# Patient Record
Sex: Female | Born: 1975 | Race: White | Hispanic: No | Smoking: Current every day smoker
Health system: Southern US, Community
[De-identification: ages and names within clinical notes are randomized; demographics above are authoritative.]

## PROBLEM LIST (undated history)

## (undated) DIAGNOSIS — E7800 Pure hypercholesterolemia, unspecified: Secondary | ICD-10-CM

## (undated) DIAGNOSIS — F419 Anxiety disorder, unspecified: Secondary | ICD-10-CM

## (undated) DIAGNOSIS — F32A Depression, unspecified: Secondary | ICD-10-CM

## (undated) DIAGNOSIS — C539 Malignant neoplasm of cervix uteri, unspecified: Secondary | ICD-10-CM

## (undated) DIAGNOSIS — Z87442 Personal history of urinary calculi: Secondary | ICD-10-CM

## (undated) DIAGNOSIS — T7840XA Allergy, unspecified, initial encounter: Secondary | ICD-10-CM

## (undated) HISTORY — PX: BREAST BIOPSY: SHX20

## (undated) HISTORY — PX: ABDOMINAL HYSTERECTOMY: SHX81

## (undated) HISTORY — PX: ECTOPIC PREGNANCY SURGERY: SHX613

## (undated) HISTORY — PX: SPINE SURGERY: SHX786

## (undated) HISTORY — PX: TUBAL LIGATION: SHX77

## (undated) HISTORY — PX: LEEP: SHX91

---
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
## 1997-08-07 ENCOUNTER — Inpatient Hospital Stay (HOSPITAL_COMMUNITY): Admission: AD | Admit: 1997-08-07 | Discharge: 1997-08-07 | Payer: Self-pay | Admitting: *Deleted

## 1997-09-30 ENCOUNTER — Inpatient Hospital Stay (HOSPITAL_COMMUNITY): Admission: AD | Admit: 1997-09-30 | Discharge: 1997-10-02 | Payer: Self-pay | Admitting: *Deleted

## 1997-11-12 ENCOUNTER — Encounter: Admission: RE | Admit: 1997-11-12 | Discharge: 1997-11-12 | Payer: Self-pay | Admitting: Family Medicine

## 1997-11-29 ENCOUNTER — Encounter: Admission: RE | Admit: 1997-11-29 | Discharge: 1997-11-29 | Payer: Self-pay | Admitting: Family Medicine

## 1998-01-30 ENCOUNTER — Encounter: Admission: RE | Admit: 1998-01-30 | Discharge: 1998-01-30 | Payer: Self-pay | Admitting: Family Medicine

## 1998-04-02 ENCOUNTER — Other Ambulatory Visit: Admission: RE | Admit: 1998-04-02 | Discharge: 1998-04-02 | Payer: Self-pay | Admitting: *Deleted

## 1998-04-02 ENCOUNTER — Encounter: Admission: RE | Admit: 1998-04-02 | Discharge: 1998-04-02 | Payer: Self-pay | Admitting: Sports Medicine

## 2005-07-06 HISTORY — PX: DIAGNOSTIC LAPAROSCOPY: SUR761

## 2006-02-19 ENCOUNTER — Emergency Department (HOSPITAL_COMMUNITY): Admission: EM | Admit: 2006-02-19 | Discharge: 2006-02-19 | Payer: Self-pay

## 2006-03-12 ENCOUNTER — Ambulatory Visit (HOSPITAL_COMMUNITY): Admission: AD | Admit: 2006-03-12 | Discharge: 2006-03-12 | Payer: Self-pay | Admitting: Obstetrics and Gynecology

## 2006-03-12 ENCOUNTER — Encounter: Payer: Self-pay | Admitting: Emergency Medicine

## 2006-03-12 ENCOUNTER — Encounter (INDEPENDENT_AMBULATORY_CARE_PROVIDER_SITE_OTHER): Payer: Self-pay | Admitting: Specialist

## 2007-11-26 ENCOUNTER — Inpatient Hospital Stay (HOSPITAL_COMMUNITY): Admission: AD | Admit: 2007-11-26 | Discharge: 2007-11-26 | Payer: Self-pay | Admitting: Obstetrics & Gynecology

## 2008-06-07 ENCOUNTER — Ambulatory Visit (HOSPITAL_COMMUNITY): Admission: RE | Admit: 2008-06-07 | Discharge: 2008-06-07 | Payer: Self-pay | Admitting: Obstetrics and Gynecology

## 2008-07-03 ENCOUNTER — Encounter (INDEPENDENT_AMBULATORY_CARE_PROVIDER_SITE_OTHER): Payer: Self-pay | Admitting: Obstetrics and Gynecology

## 2008-07-03 ENCOUNTER — Inpatient Hospital Stay (HOSPITAL_COMMUNITY): Admission: AD | Admit: 2008-07-03 | Discharge: 2008-07-04 | Payer: Self-pay | Admitting: Obstetrics and Gynecology

## 2008-07-06 HISTORY — PX: ESSURE TUBAL LIGATION: SUR464

## 2008-09-29 ENCOUNTER — Emergency Department (HOSPITAL_COMMUNITY): Admission: EM | Admit: 2008-09-29 | Discharge: 2008-09-29 | Payer: Self-pay | Admitting: Family Medicine

## 2009-01-10 ENCOUNTER — Inpatient Hospital Stay (HOSPITAL_COMMUNITY): Admission: AD | Admit: 2009-01-10 | Discharge: 2009-01-11 | Payer: Self-pay | Admitting: Obstetrics and Gynecology

## 2010-02-26 ENCOUNTER — Other Ambulatory Visit: Admission: RE | Admit: 2010-02-26 | Discharge: 2010-02-26 | Payer: Self-pay | Admitting: Obstetrics and Gynecology

## 2010-02-26 ENCOUNTER — Ambulatory Visit: Payer: Self-pay | Admitting: Obstetrics & Gynecology

## 2010-03-19 ENCOUNTER — Ambulatory Visit: Payer: Self-pay | Admitting: Obstetrics & Gynecology

## 2010-04-09 ENCOUNTER — Other Ambulatory Visit: Admission: RE | Admit: 2010-04-09 | Discharge: 2010-04-09 | Payer: Self-pay | Admitting: Obstetrics and Gynecology

## 2010-04-09 ENCOUNTER — Ambulatory Visit: Payer: Self-pay | Admitting: Obstetrics & Gynecology

## 2010-04-23 ENCOUNTER — Emergency Department (HOSPITAL_COMMUNITY): Admission: EM | Admit: 2010-04-23 | Discharge: 2010-04-23 | Payer: Self-pay | Admitting: Family Medicine

## 2010-04-30 ENCOUNTER — Ambulatory Visit: Payer: Self-pay | Admitting: Obstetrics and Gynecology

## 2010-05-11 ENCOUNTER — Emergency Department (HOSPITAL_COMMUNITY): Admission: EM | Admit: 2010-05-11 | Discharge: 2010-05-11 | Payer: Self-pay | Admitting: Emergency Medicine

## 2010-06-11 ENCOUNTER — Other Ambulatory Visit
Admission: RE | Admit: 2010-06-11 | Discharge: 2010-06-11 | Payer: Self-pay | Source: Home / Self Care | Admitting: Obstetrics and Gynecology

## 2010-06-11 ENCOUNTER — Ambulatory Visit: Payer: Self-pay | Admitting: Obstetrics and Gynecology

## 2010-07-09 ENCOUNTER — Ambulatory Visit: Admit: 2010-07-09 | Payer: Self-pay | Admitting: Obstetrics and Gynecology

## 2010-09-15 LAB — POCT PREGNANCY, URINE: Preg Test, Ur: NEGATIVE

## 2010-09-16 LAB — DIFFERENTIAL
Basophils Absolute: 0.1 10*3/uL (ref 0.0–0.1)
Basophils Relative: 1 % (ref 0–1)
Eosinophils Relative: 1 % (ref 0–5)
Lymphocytes Relative: 36 % (ref 12–46)
Neutro Abs: 4.9 10*3/uL (ref 1.7–7.7)

## 2010-09-16 LAB — COMPREHENSIVE METABOLIC PANEL
AST: 35 U/L (ref 0–37)
Alkaline Phosphatase: 86 U/L (ref 39–117)
BUN: 8 mg/dL (ref 6–23)
CO2: 25 mEq/L (ref 19–32)
Chloride: 107 mEq/L (ref 96–112)
Creatinine, Ser: 0.96 mg/dL (ref 0.4–1.2)
GFR calc non Af Amer: >60 mL/min (ref >60)
Potassium: 3.9 mEq/L (ref 3.5–5.1)
Total Bilirubin: 0.4 mg/dL (ref 0.3–1.2)

## 2010-09-16 LAB — CBC
HCT: 40.4 % (ref 36.0–46.0)
Hemoglobin: 13.7 g/dL (ref 12.0–15.0)
MCH: 29.5 pg (ref 26.0–34.0)
MCV: 87.3 fL (ref 78.0–100.0)
RBC: 4.63 MIL/uL (ref 3.87–5.11)
WBC: 9.2 10*3/uL (ref 4.0–10.5)

## 2010-09-16 LAB — URINALYSIS, ROUTINE W REFLEX MICROSCOPIC
Bilirubin Urine: NEGATIVE
Ketones, ur: NEGATIVE mg/dL
Nitrite: NEGATIVE
Protein, ur: NEGATIVE mg/dL
Specific Gravity, Urine: >1.03 — ABNORMAL HIGH (ref 1.005–1.030)
Urobilinogen, UA: 0.2 mg/dL (ref 0.0–1.0)

## 2010-09-16 LAB — URINE CULTURE: Colony Count: 25000

## 2010-09-16 LAB — PREGNANCY, URINE: Preg Test, Ur: NEGATIVE

## 2010-09-17 LAB — POCT RAPID STREP A (OFFICE): Streptococcus, Group A Screen (Direct): POSITIVE — AB

## 2010-10-12 LAB — CBC
Hemoglobin: 15.6 g/dL — ABNORMAL HIGH (ref 12.0–15.0)
MCHC: 34.5 g/dL (ref 30.0–36.0)
RBC: 5.09 MIL/uL (ref 3.87–5.11)
WBC: 11.7 10*3/uL — ABNORMAL HIGH (ref 4.0–10.5)

## 2010-10-12 LAB — WET PREP, GENITAL
Clue Cells Wet Prep HPF POC: NONE SEEN
Trich, Wet Prep: NONE SEEN
Yeast Wet Prep HPF POC: NONE SEEN

## 2010-10-12 LAB — POCT PREGNANCY, URINE: Preg Test, Ur: NEGATIVE

## 2010-10-12 LAB — GC/CHLAMYDIA PROBE AMP, GENITAL
Chlamydia, DNA Probe: NEGATIVE
GC Probe Amp, Genital: NEGATIVE

## 2010-11-21 NOTE — Op Note (Signed)
April Ayala, MARENGO             ACCOUNT NO.:  [id number]   MEDICAL RECORD NO.:  [id number]          PATIENT TYPE:  OIB   LOCATION:  9399                          FACILITY:  [facility]   PHYSICIAN:  Osborn Coho, M.D.   DATE OF BIRTH:  October 25, 1975   DATE OF PROCEDURE:  03/12/2006  DATE OF DISCHARGE:                                 OPERATIVE REPORT   PREOPERATIVE DIAGNOSIS:  Ruptured ectopic.   POSTOPERATIVE DIAGNOSIS:  Ruptured ectopic.   PROCEDURE:  Laparoscopic left salpingectomy.   ANESTHESIA:  General.   ATTENDING:  Dr. Osborn Coho.   FLUID:  3000 mL.   ESTIMATED BLOOD LOSS:  1000 mL of hemoperitoneum with clots and blood.   URINE OUTPUT:  450 mL.   COMPLICATIONS:  None.   SPECIMENS TO PATHOLOGY:  Left fallopian tube and ectopic pregnancy.   FINDINGS:  1000 mL hemoperitoneum.   PROCEDURE:  The patient is taken to the operating room after risks,  benefits, alternatives reviewed with the patient.  The patient verbalized  understanding consent signed and witnessed.  The patient was placed under  general anesthesia and prepped and draped in the normal sterile fashion.  A  bivalve speculum placed in the patient's vagina and Hulka tenaculum placed  for intrauterine manipulation.  Attention was then turned to the abdomen  where a 10 mm incision was made at the umbilicus.  The Veress needle was  passed into the intra-abdominal cavity and pneumoperitoneum achieved.  The  Veress needle was removed and 10 mm trocar advanced to the intra-abdominal  cavity without difficulty and laparoscope introduced.  A large  hemoperitoneum noted.  A left ectopic with ruptured left tube noted.  The  patient was placed in Trendelenburg and a left lower quadrant 10 mm incision  was made and 10 mm trocar advanced into the intra-abdominal cavity under  direct visualization.  The same was done on the right lower quadrant,  however a 5 mm incision and trocar was used.  The ectopic was elevated and  ligated with two Endoloops of 0 Vicryl.  The ectopic was then excised and  removed via the 10 mm umbilical port.  Copious irrigation was performed and  clot removed.  The patient was placed in reverse Trendelenburg as well in  order to remove the blood via suction irrigation.  There was good hemostasis  of the fallopian tube, however the fimbria was separated from the stump  where the ectopic pregnancy was removed secondary to rupture of the tube at  this area.  The fimbriated end was then ligated using a 0 Vicryl Endoloop  incorporating some of the mesosalpinx in order to prevent bleeding.  The  pneumoperitoneum was relieved some in order to assess the pedicles which  were noted to be hemostatic.  Copious irrigation was performed once again  and hemostasis of the pedicles was noted.  The left lower quadrant port and  right lower quadrant ports were removed under direct visualization while  pneumoperitoneum was being relieved.  The 10 mm umbilical port was removed  under direct visualization as well.  The fascia at the 10 mm  port sites was  repaired with 0 Vicryl via a figure-of-eight stitch.  The skin was repaired  at the 10 mm skin incisions with 3-0 Monocryl via a subcuticular stitch and  the 5 mm incision was repaired with Dermabond.  The Hulka tenaculum was  removed.  Sponge, lap and needle count was correct.  The patient tolerated  procedure well and is currently awaiting transfer to the recovery room in  good condition.      Osborn Coho, M.D.  Electronically Signed     AR/MEDQ  D:  03/12/2006  T:  03/12/2006  Job:  [id number]

## 2010-11-21 NOTE — Discharge Summary (Signed)
[name], April Ayala           ACCOUNT NO.:  [id number]   MEDICAL RECORD NO.:  [id number]          PATIENT TYPE:  INP   LOCATION:  9141                          FACILITY:  [facility]   PHYSICIAN:  Huel Cote, M.D. DATE OF BIRTH:  1976-05-21   DATE OF ADMISSION:  07/03/2008  DATE OF DISCHARGE:  07/04/2008                               DISCHARGE SUMMARY   DISCHARGE DIAGNOSES:  1. Term pregnancy at 38 weeks, delivered.  2. Status post precipitous vaginal delivery.   DISCHARGE MEDICATIONS:  1. Motrin 600 mg p.o. every 6 hours.  2. Percocet 150 tablets p.o. every 4 hours p.r.n.   HOSPITAL COURSE:  The patient is to follow up in 6 weeks for her routine  postpartum exam.   HOSPITAL COURSE:  The patient is a 35 year old G6, P3-0-2-3 who was  admitted at 44 weeks' gestation with spontaneous rupture of membranes  and active labor.  When she arrived on admission, she was 4-5 cm  dilated, received her epidural, and progressed quickly to complete.  Prenatal care was complicated by polyhydramnios, has no clear etiology.  She also had recurrent UTIs with Klebsiella.   PRENATAL LABS:  An O negative, antibody negative, RPR nonreactive,  rubella immune, hepatitis B surface antigen negative, HIV negative, GC  negative, Chlamydia negative, group B strep negative, 1-hour Glucola  124.  She declined genetic screening.   PAST OBSTETRICAL HISTORY:  In 1999, she had a vaginal delivery of 6-  pound 10-ounce infant.  In 2001, she had a vaginal delivery of an 8-  pound 4-ounce infant and in 2003, she had a vaginal delivery of an 8-  pound 6-ounce infant.  In 2000, she suffered a miscarriage and in 2008,  had a left ectopic pregnancy.   PAST GYN HISTORY:  She had a history of a salpingectomy on the left in  2008 for her ectopic.   PAST SURGICAL HISTORY:  She had a history of a salpingectomy on the left  in 2008 for her ectopic.   PAST MEDICAL HISTORY:  None.   ALLERGIES:  None.   MEDICATIONS:   Prenatal vitamins only.   PHYSICAL EXAMINATION:  VITAL SIGNS:  On admission, she was afebrile with  stable vital signs.  PELVIC:  Fetal heart rate had mild variable decelerations, but was  overall reassuring.   Shortly after admission, she was found to be completely dilated and was  prepped and pushed well, bringing the vertex to the perineum.  Over  several contractions, she delivered the vertex with some effort.  A  moderate shoulder dystocia was encountered and required McRoberts and  posterior axillary lift and suprapubic pressure.  She had a viable  female infant, Apgars were 3 and 9, weight was 7 pounds 9 ounces.  The  baby was very floppy at delivery and the shoulder dystocia lasted  approximately 1 minute.  Cord pH was 7.11.  NICU was called and Dr.  Noreene Filbert evaluated the baby and took observation given mild retraction.  The baby was moving both arms well and the placenta was delivered  spontaneously.  There were no lacerations of the vagina.  The  patient  then was admitted for routine postpartum care.  Her hemoglobin was 9.7.  The baby did quite well and was able to be discharged home on the day  following birth.  Therefore, the patient requested early discharge and  was allowed this with followup in the office in 6 weeks.      Huel Cote, M.D.  Electronically Signed     KR/MEDQ  D:  08/04/2008  T:  08/04/2008  Job:  [id number]

## 2011-02-10 DIAGNOSIS — T169XXA Foreign body in ear, unspecified ear, initial encounter: Secondary | ICD-10-CM | POA: Insufficient documentation

## 2011-02-10 DIAGNOSIS — IMO0002 Reserved for concepts with insufficient information to code with codable children: Secondary | ICD-10-CM | POA: Insufficient documentation

## 2011-02-10 DIAGNOSIS — F17200 Nicotine dependence, unspecified, uncomplicated: Secondary | ICD-10-CM | POA: Insufficient documentation

## 2011-02-11 ENCOUNTER — Encounter: Payer: Self-pay | Admitting: *Deleted

## 2011-02-11 ENCOUNTER — Emergency Department (HOSPITAL_COMMUNITY)
Admission: EM | Admit: 2011-02-11 | Discharge: 2011-02-11 | Disposition: A | Discharge status: Home / Self Care | Payer: PRIVATE HEALTH INSURANCE | Attending: Emergency Medicine | Admitting: Emergency Medicine

## 2011-02-11 DIAGNOSIS — T169XXA Foreign body in ear, unspecified ear, initial encounter: Secondary | ICD-10-CM

## 2011-02-11 MED ORDER — ANTIPYRINE-BENZOCAINE 5.4-1.4 % OT SOLN
3.0000 [drp] | Freq: Once | OTIC | Status: AC
  Administered 2011-02-11: 4 [drp] via OTIC
  Filled 2011-02-11: qty 10

## 2011-02-11 MED ORDER — LIDOCAINE HCL (PF) 1 % IJ SOLN
5.0000 mL | Freq: Once | INTRAMUSCULAR | Status: AC
  Administered 2011-02-11: 5 mL
  Filled 2011-02-11 (×2): qty 5

## 2011-02-11 MED ORDER — CIPROFLOXACIN-DEXAMETHASONE 0.3-0.1 % OT SUSP: 4.0000 [drp] | Freq: Two times a day (BID) | OTIC | Status: AC

## 2011-02-11 NOTE — ED Notes (Signed)
Foreign body to right ear, moth in ear, attempted to remove moth but unable to

## 2011-02-11 NOTE — ED Notes (Signed)
Pt right ear flushed no return of particles resembling a bug noted pt stating pain in ear with continued flushing. Flushing stopped dr notified ear canal reddened.

## 2011-02-11 NOTE — ED Provider Notes (Signed)
History     CSN: [id number] Arrival date & time: 02/11/2011  1:15 AM  Chief Complaint  Patient presents with  . Foreign Body in Ear   HPI Comments: He had a bug fly into her right ear approximately 3 hours prior to arrival. The symptoms are constant, gradually improving, not associated with headache. She does have mild earache and vertigo. She tried to get the decrease round with a pair of forceps prior to arrival.  Patient is a 35 y.o. female presenting with foreign body in ear. The history is provided by the patient.  Foreign Body in Ear This is a new problem. The current episode started 1 to 2 hours ago. The problem occurs constantly. The problem has been gradually improving. Pertinent negatives include no headaches.    History reviewed. No pertinent past medical history.  Past Surgical History  Procedure Date  . Ectopic pregnancy surgery   . Essure tubal ligation   . Leep     History reviewed. No pertinent family history.  History  Substance Use Topics  . Smoking status: Current Everyday Smoker -- 0.5 packs/day    Types: Cigarettes  . Smokeless tobacco: Not on file  . Alcohol Use: No    OB History    Grav Para Term Preterm Abortions TAB SAB Ect Mult Living                  Review of Systems  HENT: Positive for ear pain.   Neurological: Positive for dizziness. Negative for light-headedness and headaches.    Physical Exam  BP 124/89  Pulse 87  Temp(Src) 98.4 F (36.9 C) (Oral)  Resp 18  Ht 5\' 3"  (1.6 m)  Wt 189 lb (85.73 kg)  BMI 33.48 kg/m2  SpO2 100%  LMP 01/30/2011  Physical Exam  Constitutional: She appears well-developed and well-nourished. No distress.  HENT:  Head: Normocephalic and atraumatic.  Left Ear: External ear normal.  Mouth/Throat: Oropharynx is clear and moist. No oropharyngeal exudate.       Right external ear containing cerumen and an insect.  Eyes: Conjunctivae are normal. No scleral icterus.  Cardiovascular: Normal rate and normal  heart sounds.   Pulmonary/Chest: Effort normal and breath sounds normal.  Musculoskeletal: Normal range of motion. She exhibits no edema and no tenderness.  Neurological: She is alert.  Skin: Skin is warm and dry. No rash noted. She is not diaphoretic.    ED Course  FOREIGN BODY REMOVAL Date/Time: 02/11/2011 4:00 AM Performed by: Eber Hong D Authorized by: Eber Hong D Consent: Verbal consent obtained. Risks and benefits: risks, benefits and alternatives were discussed Consent given by: patient Patient understanding: patient states understanding of the procedure being performed Patient identity confirmed: verbally with patient Body area: ear Location details: right ear Local anesthetic: topical lidocaine. Patient sedated: no Patient restrained: no Localization method: magnification Removal mechanism: alligator forceps Complexity: simple 1 objects recovered. Objects recovered: insect Post-procedure assessment: foreign body removed Patient tolerance: Patient tolerated the procedure well with no immediate complications.    MDM Patient will require manual extraction of insect. Topical lidocaine applied to the ear first. See note to follow.  Topical anesthetic and topical antibiotic drops given for home as patient did have small amount of bleeding with the procedure.  Vida Roller, MD 02/11/11 [phone]

## 2011-03-25 ENCOUNTER — Emergency Department (HOSPITAL_COMMUNITY)
Admission: EM | Admit: 2011-03-25 | Discharge: 2011-03-25 | Disposition: A | Discharge status: Home / Self Care | Payer: PRIVATE HEALTH INSURANCE | Attending: Emergency Medicine | Admitting: Emergency Medicine

## 2011-03-25 ENCOUNTER — Other Ambulatory Visit: Payer: Self-pay

## 2011-03-25 ENCOUNTER — Emergency Department (HOSPITAL_COMMUNITY): Payer: PRIVATE HEALTH INSURANCE

## 2011-03-25 ENCOUNTER — Encounter (HOSPITAL_COMMUNITY): Payer: Self-pay | Admitting: *Deleted

## 2011-03-25 DIAGNOSIS — F17200 Nicotine dependence, unspecified, uncomplicated: Secondary | ICD-10-CM | POA: Insufficient documentation

## 2011-03-25 DIAGNOSIS — N390 Urinary tract infection, site not specified: Secondary | ICD-10-CM

## 2011-03-25 DIAGNOSIS — R109 Unspecified abdominal pain: Secondary | ICD-10-CM | POA: Insufficient documentation

## 2011-03-25 DIAGNOSIS — R0789 Other chest pain: Secondary | ICD-10-CM | POA: Insufficient documentation

## 2011-03-25 LAB — URINE MICROSCOPIC-ADD ON

## 2011-03-25 LAB — URINALYSIS, ROUTINE W REFLEX MICROSCOPIC
Hgb urine dipstick: NEGATIVE
Nitrite: NEGATIVE
Specific Gravity, Urine: 1.01 (ref 1.005–1.030)
Urobilinogen, UA: 0.2 mg/dL (ref 0.0–1.0)
pH: 7 (ref 5.0–8.0)

## 2011-03-25 LAB — DIFFERENTIAL
Lymphocytes Relative: 34 % (ref 12–46)
Lymphs Abs: 4.7 10*3/uL — ABNORMAL HIGH (ref 0.7–4.0)
Neutrophils Relative %: 57 % (ref 43–77)

## 2011-03-25 LAB — BASIC METABOLIC PANEL
CO2: 29 mEq/L (ref 19–32)
Glucose, Bld: 107 mg/dL — ABNORMAL HIGH (ref 70–99)
Potassium: 3.8 mEq/L (ref 3.5–5.1)
Sodium: 139 mEq/L (ref 135–145)

## 2011-03-25 LAB — CBC
Hemoglobin: 14.1 g/dL (ref 12.0–15.0)
MCV: 86.9 fL (ref 78.0–100.0)
Platelets: 360 10*3/uL (ref 150–400)
RBC: 4.81 MIL/uL (ref 3.87–5.11)
WBC: 13.8 10*3/uL — ABNORMAL HIGH (ref 4.0–10.5)

## 2011-03-25 LAB — CARDIAC PANEL(CRET KIN+CKTOT+MB+TROPI)
CK, MB: 1.7 ng/mL (ref 0.3–4.0)
Relative Index: INVALID (ref 0.0–2.5)
Troponin I: <0.3 ng/mL (ref <0.30)

## 2011-03-25 MED ORDER — CIPROFLOXACIN HCL 250 MG PO TABS
500.0000 mg | ORAL_TABLET | Freq: Once | ORAL | Status: AC
  Administered 2011-03-25: 500 mg via ORAL
  Filled 2011-03-25: qty 2

## 2011-03-25 MED ORDER — CIPROFLOXACIN HCL 500 MG PO TABS: 500.0000 mg | ORAL_TABLET | Freq: Two times a day (BID) | ORAL | Status: AC

## 2011-03-25 MED ORDER — SODIUM CHLORIDE 0.9 % IV SOLN
Freq: Once | INTRAVENOUS | Status: AC
  Administered 2011-03-25: 05:00:00 via INTRAVENOUS

## 2011-03-25 NOTE — ED Provider Notes (Addendum)
History     CSN: [id number] Arrival date & time: 03/25/2011  4:09 AM   Chief Complaint  Patient presents with  . Abdominal Pain  . Chest Pain     (Include location/radiation/quality/duration/timing/severity/associated sxs/prior treatment) HPI Comments: Seen 24  Patient is a 35 y.o. female presenting with abdominal pain and chest pain. The history is provided by the patient.  Abdominal Pain The primary symptoms of the illness include abdominal pain. The primary symptoms of the illness do not include nausea, vomiting or dysuria. Primary symptoms comment: Began with abdominal pain yesterday.Pain has been in upper abdomen as well as lower abdomen.  The onset of the illness was sudden. The problem has not changed since onset. The patient states that she believes she is currently not pregnant. The patient has not had a change in bowel habit. Symptoms associated with the illness do not include chills, anorexia, constipation, urgency, hematuria, frequency or back pain. Associated symptoms comments: Has had left sided chest pain which radiates down her arm.  Chest Pain Primary symptoms include abdominal pain. Pertinent negatives for primary symptoms include no nausea and no vomiting. Primary symptoms comment: Began with abdominal pain yesterday.Pain has been in upper abdomen as well as lower abdomen.       History reviewed. No pertinent past medical history.   Past Surgical History  Procedure Date  . Ectopic pregnancy surgery   . Essure tubal ligation   . Leep     History reviewed. No pertinent family history.  History  Substance Use Topics  . Smoking status: Current Everyday Smoker -- 0.5 packs/day    Types: Cigarettes  . Smokeless tobacco: Not on file  . Alcohol Use: No    OB History    Grav Para Term Preterm Abortions TAB SAB Ect Mult Living                  Review of Systems  Constitutional: Negative for chills.  Cardiovascular: Positive for chest pain.    Gastrointestinal: Positive for abdominal pain. Negative for nausea, vomiting, constipation and anorexia.  Genitourinary: Negative for dysuria, urgency, frequency and hematuria.  Musculoskeletal: Negative for back pain.  All other systems reviewed and are negative.    Allergies  Review of patient's allergies indicates no known allergies.  Home Medications   Current Outpatient Rx  Name Route Sig Dispense Refill  . OXYCODONE-ACETAMINOPHEN 5-325 MG PO TABS Oral Take 1 tablet by mouth every 4 (four) hours as needed.        Physical Exam    BP 122/81  Pulse 112  Temp(Src) 98.9 F (37.2 C) (Oral)  Resp 20  Ht 5\' 3"  (1.6 m)  Wt 180 lb (81.647 kg)  BMI 31.89 kg/m2  SpO2 100%  LMP 03/16/2011  Physical Exam  Nursing note and vitals reviewed. Constitutional: She is oriented to person, place, and time. She appears well-developed and well-nourished. No distress.  HENT:  Head: Normocephalic and atraumatic.  Eyes: EOM are normal.  Neck: Normal range of motion. Neck supple.  Cardiovascular: Normal rate, normal heart sounds and intact distal pulses.   Pulmonary/Chest: Effort normal and breath sounds normal. She exhibits no tenderness.  Abdominal: Soft. There is no rebound and no guarding.       Mild suprapubic discomfort with palpation  Musculoskeletal: Normal range of motion.       FROM to left shoulder without pain.  Neurological: She is alert and oriented to person, place, and time.  Skin: Skin is warm and dry.  ED Course  Procedures  Results for orders placed during the hospital encounter of 03/25/11  URINALYSIS, ROUTINE W REFLEX MICROSCOPIC      Component Value Range   Color, Urine YELLOW  YELLOW    Appearance CLOUDY (*) CLEAR    Specific Gravity, Urine 1.010  1.005 - 1.030    pH 7.0  5.0 - 8.0    Glucose, UA NEGATIVE  NEGATIVE (mg/dL)   Hgb urine dipstick NEGATIVE  NEGATIVE    Bilirubin Urine NEGATIVE  NEGATIVE    Ketones, ur NEGATIVE  NEGATIVE (mg/dL)   Protein,  ur NEGATIVE  NEGATIVE (mg/dL)   Urobilinogen, UA 0.2  0.0 - 1.0 (mg/dL)   Nitrite NEGATIVE  NEGATIVE    Leukocytes, UA LARGE (*) NEGATIVE   CBC      Component Value Range   WBC 13.8 (*) 4.0 - 10.5 (K/uL)   RBC 4.81  3.87 - 5.11 (MIL/uL)   Hemoglobin 14.1  12.0 - 15.0 (g/dL)   HCT 16.1  09.6 - 04.5 (%)   MCV 86.9  78.0 - 100.0 (fL)   MCH 29.3  26.0 - 34.0 (pg)   MCHC 33.7  30.0 - 36.0 (g/dL)   RDW 40.9  81.1 - 91.4 (%)   Platelets 360  150 - 400 (K/uL)  DIFFERENTIAL      Component Value Range   Neutrophils Relative 57  43 - 77 (%)   Neutro Abs 7.8 (*) 1.7 - 7.7 (K/uL)   Lymphocytes Relative 34  12 - 46 (%)   Lymphs Abs 4.7 (*) 0.7 - 4.0 (K/uL)   Monocytes Relative 6  3 - 12 (%)   Monocytes Absolute 0.9  0.1 - 1.0 (K/uL)   Eosinophils Relative 2  0 - 5 (%)   Eosinophils Absolute 0.3  0.0 - 0.7 (K/uL)   Basophils Relative 0  0 - 1 (%)   Basophils Absolute 0.1  0.0 - 0.1 (K/uL)  BASIC METABOLIC PANEL      Component Value Range   Sodium 139  135 - 145 (mEq/L)   Potassium 3.8  3.5 - 5.1 (mEq/L)   Chloride 99  96 - 112 (mEq/L)   CO2 29  19 - 32 (mEq/L)   Glucose, Bld 107 (*) 70 - 99 (mg/dL)   BUN 8  6 - 23 (mg/dL)   Creatinine, Ser 7.82  0.50 - 1.10 (mg/dL)   Calcium 9.6  8.4 - 95.6 (mg/dL)   GFR calc non Af Amer >60  >60 (mL/min)   GFR calc Af Amer >60  >60 (mL/min)  CARDIAC PANEL(CRET KIN+CKTOT+MB+TROPI)      Component Value Range   Total CK 53  7 - 177 (U/L)   CK, MB 1.7  0.3 - 4.0 (ng/mL)   Troponin I <0.30  <0.30 (ng/mL)   Relative Index RELATIVE INDEX IS INVALID  0.0 - 2.5   URINE MICROSCOPIC-ADD ON      Component Value Range   Squamous Epithelial / LPF MANY (*) RARE    WBC, UA 21-50  <3 (WBC/hpf)   RBC / HPF 11-20  <3 (RBC/hpf)   Bacteria, UA MANY (*) RARE    Dg Chest 2 View  03/25/2011  *RADIOLOGY REPORT*  Clinical Data: Right anterior chest pain  CHEST - 2 VIEW  Comparison: None.  Findings: Lungs are clear. No pleural effusion or pneumothorax. The  cardiomediastinal contours are within normal limits. The visualized bones and soft tissues are without significant appreciable abnormality.  IMPRESSION: No acute cardiopulmonary process.  Original Report Authenticated By: Waneta Martins, M.D.   Patient with both abdominal pain and chest discomfort. No fevers, chills, shortness of breath, nausea, vomiting. Labs significant for UTI. Chest xray, EKG and cardiac markers negative. Patient informed of clinical course, understand medical decision-making process, and agree with plan.Pt stable in ED with no significant deterioration in condition. MDM Reviewed: nursing note and vitals Interpretation: labs, x-ray and ECG   Date: 03/25/2011  0419  Rate:99  Rhythm: normal sinus rhythm  QRS Axis: normal  Intervals: normal  ST/T Wave abnormalities: normal  Conduction Disutrbances:none  Narrative Interpretation:   Old EKG Reviewed: none available         Nicoletta Dress. Colon Branch, MD 03/25/11 [id number]  Nicoletta Dress. Colon Branch, MD 03/25/11 [phone]

## 2011-03-25 NOTE — ED Notes (Signed)
Pt reports upper abdominal pain & left sided chest pain. Chest pain comes & goes but will radiate to left arm. Abdominal pain more constate & a stabbing pain.

## 2011-03-25 NOTE — ED Notes (Signed)
Pt left er stating no needs 

## 2011-04-01 LAB — URINALYSIS, ROUTINE W REFLEX MICROSCOPIC
Bilirubin Urine: NEGATIVE
Glucose, UA: NEGATIVE
Ketones, ur: 15 — AB
pH: 7

## 2011-04-01 LAB — HCG, QUANTITATIVE, PREGNANCY: hCG, Beta Chain, Quant, S: 49907 — ABNORMAL HIGH

## 2011-04-01 LAB — WET PREP, GENITAL
Clue Cells Wet Prep HPF POC: NONE SEEN
Yeast Wet Prep HPF POC: NONE SEEN

## 2011-04-06 DIAGNOSIS — C539 Malignant neoplasm of cervix uteri, unspecified: Secondary | ICD-10-CM

## 2011-04-06 HISTORY — DX: Malignant neoplasm of cervix uteri, unspecified: C53.9

## 2011-04-10 LAB — CBC
HCT: 29 % — ABNORMAL LOW (ref 36.0–46.0)
MCHC: 34.3 g/dL (ref 30.0–36.0)
MCV: 91.8 fL (ref 78.0–100.0)
MCV: 94 fL (ref 78.0–100.0)
Platelets: 233 10*3/uL (ref 150–400)
RBC: 4.09 MIL/uL (ref 3.87–5.11)
RDW: 14.6 % (ref 11.5–15.5)

## 2011-04-10 LAB — RPR: RPR Ser Ql: NONREACTIVE

## 2011-04-10 LAB — RH IMMUNE GLOB WKUP(>/=20WKS)(NOT WOMEN'S HOSP)

## 2011-05-15 ENCOUNTER — Other Ambulatory Visit (HOSPITAL_COMMUNITY): Payer: Self-pay | Admitting: Family Medicine

## 2011-05-15 DIAGNOSIS — IMO0002 Reserved for concepts with insufficient information to code with codable children: Secondary | ICD-10-CM

## 2011-05-21 ENCOUNTER — Other Ambulatory Visit (HOSPITAL_COMMUNITY): Payer: Self-pay | Admitting: Family Medicine

## 2011-05-21 ENCOUNTER — Ambulatory Visit (HOSPITAL_COMMUNITY)
Admission: RE | Admit: 2011-05-21 | Discharge: 2011-05-21 | Disposition: A | Discharge status: Home / Self Care | Payer: Self-pay | Source: Ambulatory Visit | Attending: Family Medicine | Admitting: Family Medicine

## 2011-05-21 ENCOUNTER — Ambulatory Visit (HOSPITAL_COMMUNITY): Payer: Self-pay

## 2011-05-21 DIAGNOSIS — R9389 Abnormal findings on diagnostic imaging of other specified body structures: Secondary | ICD-10-CM | POA: Insufficient documentation

## 2011-05-21 DIAGNOSIS — R87619 Unspecified abnormal cytological findings in specimens from cervix uteri: Secondary | ICD-10-CM | POA: Insufficient documentation

## 2011-05-21 DIAGNOSIS — IMO0002 Reserved for concepts with insufficient information to code with codable children: Secondary | ICD-10-CM

## 2011-07-21 ENCOUNTER — Other Ambulatory Visit (HOSPITAL_COMMUNITY)
Admission: RE | Admit: 2011-07-21 | Discharge: 2011-07-21 | Disposition: A | Discharge status: Home / Self Care | Payer: Medicaid Other | Source: Ambulatory Visit | Attending: Unknown Physician Specialty | Admitting: Unknown Physician Specialty

## 2011-07-21 ENCOUNTER — Other Ambulatory Visit: Payer: Self-pay | Admitting: Nurse Practitioner

## 2011-07-21 ENCOUNTER — Other Ambulatory Visit (HOSPITAL_COMMUNITY)
Admission: RE | Admit: 2011-07-21 | Discharge: 2011-07-21 | Disposition: A | Discharge status: Home / Self Care | Payer: Medicaid Other | Source: Ambulatory Visit | Attending: Family Medicine | Admitting: Family Medicine

## 2011-07-21 DIAGNOSIS — R87623 High grade squamous intraepithelial lesion on cytologic smear of vagina (HGSIL): Secondary | ICD-10-CM | POA: Insufficient documentation

## 2011-07-21 DIAGNOSIS — N870 Mild cervical dysplasia: Secondary | ICD-10-CM | POA: Insufficient documentation

## 2011-07-21 DIAGNOSIS — N72 Inflammatory disease of cervix uteri: Secondary | ICD-10-CM | POA: Insufficient documentation

## 2011-12-02 ENCOUNTER — Encounter: Payer: Self-pay | Admitting: Family Medicine

## 2012-01-11 ENCOUNTER — Encounter (HOSPITAL_COMMUNITY): Payer: Self-pay | Admitting: *Deleted

## 2012-01-11 ENCOUNTER — Emergency Department (HOSPITAL_COMMUNITY)
Admission: EM | Admit: 2012-01-11 | Discharge: 2012-01-11 | Disposition: A | Discharge status: Home / Self Care | Payer: BC Managed Care – PPO | Attending: Emergency Medicine | Admitting: Emergency Medicine

## 2012-01-11 DIAGNOSIS — N390 Urinary tract infection, site not specified: Secondary | ICD-10-CM

## 2012-01-11 DIAGNOSIS — R109 Unspecified abdominal pain: Secondary | ICD-10-CM | POA: Insufficient documentation

## 2012-01-11 DIAGNOSIS — F17200 Nicotine dependence, unspecified, uncomplicated: Secondary | ICD-10-CM | POA: Insufficient documentation

## 2012-01-11 DIAGNOSIS — M545 Low back pain, unspecified: Secondary | ICD-10-CM | POA: Insufficient documentation

## 2012-01-11 DIAGNOSIS — R300 Dysuria: Secondary | ICD-10-CM | POA: Insufficient documentation

## 2012-01-11 LAB — URINE MICROSCOPIC-ADD ON

## 2012-01-11 LAB — URINALYSIS, ROUTINE W REFLEX MICROSCOPIC
Bilirubin Urine: NEGATIVE
Glucose, UA: NEGATIVE mg/dL
Ketones, ur: NEGATIVE mg/dL
Protein, ur: 30 mg/dL — AB
pH: 7 (ref 5.0–8.0)

## 2012-01-11 MED ORDER — KETOROLAC TROMETHAMINE 60 MG/2ML IM SOLN
60.0000 mg | Freq: Once | INTRAMUSCULAR | Status: AC
  Administered 2012-01-11: 60 mg via INTRAMUSCULAR
  Filled 2012-01-11: qty 2

## 2012-01-11 MED ORDER — SULFAMETHOXAZOLE-TMP DS 800-160 MG PO TABS
1.0000 | ORAL_TABLET | Freq: Once | ORAL | Status: AC
  Administered 2012-01-11: 1 via ORAL
  Filled 2012-01-11: qty 1

## 2012-01-11 MED ORDER — NAPROXEN 500 MG PO TABS: 500.0000 mg | ORAL_TABLET | Freq: Two times a day (BID) | ORAL | Status: DC

## 2012-01-11 MED ORDER — SULFAMETHOXAZOLE-TRIMETHOPRIM 800-160 MG PO TABS: 1.0000 | ORAL_TABLET | Freq: Two times a day (BID) | ORAL | Status: AC

## 2012-01-11 MED ORDER — TRAMADOL HCL 50 MG PO TABS: 50.0000 mg | ORAL_TABLET | Freq: Four times a day (QID) | ORAL | Status: AC | PRN

## 2012-01-11 NOTE — ED Notes (Signed)
Pt c/o lower abdominal pain and back pain. Pt c/o pressure when urinating and occasional dysuria x 3 days.

## 2012-01-11 NOTE — ED Provider Notes (Signed)
History     CSN: [id number]  Arrival date & time 01/11/12  0415   First MD Initiated Contact with Patient 01/11/12 [phone]      Chief Complaint  Patient presents with  . Abdominal Pain  . Dysuria    (Consider location/radiation/quality/duration/timing/severity/associated sxs/prior treatment) HPI Comments: Gradual onset of SP abd pain which is gradually getting worse.  Pain is pressure in that  Area.  Laying and walking make it worse.  Has some pressure with urinating.  No change in bowel habits.  Radiates to both sides and lower back.  No upper abd pain.  Has had mild vag bleeding.  LMP was just on last week.  No dc  PMH:  Ectopic pregnancy, BTL.   PSH:  Surgery - for ectopic.  Pain is currently 8/10.  No meds pta.    Patient is a 36 y.o. female presenting with abdominal pain and dysuria. The history is provided by the patient.  Abdominal Pain The primary symptoms of the illness include abdominal pain and dysuria. The primary symptoms of the illness do not include fever, shortness of breath, nausea or vomiting.  Additional symptoms associated with the illness include chills.  Dysuria  Associated symptoms include chills. Pertinent negatives include no nausea and no vomiting.    History reviewed. No pertinent past medical history.  Past Surgical History  Procedure Date  . Ectopic pregnancy surgery   . Essure tubal ligation   . Leep     History reviewed. No pertinent family history.  History  Substance Use Topics  . Smoking status: Current Everyday Smoker -- 0.5 packs/day    Types: Cigarettes  . Smokeless tobacco: Not on file  . Alcohol Use: No    OB History    Grav Para Term Preterm Abortions TAB SAB Ect Mult Living                  Review of Systems  Constitutional: Positive for chills. Negative for fever.  HENT: Negative for sore throat.   Eyes: Negative for visual disturbance.  Respiratory: Negative for cough and shortness of breath.   Cardiovascular:  Negative for chest pain and leg swelling.  Gastrointestinal: Positive for abdominal pain. Negative for nausea and vomiting.  Genitourinary: Positive for dysuria.  Musculoskeletal: Negative for joint swelling.  Skin: Negative for rash.  Neurological: Negative for headaches.  Hematological: Negative for adenopathy.    Allergies  Review of patient's allergies indicates no known allergies.  Home Medications   Current Outpatient Rx  Name Route Sig Dispense Refill  . NAPROXEN 500 MG PO TABS Oral Take 1 tablet (500 mg total) by mouth 2 (two) times daily with a meal. 30 tablet 0  . SULFAMETHOXAZOLE-TRIMETHOPRIM 800-160 MG PO TABS Oral Take 1 tablet by mouth every 12 (twelve) hours. 10 tablet 0  . TRAMADOL HCL 50 MG PO TABS Oral Take 1 tablet (50 mg total) by mouth every 6 (six) hours as needed for pain. 15 tablet 0    BP 124/90  Pulse 95  Temp 98.4 F (36.9 C)  Resp 20  Ht 5\' 3"  (1.6 m)  Wt 185 lb (83.915 kg)  BMI 32.77 kg/m2  SpO2 100%  LMP 01/03/2012  Physical Exam  Nursing note and vitals reviewed. Constitutional: She appears well-developed and well-nourished. No distress.  HENT:  Head: Normocephalic and atraumatic.  Mouth/Throat: Oropharynx is clear and moist. No oropharyngeal exudate.  Eyes: Conjunctivae and EOM are normal. Pupils are equal, round, and reactive to light. Right eye  exhibits no discharge. Left eye exhibits no discharge. No scleral icterus.  Neck: Normal range of motion. Neck supple. No JVD present. No thyromegaly present.  Cardiovascular: Normal rate, regular rhythm, normal heart sounds and intact distal pulses.  Exam reveals no gallop and no friction rub.   No murmur heard. Pulmonary/Chest: Effort normal and breath sounds normal. No respiratory distress. She has no wheezes. She has no rales.  Abdominal: Soft. Bowel sounds are normal. She exhibits no distension and no mass. There is tenderness ( sp ttp  and mild LLQ ttp.  non peritoneal, no RLQ or RUQ ttp.l  no  guarding.  norma BS).       No CVA ttp  Musculoskeletal: Normal range of motion. She exhibits no edema and no tenderness.  Lymphadenopathy:    She has no cervical adenopathy.  Neurological: She is alert. Coordination normal.  Skin: Skin is warm and dry. No rash noted. No erythema.  Psychiatric: She has a normal mood and affect. Her behavior is normal.    ED Course  Procedures (including critical care time)  Labs Reviewed  URINALYSIS, ROUTINE W REFLEX MICROSCOPIC - Abnormal; Notable for the following:    Specific Gravity, Urine <1.005 (*)     Hgb urine dipstick LARGE (*)     Protein, ur 30 (*)     Leukocytes, UA SMALL (*)     All other components within normal limits  URINE MICROSCOPIC-ADD ON - Abnormal; Notable for the following:    Bacteria, UA FEW (*)     All other components within normal limits  POCT PREGNANCY, URINE   No results found.   1. UTI (lower urinary tract infection)       MDM  The patient is overall well appearing with normal vital signs and an overall benign abdomen. Most of her tenderness is in the suprapubic area and in correlation with a urinalysis showing 21-50 white blood cells and bacteria this is likely a urinary tract infection. She has no pain in her right lower quadrant, no guarding and is non-peritoneal. I expressed to her the indications for return and she is expressed her understanding. Bactrim given prior to discharge, see followup prescriptions below.  Discharge Prescriptions include:  Naprosyn Ultram Bactrim         Vida Roller, MD 01/11/12 [phone]

## 2012-01-21 ENCOUNTER — Encounter: Payer: Medicaid Other | Admitting: Obstetrics & Gynecology

## 2012-03-04 ENCOUNTER — Ambulatory Visit (INDEPENDENT_AMBULATORY_CARE_PROVIDER_SITE_OTHER): Payer: BC Managed Care – PPO | Admitting: Obstetrics & Gynecology

## 2012-03-04 ENCOUNTER — Encounter: Payer: Self-pay | Admitting: Obstetrics & Gynecology

## 2012-03-04 VITALS — BP 120/83 | HR 79 | Temp 97.9°F | Ht 63.5 in | Wt 195.4 lb

## 2012-03-04 DIAGNOSIS — D069 Carcinoma in situ of cervix, unspecified: Secondary | ICD-10-CM | POA: Insufficient documentation

## 2012-03-04 DIAGNOSIS — Z01812 Encounter for preprocedural laboratory examination: Secondary | ICD-10-CM

## 2012-03-04 DIAGNOSIS — N879 Dysplasia of cervix uteri, unspecified: Secondary | ICD-10-CM | POA: Insufficient documentation

## 2012-03-04 DIAGNOSIS — Z3200 Encounter for pregnancy test, result unknown: Secondary | ICD-10-CM

## 2012-03-04 NOTE — Progress Notes (Signed)
Patient ID: April Ayala, female   DOB: [birth year], 36 y.o.   MRN: [id number]  Chief Complaint  Patient presents with  . Procedure    LEEP    HPI April Ayala is a 36 y.o. female.  Patient's last menstrual period was 02/18/2012. No obstetric history on file. Referred by [facility] 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 HPI  Indications: Pap smear on November 2012 showed: high-grade squamous intraepithelial neoplasia  (HGSIL-encompassing moderate and severe dysplasia). Previous colposcopy: CIN 1, CIN 3 and in . Prior cervical treatment: LEEP.  No past medical history on file.  Past Surgical History  Procedure Date  . Ectopic pregnancy surgery   . Essure tubal ligation   . Leep     No family history on file.  Social History History  Substance Use Topics  . Smoking status: Current Everyday Smoker -- 0.5 packs/day    Types: Cigarettes  . Smokeless tobacco: Not on file  . Alcohol Use: No    No Known Allergies  Current Outpatient Prescriptions  Medication Sig Dispense Refill  . naproxen (NAPROSYN) 500 MG tablet Take 1 tablet (500 mg total) by mouth 2 (two) times daily with a meal.  30 tablet  0    Review of Systems Review of Systems  Genitourinary: Negative for vaginal bleeding, vaginal discharge, menstrual problem and pelvic pain.    Blood pressure 120/83, pulse 79, temperature 97.9 F (36.6 C), temperature source Oral, height 5' 3.5" (1.613 m), weight 195 lb 6.4 oz (88.633 kg), last menstrual period 02/18/2012.  Physical Exam Physical Exam  Constitutional: She appears well-developed. No distress.  Genitourinary: Vagina normal.       Cervix grossly normal post LEEP    Data Reviewed Biopsy result, notes  Assessment    Procedure Details  The risks and benefits of the procedure and Written informed consent obtained.  Speculum placed in vagina and excellent  visualization of cervix achieved, cervix swabbed x 3 with acetic acid solution. No AWE, SCJ seen , no gross lesion Specimens: none  Complications: none.     Plan   schedule CKC, procedure and risk for anesthesia, bleeding, infection, pain, pelvic organ damage were discussed and questions answered    ARNOLD,JAMES 03/04/2012, 11:03 AM

## 2012-03-04 NOTE — Patient Instructions (Signed)
Conization of the Cervix Conization is the cutting (excision) of a cone-shaped portion of the cervix. This procedure is usually done when there is abnormal bleeding from the cervix. It can also be done to evaluate an abnormal Pap smear or if an abnormality is seen on the cervix during an exam. Conization of the cervix is not done during a menstrual period or pregnancy. BEFORE THE PROCEDURE  Do not eat or drink anything for 6 to 8 hours before the procedure, especially if you are going to be given a drug to make you sleep (general anesthetic).   Do not take aspirin or blood thinners for at least a week before the procedure, or as directed.   Arrive at least an hour before the procedure to read and sign the necessary forms.   Arrange for someone to take you home after the procedure.   If you smoke, do not smoke for 2 weeks before the procedure.  LET YOUR CAREGIVER KNOW THE FOLLOWING:  Allergies to food or medications.   All the medications you are taking including over-the-counter and prescription medications, herbs, eyedrops, and creams.   You develop a cold or an infection.   If you are using illegal drugs or drinking too much alcohol.   Your smoking habits.   Previous problems with anesthetics including novocaine.   The possibility of being pregnant.   History of blood clots or other bleeding problems.   Other medical problems.  RISKS AND COMPLICATIONS   Bleeding.   Infection.   Damage to the cervix.   Injury to surrounding organs.   Problems with the anesthesia.  PROCEDURE Conization of the cervix can be done by:  Cold knife. This type cuts out the cervical canal and the transformation zone (where the normal cells end and the abnormal cells begin) with a scalpel.   LEEP (electrocautery). This type is done with a thin wire that can cut and burn (cauterize) the cervical tissue with an electrical current.   Laser treatment. This type cuts and burns (cauterizes) the  tissue of the cervix to prevent bleeding with a laser beam.  You will be given a drug to make you sleep (general anesthetic) for cold knife and laser treatments, and you will be given a numbing medication (local anesthetic) for the LEEP procedure. Conization is usually done using colposcopy. Colposcopy magnifies the cervix to see it more clearly. The tissue removed is examined under a microscope by a doctor (pathologist). The pathologist will provide a report to your caregiver. This will help your caregiver decide if further treatment is necessary. This report will also help your caregiver decide on the best treatment if your results are abnormal.  AFTER THE PROCEDURE  If you had a general anesthetic, you may be groggy for a couple of hours after the procedure.   If you had a local anesthetic, you will be advised to rest at the surgical center or caregiver's office until you are stable and feel ready to go home.   Have someone take you home.   You may have some cramping for a couple of days.   You may have a bloody discharge or light bleeding for 2 to 4 weeks.   You may have a black discharge coming from the vagina. This is from the paste used on the cervix to prevent bleeding. This is normal discharge.  HOME CARE INSTRUCTIONS   Do not drive for 24 hours.   Avoid strenuous activities and exercises for at least 7 -   10 days.   Only take over-the-counter or prescription medicines for pain, discomfort, or fever as directed by your caregiver.   Do not take aspirin. It can cause or aggravate bleeding.   You may resume your usual diet.   Drink 6 to 8 glasses of fluid a day.   Rest and sleep the first 24 to 48 hours.   Take showers instead of baths until your caregiver gives you the okay.   Do not use tampons, douche or have intercourse for 4 weeks, or as advised by your caregiver.   Do not lift anything over 10 pounds (4.5 kg) for at least 7 to 10 days, or as advised by your caregiver.     Take your temperature twice a day, for 4 to 5 days. Write them down.   Do not drink or drive when taking pain medication.   If you develop constipation:   Take a mild laxative with the advice of your caregiver.   Eat bran foods.   Drink a lot of fluids.   Try to have someone with you or available for you the first 24 to 48 hours, especially if you had a general anesthetic.   Make sure you and your family understand everything about your surgery and recovery.   Follow your caregiver's advice regarding follow-up appointments and Pap smears.  SEEK MEDICAL CARE IF:   You develop a rash.   You are dizzy or light-headed.   You feel sick to your stomach (nauseous).   You develop a bad smelling vaginal discharge.   You have an abnormal reaction or allergy to your medication.   You need stronger pain medication.  SEEK IMMEDIATE MEDICAL CARE IF:   You have blood clots or bleeding that is heavier than a normal menstrual period, or you develop bright red bleeding.   An oral temperature above 102 F (38.9 C) develops and persists for several hours.   You have increasing cramps.   You pass out.   You have painful or bloody urine.   You start throwing up (vomiting).   The pain is not relieved with your pain medication.  Not all test results are available during your visit. If your test results are not back during your the visit, make an appointment with your caregiver to find out the results. Do not assume everything is normal if you have not heard from your caregiver or the medical facility. It is important for you to follow up on all of your test results. Document Released: 04/01/2005 Document Revised: 06/11/2011 Document Reviewed: 01/21/2009 Exit[employer] Patient Information 2012 [employer]. 

## 2012-03-05 ENCOUNTER — Other Ambulatory Visit: Payer: Self-pay | Admitting: Obstetrics & Gynecology

## 2012-03-07 LAB — POCT PREGNANCY, URINE: Preg Test, Ur: NEGATIVE

## 2012-03-14 ENCOUNTER — Encounter (HOSPITAL_COMMUNITY): Payer: Self-pay | Admitting: Pharmacist

## 2012-03-18 ENCOUNTER — Encounter (HOSPITAL_COMMUNITY): Payer: Self-pay | Admitting: Family Medicine

## 2012-03-18 ENCOUNTER — Emergency Department (HOSPITAL_COMMUNITY)
Admission: EM | Admit: 2012-03-18 | Discharge: 2012-03-18 | Disposition: A | Discharge status: Home / Self Care | Payer: BC Managed Care – PPO | Attending: Emergency Medicine | Admitting: Emergency Medicine

## 2012-03-18 DIAGNOSIS — F17200 Nicotine dependence, unspecified, uncomplicated: Secondary | ICD-10-CM | POA: Insufficient documentation

## 2012-03-18 DIAGNOSIS — Z833 Family history of diabetes mellitus: Secondary | ICD-10-CM | POA: Insufficient documentation

## 2012-03-18 DIAGNOSIS — Z8249 Family history of ischemic heart disease and other diseases of the circulatory system: Secondary | ICD-10-CM | POA: Insufficient documentation

## 2012-03-18 DIAGNOSIS — J40 Bronchitis, not specified as acute or chronic: Secondary | ICD-10-CM | POA: Insufficient documentation

## 2012-03-18 MED ORDER — ALBUTEROL SULFATE HFA 108 (90 BASE) MCG/ACT IN AERS: 1.0000 | INHALATION_SPRAY | Freq: Four times a day (QID) | RESPIRATORY_TRACT | Status: DC | PRN

## 2012-03-18 MED ORDER — AZITHROMYCIN 250 MG PO TABS: 250.0000 mg | ORAL_TABLET | Freq: Every day | ORAL | Status: AC

## 2012-03-18 MED ORDER — GUAIFENESIN-CODEINE 100-10 MG/5ML PO SYRP: 5.0000 mL | ORAL_SOLUTION | Freq: Three times a day (TID) | ORAL | Status: AC | PRN

## 2012-03-18 NOTE — ED Notes (Signed)
Pt. C/o generalized body aches, sore throat, hoarseness, nasal congestion, and "burning" chest pain and chest congestion beginning approx. 2 days ago.  Pt. In no obvious respiratory distress at this time.

## 2012-03-18 NOTE — ED Provider Notes (Signed)
History     CSN: [id number]  Arrival date & time 03/18/12  4098   First MD Initiated Contact with Patient 03/18/12 [phone]      Chief Complaint  Patient presents with  . Generalized Body Aches  . Sore Throat    HPI April Ayala is a 36 y.o. female who presents to the ED with sore throat. The sore throat started 2 days ago. Associated symptoms include generalize body aches, nausea, cough and congestion. Cough is productive.  Hx of bronchitis. Patient has been a smoker since age 57. The history was provided by the patient.  History reviewed. No pertinent past medical history.  Past Surgical History  Procedure Date  . Ectopic pregnancy surgery   . Essure tubal ligation   . Leep     Family History  Problem Relation Age of Onset  . Diabetes Mother   . Hypertension Father   . Diabetes Sister     History  Substance Use Topics  . Smoking status: Current Every Day Smoker -- 0.5 packs/day    Types: Cigarettes  . Smokeless tobacco: Not on file  . Alcohol Use: No    OB History    Grav Para Term Preterm Abortions TAB SAB Ect Mult Living   4 4 4       4       Review of Systems  Constitutional: Positive for fatigue. Negative for fever, chills and diaphoresis.  HENT: Positive for ear pain, congestion, sore throat, sneezing and sinus pressure. Negative for facial swelling, neck pain, neck stiffness and dental problem.   Eyes: Positive for redness. Negative for photophobia, pain and discharge.  Respiratory: Positive for cough. Negative for chest tightness and wheezing.   Cardiovascular: Negative for chest pain and palpitations.  Gastrointestinal: Negative for nausea, vomiting, abdominal pain, diarrhea, constipation and abdominal distention.  Genitourinary: Negative for dysuria, urgency, frequency, flank pain, vaginal bleeding, vaginal discharge, difficulty urinating and pelvic pain.  Musculoskeletal: Positive for back pain. Negative for myalgias and gait problem.  Skin:  Negative for color change and rash.  Neurological: Positive for light-headedness and headaches. Negative for dizziness, syncope, speech difficulty, weakness and numbness.  Psychiatric/Behavioral: Negative for confusion and agitation.    Allergies  Review of patient's allergies indicates no known allergies.  Home Medications   Current Outpatient Rx  Name Route Sig Dispense Refill  . MULTI-VITAMIN/MINERALS PO TABS Oral Take 1 tablet by mouth daily.      BP 131/79  Pulse 95  Temp 97.8 F (36.6 C) (Oral)  Resp 16  Ht 5' 3.5" (1.613 m)  Wt 190 lb (86.183 kg)  BMI 33.13 kg/m2  SpO2 98%  LMP 03/16/2012  Physical Exam  Nursing note and vitals reviewed. Constitutional: She is oriented to person, place, and time. She appears well-developed and well-nourished.  HENT:  Head: Normocephalic and atraumatic.  Right Ear: Tympanic membrane, external ear and ear canal normal.  Left Ear: Tympanic membrane, external ear and ear canal normal.       Tenderness with palpation maxillary sinuses.   Eyes: Conjunctivae normal and EOM are normal. Pupils are equal, round, and reactive to light.  Neck: Trachea normal and normal range of motion. Neck supple.  Cardiovascular: Normal rate and regular rhythm.   No murmur heard. Pulmonary/Chest: Effort normal. No respiratory distress. She has wheezes (occasional). She has no rales.  Abdominal: Soft. There is no tenderness.  Musculoskeletal: Normal range of motion. She exhibits no edema.  Neurological: She is alert and oriented to  person, place, and time. She has normal strength. No cranial nerve deficit.  Skin: Skin is warm and dry.  Psychiatric: She has a normal mood and affect. Her behavior is normal. Judgment and thought content normal.   Assessment: 36 y.o. female with acute bronchitis  Plan:  Z-Pak   robutussin AC   Albuterol inhailer   Follow up with PCP, return as needed.   Medication List     As of 03/18/2012  9:48 AM    START taking these  medications         albuterol 108 (90 BASE) MCG/ACT inhaler   Commonly known as: PROVENTIL HFA;VENTOLIN HFA   Inhale 1-2 puffs into the lungs every 6 (six) hours as needed for wheezing.      azithromycin 250 MG tablet   Commonly known as: ZITHROMAX   Take 1 tablet (250 mg total) by mouth daily. Take first 2 tablets together, then 1 every day until finished.      guaiFENesin-codeine 100-10 MG/5ML syrup   Commonly known as: ROBITUSSIN AC   Take 5 mLs by mouth 3 (three) times daily as needed for cough.      ASK your doctor about these medications         multivitamin with minerals tablet          Where to get your medications    These are the prescriptions that you need to pick up.   You may get these medications from any pharmacy.         albuterol 108 (90 BASE) MCG/ACT inhaler   azithromycin 250 MG tablet   guaiFENesin-codeine 100-10 MG/5ML syrup           ED Course  Procedures         [facility], NP 03/18/12 [phone]

## 2012-03-19 NOTE — ED Provider Notes (Signed)
Medical screening examination/treatment/procedure(s) were performed by non-physician practitioner and as supervising physician I was immediately available for consultation/collaboration.  Donnetta Hutching, MD 03/19/12 [phone]

## 2012-03-23 ENCOUNTER — Encounter (HOSPITAL_COMMUNITY): Payer: Self-pay | Admitting: *Deleted

## 2012-03-24 ENCOUNTER — Ambulatory Visit (HOSPITAL_COMMUNITY): Payer: BC Managed Care – PPO | Admitting: Anesthesiology

## 2012-03-24 ENCOUNTER — Encounter (HOSPITAL_COMMUNITY)
Admission: RE | Disposition: A | Discharge status: Home / Self Care | Payer: Self-pay | Source: Ambulatory Visit | Attending: Obstetrics & Gynecology

## 2012-03-24 ENCOUNTER — Encounter (HOSPITAL_COMMUNITY): Payer: Self-pay | Admitting: Anesthesiology

## 2012-03-24 ENCOUNTER — Ambulatory Visit (HOSPITAL_COMMUNITY)
Admission: RE | Admit: 2012-03-24 | Discharge: 2012-03-24 | Disposition: A | Discharge status: Home / Self Care | Payer: BC Managed Care – PPO | Source: Ambulatory Visit | Attending: Obstetrics & Gynecology | Admitting: Obstetrics & Gynecology

## 2012-03-24 ENCOUNTER — Encounter (HOSPITAL_COMMUNITY): Payer: Self-pay | Admitting: *Deleted

## 2012-03-24 DIAGNOSIS — D069 Carcinoma in situ of cervix, unspecified: Secondary | ICD-10-CM | POA: Insufficient documentation

## 2012-03-24 HISTORY — PX: CERVICAL CONIZATION W/BX: SHX1330

## 2012-03-24 LAB — CBC
Platelets: 390 10*3/uL (ref 150–400)
RBC: 4.78 MIL/uL (ref 3.87–5.11)
WBC: 10.7 10*3/uL — ABNORMAL HIGH (ref 4.0–10.5)

## 2012-03-24 LAB — PREGNANCY, URINE: Preg Test, Ur: NEGATIVE

## 2012-03-24 SURGERY — CONE BIOPSY, CERVIX
Anesthesia: General | Site: Vagina | Wound class: Clean Contaminated

## 2012-03-24 MED ORDER — MIDAZOLAM HCL 5 MG/5ML IJ SOLN
INTRAMUSCULAR | Status: DC | PRN
  Administered 2012-03-24: 2 mg via INTRAVENOUS

## 2012-03-24 MED ORDER — ONDANSETRON HCL 4 MG/2ML IJ SOLN
INTRAMUSCULAR | Status: DC | PRN
  Administered 2012-03-24: 4 mg via INTRAVENOUS

## 2012-03-24 MED ORDER — MIDAZOLAM HCL 2 MG/2ML IJ SOLN
INTRAMUSCULAR | Status: AC
  Filled 2012-03-24: qty 2

## 2012-03-24 MED ORDER — LACTATED RINGERS IV SOLN
INTRAVENOUS | Status: DC
  Administered 2012-03-24 (×3): via INTRAVENOUS

## 2012-03-24 MED ORDER — LIDOCAINE-EPINEPHRINE 1 %-1:100000 IJ SOLN
INTRAMUSCULAR | Status: DC | PRN
  Administered 2012-03-24: 8 mL

## 2012-03-24 MED ORDER — KETOROLAC TROMETHAMINE 30 MG/ML IJ SOLN
INTRAMUSCULAR | Status: AC
  Filled 2012-03-24: qty 1

## 2012-03-24 MED ORDER — FENTANYL CITRATE 0.05 MG/ML IJ SOLN
INTRAMUSCULAR | Status: DC | PRN
  Administered 2012-03-24 (×2): 50 ug via INTRAVENOUS

## 2012-03-24 MED ORDER — HYDROCODONE-ACETAMINOPHEN 5-325 MG PO TABS
ORAL_TABLET | ORAL | Status: AC
  Administered 2012-03-24: 1
  Filled 2012-03-24: qty 1

## 2012-03-24 MED ORDER — LIDOCAINE HCL (CARDIAC) 20 MG/ML IV SOLN
INTRAVENOUS | Status: DC | PRN
  Administered 2012-03-24: 20 mg via INTRAVENOUS
  Administered 2012-03-24: 30 mg via INTRAVENOUS

## 2012-03-24 MED ORDER — PROPOFOL 10 MG/ML IV EMUL
INTRAVENOUS | Status: AC
  Filled 2012-03-24: qty 20

## 2012-03-24 MED ORDER — FENTANYL CITRATE 0.05 MG/ML IJ SOLN
INTRAMUSCULAR | Status: AC
  Filled 2012-03-24: qty 2

## 2012-03-24 MED ORDER — ONDANSETRON HCL 4 MG/2ML IJ SOLN
INTRAMUSCULAR | Status: AC
  Filled 2012-03-24: qty 2

## 2012-03-24 MED ORDER — KETOROLAC TROMETHAMINE 30 MG/ML IJ SOLN
INTRAMUSCULAR | Status: DC | PRN
  Administered 2012-03-24: 30 mg via INTRAVENOUS

## 2012-03-24 MED ORDER — HYDROCODONE-ACETAMINOPHEN 5-500 MG PO TABS: 1.0000 | ORAL_TABLET | Freq: Four times a day (QID) | ORAL | Status: DC | PRN

## 2012-03-24 MED ORDER — LIDOCAINE HCL (CARDIAC) 20 MG/ML IV SOLN
INTRAVENOUS | Status: AC
  Filled 2012-03-24: qty 5

## 2012-03-24 MED ORDER — HYDROCODONE-ACETAMINOPHEN 5-325 MG PO TABS: 1.0000 | ORAL_TABLET | Freq: Once | ORAL | Status: DC

## 2012-03-24 MED ORDER — LACTATED RINGERS IV SOLN: INTRAVENOUS | Status: DC

## 2012-03-24 MED ORDER — PROPOFOL 10 MG/ML IV EMUL
INTRAVENOUS | Status: DC | PRN
  Administered 2012-03-24: 160 mg via INTRAVENOUS

## 2012-03-24 SURGICAL SUPPLY — 28 items
APPLICATOR COTTON TIP 6IN STRL (MISCELLANEOUS) IMPLANT
BLADE SURG 11 STRL SS (BLADE) ×2 IMPLANT
CANISTER SUCTION 2500CC (MISCELLANEOUS) ×2 IMPLANT
CLOTH BEACON ORANGE TIMEOUT ST (SAFETY) ×2 IMPLANT
CONTAINER PREFILL 10% NBF 60ML (FORM) ×2 IMPLANT
COUNTER NEEDLE 1200 MAGNETIC (NEEDLE) ×2 IMPLANT
DRESSING TELFA 8X3 (GAUZE/BANDAGES/DRESSINGS) ×2 IMPLANT
ELECT REM PT RETURN 9FT ADLT (ELECTROSURGICAL) ×2
ELECTRODE REM PT RTRN 9FT ADLT (ELECTROSURGICAL) ×1 IMPLANT
GAUZE SPONGE 4X4 16PLY XRAY LF (GAUZE/BANDAGES/DRESSINGS) IMPLANT
GLOVE BIO SURGEON STRL SZ 6.5 (GLOVE) ×2 IMPLANT
GLOVE BIOGEL PI IND STRL 7.0 (GLOVE) ×2 IMPLANT
GLOVE BIOGEL PI INDICATOR 7.0 (GLOVE) ×2
GOWN PREVENTION PLUS LG XLONG (DISPOSABLE) ×4 IMPLANT
NEEDLE SPNL 22GX3.5 QUINCKE BK (NEEDLE) ×2 IMPLANT
NS IRRIG 1000ML POUR BTL (IV SOLUTION) ×2 IMPLANT
PACK VAGINAL MINOR WOMEN LF (CUSTOM PROCEDURE TRAY) ×2 IMPLANT
PAD OB MATERNITY 4.3X12.25 (PERSONAL CARE ITEMS) ×2 IMPLANT
PENCIL BUTTON HOLSTER BLD 10FT (ELECTRODE) IMPLANT
SCOPETTES 8  STERILE (MISCELLANEOUS) ×1
SCOPETTES 8 STERILE (MISCELLANEOUS) ×1 IMPLANT
SPONGE SURGIFOAM ABS GEL 12-7 (HEMOSTASIS) IMPLANT
SUT VICRYL 0 UR6 27IN ABS (SUTURE) ×4 IMPLANT
SYR CONTROL 10ML LL (SYRINGE) ×2 IMPLANT
TOWEL OR 17X24 6PK STRL BLUE (TOWEL DISPOSABLE) ×4 IMPLANT
TUBING NON-CON 1/4 X 20 CONN (TUBING) IMPLANT
WATER STERILE IRR 1000ML POUR (IV SOLUTION) ×2 IMPLANT
YANKAUER SUCT BULB TIP NO VENT (SUCTIONS) IMPLANT

## 2012-03-24 NOTE — H&P (Signed)
Patient ID: April Ayala, female   DOB: 1976-02-03, 36 y.o.   MRN: [id number]    Chief Complaint   Patient presents with   .  Procedure    CKC       HPI April Ayala is a 36 y.o. female.  Patient's last menstrual period was 03/17/12. Scheduled today for CKC, residual dysplasia after LEEP, HSIL and adenocarcinoma in situ  No obstetric history on file. Referred by [facility]. 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 HPI   Indications: Pap smear on November 2012 showed: high-grade squamous intraepithelial neoplasia  (HGSIL-encompassing moderate and severe dysplasia). Previous colposcopy: CIN 1, CIN 3 and in . Prior cervical treatment: LEEP.   No past medical history on file.    Past Surgical History   Procedure  Date   .  Ectopic pregnancy surgery     .  Essure tubal ligation     .  Leep          No family history on file.   Social History History   Substance Use Topics   .  Smoking status:  Current Everyday Smoker -- 0.5 packs/day       Types:  Cigarettes   .  Smokeless tobacco:  Not on file   .  Alcohol Use:  No        No Known Allergies No current facility-administered medications on file prior to encounter.   Current Outpatient Prescriptions on File Prior to Encounter  Medication Sig Dispense Refill  . albuterol (PROVENTIL HFA;VENTOLIN HFA) 108 (90 BASE) MCG/ACT inhaler Inhale 1-2 puffs into the lungs every 6 (six) hours as needed for wheezing.  1 Inhaler  0         Review of Systems Review of Systems  Genitourinary: Negative for vaginal bleeding, vaginal discharge, menstrual problem and pelvic pain.      Filed Vitals:   03/24/12 0854  BP: 128/77  Pulse: 83  Temp: 98.1 F (36.7 C)  Resp: 18     Physical Exam  NAD, alert Chest clear Heart RRR Constitutional: She appears well-developed. No distress.  Genitourinary: Vagina normal.   Cervix grossly normal post LEEP      Data Reviewed Biopsy result, notes  CBC    Component Value Date/Time   WBC 10.7* 03/24/2012 0851   RBC 4.78 03/24/2012 0851   HGB 13.9 03/24/2012 0851   HCT 42.2 03/24/2012 0851   PLT 390 03/24/2012 0851   MCV 88.3 03/24/2012 0851   MCH 29.1 03/24/2012 0851   MCHC 32.9 03/24/2012 0851   RDW 13.4 03/24/2012 0851   LYMPHSABS 4.7* 03/25/2011 0435   MONOABS 0.9 03/25/2011 0435   EOSABS 0.3 03/25/2011 0435   BASOSABS 0.1 03/25/2011 0435          Procedure Details  Colposcopy 03/04/12 The risks and benefits of the procedure and Written informed consent obtained.   Speculum placed in vagina and excellent visualization of cervix achieved, cervix swabbed x 3 with acetic acid solution. No AWE, SCJ seen , no gross lesion Specimens: none   Complications: none.  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 Questions were answered.     April Ayala 03/24/2012  9:54 AM

## 2012-03-24 NOTE — Op Note (Signed)
Procedure: Cold knife conization Preoperative diagnosis: High-grade intraepithelial lesion and history of adenocarcinoma in situ Postoperative diagnosis: Same Surgeon: Dr. Scheryl Darter Anesthesia: Gen. Specimen: Cervical cone biopsy Complications: None Estimated blood loss: Negligible Drains: None Counts: Correct Patient gave written consent for cold knife conization do to biopsy showing high-grade intraepithelial lesion. Previous diagnosis of cervical adenocarcinoma in situ. Patient identification was confirmed and she was brought to the OR and general anesthesia was induced. She was placed in dorsal lithotomy position. Perineum and vagina were sterilely prepped and draped. The bladder was drained with a red rubber catheter. Weighted speculum was inserted and the cervix was visualized. 1% lidocaine with 1 200,000 epinephrine was infiltrated for intracervical block. The cervix was grasped with single-tooth tenaculum. #11 blade was used to make an incision circumferentially. Curved Mayo scissors were used to excise the cone biopsy. 0 Vicryl sutures were placed at all 4 quadrants and good hemostasis was seen. All instruments were removed. The specimen was marked at 12:00 position with suture. The patient tolerated procedure well without consultations. She was brought in stable condition to PACU.  Dr. Scheryl Darter 06/23/2012 11:47 AM

## 2012-03-24 NOTE — Anesthesia Preprocedure Evaluation (Signed)
Anesthesia Evaluation    Airway Mallampati: II TM Distance: >3 FB Neck ROM: Full    Dental No notable dental hx. (+) Teeth Intact   Pulmonary asthma , Recent URI , Residual Cough, Current Smoker,  breath sounds clear to auscultation  Pulmonary exam normal       Cardiovascular negative cardio ROS  Rhythm:Regular Rate:Normal     Neuro/Psych negative neurological ROS  negative psych ROS   GI/Hepatic negative GI ROS, Neg liver ROS,   Endo/Other  negative endocrine ROS  Renal/GU negative Renal ROS   Severe Cervical Dysplasia     Musculoskeletal negative musculoskeletal ROS (+)   Abdominal Normal abdominal exam  (+)   Peds  Hematology negative hematology ROS (+)   Anesthesia Other Findings   Reproductive/Obstetrics negative OB ROS                           Anesthesia Physical Anesthesia Plan  ASA: II  Anesthesia Plan: General   Post-op Pain Management:    Induction: Intravenous  Airway Management Planned: LMA  Additional Equipment:   Intra-op Plan:   Post-operative Plan: Extubation in OR  Informed Consent: I have reviewed the patients History and Physical, chart, labs and discussed the procedure including the risks, benefits and alternatives for the proposed anesthesia with the patient or authorized representative who has indicated his/her understanding and acceptance.   Dental advisory given  Plan Discussed with: CRNA, Anesthesiologist and Surgeon  Anesthesia Plan Comments:         Anesthesia Quick Evaluation

## 2012-03-24 NOTE — Transfer of Care (Signed)
Immediate Anesthesia Transfer of Care Note  Patient: April Ayala  Procedure(s) Performed: Procedure(s) (LRB) with comments: CONIZATION CERVIX WITH BIOPSY (N/A)  Patient Location: PACU  Anesthesia Type: General  Level of Consciousness: awake, sedated and patient cooperative  Airway & Oxygen Therapy: Patient Spontanous Breathing and Patient connected to nasal cannula oxygen  Post-op Assessment: Report given to PACU RN, Post -op Vital signs reviewed and stable and Patient moving all extremities X 4  Post vital signs: Reviewed and stable  Complications: No apparent anesthesia complications

## 2012-03-25 ENCOUNTER — Encounter (HOSPITAL_COMMUNITY): Payer: Self-pay | Admitting: Obstetrics & Gynecology

## 2012-03-29 ENCOUNTER — Telehealth: Payer: Self-pay | Admitting: Obstetrics and Gynecology

## 2012-03-29 NOTE — Telephone Encounter (Signed)
Received a call from Kim with Dr. Nelly Rout Oncology RN about patient appointment. She is scheduled to see him 03/31/12 at 3:30. Patient will be called to come in and consult with provider before this appointment.

## 2012-03-30 ENCOUNTER — Ambulatory Visit (INDEPENDENT_AMBULATORY_CARE_PROVIDER_SITE_OTHER): Payer: BC Managed Care – PPO | Admitting: Obstetrics & Gynecology

## 2012-03-30 VITALS — BP 107/72 | HR 80 | Temp 98.3°F | Ht 63.0 in | Wt 195.0 lb

## 2012-03-30 DIAGNOSIS — C539 Malignant neoplasm of cervix uteri, unspecified: Secondary | ICD-10-CM | POA: Insufficient documentation

## 2012-03-30 DIAGNOSIS — Z09 Encounter for follow-up examination after completed treatment for conditions other than malignant neoplasm: Secondary | ICD-10-CM

## 2012-03-30 DIAGNOSIS — N644 Mastodynia: Secondary | ICD-10-CM

## 2012-03-30 NOTE — Progress Notes (Signed)
Also c/o pain and a knot in left breast

## 2012-03-30 NOTE — Progress Notes (Signed)
  Subjective:    Patient ID: April Ayala, female    DOB: [birth year], 36 y.o.   MRN: [id number]  [id number] Patient's last menstrual period was 03/16/2012. 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 Slight bleeding, no significant pelvic pain. Tender left outer breast.    Review of Systems Left breast pain    Objective:   Physical Exam  Constitutional: She appears well-developed. No distress.  Pulmonary/Chest: Effort normal.       Left breast no discrete mass, mild tenderness upper outer quadrant, no LA, right not tender, no masses  Abdominal: Soft. She exhibits no distension. There is no tenderness.  Skin: Skin is warm and dry.  Psychiatric: She has a normal mood and affect. Her behavior is normal.   Filed Vitals:   03/30/12 0910  BP: 107/72  Pulse: 80  Temp: 98.3 F (36.8 C)          Assessment & Plan:  Cervical cancer  F/U with Dr. Nelly Rout scheduled 9/26 1530. Diagnosis and further evaluation and surgical and possible radiation treatment were discussed today and her questions were answered.   Sigismund Cross 03/30/2012 9:55 AM

## 2012-03-30 NOTE — Patient Instructions (Signed)
Cervical Cancer The cervix is the opening and bottom part of the uterus between the birth canal (vagina) and the uterus (womb). Precancerous changes in the cells on the top layer of the cervix are an early sign that cervical cancer may develop. Cervical cancer is a fairly common cancer. It occurs most often in women ages 40 to 55. Cells of the cervix act very much like skin cells. These cells are exposed to toxins, viruses, and germs (bacteria) that may cause abnormal changes (cervical dysplasia).  Cervical dysplasia (abnormal growth) is judged by the thickness of the layer of abnormal cells. The earliest change that can be seen with a microscope is called mild dysplasia. If not treated, these precancerous changes may become moderate to severe, to cancer cells on the surface of the cervix (carcinoma in situ), to invasive cancer (cancer cells below the surface of the cervix). There are two kinds of cancers of the cervix:  Squamous (scale-like) cell carcinoma, starts in the flat or scale-like cells that line the cervix. This type of cancer is a sexually transmitted infection caused by the human papilloma virus (HPV).   Adenocarcinoma (starts on the surface of a gland) starts in glandular cells that line the cervix.  CAUSES The risk of getting cancer of the cervix is related to your lifestyle, sexual history, health, and your immune system. Causes and risks include:  Having a sexually transmitted virus infection. These infections are strongly linked with cancer of the cervix. They include:   Chlamydia.   Herpes.   Human papilloma virus (HPV).   Becoming sexually active before age 18.   Having more than 1 sexual partner, or having sex with someone who has more than one sex partner.   Not using condoms with sexual partners.   Having had cancer of the vagina or vulva. Or having sex with someone whose previous sexual partner had cancer of the cervix or cervical dysplasia.   Having a sexual  partner who has or had cancer of the penis.   Smoking.   Having a weakened immune system. For example, HIV or other immune deficiency disorders (organ transplant).   Being the daughter of a woman who took DES (diethylstilbestrol) during pregnancy.   A history of cancer of the cervix in your sister or mother.   African/American, Hispanic, and Asian/Pacific Islander women have a higher risk of getting cervical cancer.   A previous history of dysplasia (abnormal growth) of the cervix.  SYMPTOMS  Early stages of cervical cancer usually have no symptoms. Once the cancer invades the cervix and surrounding tissues, the woman may have vague symptoms, such as:  Abnormal vaginal bleeding or menstrual bleeding that is longer or heavier than usual.   Bleeding after intercourse, douching, or a pap test.   Vaginal bleeding following menopause.   Abnormal vaginal discharge.   Pelvic discomfort or pain.   Having an abnormal Pap test. (A Pap test is an exam where cells are scraped from the cervix and canal of the cervix, and are viewed under a microscope.)  Symptoms of more advanced cervical cancer may include:  Loss of appetite or weight loss.   Tiredness (fatigue).   Back and leg pain.   Inability to control urination or bowel movements.  DIAGNOSIS  Diagnosis of cervical cancer is found earliest and done best with regular pelvic exams and Pap tests. If abnormalities are found, the Pap test may be repeated in 3 months, or your caregiver may do additional tests, such as:    Colposcopy: A special microscope allows the caregiver to magnify and closely examine the cells of the cervix, vagina, and vulva.   Cervical biopsies: Small tissue samples are taken from the cervix to be examined under a microscope by a specialist. This can be done in your caregiver's office. This is done to determine if there is dysplasia or cancer cells. You cannot tell the stage of cervical cancer with a cervical biopsy.    A cone biopsy removes tissue to be tested for cancer. It can also be used to remove all the cancerous tissue. In a cone biopsy, a large, cone shaped tissue from the cervix is taken. This procedure can be done by:  Cold cone biopsy or laser cone. Both are done in an operating room under an anesthetic (you are asleep).   LEEP (loop electrosurgical excision procedure). Can be done in a doctor's office with a local anesthetic.   Other tests may be needed, including:   Cystoscopy (looking into the bladder with a lighted tube).   Proctoscopy or Sigmoidoscopy (looking into the rectum and lower intestine with a lighted tube).   Ultrasound.   CT Scan.   MRI.   Laparoscopy (using a lighted tube for examination).  There are different stages of cervical cancer:   Stage 0. CIS (carcinoma in situ). This first stage of cancer is the last and most serious stage of dysplasia (see above).   Stage 1. The tumor is in the uterus and cervix only.   Stage 2. The tumor has spread to the upper vagina. The cancer has spread beyond the uterus, but not to the pelvic walls or lower third of the vagina.   Stage 3. The tumor has invaded the side wall of the pelvis, and the lower third of the vagina. Blockage of the ureters (tubes that carry urine) from the tumor may cause urine to back up and swell the kidneys (hydronephrosis).   Stage 4. The tumor has spread to the rectum or bladder. In the later part of this stage, it has also spread to distant organs, like the lungs.  If abnormal cells are found early, it may be possible to avoid removing the uterus. If caught at an early stage, a woman can still have children and chances for a cure are good. Once cervical cancer reaches a late stage, more aggressive measures may be needed. Untreated, the cancer may spread to nearby areas or more distant sites in the body, through the blood and lymphatic system. Cervical cancer is not contagious and does not pose a risk to  others.  TREATMENT  Options for removal include the following:  Cone biopsy (see above).   Removal of the entire uterus and cervix (simple hysterectomy).   If the cancer has invaded deeper layers of the cervix and has spread to the uterus, more extensive treatment may be needed. This may include removal of the uterus, cervix, upper vagina, lymph nodes, and surrounding tissue (modified radical hysterectomy). This procedure depends on the extent of the cancer and a woman's age. The ovaries may be left in place or removed.   Sometimes medicines for treating cancer (radiation and/or chemotherapy) can be used. This is done when the cancer has spread beyond the cervix and uterus. These treatments may be used if a woman is not a good candidate for surgery. Age or other medical conditions may prevent a woman from being a good candidate for surgery. Radiation therapy may be used before or after surgery to shrink tumor cells   and kill any remaining tumor cells.   A combination of surgery, radiation, and chemotherapy may be needed, depending on the extent of the cancer.   Biological response modifiers (BRMs) are substances that help strengthen the immune system's fight against cancer or infection. Interferon is a BRM that is sometimes used in the treatment of cervical cancer. It may be used in combination with chemotherapy.   Treatment of squamous cell cancer or adenocarcinoma of the cervix are essentially the same.  Side effects of treatment:  A hysterectomy may cause inability to control urination or psychological sexual problems. There may be swelling in the legs if lymph nodes are removed. Occasionally, blood transfusions may be required. Allergic reactions can occur. Hemorrhage, infection, and rarely death, can occur.   Chemotherapy and radiation therapy may cause a wide variety of side effects. Including:   Hair loss.   Tiredness (fatigue).   Lessened ability to fight infections.   Feeling  sick to your stomach (nausea).   Vomiting.   Diarrhea.   Urinary problems.   Atrophic vaginitis (inflammation of the vagina) with painful intercourse.   Biological response modifiers such as interferon may cause flu-like symptoms. These include:   Body aches.   Nausea.   Vomiting.   Fatigue.  Treatment of cervical cancer in a pregnant woman:  Treatment of cervical cancer in pregnant women depends on the patient's culture, religious feelings, and ethical considerations.   A pregnant woman with cancer at Stage 0 or Stage 1, with microinvasion of 3mm or less (cancerous tissue has spread to a very small area), can deliver her baby vaginally. She can then be treated 6 weeks after the delivery, with surgery.   Other factors that can determine treatment include:   Size of the tumor.   Location of the tumor.   Stage of the pregnancy.   Desire of the patient to go on with the pregnancy.   Radiation with or without chemotherapy, following delivery and/or surgery, may be advised or necessary to prevent the cancerous tumor from coming back (recurrences).   Delaying treatment until the baby has a better chance to survive is better for the baby.  Follow-up on cervical cancer:  After treatment, follow-up is important to look for reoccurrence.   A pelvic exam and Pap test, if the cervix is intact, will be done every 3 months for at least 2 years. After the 2 year phase, a pelvic exam and Pap test will be done every 6 months. Because cancer tends to come back at the same spot or spread to the lungs and liver, chest x-rays and liver function tests are done yearly.   If a woman has had a hysterectomy, the top of the vagina is cuffed or closed. A colposcopy may be done at follow-up visits to examine the vaginal cuff.   Tell your caregiver about any new or worsening problems.  LENGTH OF ILLNESS The outcome for a woman with cervical cancer depends on many factors, including:  Overall  health.   Age when first diagnosed.   The type and growth of specific cancer cells.   How far the disease has spread.  After treatment, the length of time to live depends on the stage of the cancer. Your caregiver will discuss this with you and help you plan your treatment for the best possible outcome. PREVENTION   A Pap test is done to screen for cervical cancer.   The first Pap test should be done at age 21.     Between ages 21 and 29, Pap tests are repeated every 2 years.   Beginning at age 30, you are advised to have a Pap test every 3 years as long as your past 3 Pap tests have been normal.   Some women have medical problems that increase the chance of getting cervical cancer. Talk to your caregiver about these problems. It is especially important to talk to your caregiver if a new problem develops soon after your last Pap test. In these cases, your caregiver may recommend more frequent screening and Pap tests.   The above recommendations are the same for women who have or have not gotten the vaccine for HPV (Human Papillomavirus).   If you had a hysterectomy for a problem that was not a cancer or a condition that could lead to cancer, then you no longer need Pap tests. However, even if you no longer need a Pap test, a regular exam is a good idea to make sure no other problems are starting.    If you are between ages 65 and 70, and you have had normal Pap tests going back 10 years, you no longer need Pap tests. However, even if you no longer need a Pap test, a regular exam is a good idea to make sure no other problems are starting.    If you have had past treatment for cervical cancer or a condition that could lead to cancer, you need Pap tests and screening for cancer for at least 20 years after your treatment.   If Pap tests have been discontinued, risk factors (such as a new sexual partner)need to be re-assessed to determine if screening should be resumed.   Some women may  need screenings more often if they are at high risk for cervical cancer.   A woman can lower her risk for getting cervical cancer by:   Quitting smoking.   Waiting to have intercourse until age 18 or later.   Having only one sexual partner in a lifetime.   Using latex condoms.   Practicing safe sex with each sexual encounter.   Remaining celibate (not having sex). Celibate women do not get squamous cell cancer of the cervix, but they can get adenocarcinoma of the cervix.   A woman should ask her sexual partners about their sexual histories. By doing this, she can avoid partners that are high risk.   Identifying early warning signs of cervical cancer is also important. A woman should see her caregiver, and may need to be treated, if she has any of the following signs or symptoms:   Vaginal discharge that does not seem normal.   Vaginal bleeding between periods.   Bleeding with intercourse or after menopause.   Pain with intercourse (dyspareunia).   Vaccines are available to help prevent certain types of human papilloma virus infection and cervical cancer.  HOME CARE INSTRUCTIONS   Get a yearly gynecology examination and Pap test, or as advised by your caregiver.   Get the Human papilloma virus (HPV) vaccine.   Do not smoke.   Tell your caregiver if you have a family history of cancer of the cervix.   Do not have sexual intercourse before age 18.   Practice safe sex:   Use condoms.   Have one sex partner.   Make sure you are the only sex partner of your sex partner.  SEEK MEDICAL CARE IF:   You have abnormal vaginal bleeding.   You have abnormal vaginal discharge.   You   have vaginal bleeding after sexual intercourse, douching, or using tampons.   You develop vaginal bleeding after menopause.   You have pain with sexual intercourse.   You have unexplained weight loss.   You have unexplained tiredness.   You feel pressure with urination and/or with a bowel  movement.  SEEK IMMEDIATE MEDICAL CARE IF:   You have heavy vaginal bleeding, with or without clots.   You cannot urinate, or you have blood in your urine.   You have blood or pressure with a bowel movement.   You develop severe back, stomach, or pelvic pain.   You develop an unexplained temperature of 100 F (37.9 C), or higher.  Document Released: 06/22/2005 Document Revised: 06/11/2011 Document Reviewed: 05/01/2009 [employer] Patient Information 2012 [employer]. 

## 2012-03-31 ENCOUNTER — Encounter: Payer: Self-pay | Admitting: Gynecologic Oncology

## 2012-03-31 ENCOUNTER — Ambulatory Visit: Payer: BC Managed Care – PPO | Attending: Gynecologic Oncology | Admitting: Gynecologic Oncology

## 2012-03-31 VITALS — BP 102/64 | Temp 98.3°F | Resp 16 | Ht 62.72 in | Wt 196.3 lb

## 2012-03-31 DIAGNOSIS — C539 Malignant neoplasm of cervix uteri, unspecified: Secondary | ICD-10-CM

## 2012-03-31 DIAGNOSIS — J45909 Unspecified asthma, uncomplicated: Secondary | ICD-10-CM | POA: Insufficient documentation

## 2012-03-31 DIAGNOSIS — F17200 Nicotine dependence, unspecified, uncomplicated: Secondary | ICD-10-CM | POA: Insufficient documentation

## 2012-03-31 NOTE — Progress Notes (Signed)
Consult Note: Gyn-Onc  Consult was requested by Dr.Arnold for the evaluation of SAHARRA CALCOTE 36 y.o. female  CC:  Chief Complaint  Patient presents with  . Cervical Cancer    New consult    HPI: 36 y/o G7P4 LNMP 03/20/2012.    First abnormal pap 3 years ago treated with LEEP and patient lost to follow-up.  [facility] and abn pap identified.  She was then referred to Dr. Debroah Loop.  Pap 07/2011 [facility].  [facility]. 03/24/2012 Cervix, cone - INVASIVE MODERATELY DIFFERENTIATED SQUAMOUS CELL CARCINOMA, BROADLY INVOLVING THE CAUTERIZED INKED ENDOCERVICAL MARGIN.UTERINE CERVIX Maximum tumor size (cm): at least 1.1 cm from glass measurement Histologic type: invasive squamous cell carcinoma Grade: G2 Margins: Inked endocervical margin, broadly involved Depth of invasion: 8 mm in depth where cervix is 9 mm in thickness. Horizontal extent (mm): at lease 11 mm Lymph-Vascular invasion: Not identified  No postcoital bleeding, no vaginal spotting. no weight loss, no flank pain.   Current Meds:  Outpatient Encounter Prescriptions as of 03/31/2012  Medication Sig Dispense Refill  . albuterol (PROVENTIL HFA;VENTOLIN HFA) 108 (90 BASE) MCG/ACT inhaler Inhale 1-2 puffs into the lungs every 6 (six) hours as needed for wheezing.  1 Inhaler  0  . HYDROcodone-acetaminophen (VICODIN) 5-500 MG per tablet Take 1 tablet by mouth every 6 (six) hours as needed for pain.  12 tablet  0  . Multiple Vitamins-Minerals (MULTIVITAMIN WITH MINERALS) tablet Take 1 tablet by mouth daily.        Allergy: No Known Allergies  Social Hx:   History   Social History  . Marital Status: Married    Spouse Name: N/A    Number of Children: N/A  . Years of Education: N/A   Occupational History  . Not on file.   Social History Main Topics  . Smoking status: Current Every Day Smoker -- 0.5 packs/day for 19 years    Types: Cigarettes  . Smokeless tobacco: Never Used  . Alcohol Use: No  . Drug Use: No  .  Sexually Active: Yes    Birth Control/ Protection: Surgical   Other Topics Concern  . Not on file   Social History Narrative  . No narrative on file    Past Surgical Hx:  Past Surgical History  Procedure Date  . Ectopic pregnancy surgery   . Essure tubal ligation 2010  . Leep   . Diagnostic laparoscopy 2007    ectopic  . Cervical conization w/bx 03/24/2012    Procedure: CONIZATION CERVIX WITH BIOPSY;  Surgeon: Adam Phenix, MD;  Location: [facility];  Service: Gynecology;  Laterality: N/A;    Past Medical Hx:  Past Medical History  Diagnosis Date  . Asthma     recent bronchitis-rescue inhaler    Past Gynecological History:  G7P4. NSVD x 4  Menarche 14 regular menses.  Essure 2010.  Patient's last menstrual period was 03/16/2012.  Family Hx:  Family History  Problem Relation Age of Onset  . Diabetes Mother   . Hypertension Father   . Diabetes Sister     Review of Systems:  Constitutional  Feels well  Cardiovascular  No chest pain, shortness of breath, or edema  Pulmonary  Recent bronchitis and reports cough or wheeze.  Gastro Intestinal  No nausea, vomitting, or diarrhoea. No bright red blood per rectum, no abdominal pain, change in bowel movement, or constipation.  Genito Urinary  No frequency, urgency, dysuria, spotting since [facility] Musculo Skeletal  No myalgia, arthralgia,  joint swelling or pain  Neurologic  No weakness, numbness, change in gait,  Psychology  No depression, anxiety, insomnia.   Vitals:  Blood pressure 102/64, temperature 98.3 F (36.8 C), temperature source Oral, resp. rate 16, height 5' 2.72" (1.593 m), weight 196 lb 4.8 oz (89.041 kg), last menstrual period 03/16/2012. Body mass index is 35.09 kg/(m^2).   Physical Exam: WD in NAD Neck  Supple NROM, without any enlargements.  Lymph Node Survey No cervical supraclavicular or inguinal adenopathy Cardiovascular  Pulse normal rate, regularity and rhythm. S1 and S2 normal.  Lungs  Clear  to auscultation bilaterally.  Good air movement.  Skin  No rash/lesions/breakdown  Psychiatry  Alert and oriented to person, place, and time  Abdomen  Normoactive bowel sounds, abdomen soft, non-tender and obese.  Back No CVA tenderness Genito Urinary  Vulva/vagina: Normal external female genitalia.  No lesions. No discharge or bleeding.  Bladder/urethra:  No lesions or masses  Vagina: Well estrogenized.  Blood  Present in the vault.  No vaginal metastatic disease.  Cervix:  Clot in the cervical bleed with evidence of active bleeding.  Monsel solution applied.  No gross lesions.  Uterus: Mobile, no parametrial involvement or nodularity.  Adnexa: No palpable masses. Rectal  Good tone, no masses no cul de sac nodularity.  Extremities  No bilateral cyanosis, clubbing or edema.   Assessment/Plan:  Ms. SHANNYN Edom  is a 36 y.o. 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  The patient was counseled extensively on the treatment options for an early cervical cancer including radical hysterectomy, pelvic and para-aortic lymph node dissection +/-  Ovarian transposition and supra pubic catheter versus chemo-radiation with daily external beam radiation followed by intracavitary brachytherapy. We discussed equal cure rates and survival with each modality. We discussed the differences in toxicity, including fistula, bowel obstruction, radiation proctitis/enteritis/cystitis with XRT and long term sequela versus the risks of radical hysterectomy.  After counseling and consideration of her options, the patient desires surgical management. Given her age, we discussed the option to transpose her tubes and ovaries. She understands that there are potential complications associated such as  earlier menopause and painful ovarian cysts that require subsequent oophorectomy.  I extensively reviewed the risks of radical hysterectomy and lymph node dissection  including infection, bleeding, damage to surrounding structures including bowel, bladder, vessels, and ureters. We discussed postoperative risks including infection and risk of bowel and bladder dysfunction and lymphedema. We also reviewed possible need for further treatment such as radiation or chemotherapy based on final pathology. I counseled her on need for bladder drainage with a  catheter x 10-14 days and possible risk of prolonged catheterization if unable to void, as well as possible permanent bladder and rectal  dysfunction.    Orbie Pyo and her husband were given the opportunity to ask questions, which were answered to their satisfaction, and she is agreement with the above mentioned plan of care. Her procedure is scheduled with Dr. De Blanch for May 03 2012.  A clinic visit with Dr. Kemper Durie Person will be arranged beforehand.    Laurette Schimke, MD, PhD 03/31/2012, 3:52 PM

## 2012-04-01 NOTE — Patient Instructions (Signed)
Stage IB1 squamous carcinoma of the cervix.  Reccommend Radical hysterectomy, pelvic and para-aortic lymph node dissection +/-  Ovarian transposition and supra pubic catheter   The risks of radical hysterectomy and lymph node dissection include infection, bleeding, damage to surrounding structures including bowel, bladder, vessels, and ureters. Postoperative risks include infection and risk of bowel and bladder dysfunction and lymphedema. There is the possible need for further treatment such as radiation or chemotherapy based on final pathology. There is the possible need for bladder drainage with a  catheter x 10-14 days and possible risk of prolonged catheterization if unable to void, as well as possible permanent bladder and rectal  Dysfunction.   Ovarian transposition is associated with a risk of earlier menopause.   Her procedure is scheduled with Dr. De Blanch for May 03 2012.  A clinic visit with Dr. Kemper Durie Person will be arranged beforehand.

## 2012-04-15 ENCOUNTER — Ambulatory Visit (INDEPENDENT_AMBULATORY_CARE_PROVIDER_SITE_OTHER): Payer: BC Managed Care – PPO | Admitting: Obstetrics & Gynecology

## 2012-04-15 VITALS — BP 114/81 | HR 79 | Temp 97.9°F | Ht 63.0 in | Wt 198.0 lb

## 2012-04-15 DIAGNOSIS — C539 Malignant neoplasm of cervix uteri, unspecified: Secondary | ICD-10-CM

## 2012-04-15 NOTE — Patient Instructions (Signed)
Cervical Cancer The cervix is the opening and bottom part of the uterus between the birth canal (vagina) and the uterus (womb). Precancerous changes in the cells on the top layer of the cervix are an early sign that cervical cancer may develop. Cervical cancer is a fairly common cancer. It occurs most often in women ages 40 to 55. Cells of the cervix act very much like skin cells. These cells are exposed to toxins, viruses, and germs (bacteria) that may cause abnormal changes (cervical dysplasia).  Cervical dysplasia (abnormal growth) is judged by the thickness of the layer of abnormal cells. The earliest change that can be seen with a microscope is called mild dysplasia. If not treated, these precancerous changes may become moderate to severe, to cancer cells on the surface of the cervix (carcinoma in situ), to invasive cancer (cancer cells below the surface of the cervix). There are two kinds of cancers of the cervix:  Squamous (scale-like) cell carcinoma, starts in the flat or scale-like cells that line the cervix. This type of cancer is a sexually transmitted infection caused by the human papilloma virus (HPV).  Adenocarcinoma (starts on the surface of a gland) starts in glandular cells that line the cervix. CAUSES The risk of getting cancer of the cervix is related to your lifestyle, sexual history, health, and your immune system. Causes and risks include:  Having a sexually transmitted virus infection. These infections are strongly linked with cancer of the cervix. They include:  Chlamydia.  Herpes.  Human papilloma virus (HPV).  Becoming sexually active before age 18.  Having more than 1 sexual partner, or having sex with someone who has more than one sex partner.  Not using condoms with sexual partners.  Having had cancer of the vagina or vulva. Or having sex with someone whose previous sexual partner had cancer of the cervix or cervical dysplasia.  Having a sexual partner who has  or had cancer of the penis.  Smoking.  Having a weakened immune system. For example, HIV or other immune deficiency disorders (organ transplant).  Being the daughter of a woman who took DES (diethylstilbestrol) during pregnancy.  A history of cancer of the cervix in your sister or mother.  African/American, Hispanic, and Asian/Pacific Islander women have a higher risk of getting cervical cancer.  A previous history of dysplasia (abnormal growth) of the cervix. SYMPTOMS  Early stages of cervical cancer usually have no symptoms. Once the cancer invades the cervix and surrounding tissues, the woman may have vague symptoms, such as:  Abnormal vaginal bleeding or menstrual bleeding that is longer or heavier than usual.  Bleeding after intercourse, douching, or a pap test.  Vaginal bleeding following menopause.  Abnormal vaginal discharge.  Pelvic discomfort or pain.  Having an abnormal Pap test. (A Pap test is an exam where cells are scraped from the cervix and canal of the cervix, and are viewed under a microscope.) Symptoms of more advanced cervical cancer may include:  Loss of appetite or weight loss.  Tiredness (fatigue).  Back and leg pain.  Inability to control urination or bowel movements. DIAGNOSIS  Diagnosis of cervical cancer is found earliest and done best with regular pelvic exams and Pap tests. If abnormalities are found, the Pap test may be repeated in 3 months, or your caregiver may do additional tests, such as:  Colposcopy: A special microscope allows the caregiver to magnify and closely examine the cells of the cervix, vagina, and vulva.  Cervical biopsies: Small tissue samples are   taken from the cervix to be examined under a microscope by a specialist. This can be done in your caregiver's office. This is done to determine if there is dysplasia or cancer cells. You cannot tell the stage of cervical cancer with a cervical biopsy. A cone biopsy removes tissue to be  tested for cancer. It can also be used to remove all the cancerous tissue. In a cone biopsy, a large, cone shaped tissue from the cervix is taken. This procedure can be done by:  Cold cone biopsy or laser cone. Both are done in an operating room under an anesthetic (you are asleep).  LEEP (loop electrosurgical excision procedure). Can be done in a doctor's office with a local anesthetic.  Other tests may be needed, including:  Cystoscopy (looking into the bladder with a lighted tube).  Proctoscopy or Sigmoidoscopy (looking into the rectum and lower intestine with a lighted tube).  Ultrasound.  CT Scan.  MRI.  Laparoscopy (using a lighted tube for examination). There are different stages of cervical cancer:   Stage 0. CIS (carcinoma in situ). This first stage of cancer is the last and most serious stage of dysplasia (see above).  Stage 1. The tumor is in the uterus and cervix only.  Stage 2. The tumor has spread to the upper vagina. The cancer has spread beyond the uterus, but not to the pelvic walls or lower third of the vagina.  Stage 3. The tumor has invaded the side wall of the pelvis, and the lower third of the vagina. Blockage of the ureters (tubes that carry urine) from the tumor may cause urine to back up and swell the kidneys (hydronephrosis).  Stage 4. The tumor has spread to the rectum or bladder. In the later part of this stage, it has also spread to distant organs, like the lungs. If abnormal cells are found early, it may be possible to avoid removing the uterus. If caught at an early stage, a woman can still have children and chances for a cure are good. Once cervical cancer reaches a late stage, more aggressive measures may be needed. Untreated, the cancer may spread to nearby areas or more distant sites in the body, through the blood and lymphatic system. Cervical cancer is not contagious and does not pose a risk to others.  TREATMENT  Options for removal include the  following:  Cone biopsy (see above).  Removal of the entire uterus and cervix (simple hysterectomy).  If the cancer has invaded deeper layers of the cervix and has spread to the uterus, more extensive treatment may be needed. This may include removal of the uterus, cervix, upper vagina, lymph nodes, and surrounding tissue (modified radical hysterectomy). This procedure depends on the extent of the cancer and a woman's age. The ovaries may be left in place or removed.  Sometimes medicines for treating cancer (radiation and/or chemotherapy) can be used. This is done when the cancer has spread beyond the cervix and uterus. These treatments may be used if a woman is not a good candidate for surgery. Age or other medical conditions may prevent a woman from being a good candidate for surgery. Radiation therapy may be used before or after surgery to shrink tumor cells and kill any remaining tumor cells.  A combination of surgery, radiation, and chemotherapy may be needed, depending on the extent of the cancer.  Biological response modifiers (BRMs) are substances that help strengthen the immune system's fight against cancer or infection. Interferon is a BRM that   is sometimes used in the treatment of cervical cancer. It may be used in combination with chemotherapy.  Treatment of squamous cell cancer or adenocarcinoma of the cervix are essentially the same. Side effects of treatment:  A hysterectomy may cause inability to control urination or psychological sexual problems. There may be swelling in the legs if lymph nodes are removed. Occasionally, blood transfusions may be required. Allergic reactions can occur. Hemorrhage, infection, and rarely death, can occur.  Chemotherapy and radiation therapy may cause a wide variety of side effects. Including:  Hair loss.  Tiredness (fatigue).  Lessened ability to fight infections.  Feeling sick to your stomach (nausea).  Vomiting.  Diarrhea.  Urinary  problems.  Atrophic vaginitis (inflammation of the vagina) with painful intercourse.  Biological response modifiers such as interferon may cause flu-like symptoms. These include:  Body aches.  Nausea.  Vomiting.  Fatigue. Treatment of cervical cancer in a pregnant woman:  Treatment of cervical cancer in pregnant women depends on the patient's culture, religious feelings, and ethical considerations.  A pregnant woman with cancer at Stage 0 or Stage 1, with microinvasion of 3mm or less (cancerous tissue has spread to a very small area), can deliver her baby vaginally. She can then be treated 6 weeks after the delivery, with surgery.  Other factors that can determine treatment include:  Size of the tumor.  Location of the tumor.  Stage of the pregnancy.  Desire of the patient to go on with the pregnancy.  Radiation with or without chemotherapy, following delivery and/or surgery, may be advised or necessary to prevent the cancerous tumor from coming back (recurrences).  Delaying treatment until the baby has a better chance to survive is better for the baby. Follow-up on cervical cancer:  After treatment, follow-up is important to look for reoccurrence.  A pelvic exam and Pap test, if the cervix is intact, will be done every 3 months for at least 2 years. After the 2 year phase, a pelvic exam and Pap test will be done every 6 months. Because cancer tends to come back at the same spot or spread to the lungs and liver, chest x-rays and liver function tests are done yearly.  If a woman has had a hysterectomy, the top of the vagina is cuffed or closed. A colposcopy may be done at follow-up visits to examine the vaginal cuff.  Tell your caregiver about any new or worsening problems. LENGTH OF ILLNESS The outcome for a woman with cervical cancer depends on many factors, including:  Overall health.  Age when first diagnosed.  The type and growth of specific cancer cells.  How  far the disease has spread. After treatment, the length of time to live depends on the stage of the cancer. Your caregiver will discuss this with you and help you plan your treatment for the best possible outcome. PREVENTION   A Pap test is done to screen for cervical cancer.  The first Pap test should be done at age 21.  Between ages 21 and 29, Pap tests are repeated every 2 years.  Beginning at age 30, you are advised to have a Pap test every 3 years as long as your past 3 Pap tests have been normal.  Some women have medical problems that increase the chance of getting cervical cancer. Talk to your caregiver about these problems. It is especially important to talk to your caregiver if a new problem develops soon after your last Pap test. In these cases, your caregiver   may recommend more frequent screening and Pap tests.  The above recommendations are the same for women who have or have not gotten the vaccine for HPV (Human Papillomavirus).  If you had a hysterectomy for a problem that was not a cancer or a condition that could lead to cancer, then you no longer need Pap tests. However, even if you no longer need a Pap test, a regular exam is a good idea to make sure no other problems are starting.   If you are between ages 65 and 70, and you have had normal Pap tests going back 10 years, you no longer need Pap tests. However, even if you no longer need a Pap test, a regular exam is a good idea to make sure no other problems are starting.   If you have had past treatment for cervical cancer or a condition that could lead to cancer, you need Pap tests and screening for cancer for at least 20 years after your treatment.  If Pap tests have been discontinued, risk factors (such as a new sexual partner)need to be re-assessed to determine if screening should be resumed.  Some women may need screenings more often if they are at high risk for cervical cancer.  A woman can lower her risk for  getting cervical cancer by:  Quitting smoking.  Waiting to have intercourse until age 18 or later.  Having only one sexual partner in a lifetime.  Using latex condoms.  Practicing safe sex with each sexual encounter.  Remaining celibate (not having sex). Celibate women do not get squamous cell cancer of the cervix, but they can get adenocarcinoma of the cervix.  A woman should ask her sexual partners about their sexual histories. By doing this, she can avoid partners that are high risk.  Identifying early warning signs of cervical cancer is also important. A woman should see her caregiver, and may need to be treated, if she has any of the following signs or symptoms:  Vaginal discharge that does not seem normal.  Vaginal bleeding between periods.  Bleeding with intercourse or after menopause.  Pain with intercourse (dyspareunia).  Vaccines are available to help prevent certain types of human papilloma virus infection and cervical cancer. HOME CARE INSTRUCTIONS   Get a yearly gynecology examination and Pap test, or as advised by your caregiver.  Get the Human papilloma virus (HPV) vaccine.  Do not smoke.  Tell your caregiver if you have a family history of cancer of the cervix.  Do not have sexual intercourse before age 18.  Practice safe sex:  Use condoms.  Have one sex partner.  Make sure you are the only sex partner of your sex partner. SEEK MEDICAL CARE IF:   You have abnormal vaginal bleeding.  You have abnormal vaginal discharge.  You have vaginal bleeding after sexual intercourse, douching, or using tampons.  You develop vaginal bleeding after menopause.  You have pain with sexual intercourse.  You have unexplained weight loss.  You have unexplained tiredness.  You feel pressure with urination and/or with a bowel movement. SEEK IMMEDIATE MEDICAL CARE IF:   You have heavy vaginal bleeding, with or without clots.  You cannot urinate, or you have  blood in your urine.  You have blood or pressure with a bowel movement.  You develop severe back, stomach, or pelvic pain.  You develop an unexplained temperature of 100 F (37.9 C), or higher. Document Released: 06/22/2005 Document Revised: 09/14/2011 Document Reviewed: 05/01/2009 [employer] Patient Information 2013   [employer]  

## 2012-04-20 ENCOUNTER — Encounter: Payer: Self-pay | Admitting: Gynecology

## 2012-04-20 ENCOUNTER — Ambulatory Visit: Payer: BC Managed Care – PPO | Attending: Gynecology | Admitting: Gynecology

## 2012-04-20 VITALS — BP 120/68 | HR 90 | Temp 98.6°F | Resp 16 | Ht 62.72 in | Wt 198.0 lb

## 2012-04-20 DIAGNOSIS — F17200 Nicotine dependence, unspecified, uncomplicated: Secondary | ICD-10-CM | POA: Insufficient documentation

## 2012-04-20 DIAGNOSIS — C539 Malignant neoplasm of cervix uteri, unspecified: Secondary | ICD-10-CM | POA: Insufficient documentation

## 2012-04-20 NOTE — Patient Instructions (Signed)
you will be contacted for preoperative workup. Surgery is scheduled for 05/03/2012

## 2012-04-20 NOTE — Progress Notes (Signed)
Consult Note: Gyn-Onc   April Ayala 36 y.o. female  Chief Complaint  Patient presents with  . Cervical Cancer    Follow up    Interval History:  The patient returns today with her husband to plan surgery which is currently scheduled for October 29. Since her last visit she's done well. Her bleeding that was noted on the last examination has subsided. She denies any pelvic pain or pressure or any other GI or GU symptoms. She has not had any flair of asthma the last several months. Overall she's been healthy.  At her last visit the patient was counseled by Dr. Brewster regarding treatment options. She has selected to proceed with radical hysterectomy and pelvic lymphadenectomy. I again reviewed the pros and cons of surgery versus radiation therapy and all of her questions are answered. 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 Patient had a prior LEEP procedure approximately 3 years ago but then was lost to followup.  Review of Systems:10 point review of systems is negative as noted above.   Vitals: Blood pressure 120/68, pulse 90, temperature 98.6 F (37 C), temperature source Oral, resp. rate 16, height 5' 2.72" (1.593 m), weight 198 lb (89.812 kg), last menstrual period 04/11/2012.  Physical Exam: General : The patient is a healthy woman in no acute distress.  HEENT: normocephalic, extraoccular movements normal; neck is supple without thyromegally  Lynphnodes: Supraclavicular and inguinal nodes not enlarged  Abdomen: Soft, non-tender, no ascites, no organomegally, no masses, no hernias , she has a small pannus. Pelvic:  EGBUS: Normal female  Vagina: Normal, no lesions , grade 2 cystocele Urethra and Bladder: Normal, non-tender  Cervix: Is healed. There remains 1 Vicryl suture at 12:00. No gross lesions are noted. The cervix is not enlarged. Uterus: Anterior normal shape size and  consistency Bi-manual examination: Non-tender; no adenxal masses or nodularity  Rectal: normal sphincter tone, no masses, no blood , no parametrial involvement is noted. Lower extremities: No edema or varicosities. Normal range of motion    Assessment/Plan: Stage IB 1 squamous cell carcinoma the cervix.  We will proceed with primary treatment (radical hysterectomy and pelvic lymphadenectomy) on October 29. The risks of surgery including hemorrhage, infection, injury to adjacent viscera, thromboembolic complications, and anesthetic risks were outlined with the patient and her husband. I specifically discussed bladder dysfunction and the need for suprapubic catheter. She understands  that bladder  sensation or detrusor innervation may be altered by the surgery. She also understands that given her mild stress incontinence she may need surgery in the future to manage her cystocele.  All questions are answered and she will have preoperative workup in the near future.  No Known Allergies  Past Medical History  Diagnosis Date  . Asthma     recent bronchitis-rescue inhaler    Past Surgical History  Procedure Date  . Ectopic pregnancy surgery   . Essure tubal ligation 2010  . Leep   . Diagnostic laparoscopy 2007    ectopic  . Cervical conization w/bx 03/24/2012    Procedure: CONIZATION CERVIX WITH BIOPSY;  Surgeon: James G Arnold, MD;  Location: [facility];  Service: Gynecology;  Laterality: N/A;    Current Outpatient Prescriptions  Medication Sig Dispense Refill  . albuterol (PROVENTIL HFA;VENTOLIN HFA) 108 (90 BASE) MCG/ACT inhaler Inhale 1-2 puffs into the lungs every 6 (six) hours as needed for wheezing.  1 Inhaler  0  .   HYDROcodone-acetaminophen (VICODIN) 5-500 MG per tablet Take 1 tablet by mouth every 6 (six) hours as needed for pain.  12 tablet  0  . Multiple Vitamins-Minerals (MULTIVITAMIN WITH MINERALS) tablet Take 1 tablet by mouth daily.        History   Social History  .  Marital Status: Married    Spouse Name: N/A    Number of Children: N/A  . Years of Education: N/A   Occupational History  . Not on file.   Social History Main Topics  . Smoking status: Current Every Day Smoker -- 0.5 packs/day for 19 years    Types: Cigarettes  . Smokeless tobacco: Never Used   Comment: smoking cessation information given  . Alcohol Use: No  . Drug Use: No  . Sexually Active: Yes    Birth Control/ Protection: Surgical   Other Topics Concern  . Not on file   Social History Narrative  . No narrative on file    Family History  Problem Relation Age of Onset  . Diabetes Mother   . Hypertension Father   . Diabetes Sister       CLARKE-PEARSON,Dantonio Justen L, MD 04/20/2012, 9:15 AM         

## 2012-04-25 ENCOUNTER — Encounter (HOSPITAL_COMMUNITY): Payer: Self-pay | Admitting: Pharmacy Technician

## 2012-04-26 NOTE — Progress Notes (Signed)
Need surgery orders pre op is 10-15, thanks

## 2012-04-27 ENCOUNTER — Encounter: Payer: Self-pay | Admitting: Obstetrics & Gynecology

## 2012-04-27 NOTE — Progress Notes (Signed)
Patient ID: April Ayala, female   DOB: 12-21-1975, 36 y.o.   MRN: [id number] Patient's last menstrual period was 04/11/2012. Stage 1B1 cervical cancer followed by Dr. Nelly Rout and Dr. Loree Fee, scheduled for radical hysterectomy 08/04/11, for f/u visit tomorrow. Advised to keep scheduled appointments and she will return here as advised by her oncologists.  ARNOLD,JAMES

## 2012-04-29 ENCOUNTER — Encounter (HOSPITAL_COMMUNITY): Payer: Self-pay

## 2012-04-29 ENCOUNTER — Encounter (HOSPITAL_COMMUNITY)
Admission: RE | Admit: 2012-04-29 | Discharge: 2012-04-29 | Disposition: A | Discharge status: Home / Self Care | Payer: BC Managed Care – PPO | Source: Ambulatory Visit | Attending: Obstetrics & Gynecology | Admitting: Obstetrics & Gynecology

## 2012-04-29 ENCOUNTER — Ambulatory Visit (HOSPITAL_COMMUNITY)
Admission: RE | Admit: 2012-04-29 | Discharge: 2012-04-29 | Disposition: A | Discharge status: Home / Self Care | Payer: BC Managed Care – PPO | Source: Ambulatory Visit | Attending: Gynecology | Admitting: Gynecology

## 2012-04-29 DIAGNOSIS — C539 Malignant neoplasm of cervix uteri, unspecified: Secondary | ICD-10-CM | POA: Insufficient documentation

## 2012-04-29 DIAGNOSIS — Z01818 Encounter for other preprocedural examination: Secondary | ICD-10-CM | POA: Insufficient documentation

## 2012-04-29 HISTORY — DX: Personal history of urinary calculi: Z87.442

## 2012-04-29 HISTORY — DX: Malignant neoplasm of cervix uteri, unspecified: C53.9

## 2012-04-29 LAB — COMPREHENSIVE METABOLIC PANEL
ALT: 25 U/L (ref 0–35)
AST: 21 U/L (ref 0–37)
Albumin: 3.6 g/dL (ref 3.5–5.2)
Alkaline Phosphatase: 84 U/L (ref 39–117)
Glucose, Bld: 85 mg/dL (ref 70–99)
Potassium: 3.9 mEq/L (ref 3.5–5.1)
Sodium: 138 mEq/L (ref 135–145)
Total Protein: 6.5 g/dL (ref 6.0–8.3)

## 2012-04-29 LAB — CBC WITH DIFFERENTIAL/PLATELET
Basophils Absolute: 0.1 10*3/uL (ref 0.0–0.1)
Basophils Relative: 1 % (ref 0–1)
Eosinophils Absolute: 0.2 10*3/uL (ref 0.0–0.7)
Eosinophils Relative: 2 % (ref 0–5)
HCT: 40.9 % (ref 36.0–46.0)
Hemoglobin: 14.2 g/dL (ref 12.0–15.0)
Lymphocytes Relative: 39 % (ref 12–46)
Lymphs Abs: 4.1 10*3/uL — ABNORMAL HIGH (ref 0.7–4.0)
MCH: 29.6 pg (ref 26.0–34.0)
MCHC: 34.7 g/dL (ref 30.0–36.0)
MCV: 85.2 fL (ref 78.0–100.0)
Monocytes Absolute: 0.7 10*3/uL (ref 0.1–1.0)
Monocytes Relative: 7 % (ref 3–12)
Neutro Abs: 5.4 10*3/uL (ref 1.7–7.7)
Neutrophils Relative %: 51 % (ref 43–77)
Platelets: 370 10*3/uL (ref 150–400)
RBC: 4.8 MIL/uL (ref 3.87–5.11)
RDW: 12.9 % (ref 11.5–15.5)
WBC: 10.5 10*3/uL (ref 4.0–10.5)

## 2012-04-29 LAB — SURGICAL PCR SCREEN: MRSA, PCR: NEGATIVE

## 2012-04-29 NOTE — Patient Instructions (Addendum)
April Ayala  04/29/2012                           YOUR PROCEDURE IS SCHEDULED ON:  05/03/12 AT 7:15 AM               PLEASE REPORT TO SHORT STAY CENTER AT :  5:15 AM               CALL THIS NUMBER IF ANY PROBLEMS THE DAY OF SURGERY :               [phone]                      REMEMBER:   Do not eat food or drink liquids AFTER MIDNIGHT   Take these medicines the morning of surgery with A SIP OF WATER:  VICODIN   Do not wear jewelry, make-up   Do not wear lotions, powders, or perfumes.   Do not shave legs or underarms 12 hrs. before surgery (men may shave face)  Do not bring valuables to the hospital.  Contacts, dentures or bridgework may not be worn into surgery.  Leave suitcase in the car. After surgery it may be brought to your room.  For patients admitted to the hospital more than one night, checkout time is 11:00                          The day of discharge.   Patients discharged the day of surgery will not be allowed to drive home                             If going home same day of surgery, must have someone stay with you first                           24 hrs at home and arrange for some one to drive you home from hospital.    Special Instructions:   Please read over the following fact sheets that you were given:               1. MRSA  INFORMATION                      2. Bernie PREPARING FOR SURGERY SHEET               3. INCENTIVE SPIROMETER               4. FOLLOW BOWEL PREP INSTRUCTIIONS                                      X_____________________________________________________________________

## 2012-05-03 ENCOUNTER — Encounter (HOSPITAL_COMMUNITY): Payer: Self-pay | Admitting: Registered Nurse

## 2012-05-03 ENCOUNTER — Encounter (HOSPITAL_COMMUNITY): Payer: Self-pay | Admitting: *Deleted

## 2012-05-03 ENCOUNTER — Inpatient Hospital Stay (HOSPITAL_COMMUNITY)
Admission: RE | Admit: 2012-05-03 | Discharge: 2012-05-06 | DRG: 353 | Disposition: A | Discharge status: Home / Self Care | Payer: BC Managed Care – PPO | Source: Ambulatory Visit | Attending: Obstetrics & Gynecology | Admitting: Obstetrics & Gynecology

## 2012-05-03 ENCOUNTER — Encounter (HOSPITAL_COMMUNITY)
Admission: RE | Disposition: A | Discharge status: Home / Self Care | Payer: Self-pay | Source: Ambulatory Visit | Attending: Obstetrics & Gynecology

## 2012-05-03 ENCOUNTER — Ambulatory Visit (HOSPITAL_COMMUNITY): Payer: BC Managed Care – PPO | Admitting: Registered Nurse

## 2012-05-03 DIAGNOSIS — C539 Malignant neoplasm of cervix uteri, unspecified: Secondary | ICD-10-CM

## 2012-05-03 DIAGNOSIS — F17200 Nicotine dependence, unspecified, uncomplicated: Secondary | ICD-10-CM | POA: Diagnosis present

## 2012-05-03 HISTORY — PX: LYMPHADENECTOMY: SHX5960

## 2012-05-03 HISTORY — PX: INSERTION OF SUPRAPUBIC CATHETER: SHX5870

## 2012-05-03 HISTORY — PX: RADICAL HYSTERECTOMY: SHX2283

## 2012-05-03 LAB — COMPREHENSIVE METABOLIC PANEL
ALT: 123 U/L — ABNORMAL HIGH (ref 0–35)
Albumin: 2.6 g/dL — ABNORMAL LOW (ref 3.5–5.2)
Alkaline Phosphatase: 70 U/L (ref 39–117)
Glucose, Bld: 133 mg/dL — ABNORMAL HIGH (ref 70–99)
Potassium: 3.6 mEq/L (ref 3.5–5.1)
Sodium: 135 mEq/L (ref 135–145)
Total Protein: 4.8 g/dL — ABNORMAL LOW (ref 6.0–8.3)

## 2012-05-03 LAB — CBC
Hemoglobin: 11.8 g/dL — ABNORMAL LOW (ref 12.0–15.0)
MCHC: 34.4 g/dL (ref 30.0–36.0)
RDW: 13 % (ref 11.5–15.5)

## 2012-05-03 LAB — TYPE AND SCREEN

## 2012-05-03 SURGERY — INSERTION, SUPRAPUBIC CATHETER
Anesthesia: General | Wound class: Clean Contaminated

## 2012-05-03 MED ORDER — FENTANYL CITRATE 0.05 MG/ML IJ SOLN
INTRAMUSCULAR | Status: DC | PRN
  Administered 2012-05-03 (×6): 50 ug via INTRAVENOUS

## 2012-05-03 MED ORDER — PROMETHAZINE HCL 25 MG/ML IJ SOLN: 6.2500 mg | INTRAMUSCULAR | Status: DC | PRN

## 2012-05-03 MED ORDER — HYDROMORPHONE 0.3 MG/ML IV SOLN
INTRAVENOUS | Status: AC
  Administered 2012-05-03: 0.3 mg via INTRAVENOUS
  Filled 2012-05-03: qty 25

## 2012-05-03 MED ORDER — NICOTINE 14 MG/24HR TD PT24
14.0000 mg | MEDICATED_PATCH | Freq: Every day | TRANSDERMAL | Status: DC
  Administered 2012-05-03 – 2012-05-06 (×4): 14 mg via TRANSDERMAL
  Filled 2012-05-03 (×4): qty 1

## 2012-05-03 MED ORDER — ACETAMINOPHEN 10 MG/ML IV SOLN
INTRAVENOUS | Status: DC | PRN
  Administered 2012-05-03: 1000 mg via INTRAVENOUS

## 2012-05-03 MED ORDER — ACETAMINOPHEN 10 MG/ML IV SOLN
INTRAVENOUS | Status: AC
  Filled 2012-05-03: qty 100

## 2012-05-03 MED ORDER — KETOROLAC TROMETHAMINE 30 MG/ML IJ SOLN
INTRAMUSCULAR | Status: AC
  Filled 2012-05-03: qty 1

## 2012-05-03 MED ORDER — MIDAZOLAM HCL 5 MG/5ML IJ SOLN
INTRAMUSCULAR | Status: DC | PRN
  Administered 2012-05-03: 2 mg via INTRAVENOUS

## 2012-05-03 MED ORDER — PROPOFOL 10 MG/ML IV BOLUS
INTRAVENOUS | Status: DC | PRN
  Administered 2012-05-03: 200 mg via INTRAVENOUS

## 2012-05-03 MED ORDER — HYDROMORPHONE HCL PF 1 MG/ML IJ SOLN
INTRAMUSCULAR | Status: DC | PRN
  Administered 2012-05-03 (×2): 0.5 mg via INTRAVENOUS
  Administered 2012-05-03: .5 mg via INTRAVENOUS
  Administered 2012-05-03: 0.5 mg via INTRAVENOUS

## 2012-05-03 MED ORDER — KETOROLAC TROMETHAMINE 30 MG/ML IJ SOLN
15.0000 mg | Freq: Once | INTRAMUSCULAR | Status: AC | PRN
  Administered 2012-05-03: 30 mg via INTRAVENOUS

## 2012-05-03 MED ORDER — ROCURONIUM BROMIDE 100 MG/10ML IV SOLN
INTRAVENOUS | Status: DC | PRN
  Administered 2012-05-03: 10 mg via INTRAVENOUS
  Administered 2012-05-03: 5 mg via INTRAVENOUS
  Administered 2012-05-03: 40 mg via INTRAVENOUS

## 2012-05-03 MED ORDER — BUPIVACAINE LIPOSOME 1.3 % IJ SUSP
20.0000 mL | Freq: Once | INTRAMUSCULAR | Status: DC
  Filled 2012-05-03: qty 20

## 2012-05-03 MED ORDER — KETOROLAC TROMETHAMINE 30 MG/ML IJ SOLN
30.0000 mg | Freq: Three times a day (TID) | INTRAMUSCULAR | Status: DC
  Administered 2012-05-03 – 2012-05-04 (×2): 30 mg via INTRAVENOUS
  Filled 2012-05-03 (×5): qty 1

## 2012-05-03 MED ORDER — NEOSTIGMINE METHYLSULFATE 1 MG/ML IJ SOLN
INTRAMUSCULAR | Status: DC | PRN
  Administered 2012-05-03: 5 mg via INTRAVENOUS

## 2012-05-03 MED ORDER — GLYCOPYRROLATE 0.2 MG/ML IJ SOLN
INTRAMUSCULAR | Status: DC | PRN
  Administered 2012-05-03: .8 mg via INTRAVENOUS

## 2012-05-03 MED ORDER — DIPHENHYDRAMINE HCL 12.5 MG/5ML PO ELIX: 12.5000 mg | ORAL_SOLUTION | Freq: Four times a day (QID) | ORAL | Status: DC | PRN

## 2012-05-03 MED ORDER — ONDANSETRON HCL 4 MG/2ML IJ SOLN
4.0000 mg | Freq: Four times a day (QID) | INTRAMUSCULAR | Status: DC | PRN
  Administered 2012-05-04: 4 mg via INTRAVENOUS
  Filled 2012-05-03: qty 2

## 2012-05-03 MED ORDER — LIDOCAINE HCL (CARDIAC) 20 MG/ML IV SOLN
INTRAVENOUS | Status: DC | PRN
  Administered 2012-05-03: 80 mg via INTRAVENOUS

## 2012-05-03 MED ORDER — LACTATED RINGERS IV SOLN
INTRAVENOUS | Status: DC
  Administered 2012-05-03: 07:00:00 via INTRAVENOUS

## 2012-05-03 MED ORDER — CEFAZOLIN SODIUM-DEXTROSE 2-3 GM-% IV SOLR
2.0000 g | INTRAVENOUS | Status: AC
  Administered 2012-05-03: 2 g via INTRAVENOUS

## 2012-05-03 MED ORDER — NALOXONE HCL 0.4 MG/ML IJ SOLN: 0.4000 mg | INTRAMUSCULAR | Status: DC | PRN

## 2012-05-03 MED ORDER — SODIUM CHLORIDE 0.9 % IJ SOLN: 9.0000 mL | INTRAMUSCULAR | Status: DC | PRN

## 2012-05-03 MED ORDER — HYDROMORPHONE HCL PF 1 MG/ML IJ SOLN
0.2500 mg | INTRAMUSCULAR | Status: DC | PRN
  Administered 2012-05-03 (×4): 0.25 mg via INTRAVENOUS

## 2012-05-03 MED ORDER — ONDANSETRON HCL 4 MG/2ML IJ SOLN
INTRAMUSCULAR | Status: DC | PRN
  Administered 2012-05-03: 4 mg via INTRAVENOUS

## 2012-05-03 MED ORDER — DIPHENHYDRAMINE HCL 50 MG/ML IJ SOLN: 12.5000 mg | Freq: Four times a day (QID) | INTRAMUSCULAR | Status: DC | PRN

## 2012-05-03 MED ORDER — BUPIVACAINE LIPOSOME 1.3 % IJ SUSP
INTRAMUSCULAR | Status: DC | PRN
  Administered 2012-05-03: 40 mL

## 2012-05-03 MED ORDER — HETASTARCH-ELECTROLYTES 6 % IV SOLN
INTRAVENOUS | Status: DC | PRN
  Administered 2012-05-03: 09:00:00 via INTRAVENOUS

## 2012-05-03 MED ORDER — CEFAZOLIN SODIUM-DEXTROSE 2-3 GM-% IV SOLR
INTRAVENOUS | Status: AC
  Filled 2012-05-03: qty 50

## 2012-05-03 MED ORDER — DEXAMETHASONE SODIUM PHOSPHATE 10 MG/ML IJ SOLN
INTRAMUSCULAR | Status: DC | PRN
  Administered 2012-05-03: 10 mg via INTRAVENOUS

## 2012-05-03 MED ORDER — HYDROMORPHONE 0.3 MG/ML IV SOLN
INTRAVENOUS | Status: DC
  Administered 2012-05-03: 19:00:00 via INTRAVENOUS
  Administered 2012-05-03: 7.95 mg via INTRAVENOUS
  Administered 2012-05-03: 0.9 mg via INTRAVENOUS
  Administered 2012-05-04: 7.66 mg via INTRAVENOUS
  Administered 2012-05-04 (×2): via INTRAVENOUS
  Administered 2012-05-04: 3.6 mg via INTRAVENOUS
  Filled 2012-05-03 (×3): qty 25

## 2012-05-03 MED ORDER — 0.9 % SODIUM CHLORIDE (POUR BTL) OPTIME
TOPICAL | Status: DC | PRN
  Administered 2012-05-03: 2000 mL

## 2012-05-03 MED ORDER — KCL IN DEXTROSE-NACL 20-5-0.45 MEQ/L-%-% IV SOLN
INTRAVENOUS | Status: DC
  Administered 2012-05-03 – 2012-05-04 (×3): via INTRAVENOUS
  Filled 2012-05-03 (×4): qty 1000

## 2012-05-03 MED ORDER — HYDROMORPHONE HCL PF 1 MG/ML IJ SOLN
INTRAMUSCULAR | Status: AC
  Filled 2012-05-03: qty 1

## 2012-05-03 SURGICAL SUPPLY — 44 items
ATTRACTOMAT 16X20 MAGNETIC DRP (DRAPES) ×8 IMPLANT
BAG URINE DRAINAGE (UROLOGICAL SUPPLIES) ×4 IMPLANT
BLADE EXTENDED COATED 6.5IN (ELECTRODE) ×4 IMPLANT
CANISTER SUCTION 2500CC (MISCELLANEOUS) ×4 IMPLANT
CATH SILASTIC FOLEY 18FRX5CC (CATHETERS) ×4 IMPLANT
CHLORAPREP W/TINT 26ML (MISCELLANEOUS) ×4 IMPLANT
CLIP TI LARGE 6 (CLIP) ×4 IMPLANT
CLIP TI MEDIUM 6 (CLIP) ×4 IMPLANT
CLIP TI MEDIUM LARGE 6 (CLIP) ×16 IMPLANT
CLOTH BEACON ORANGE TIMEOUT ST (SAFETY) ×4 IMPLANT
COVER SURGICAL LIGHT HANDLE (MISCELLANEOUS) IMPLANT
CUTTER FLEX LINEAR 45M (STAPLE) ×4 IMPLANT
DRAPE UTILITY 15X26 (DRAPE) ×4 IMPLANT
DRAPE WARM FLUID 44X44 (DRAPE) ×4 IMPLANT
DRSG TELFA 4X14 ISLAND ADH (GAUZE/BANDAGES/DRESSINGS) ×4 IMPLANT
ELECT REM PT RETURN 9FT ADLT (ELECTROSURGICAL) ×4
ELECTRODE REM PT RTRN 9FT ADLT (ELECTROSURGICAL) ×3 IMPLANT
GAUZE SPONGE 2X2 8PLY STRL LF (GAUZE/BANDAGES/DRESSINGS) ×3 IMPLANT
GAUZE SPONGE 4X4 16PLY XRAY LF (GAUZE/BANDAGES/DRESSINGS) ×4 IMPLANT
GLOVE BIO SURGEON STRL SZ 6.5 (GLOVE) ×8 IMPLANT
GLOVE BIO SURGEON STRL SZ7.5 (GLOVE) ×8 IMPLANT
GOWN PREVENTION PLUS XLARGE (GOWN DISPOSABLE) IMPLANT
GOWN STRL NON-REIN LRG LVL3 (GOWN DISPOSABLE) ×8 IMPLANT
GOWN STRL REIN XL XLG (GOWN DISPOSABLE) ×8 IMPLANT
NS IRRIG 1000ML POUR BTL (IV SOLUTION) ×8 IMPLANT
PACK ABDOMINAL WL (CUSTOM PROCEDURE TRAY) ×4 IMPLANT
RELOAD 45 VASCULAR/THIN (ENDOMECHANICALS) ×20 IMPLANT
SHEET LAVH (DRAPES) ×4 IMPLANT
SPONGE GAUZE 2X2 STER 10/PKG (GAUZE/BANDAGES/DRESSINGS) ×1
SPONGE LAP 18X18 X RAY DECT (DISPOSABLE) ×4 IMPLANT
STAPLER VISISTAT 35W (STAPLE) ×4 IMPLANT
SUT PDS AB 1 CTXB1 36 (SUTURE) ×8 IMPLANT
SUT SILK 2 0 SH (SUTURE) ×12 IMPLANT
SUT VIC AB 0 CT1 27 (SUTURE) ×2
SUT VIC AB 0 CT1 27XBRD ANTBC (SUTURE) ×6 IMPLANT
SUT VIC AB 0 CT1 36 (SUTURE) ×12 IMPLANT
SUT VIC AB 2-0 CT2 27 (SUTURE) ×24 IMPLANT
SUT VIC AB 2-0 SH 27 (SUTURE) ×12
SUT VIC AB 2-0 SH 27X BRD (SUTURE) ×36 IMPLANT
SUT VICRYL 2 0 18  UND BR (SUTURE) ×1
SUT VICRYL 2 0 18 UND BR (SUTURE) ×3 IMPLANT
TOWEL OR 17X26 10 PK STRL BLUE (TOWEL DISPOSABLE) ×8 IMPLANT
TOWEL OR NON WOVEN STRL DISP B (DISPOSABLE) ×4 IMPLANT
TRAY FOLEY CATH 14FRSI W/METER (CATHETERS) ×4 IMPLANT

## 2012-05-03 NOTE — Anesthesia Postprocedure Evaluation (Signed)
  Anesthesia Post-op Note  Patient: April Ayala  Procedure(s) Performed: Procedure(s) (LRB): INSERTION OF SUPRAPUBIC CATHETER () RADICAL HYSTERECTOMY () LYMPHADENECTOMY (Bilateral)  Patient Location: PACU  Anesthesia Type: General  Level of Consciousness: awake and alert   Airway and Oxygen Therapy: Patient Spontanous Breathing  Post-op Pain: mild  Post-op Assessment: Post-op Vital signs reviewed, Patient's Cardiovascular Status Stable, Respiratory Function Stable, Patent Airway and No signs of Nausea or vomiting  Post-op Vital Signs: stable  Complications: No apparent anesthesia complications

## 2012-05-03 NOTE — Transfer of Care (Signed)
Immediate Anesthesia Transfer of Care Note  Patient: April Ayala  Procedure(s) Performed: Procedure(s) (LRB) with comments: INSERTION OF SUPRAPUBIC CATHETER () RADICAL HYSTERECTOMY () - transposition of the ovaries LYMPHADENECTOMY (Bilateral) - pelvic   Patient Location: PACU  Anesthesia Type:General  Level of Consciousness: awake, alert , oriented and patient cooperative  Airway & Oxygen Therapy: Patient Spontanous Breathing and Patient connected to face mask oxygen  Post-op Assessment: Report given to PACU RN, Post -op Vital signs reviewed and stable and Patient moving all extremities  Post vital signs: Reviewed and stable  Complications: No apparent anesthesia complications

## 2012-05-03 NOTE — H&P (View-Only) (Signed)
Consult Note: Gyn-Onc   April Ayala 36 y.o. female  Chief Complaint  Patient presents with  . Cervical Cancer    Follow up    Interval History:  The patient returns today with her husband to plan surgery which is currently scheduled for October 29. Since her last visit she's done well. Her bleeding that was noted on the last examination has subsided. She denies any pelvic pain or pressure or any other GI or GU symptoms. She has not had any flair of asthma the last several months. Overall she's been healthy.  At her last visit the patient was counseled by Dr. Nelly Rout regarding treatment options. She has selected to proceed with radical hysterectomy and pelvic lymphadenectomy. I again reviewed the pros and cons of surgery versus radiation therapy and all of her questions are answered. 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 Patient had a prior LEEP procedure approximately 3 years ago but then was lost to followup.  Review of Systems:10 point review of systems is negative as noted above.   Vitals: Blood pressure 120/68, pulse 90, temperature 98.6 F (37 C), temperature source Oral, resp. rate 16, height 5' 2.72" (1.593 m), weight 198 lb (89.812 kg), last menstrual period 04/11/2012.  Physical Exam: General : The patient is a healthy woman in no acute distress.  HEENT: normocephalic, extraoccular movements normal; neck is supple without thyromegally  Lynphnodes: Supraclavicular and inguinal nodes not enlarged  Abdomen: Soft, non-tender, no ascites, no organomegally, no masses, no hernias , she has a small pannus. Pelvic:  EGBUS: Normal female  Vagina: Normal, no lesions , grade 2 cystocele Urethra and Bladder: Normal, non-tender  Cervix: Is healed. There remains 1 Vicryl suture at 12:00. No gross lesions are noted. The cervix is not enlarged. Uterus: Anterior normal shape size and  consistency Bi-manual examination: Non-tender; no adenxal masses or nodularity  Rectal: normal sphincter tone, no masses, no blood , no parametrial involvement is noted. Lower extremities: No edema or varicosities. Normal range of motion    Assessment/Plan: Stage IB 1 squamous cell carcinoma the cervix.  We will proceed with primary treatment (radical hysterectomy and pelvic lymphadenectomy) on October 29. The risks of surgery including hemorrhage, infection, injury to adjacent viscera, thromboembolic complications, and anesthetic risks were outlined with the patient and her husband. I specifically discussed bladder dysfunction and the need for suprapubic catheter. She understands  that bladder  sensation or detrusor innervation may be altered by the surgery. She also understands that given her mild stress incontinence she may need surgery in the future to manage her cystocele.  All questions are answered and she will have preoperative workup in the near future.  No Known Allergies  Past Medical History  Diagnosis Date  . Asthma     recent bronchitis-rescue inhaler    Past Surgical History  Procedure Date  . Ectopic pregnancy surgery   . Essure tubal ligation 2010  . Leep   . Diagnostic laparoscopy 2007    ectopic  . Cervical conization w/bx 03/24/2012    Procedure: CONIZATION CERVIX WITH BIOPSY;  Surgeon: Adam Phenix, MD;  Location: [facility];  Service: Gynecology;  Laterality: N/A;    Current Outpatient Prescriptions  Medication Sig Dispense Refill  . albuterol (PROVENTIL HFA;VENTOLIN HFA) 108 (90 BASE) MCG/ACT inhaler Inhale 1-2 puffs into the lungs every 6 (six) hours as needed for wheezing.  1 Inhaler  0  .  HYDROcodone-acetaminophen (VICODIN) 5-500 MG per tablet Take 1 tablet by mouth every 6 (six) hours as needed for pain.  12 tablet  0  . Multiple Vitamins-Minerals (MULTIVITAMIN WITH MINERALS) tablet Take 1 tablet by mouth daily.        History   Social History  .  Marital Status: Married    Spouse Name: N/A    Number of Children: N/A  . Years of Education: N/A   Occupational History  . Not on file.   Social History Main Topics  . Smoking status: Current Every Day Smoker -- 0.5 packs/day for 19 years    Types: Cigarettes  . Smokeless tobacco: Never Used   Comment: smoking cessation information given  . Alcohol Use: No  . Drug Use: No  . Sexually Active: Yes    Birth Control/ Protection: Surgical   Other Topics Concern  . Not on file   Social History Narrative  . No narrative on file    Family History  Problem Relation Age of Onset  . Diabetes Mother   . Hypertension Father   . Diabetes Sister       Jeannette Corpus, MD 04/20/2012, 9:15 AM

## 2012-05-03 NOTE — Interval H&P Note (Signed)
History and Physical Interval Note:  05/03/2012 7:13 AM  April Ayala  has presented today for surgery, with the diagnosis of cervical cancer  The various methods of treatment have been discussed with the patient and family. After consideration of risks, benefits and other options for treatment, the patient has consented to  Procedure(s) (LRB) with comments: HYSTERECTOMY ABDOMINAL (N/A) - Radical Hysterectomy/Transposition of the Ovaries and Placement of Suprapubic Catheter as a surgical intervention .  The patient's history has been reviewed, patient examined, no change in status, stable for surgery.  I have reviewed the patient's chart and labs.  Questions were answered to the patient's satisfaction.     CLARKE-PEARSON,Virat Prather L

## 2012-05-03 NOTE — Progress Notes (Signed)
Spoke to [employer], NP informed urine bloody at this time, states should clear soon.

## 2012-05-03 NOTE — Progress Notes (Signed)
Spoke to [facility] discussed 50ml output from urethral catheter and 75ml for suprapubic over the last four hrs, but urethral foley now amber instead of bloody. April Ayala to monitor output and make sure atleast in 4 hrs

## 2012-05-03 NOTE — Op Note (Signed)
April Ayala  female MEDICAL RECORD ZO:[id number] DATE OF BIRTH: October 07, 1975 PHYSICIAN: De Blanch, M.D  DATE OF PROCEDURE: 05/03/2012    OPERATIVE REPORT  PREOPERATIVE DIAGNOSIS: Stage IBI squamous cell carcinoma of the cervix  POSTOPERATIVE DIAGNOSIS: Same  PROCEDURE: Radical abdominal hysterectomy, bilateral pelvic lymphadenectomy, placement of suprapubic catheter, transposition of the ovaries.  SURGEON: De Blanch, M.D ASSISTANT: Antionette Char M.D., Telford Nab R N. ANESTHESIA: Gen. oral tracheal tube ESTIMATED BLOOD LOSS: 1000 cc  SURGICAL FINDINGS: At the time exploratory laparotomy both the ovaries appear normal. The left fallopian tube was previously resected. Uterus was upper limits of normal size. There were no enlarged lymph nodes.  PROCEDURE: The patient was brought to the operating room and after satisfactory obtaining general anesthesia was placed a modified lithotomy position in Alligator stirrups. The anterior abdominal wall was prepped, the perineum vagina were prepped, the Foley catheter was placed. The patient was draped. The abdomen was entered through a Pfannensteil to incision. The Bookwalter retractor was positioned and the small bowel packed out of the pelvis. The uterus was grasped with large Kelly clamps. The round ligaments were divided and the retroperitoneal space was opened. The paravesical and pararectal spaces were opened identifying the iliac vessels and ureter. The ovarian ligament and tube were cross clamped ,divided free tied and suture ligated thus preserving the ovaries. The uterine artery was identified doubly clipped at its origin and divided. The cardinal ligament was divided with the linear stapler. The ureter was mobilized from its attachments to the medial peritoneum. Similar procedure was performed performed on the other side of the pelvis. The bladder flap was advanced with sharp and blunt dissection. The rectovaginal  septum was developed and the uterosacral ligaments were divided with linear staplers. The anterior vesicouterine ligament was then divided and the ureter retracted laterally. The posterior vesicouterine ligament was then crossclamped divided and suture ligated. The vaginal angles were encountered. These were cross clamped in the vagina transected. The uterus cervix were handed off the operative field. The vaginal angles were closed with transfixion suture of 0 Vicryl the central portion vagina closed with interrupted  sutures of 0 Vicryl. The ovaries were identified and marked with 2 large clips. They were then sutured into the paracolic gutters with 2-0 Vicryl.  Pelvic lymphadenectomy was then performed excising lymph nodes from the external iliac artery and vein internal iliac artery and obturator fossa. Throughout the dissection care was taken to avoid injury to vessels and the obturator nerve. The pelvis was irrigated and found to be hemostatic. The retropubic space was opened and a cystotomy created in the dome of the bladder. Through a stab wound in the midline in the suprapubic region a Foley catheter was inserted into the bladder. The bladder was closed with a running suture of 2-0 Vicryl.  The anterior abdominal wall was closed in layers. The first layer being a running suture of 2-0 Vicryl in the peritoneum. The fascia was closed with running suture of #1 PDS and the skin closed with skin staples after xpiril was injected in the subcutaneous tissue. A dressing was applied. The patient was awakened from anesthesia and taken to the recovery room in satisfactory condition. Sponge needle and his Tera Partridge, M.D

## 2012-05-03 NOTE — Anesthesia Preprocedure Evaluation (Addendum)
Anesthesia Evaluation  Patient identified by MRN, date of birth, ID band Patient awake    Reviewed: Allergy & Precautions, H&P , NPO status , Patient's Chart, lab work & pertinent test results  Airway Mallampati: II TM Distance: <3 FB Neck ROM: Full    Dental No notable dental hx.    Pulmonary asthma , Current Smoker,  breath sounds clear to auscultation  Pulmonary exam normal       Cardiovascular negative cardio ROS  Rhythm:Regular Rate:Normal     Neuro/Psych negative neurological ROS  negative psych ROS   GI/Hepatic negative GI ROS, Neg liver ROS,   Endo/Other  negative endocrine ROS  Renal/GU negative Renal ROS  negative genitourinary   Musculoskeletal negative musculoskeletal ROS (+)   Abdominal   Peds negative pediatric ROS (+)  Hematology negative hematology ROS (+)   Anesthesia Other Findings   Reproductive/Obstetrics negative OB ROS                           Anesthesia Physical Anesthesia Plan  ASA: II  Anesthesia Plan: General   Post-op Pain Management:    Induction: Intravenous  Airway Management Planned: Oral ETT  Additional Equipment:   Intra-op Plan:   Post-operative Plan: Extubation in OR  Informed Consent: I have reviewed the patients History and Physical, chart, labs and discussed the procedure including the risks, benefits and alternatives for the proposed anesthesia with the patient or authorized representative who has indicated his/her understanding and acceptance.   Dental advisory given  Plan Discussed with: CRNA and Surgeon  Anesthesia Plan Comments:         Anesthesia Quick Evaluation

## 2012-05-03 NOTE — Progress Notes (Signed)
Paged Warner Mccreedy, NP to inform of urine output, awaiting return call

## 2012-05-03 NOTE — Preoperative (Signed)
Beta Blockers   Reason not to administer Beta Blockers:Not Applicable 

## 2012-05-04 ENCOUNTER — Encounter (HOSPITAL_COMMUNITY): Payer: Self-pay | Admitting: Gynecology

## 2012-05-04 LAB — CBC
HCT: 30.6 % — ABNORMAL LOW (ref 36.0–46.0)
MCH: 30 pg (ref 26.0–34.0)
MCHC: 34.3 g/dL (ref 30.0–36.0)
MCV: 87.4 fL (ref 78.0–100.0)
RDW: 13.2 % (ref 11.5–15.5)

## 2012-05-04 LAB — BASIC METABOLIC PANEL
BUN: 10 mg/dL (ref 6–23)
Creatinine, Ser: 0.8 mg/dL (ref 0.50–1.10)
GFR calc Af Amer: >90 mL/min (ref >90)
GFR calc non Af Amer: >90 mL/min (ref >90)

## 2012-05-04 MED ORDER — FAMOTIDINE 20 MG PO TABS
20.0000 mg | ORAL_TABLET | Freq: Two times a day (BID) | ORAL | Status: DC
  Administered 2012-05-04 – 2012-05-06 (×5): 20 mg via ORAL
  Filled 2012-05-04 (×6): qty 1

## 2012-05-04 MED ORDER — OXYCODONE-ACETAMINOPHEN 5-325 MG PO TABS
1.0000 | ORAL_TABLET | ORAL | Status: DC | PRN
  Administered 2012-05-04 – 2012-05-06 (×11): 2 via ORAL
  Filled 2012-05-04 (×11): qty 2

## 2012-05-04 MED ORDER — IBUPROFEN 600 MG PO TABS
600.0000 mg | ORAL_TABLET | Freq: Four times a day (QID) | ORAL | Status: DC | PRN
  Administered 2012-05-04 – 2012-05-06 (×6): 600 mg via ORAL
  Filled 2012-05-04 (×6): qty 1

## 2012-05-04 MED ORDER — DIPHENHYDRAMINE HCL 25 MG PO CAPS
25.0000 mg | ORAL_CAPSULE | Freq: Four times a day (QID) | ORAL | Status: DC | PRN
  Administered 2012-05-04 – 2012-05-05 (×3): 25 mg via ORAL
  Filled 2012-05-04 (×3): qty 1

## 2012-05-04 NOTE — Progress Notes (Signed)
Dr.Jackson-Moore aware via phone of pt status. Urethral foley dc'd this am per order. Pt tolerating po clear fluids and increased activity observed after encouragement from staff, yet pt very groggy at times dozing off. O2 SATs dropping at times as low as 79-80% when pt dozes. Awake, pt sats WNL mid to high 90%. Highest rated pain level this am 5/10. See new orders to dc IVF and PCA and begin po analgesics. Additional order received for Pepcid due to pt c/o "heartburn".

## 2012-05-04 NOTE — Care Management Note (Signed)
    Page 1 of 1   05/06/2012     12:49:25 PM   CARE MANAGEMENT NOTE 05/06/2012  Patient:  April Ayala, April Ayala   Account Number:  [id number]  Date Initiated:  05/04/2012  Documentation initiated by:  Lorenda Ishihara  Subjective/Objective Assessment:   36 yo female admitted s/p TAH, lymphadenectomy, suprapubic cath. PTA lived at home with spouse.     Action/Plan:   home when stable   Anticipated DC Date:  05/08/2012   Anticipated DC Plan:  HOME/SELF CARE      DC Planning Services  CM consult      Choice offered to / List presented to:             Status of service:  Completed, signed off Medicare Important Message given?   (If response is "NO", the following Medicare IM given date fields will be blank) Date Medicare IM given:   Date Additional Medicare IM given:    Discharge Disposition:  HOME/SELF CARE  Per UR Regulation:  Reviewed for med. necessity/level of care/duration of stay  If discussed at Long Length of Stay Meetings, dates discussed:    Comments:

## 2012-05-04 NOTE — Progress Notes (Signed)
1 Day Post-Op Procedure(s) (LRB): INSERTION OF SUPRAPUBIC CATHETER () RADICAL HYSTERECTOMY () LYMPHADENECTOMY (Bilateral)  Subjective: Patient reports being "thirsty."  Reporting adequate pain relief with pain at a 4.  Denies nausea, vomiting, passing flatus, or having a bowel movement.    Objective: Vital signs in last 24 hours: Temp:  [97.3 F (36.3 C)-98.8 F (37.1 C)] 98.3 F (36.8 C) (10/30 0528) Pulse Rate:  [65-99] 65  (10/30 0528) Resp:  [7-20] 12  (10/30 0826) BP: (84-155)/(42-95) 99/50 mmHg (10/30 0528) SpO2:  [92 %-100 %] 97 % (10/30 0826) Weight:  [195 lb (88.451 kg)] 195 lb (88.451 kg) (10/29 1439)   Intake/Output from previous day: 10/29 0701 - 10/30 0700 In: 5545.8 [I.V.:5043.8; IV Piggyback:502] Out: 2210 [Urine:1210; Blood:1000]  Physical Examination: General: alert, cooperative and no distress Resp: clear to auscultation bilaterally Cardio: regular rate and rhythm, S1, S2 normal, no murmur, click, rub or gallop GI: abnormal findings:  hypoactive bowel sounds, obese and abd soft and incision: low transverse incision with staples open to air, clean, dry, and intact. Extremities: extremities normal, atraumatic, no cyanosis or edema Suprapubic catheter to straight drain  Labs: WBC/Hgb/Hct/Plts:  17.8/10.5/30.6/294 (10/30 1610) BUN/Cr/glu/ALT/AST/amyl/lip:  10/0.80/--/--/--/--/-- (10/30 9604)  Assessment: 36 y.o. s/p Procedure(s): INSERTION OF SUPRAPUBIC CATHETER RADICAL HYSTERECTOMY LYMPHADENECTOMY: stable Pain:  Pain is well-controlled on PCA.  Heme:  Hemoglobin 10.5 and hematocrit 30.6 this am.  In PACU on 05/03/12, Hgb 11.8 and Hct 34.3.  Plan to monitor.  GI:  Tolerating po: No: NPO     Prophylaxis: intermittent pneumatic compression boots.  Plan: Clear liquids Encourage ambulation Encourage IS use, deep breathing, and coughing Discontinue foley catheter and monitor suprapubic output Continue post-operative care CBC in the am   LOS: 1 day     Jillienne Egner DEAL 05/04/2012, 10:34 AM

## 2012-05-04 NOTE — Plan of Care (Signed)
Problem: Phase I Progression Outcomes Goal: OOB as tolerated unless otherwise ordered Outcome: Completed/Met Date Met:  05/04/12 Ambulated multiple times in hall this today.

## 2012-05-05 LAB — HEMOGLOBIN AND HEMATOCRIT, BLOOD
HCT: 27.7 % — ABNORMAL LOW (ref 36.0–46.0)
Hemoglobin: 9.6 g/dL — ABNORMAL LOW (ref 12.0–15.0)

## 2012-05-05 LAB — CBC
MCHC: 34.2 g/dL (ref 30.0–36.0)
RDW: 13.4 % (ref 11.5–15.5)

## 2012-05-05 MED ORDER — BISACODYL 10 MG RE SUPP
10.0000 mg | Freq: Every day | RECTAL | Status: DC | PRN
  Administered 2012-05-05 – 2012-05-06 (×2): 10 mg via RECTAL
  Filled 2012-05-05 (×2): qty 1

## 2012-05-05 MED ORDER — SIMETHICONE 80 MG PO CHEW
80.0000 mg | CHEWABLE_TABLET | Freq: Four times a day (QID) | ORAL | Status: DC | PRN
  Administered 2012-05-05 – 2012-05-06 (×5): 80 mg via ORAL
  Filled 2012-05-05 (×4): qty 1

## 2012-05-05 MED ORDER — PNEUMOCOCCAL VAC POLYVALENT 25 MCG/0.5ML IJ INJ
0.5000 mL | INJECTION | INTRAMUSCULAR | Status: AC
  Administered 2012-05-06: 0.5 mL via INTRAMUSCULAR
  Filled 2012-05-05: qty 0.5

## 2012-05-05 MED ORDER — BISACODYL 10 MG RE SUPP
10.0000 mg | Freq: Once | RECTAL | Status: AC
  Administered 2012-05-05: 10 mg via RECTAL
  Filled 2012-05-05: qty 1

## 2012-05-05 NOTE — Plan of Care (Signed)
Problem: Phase I Progression Outcomes Goal: Voiding-avoid urinary catheter unless indicated Outcome: Not Progressing Suprapubic catheter     

## 2012-05-05 NOTE — Progress Notes (Signed)
Pt stated that she was having pain while passing gas.  Said MD had talked about a medication, but no meds are put into the [facility] at this time.  Told pt to discuss Simethicone with MD.  Pain managed at present time.  Sherron Monday

## 2012-05-05 NOTE — Progress Notes (Signed)
2 Days Post-Op Procedure(s) (LRB): INSERTION OF SUPRAPUBIC CATHETER () RADICAL HYSTERECTOMY () LYMPHADENECTOMY (Bilateral)  Subjective: Patient reports "having gas pains."  Reports tolerating liquids.  Reporting adequate pain relief.  Denies nausea, vomiting, passing flatus, or having a bowel movement.   Objective: Vital signs in last 24 hours: Temp:  [98.2 F (36.8 C)-98.9 F (37.2 C)] 98.7 F (37.1 C) (10/31 0515) Pulse Rate:  [80-94] 94  (10/31 0515) Resp:  [16-18] 18  (10/31 0515) BP: (97-123)/(51-67) 123/67 mmHg (10/31 0515) SpO2:  [95 %-98 %] 98 % (10/31 0515) Last BM Date: 05/02/12  Intake/Output from previous day: 10/30 0701 - 10/31 0700 In: 1480 [P.O.:480] Out: 4550 [Urine:4550]  Physical Examination: General: alert, cooperative and no distress Resp: clear to auscultation bilaterally Cardio: regular rate and rhythm, S1, S2 normal, no murmur, click, rub or gallop GI: soft, non-tender; bowel sounds normal; no masses,  no organomegaly, incision: low transverse incision with staples open to air.  Suprapubic catheter dressing dry and intact. and abd obese Extremities: extremities normal, atraumatic, no cyanosis or edema  Labs: WBC/Hgb/Hct/Plts:  8.3/8.9/26.0/241 (10/31 0426)    Assessment: 36 y.o. s/p Procedure(s): INSERTION OF SUPRAPUBIC CATHETER RADICAL HYSTERECTOMY LYMPHADENECTOMY: stable Pain:  Pain is well-controlled on oral medications.  Heme:  Hemoglobin 8.9 and Hct 26.0 this am.  Patient asymptomatic.  Plan to monitor.  GI:  Tolerating po: Yes.    Prophylaxis: intermittent pneumatic compression boots.  Plan: Advance diet Encourage ambulation Advance to PO medication Simethicone PO as needed for gas pain Dulcolax suppository this am   LOS: 2 days    CROSS, MELISSA DEAL 05/05/2012, 10:53 AM

## 2012-05-06 ENCOUNTER — Ambulatory Visit: Payer: BC Managed Care – PPO | Admitting: Gynecology

## 2012-05-06 MED ORDER — OXYCODONE-ACETAMINOPHEN 5-325 MG PO TABS: 1.0000 | ORAL_TABLET | ORAL | Status: DC | PRN

## 2012-05-06 NOTE — Discharge Summary (Signed)
Physician Discharge Summary  Patient ID: April Ayala MRN: [id number] DOB/AGE: 09/06/1975 36 y.o.  Admit date: 05/03/2012 Discharge date: 05/06/2012  Admission Diagnoses: Cervical cancer  Discharge Diagnoses:  Principal Problem:  *Cervical cancer  Discharged Condition:  The patient is in good condition and stable for discharge.  Hospital Course: On 05/03/2012, the patient underwent the following: Procedure(s): INSERTION OF SUPRAPUBIC CATHETER, RADICAL HYSTERECTOMY, LYMPHADENECTOMY.  The postoperative course was uneventful.  She was discharged to home on postoperative day 3 tolerating a regular diet.  She was instructed on suprapubic catheter care and supplies given prior to discharge.  Consults: None  Significant Diagnostic Studies: None  Treatments: surgery: See above  Discharge Exam: Blood pressure 120/81, pulse 97, temperature 100 F (37.8 C), temperature source Oral, resp. rate 18, height 5\' 3"  (1.6 m), weight 195 lb (88.451 kg), SpO2 97.00%. General appearance: alert, cooperative and no distress Resp: clear to auscultation bilaterally Cardio: regular rate and rhythm, S1, S2 normal, no murmur, click, rub or gallop GI: soft, non-tender; bowel sounds normal; no masses,  no organomegaly and abdomen obese Extremities: extremities normal, atraumatic, no cyanosis or edema Incision/Wound: Low transverse incision with staples and suprapubic site clean, dry, and intact.   Disposition: 01-Home or Self Care  Discharge Orders    Future Orders Please Complete By Expires   Diet - low sodium heart healthy      Increase activity slowly      Driving Restrictions      Comments:   No driving for 2 weeks.  Do not take narcotics and drive.   Lifting restrictions      Comments:   No lifting greater than 10 lbs.   Sexual Activity Restrictions      Comments:   No sexual activity, nothing in the vagina, for 6 weeks.   Call MD for:  temperature >100.4      Call MD for:  persistant  nausea and vomiting      Call MD for:  severe uncontrolled pain      Call MD for:  redness, tenderness, or signs of infection (pain, swelling, redness, odor or green/yellow discharge around incision site)      Call MD for:  difficulty breathing, headache or visual disturbances      Call MD for:  hives      Call MD for:  persistant dizziness or light-headedness      Call MD for:  extreme fatigue          Medication List     As of 05/06/2012  9:18 AM    STOP taking these medications         HYDROcodone-acetaminophen 5-500 MG per tablet   Commonly known as: VICODIN      TAKE these medications         multivitamin with minerals tablet   Take 1 tablet by mouth daily.      oxyCODONE-acetaminophen 5-325 MG per tablet   Commonly known as: PERCOCET/ROXICET   Take 1-2 tablets by mouth every 4 (four) hours as needed for pain.           Follow-up Information    Please follow up. (Come to the GYN Clinic at the [facility] on Monday, Nov. 4 around 10am for staple removal and suprapubic teaching. )         Signed: CROSS, MELISSA DEAL 05/06/2012, 9:18 AM

## 2012-05-11 ENCOUNTER — Emergency Department (HOSPITAL_COMMUNITY)
Admission: EM | Admit: 2012-05-11 | Discharge: 2012-05-11 | Disposition: A | Discharge status: Home / Self Care | Payer: BC Managed Care – PPO | Attending: Emergency Medicine | Admitting: Emergency Medicine

## 2012-05-11 ENCOUNTER — Encounter (HOSPITAL_COMMUNITY): Payer: Self-pay | Admitting: Emergency Medicine

## 2012-05-11 ENCOUNTER — Encounter: Payer: Self-pay | Admitting: *Deleted

## 2012-05-11 DIAGNOSIS — Z791 Long term (current) use of non-steroidal anti-inflammatories (NSAID): Secondary | ICD-10-CM | POA: Insufficient documentation

## 2012-05-11 DIAGNOSIS — L0390 Cellulitis, unspecified: Secondary | ICD-10-CM

## 2012-05-11 DIAGNOSIS — N99511 Cystostomy infection: Secondary | ICD-10-CM | POA: Insufficient documentation

## 2012-05-11 DIAGNOSIS — C539 Malignant neoplasm of cervix uteri, unspecified: Secondary | ICD-10-CM | POA: Insufficient documentation

## 2012-05-11 DIAGNOSIS — J45909 Unspecified asthma, uncomplicated: Secondary | ICD-10-CM | POA: Insufficient documentation

## 2012-05-11 DIAGNOSIS — F17200 Nicotine dependence, unspecified, uncomplicated: Secondary | ICD-10-CM | POA: Insufficient documentation

## 2012-05-11 DIAGNOSIS — Z87442 Personal history of urinary calculi: Secondary | ICD-10-CM | POA: Insufficient documentation

## 2012-05-11 LAB — URINE MICROSCOPIC-ADD ON

## 2012-05-11 LAB — BASIC METABOLIC PANEL
CO2: 28 mEq/L (ref 19–32)
Chloride: 96 mEq/L (ref 96–112)
Creatinine, Ser: 0.69 mg/dL (ref 0.50–1.10)
GFR calc Af Amer: >90 mL/min (ref >90)
Sodium: 134 mEq/L — ABNORMAL LOW (ref 135–145)

## 2012-05-11 LAB — CBC WITH DIFFERENTIAL/PLATELET
Basophils Absolute: 0 10*3/uL (ref 0.0–0.1)
Basophils Relative: 0 % (ref 0–1)
HCT: 28.3 % — ABNORMAL LOW (ref 36.0–46.0)
Lymphocytes Relative: 18 % (ref 12–46)
MCHC: 33.6 g/dL (ref 30.0–36.0)
Monocytes Absolute: 0.8 10*3/uL (ref 0.1–1.0)
Neutro Abs: 6.7 10*3/uL (ref 1.7–7.7)
Neutrophils Relative %: 71 % (ref 43–77)
Platelets: 486 10*3/uL — ABNORMAL HIGH (ref 150–400)
RDW: 13.3 % (ref 11.5–15.5)
WBC: 9.4 10*3/uL (ref 4.0–10.5)

## 2012-05-11 LAB — URINALYSIS, ROUTINE W REFLEX MICROSCOPIC
Glucose, UA: NEGATIVE mg/dL
Ketones, ur: NEGATIVE mg/dL
Nitrite: NEGATIVE
Protein, ur: 30 mg/dL — AB
Urobilinogen, UA: 1 mg/dL (ref 0.0–1.0)

## 2012-05-11 MED ORDER — CEPHALEXIN 500 MG PO CAPS: 500.0000 mg | ORAL_CAPSULE | Freq: Two times a day (BID) | ORAL | Status: DC

## 2012-05-11 MED ORDER — SODIUM CHLORIDE 0.9 % IV BOLUS (SEPSIS)
1000.0000 mL | Freq: Once | INTRAVENOUS | Status: AC
  Administered 2012-05-11: 1000 mL via INTRAVENOUS

## 2012-05-11 NOTE — ED Notes (Signed)
Pt alert, arrives from home, c/o problems with supra pubic catheter, pt believes it may have become dislodged, + urine output, resp even unlabored, skin pwd

## 2012-05-11 NOTE — Progress Notes (Signed)
Pt in as follow-up to ED visit last night.  Pt had c/o pain @ [facility] site. She was evaluated, given fluids and keflex.  Today the [facility] site has an expected amount of mild erythemia, no puruluent drainage. Her urine is clear and concentrated.  She voided 75 cc without difficulty with 40cc residual.  Bladder training reinforced and importance of record keeping stressed.  She will begin more vigilant recording and call us firday to see if [facility] can be removed. She is instructed to dirnk more fluids especially water such that her urine is pale yellow in color and not amber.  Understanding stated.  She will call us Friday.  Above noted to Dr Stanford Breed.

## 2012-05-11 NOTE — ED Provider Notes (Signed)
History     CSN: [id number]  Arrival date & time 05/11/12  0050   First MD Initiated Contact with Patient 05/11/12 0103      Chief Complaint  Patient presents with  . Catheter Problem     (Consider location/radiation/quality/duration/timing/severity/associated sxs/prior treatment) HPI Pt with recent hysterectomy due to cervical ca and suprapubic cath placement states today she noticed redness around site of cath and tenderness over bladder. Pt was concerned cath may have been displaced though draining urine. +fevers at home to 101. No cough, N/V/D.  Past Medical History  Diagnosis Date  . Asthma     recent bronchitis-rescue inhaler  . History of kidney stones   . Cancer   . Cervical cancer     Past Surgical History  Procedure Date  . Ectopic pregnancy surgery   . Essure tubal ligation 2010  . Leep   . Diagnostic laparoscopy 2007    ectopic  . Cervical conization w/bx 03/24/2012    Procedure: CONIZATION CERVIX WITH BIOPSY;  Surgeon: Adam Phenix, MD;  Location: [facility];  Service: Gynecology;  Laterality: N/A;  . Insertion of suprapubic catheter 05/03/2012    Procedure: INSERTION OF SUPRAPUBIC CATHETER;  Surgeon: Jeannette Corpus, MD;  Location: [facility];  Service: Gynecology;;  . Radical hysterectomy 05/03/2012    Procedure: RADICAL HYSTERECTOMY;  Surgeon: Jeannette Corpus, MD;  Location: [facility];  Service: Gynecology;;  transposition of the ovaries  . Lymphadenectomy 05/03/2012    Procedure: LYMPHADENECTOMY;  Surgeon: Jeannette Corpus, MD;  Location: [facility];  Service: Gynecology;  Laterality: Bilateral;  pelvic   . Abdominal hysterectomy     Family History  Problem Relation Age of Onset  . Diabetes Mother   . Hypertension Father   . Diabetes Sister     History  Substance Use Topics  . Smoking status: Current Every Day Smoker -- 0.5 packs/day for 19 years    Types: Cigarettes  . Smokeless tobacco: Never Used     Comment: smoking cessation  information given  . Alcohol Use: No    OB History    Grav Para Term Preterm Abortions TAB SAB Ect Mult Living   4 4 4       4       Review of Systems  Constitutional: Positive for fever and fatigue.  Respiratory: Negative for cough and shortness of breath.   Cardiovascular: Negative for chest pain.  Gastrointestinal: Positive for abdominal pain. Negative for nausea, vomiting and diarrhea.  Genitourinary: Negative for flank pain.  Musculoskeletal: Negative for back pain.  Skin: Positive for color change and wound. Negative for rash.  Neurological: Negative for dizziness, weakness, light-headedness, numbness and headaches.    Allergies  Review of patient's allergies indicates no known allergies.  Home Medications   Current Outpatient Rx  Name  Route  Sig  Dispense  Refill  . IBUPROFEN 200 MG PO TABS   Oral   Take 600 mg by mouth every 6 (six) hours as needed. For pain         . MULTI-VITAMIN/MINERALS PO TABS   Oral   Take 1 tablet by mouth daily.         . OXYCODONE-ACETAMINOPHEN 5-325 MG PO TABS   Oral   Take 1-2 tablets by mouth every 4 (four) hours as needed for pain.   60 tablet   0   . CEPHALEXIN 500 MG PO CAPS   Oral   Take 1 capsule (500 mg total) by mouth 2 (  two) times daily.   20 capsule   0     BP 134/84  Pulse 117  Temp 99.1 F (37.3 C) (Oral)  Resp 20  SpO2 97%  LMP 04/11/2012  Physical Exam  Nursing note and vitals reviewed. Constitutional: She is oriented to person, place, and time. She appears well-developed and well-nourished. No distress.  HENT:  Head: Normocephalic and atraumatic.  Mouth/Throat: Oropharynx is clear and moist.  Eyes: EOM are normal. Pupils are equal, round, and reactive to light.  Neck: Normal range of motion. Neck supple.  Cardiovascular: Normal rate and regular rhythm.   Pulmonary/Chest: Effort normal and breath sounds normal. No respiratory distress. She has no wheezes. She has no rales. She exhibits no  tenderness.  Abdominal: Soft. Bowel sounds are normal. She exhibits no distension and no mass. There is tenderness (mild suprapubic TTP). There is no rebound and no guarding.  Musculoskeletal: Normal range of motion. She exhibits no edema and no tenderness.  Neurological: She is alert and oriented to person, place, and time.  Skin: Skin is warm and dry. No rash noted. There is erythema.       Erythema around sutured suprapubic cath with mild purulence. Cath in place draining clear urine  Psychiatric: She has a normal mood and affect. Her behavior is normal.    ED Course  Procedures (including critical care time)  Labs Reviewed  URINALYSIS, ROUTINE W REFLEX MICROSCOPIC - Abnormal; Notable for the following:    Hgb urine dipstick MODERATE (*)     Protein, ur 30 (*)     Leukocytes, UA SMALL (*)     All other components within normal limits  CBC WITH DIFFERENTIAL - Abnormal; Notable for the following:    RBC 3.31 (*)     Hemoglobin 9.5 (*)     HCT 28.3 (*)     Platelets 486 (*)     All other components within normal limits  BASIC METABOLIC PANEL - Abnormal; Notable for the following:    Sodium 134 (*)     Potassium 3.1 (*)     Glucose, Bld 114 (*)     All other components within normal limits  URINE MICROSCOPIC-ADD ON - Abnormal; Notable for the following:    Crystals CA OXALATE CRYSTALS (*)     All other components within normal limits   No results found.   1. Cellulitis       MDM   Pt is comfortable in bed. Mild cellulitis at suprappubic cath insertion site. Advised to f/u with Dr Sharol Given and return to ED for worsening symptoms. Will start on keflex.        Loren Racer, MD 05/11/12 [phone]

## 2012-05-16 ENCOUNTER — Encounter: Payer: Self-pay | Admitting: Gynecologic Oncology

## 2012-05-16 NOTE — Progress Notes (Signed)
Pt to the clinic today for suprapubic catheter removal.  Mild erythema with no drainage noted around the insertion site.  Catheter removed per Telford Nab, RN with no difficulty.  Reportable signs and symptoms reviewed.  Instructed to call for any questions or concerns.

## 2012-06-17 ENCOUNTER — Encounter: Payer: Self-pay | Admitting: Gynecology

## 2012-06-17 ENCOUNTER — Ambulatory Visit: Payer: BC Managed Care – PPO | Attending: Gynecology | Admitting: Gynecology

## 2012-06-17 VITALS — BP 110/60 | HR 74 | Temp 98.6°F | Resp 18 | Ht 62.72 in | Wt 190.9 lb

## 2012-06-17 DIAGNOSIS — Z90710 Acquired absence of both cervix and uterus: Secondary | ICD-10-CM | POA: Insufficient documentation

## 2012-06-17 DIAGNOSIS — F17200 Nicotine dependence, unspecified, uncomplicated: Secondary | ICD-10-CM | POA: Insufficient documentation

## 2012-06-17 DIAGNOSIS — J45909 Unspecified asthma, uncomplicated: Secondary | ICD-10-CM | POA: Insufficient documentation

## 2012-06-17 DIAGNOSIS — Z09 Encounter for follow-up examination after completed treatment for conditions other than malignant neoplasm: Secondary | ICD-10-CM | POA: Insufficient documentation

## 2012-06-17 DIAGNOSIS — C539 Malignant neoplasm of cervix uteri, unspecified: Secondary | ICD-10-CM | POA: Insufficient documentation

## 2012-06-17 NOTE — Patient Instructions (Signed)
You may return to full levels of activity. Please return in 3 months for a followup visit.

## 2012-06-17 NOTE — Progress Notes (Signed)
Consult Note: Gyn-Onc   April Ayala 36 y.o. female  Chief Complaint  Patient presents with  . Cervical Cancer    Follow up    Interval History: The patient returns for her first postoperative checkup. She reports she is doing well and has returned to work full time. Bladder function is excellent. She denies any pelvic pain pressure vaginal bleeding or discharge.  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 Final pathology showed negative lymph nodes, negative margins, and cervical stromal invasion of less than 50%. The patient had an uncomplicated postoperative course.  Review of Systems:10 point review of systems is negative as noted above.   Vitals: Blood pressure 110/60, pulse 74, temperature 98.6 F (37 C), temperature source Oral, resp. rate 18, height 5' 2.72" (1.593 m), weight 190 lb 14.4 oz (86.592 kg), last menstrual period 04/11/2012.  Physical Exam: General : The patient is a healthy woman in no acute distress.  HEENT: normocephalic, extraoccular movements normal; neck is supple without thyromegally  Lynphnodes: Supraclavicular and inguinal nodes not enlarged  Abdomen: Soft, non-tender, no ascites, no organomegally, no masses, no hernias the Pfannenstiel is well healed Pelvic:  EGBUS: Normal female  Vagina: Normal, no lesions  Urethra and Bladder: Normal, non-tender  Cervix: Surgically absent  Uterus: Surgically absent  Bi-manual examination: Non-tender; no adenxal masses or nodularity  Rectal: normal sphincter tone, no masses, no blood  Lower extremities: No edema or varicosities. Normal range of motion    Assessment/Plan: Stage IB 1 squamous cell carcinoma the cervix October 2012. The patient has had a good postoperative recovery is given the okay to return to full levels of activity. Given the favorable surgical pathology report, no further therapy is recommended.  Patient  return in 3 months.  No Known Allergies  Past Medical History  Diagnosis Date  . Asthma     recent bronchitis-rescue inhaler  . History of kidney stones   . Cancer   . Cervical cancer     Past Surgical History  Procedure Date  . Ectopic pregnancy surgery   . Essure tubal ligation 2010  . Leep   . Diagnostic laparoscopy 2007    ectopic  . Cervical conization w/bx 03/24/2012    Procedure: CONIZATION CERVIX WITH BIOPSY;  Surgeon: Adam Phenix, MD;  Location: [facility];  Service: Gynecology;  Laterality: N/A;  . Insertion of suprapubic catheter 05/03/2012    Procedure: INSERTION OF SUPRAPUBIC CATHETER;  Surgeon: Jeannette Corpus, MD;  Location: [facility];  Service: Gynecology;;  . Radical hysterectomy 05/03/2012    Procedure: RADICAL HYSTERECTOMY;  Surgeon: Jeannette Corpus, MD;  Location: [facility];  Service: Gynecology;;  transposition of the ovaries  . Lymphadenectomy 05/03/2012    Procedure: LYMPHADENECTOMY;  Surgeon: Jeannette Corpus, MD;  Location: [facility];  Service: Gynecology;  Laterality: Bilateral;  pelvic   . Abdominal hysterectomy     Current Outpatient Prescriptions  Medication Sig Dispense Refill  . ibuprofen (ADVIL,MOTRIN) 200 MG tablet Take 600 mg by mouth every 6 (six) hours as needed. For pain      . cephALEXin (KEFLEX) 500 MG capsule Take 1 capsule (500 mg total) by mouth 2 (two) times daily.  20 capsule  0  . Multiple Vitamins-Minerals (MULTIVITAMIN WITH MINERALS) tablet Take 1 tablet by mouth daily.      Marland Kitchen oxyCODONE-acetaminophen (PERCOCET/ROXICET) 5-325 MG per tablet Take 1-2 tablets by mouth every 4 (four) hours as needed for pain.  60 tablet  0    History   Social History  . Marital Status: Married    Spouse Name: N/A    Number of Children: N/A  . Years of Education: N/A   Occupational History  . Not on file.   Social History Main Topics  . Smoking status: Current Every Day Smoker -- 0.5 packs/day for 19 years    Types: Cigarettes   . Smokeless tobacco: Never Used     Comment: smoking cessation information given  . Alcohol Use: No  . Drug Use: No  . Sexually Active: Yes    Birth Control/ Protection: Surgical   Other Topics Concern  . Not on file   Social History Narrative  . No narrative on file    Family History  Problem Relation Age of Onset  . Diabetes Mother   . Hypertension Father   . Diabetes Sister       Jeannette Corpus, MD 06/17/2012, 9:09 AM

## 2012-09-06 ENCOUNTER — Encounter (HOSPITAL_COMMUNITY): Payer: Self-pay | Admitting: Emergency Medicine

## 2012-09-06 ENCOUNTER — Emergency Department (HOSPITAL_COMMUNITY)
Admission: EM | Admit: 2012-09-06 | Discharge: 2012-09-06 | Disposition: A | Discharge status: Home / Self Care | Payer: BC Managed Care – PPO | Attending: Emergency Medicine | Admitting: Emergency Medicine

## 2012-09-06 DIAGNOSIS — Z87442 Personal history of urinary calculi: Secondary | ICD-10-CM | POA: Insufficient documentation

## 2012-09-06 DIAGNOSIS — J45909 Unspecified asthma, uncomplicated: Secondary | ICD-10-CM | POA: Insufficient documentation

## 2012-09-06 DIAGNOSIS — R112 Nausea with vomiting, unspecified: Secondary | ICD-10-CM | POA: Insufficient documentation

## 2012-09-06 DIAGNOSIS — R1033 Periumbilical pain: Secondary | ICD-10-CM | POA: Insufficient documentation

## 2012-09-06 DIAGNOSIS — F17200 Nicotine dependence, unspecified, uncomplicated: Secondary | ICD-10-CM | POA: Insufficient documentation

## 2012-09-06 DIAGNOSIS — R300 Dysuria: Secondary | ICD-10-CM | POA: Insufficient documentation

## 2012-09-06 DIAGNOSIS — Z8541 Personal history of malignant neoplasm of cervix uteri: Secondary | ICD-10-CM | POA: Insufficient documentation

## 2012-09-06 LAB — CBC WITH DIFFERENTIAL/PLATELET
Hemoglobin: 14.2 g/dL (ref 12.0–15.0)
Lymphocytes Relative: 35 % (ref 12–46)
Lymphs Abs: 1.6 10*3/uL (ref 0.7–4.0)
Monocytes Relative: 12 % (ref 3–12)
Neutrophils Relative %: 50 % (ref 43–77)
Platelets: 263 10*3/uL (ref 150–400)
RBC: 5.19 MIL/uL — ABNORMAL HIGH (ref 3.87–5.11)
WBC: 4.7 10*3/uL (ref 4.0–10.5)

## 2012-09-06 LAB — URINALYSIS, ROUTINE W REFLEX MICROSCOPIC
Bilirubin Urine: NEGATIVE
Ketones, ur: NEGATIVE mg/dL
Nitrite: NEGATIVE
Specific Gravity, Urine: 1.015 (ref 1.005–1.030)
Urobilinogen, UA: 0.2 mg/dL (ref 0.0–1.0)
pH: 6 (ref 5.0–8.0)

## 2012-09-06 LAB — COMPREHENSIVE METABOLIC PANEL
ALT: 37 U/L — ABNORMAL HIGH (ref 0–35)
Alkaline Phosphatase: 110 U/L (ref 39–117)
BUN: 10 mg/dL (ref 6–23)
CO2: 25 mEq/L (ref 19–32)
Chloride: 98 mEq/L (ref 96–112)
GFR calc Af Amer: >90 mL/min (ref >90)
GFR calc non Af Amer: >90 mL/min (ref >90)
Glucose, Bld: 91 mg/dL (ref 70–99)
Potassium: 3.2 mEq/L — ABNORMAL LOW (ref 3.5–5.1)
Sodium: 133 mEq/L — ABNORMAL LOW (ref 135–145)
Total Bilirubin: 0.3 mg/dL (ref 0.3–1.2)

## 2012-09-06 LAB — URINE MICROSCOPIC-ADD ON

## 2012-09-06 LAB — LIPASE, BLOOD: Lipase: 19 U/L (ref 11–59)

## 2012-09-06 MED ORDER — ONDANSETRON HCL 4 MG/2ML IJ SOLN
4.0000 mg | Freq: Once | INTRAMUSCULAR | Status: AC
  Administered 2012-09-06: 4 mg via INTRAVENOUS
  Filled 2012-09-06: qty 2

## 2012-09-06 MED ORDER — SODIUM CHLORIDE 0.9 % IV BOLUS (SEPSIS)
1000.0000 mL | Freq: Once | INTRAVENOUS | Status: AC
  Administered 2012-09-06: 1000 mL via INTRAVENOUS

## 2012-09-06 MED ORDER — PROMETHAZINE HCL 25 MG PO TABS: 25.0000 mg | ORAL_TABLET | Freq: Four times a day (QID) | ORAL | Status: DC | PRN

## 2012-09-06 MED ORDER — OXYCODONE-ACETAMINOPHEN 5-325 MG PO TABS: 2.0000 | ORAL_TABLET | ORAL | Status: DC | PRN

## 2012-09-06 MED ORDER — HYDROMORPHONE HCL PF 1 MG/ML IJ SOLN
1.0000 mg | Freq: Once | INTRAMUSCULAR | Status: DC
  Filled 2012-09-06: qty 1

## 2012-09-06 NOTE — ED Provider Notes (Signed)
History     CSN: [id number]  Arrival date & time 09/06/12  0031   First MD Initiated Contact with Patient 09/06/12 0037      Chief Complaint  Patient presents with  . Abdominal Pain  . Dysuria  . Nausea    (Consider location/radiation/quality/duration/timing/severity/associated sxs/prior treatment) HPI..... Periumbilical abdominal discomfort with associated nausea vomiting and diarrhea for several hours.  Nothing makes symptoms better or worse. Severity is mild to moderate. No radiation of pain. Pain is cramping. No vaginal bleeding, vaginal discharge, fever, sweats, chills, dysuria  Past Medical History  Diagnosis Date  . Asthma     recent bronchitis-rescue inhaler  . History of kidney stones   . Cancer   . Cervical cancer     Past Surgical History  Procedure Laterality Date  . Ectopic pregnancy surgery    . Essure tubal ligation  2010  . Leep    . Diagnostic laparoscopy  2007    ectopic  . Cervical conization w/bx  03/24/2012    Procedure: CONIZATION CERVIX WITH BIOPSY;  Surgeon: Adam Phenix, MD;  Location: [facility];  Service: Gynecology;  Laterality: N/A;  . Insertion of suprapubic catheter  05/03/2012    Procedure: INSERTION OF SUPRAPUBIC CATHETER;  Surgeon: Jeannette Corpus, MD;  Location: [facility];  Service: Gynecology;;  . Radical hysterectomy  05/03/2012    Procedure: RADICAL HYSTERECTOMY;  Surgeon: Jeannette Corpus, MD;  Location: [facility];  Service: Gynecology;;  transposition of the ovaries  . Lymphadenectomy  05/03/2012    Procedure: LYMPHADENECTOMY;  Surgeon: Jeannette Corpus, MD;  Location: [facility];  Service: Gynecology;  Laterality: Bilateral;  pelvic   . Abdominal hysterectomy      Family History  Problem Relation Age of Onset  . Diabetes Mother   . Hypertension Father   . Diabetes Sister     History  Substance Use Topics  . Smoking status: Current Every Day Smoker -- 0.50 packs/day for 19 years    Types: Cigarettes  . Smokeless  tobacco: Never Used     Comment: smoking cessation information given  . Alcohol Use: No    OB History   Grav Para Term Preterm Abortions TAB SAB Ect Mult Living   4 4 4       4       Review of Systems  All other systems reviewed and are negative.    Allergies  Review of patient's allergies indicates no known allergies.  Home Medications   Current Outpatient Rx  Name  Route  Sig  Dispense  Refill  . ibuprofen (ADVIL,MOTRIN) 200 MG tablet   Oral   Take 600 mg by mouth every 6 (six) hours as needed. For pain         . Multiple Vitamins-Minerals (MULTIVITAMIN WITH MINERALS) tablet   Oral   Take 1 tablet by mouth daily.         . cephALEXin (KEFLEX) 500 MG capsule   Oral   Take 1 capsule (500 mg total) by mouth 2 (two) times daily.   20 capsule   0   . oxyCODONE-acetaminophen (PERCOCET) 5-325 MG per tablet   Oral   Take 2 tablets by mouth every 4 (four) hours as needed for pain.   20 tablet   0   . oxyCODONE-acetaminophen (PERCOCET/ROXICET) 5-325 MG per tablet   Oral   Take 1-2 tablets by mouth every 4 (four) hours as needed for pain.   60 tablet   0   .  promethazine (PHENERGAN) 25 MG tablet   Oral   Take 1 tablet (25 mg total) by mouth every 6 (six) hours as needed for nausea.   20 tablet   0     BP 115/74  Pulse 79  Temp(Src) 97.9 F (36.6 C) (Oral)  Resp 14  Ht 5\' 3"  (1.6 m)  Wt 185 lb (83.915 kg)  BMI 32.78 kg/m2  SpO2 100%  LMP 04/11/2012  Physical Exam  Nursing note and vitals reviewed. Constitutional: She is oriented to person, place, and time. She appears well-developed and well-nourished.  HENT:  Head: Normocephalic and atraumatic.  Eyes: Conjunctivae and EOM are normal. Pupils are equal, round, and reactive to light.  Neck: Normal range of motion. Neck supple.  Cardiovascular: Normal rate, regular rhythm and normal heart sounds.   Pulmonary/Chest: Effort normal and breath sounds normal.  Abdominal: Soft. Bowel sounds are normal.   Very minimal periumbilical tenderness  Musculoskeletal: Normal range of motion.  Neurological: She is alert and oriented to person, place, and time.  Skin: Skin is warm and dry.  Psychiatric: She has a normal mood and affect.    ED Course  Procedures (including critical care time)  Labs Reviewed  URINALYSIS, ROUTINE W REFLEX MICROSCOPIC - Abnormal; Notable for the following:    Hgb urine dipstick TRACE (*)    All other components within normal limits  URINE MICROSCOPIC-ADD ON - Abnormal; Notable for the following:    Squamous Epithelial / LPF MANY (*)    All other components within normal limits  CBC WITH DIFFERENTIAL - Abnormal; Notable for the following:    RBC 5.19 (*)    All other components within normal limits  COMPREHENSIVE METABOLIC PANEL - Abnormal; Notable for the following:    Sodium 133 (*)    Potassium 3.2 (*)    ALT 37 (*)    All other components within normal limits  LIPASE, BLOOD   No results found.   1. Abdominal pain       MDM  Screening test negative. No acute abdomen. Better after IV fluids. Discharge  on Percocet #20 Phenergan 25 mg #20        Donnetta Hutching, MD 09/06/12 [phone]

## 2012-09-06 NOTE — ED Notes (Signed)
Pt c/o lower abd pain with nausea and dysuria.

## 2012-10-03 ENCOUNTER — Encounter: Payer: Self-pay | Admitting: Gynecology

## 2012-10-03 ENCOUNTER — Other Ambulatory Visit (HOSPITAL_COMMUNITY)
Admission: RE | Admit: 2012-10-03 | Discharge: 2012-10-03 | Disposition: A | Discharge status: Home / Self Care | Payer: BC Managed Care – PPO | Source: Ambulatory Visit | Attending: Gynecology | Admitting: Gynecology

## 2012-10-03 ENCOUNTER — Ambulatory Visit: Payer: BC Managed Care – PPO | Attending: Gynecology | Admitting: Gynecology

## 2012-10-03 VITALS — BP 110/64 | HR 80 | Temp 99.2°F | Resp 16 | Ht 62.72 in | Wt 203.3 lb

## 2012-10-03 DIAGNOSIS — C539 Malignant neoplasm of cervix uteri, unspecified: Secondary | ICD-10-CM | POA: Insufficient documentation

## 2012-10-03 DIAGNOSIS — Z01419 Encounter for gynecological examination (general) (routine) without abnormal findings: Secondary | ICD-10-CM | POA: Insufficient documentation

## 2012-10-03 NOTE — Progress Notes (Signed)
Consult Note: Gyn-Onc   April Ayala 37 y.o. female  Chief Complaint  Patient presents with  . 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 No evidence disease. Intermittent lower nominal pain possibly secondary to adhesions.  Plan: Pap smears are obtained. The patient return to see [place] in 3 months. She'll contact us if her pain worsens.  Interval History: Patient returns today as previously scheduled. In the interval she's done well. She has had some intermittent episodes of lower nominal pain. She apparently was evaluated in the emergency room but no diagnosis was established. The pain is not continuous is very intermittent. His recall that her ovaries were left in place at the time of hysterectomy. She denies any GI or GU symptoms has no vaginal or pelvic pain.  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 Final pathology showed negative lymph nodes negative margins and cervical stroma invasion of less than 50%. No postoperative therapy was advised. Review of Systems:10 point review of systems is negative except as noted in interval history.   Vitals: Blood pressure 110/64, pulse 80, temperature 99.2 F (37.3 C), resp. rate 16, height 5' 2.72" (1.593 m), weight 203 lb 4.8 oz (92.216 kg), last menstrual period 04/11/2012.  Physical Exam: General : The patient is a healthy woman in no acute distress.  HEENT: normocephalic, extraoccular movements normal; neck is supple without thyromegally  Lynphnodes: Supraclavicular and inguinal nodes not enlarged  Abdomen: Soft, non-tender, no ascites, no organomegally, no masses, no hernias  Pelvic:  EGBUS: Normal female  Vagina: Normal, no lesions  Urethra and Bladder: Normal, non-tender  Cervix: Surgically absent  Uterus: Surgically absent  Bi-manual examination: Non-tender; no adenxal masses or nodularity   Rectal: normal sphincter tone, no masses, no blood  Lower extremities: No edema or varicosities. Normal range of motion      No Known Allergies  Past Medical History  Diagnosis Date  . Asthma     recent bronchitis-rescue inhaler  . History of kidney stones   . Cancer   . Cervical cancer     Past Surgical History  Procedure Laterality Date  . Ectopic pregnancy surgery    . Essure tubal ligation  2010  . Leep    . Diagnostic laparoscopy  2007    ectopic  . Cervical conization w/bx  03/24/2012    Procedure: CONIZATION CERVIX WITH BIOPSY;  Surgeon: Adam Phenix, MD;  Location: [facility];  Service: Gynecology;  Laterality: N/A;  . Insertion of suprapubic catheter  05/03/2012    Procedure: INSERTION OF SUPRAPUBIC CATHETER;  Surgeon: Jeannette Corpus, MD;  Location: [facility];  Service: Gynecology;;  . Radical hysterectomy  05/03/2012    Procedure: RADICAL HYSTERECTOMY;  Surgeon: Jeannette Corpus, MD;  Location: [facility];  Service: Gynecology;;  transposition of the ovaries  . Lymphadenectomy  05/03/2012    Procedure: LYMPHADENECTOMY;  Surgeon: Jeannette Corpus, MD;  Location: [facility];  Service: Gynecology;  Laterality: Bilateral;  pelvic   . Abdominal hysterectomy      Current Outpatient Prescriptions  Medication Sig Dispense Refill  . ibuprofen (ADVIL,MOTRIN) 200 MG tablet Take 600 mg by mouth every 6 (six) hours as needed. For pain      . cephALEXin (KEFLEX) 500 MG capsule Take 1 capsule (500 mg total) by mouth 2 (two) times daily.  20 capsule  0  .  Multiple Vitamins-Minerals (MULTIVITAMIN WITH MINERALS) tablet Take 1 tablet by mouth daily.      Marland Kitchen oxyCODONE-acetaminophen (PERCOCET) 5-325 MG per tablet Take 2 tablets by mouth every 4 (four) hours as needed for pain.  20 tablet  0  . oxyCODONE-acetaminophen (PERCOCET) 5-325 MG per tablet Take 2 tablets by mouth every 4 (four) hours as needed for pain.  20 tablet  0  . oxyCODONE-acetaminophen (PERCOCET/ROXICET)  5-325 MG per tablet Take 1-2 tablets by mouth every 4 (four) hours as needed for pain.  60 tablet  0  . promethazine (PHENERGAN) 25 MG tablet Take 1 tablet (25 mg total) by mouth every 6 (six) hours as needed for nausea.  20 tablet  0  . promethazine (PHENERGAN) 25 MG tablet Take 1 tablet (25 mg total) by mouth every 6 (six) hours as needed for nausea.  20 tablet  0   No current facility-administered medications for this visit.    History   Social History  . Marital Status: Married    Spouse Name: N/A    Number of Children: N/A  . Years of Education: N/A   Occupational History  . Not on file.   Social History Main Topics  . Smoking status: Current Every Day Smoker -- 0.50 packs/day for 19 years    Types: Cigarettes  . Smokeless tobacco: Never Used     Comment: smoking cessation information given  . Alcohol Use: No  . Drug Use: No  . Sexually Active: Yes    Birth Control/ Protection: Surgical   Other Topics Concern  . Not on file   Social History Narrative  . No narrative on file    Family History  Problem Relation Age of Onset  . Diabetes Mother   . Hypertension Father   . Diabetes Sister       Jeannette Corpus, MD 10/03/2012, 9:46 AM

## 2012-10-03 NOTE — Patient Instructions (Signed)
We will contact you the Pap smear report. Return to see [place] in 3 months.

## 2012-10-07 ENCOUNTER — Telehealth: Payer: Self-pay | Admitting: *Deleted

## 2012-10-07 NOTE — Telephone Encounter (Signed)
Notified patient of Pap results 

## 2012-12-14 ENCOUNTER — Encounter (HOSPITAL_COMMUNITY): Payer: Self-pay | Admitting: Emergency Medicine

## 2012-12-14 ENCOUNTER — Emergency Department (HOSPITAL_COMMUNITY)
Admission: EM | Admit: 2012-12-14 | Discharge: 2012-12-14 | Disposition: A | Discharge status: Home / Self Care | Payer: BC Managed Care – PPO | Attending: Emergency Medicine | Admitting: Emergency Medicine

## 2012-12-14 DIAGNOSIS — R319 Hematuria, unspecified: Secondary | ICD-10-CM | POA: Insufficient documentation

## 2012-12-14 DIAGNOSIS — Z3202 Encounter for pregnancy test, result negative: Secondary | ICD-10-CM | POA: Insufficient documentation

## 2012-12-14 DIAGNOSIS — F17200 Nicotine dependence, unspecified, uncomplicated: Secondary | ICD-10-CM | POA: Insufficient documentation

## 2012-12-14 DIAGNOSIS — Z859 Personal history of malignant neoplasm, unspecified: Secondary | ICD-10-CM | POA: Insufficient documentation

## 2012-12-14 DIAGNOSIS — J45909 Unspecified asthma, uncomplicated: Secondary | ICD-10-CM | POA: Insufficient documentation

## 2012-12-14 DIAGNOSIS — Z9851 Tubal ligation status: Secondary | ICD-10-CM | POA: Insufficient documentation

## 2012-12-14 DIAGNOSIS — R300 Dysuria: Secondary | ICD-10-CM | POA: Insufficient documentation

## 2012-12-14 DIAGNOSIS — Z8541 Personal history of malignant neoplasm of cervix uteri: Secondary | ICD-10-CM | POA: Insufficient documentation

## 2012-12-14 DIAGNOSIS — Z87442 Personal history of urinary calculi: Secondary | ICD-10-CM | POA: Insufficient documentation

## 2012-12-14 DIAGNOSIS — R3919 Other difficulties with micturition: Secondary | ICD-10-CM | POA: Insufficient documentation

## 2012-12-14 DIAGNOSIS — Z90710 Acquired absence of both cervix and uterus: Secondary | ICD-10-CM | POA: Insufficient documentation

## 2012-12-14 DIAGNOSIS — R110 Nausea: Secondary | ICD-10-CM | POA: Insufficient documentation

## 2012-12-14 DIAGNOSIS — R109 Unspecified abdominal pain: Secondary | ICD-10-CM

## 2012-12-14 LAB — URINALYSIS, ROUTINE W REFLEX MICROSCOPIC
Bilirubin Urine: NEGATIVE
Glucose, UA: NEGATIVE mg/dL
Hgb urine dipstick: NEGATIVE
Ketones, ur: NEGATIVE mg/dL
Leukocytes, UA: NEGATIVE
Nitrite: NEGATIVE
Protein, ur: NEGATIVE mg/dL
Specific Gravity, Urine: >1.03 — ABNORMAL HIGH (ref 1.005–1.030)
Urobilinogen, UA: 0.2 mg/dL (ref 0.0–1.0)
pH: 6 (ref 5.0–8.0)

## 2012-12-14 LAB — PREGNANCY, URINE: Preg Test, Ur: NEGATIVE

## 2012-12-14 MED ORDER — IBUPROFEN 400 MG PO TABS
600.0000 mg | ORAL_TABLET | Freq: Once | ORAL | Status: AC
  Administered 2012-12-14: 600 mg via ORAL
  Filled 2012-12-14: qty 2

## 2012-12-14 MED ORDER — OXYCODONE-ACETAMINOPHEN 5-325 MG PO TABS: 1.0000 | ORAL_TABLET | ORAL | Status: DC | PRN

## 2012-12-14 MED ORDER — OXYCODONE-ACETAMINOPHEN 5-325 MG PO TABS
2.0000 | ORAL_TABLET | Freq: Once | ORAL | Status: AC
  Administered 2012-12-14: 2 via ORAL
  Filled 2012-12-14: qty 2

## 2012-12-14 NOTE — ED Notes (Signed)
Pt c/o lower abd pain for awhile, but has gotten worse over the last couple of day. Pt c/o pain when she bends over or uses the restroom.

## 2012-12-14 NOTE — ED Provider Notes (Signed)
History  This chart was scribed for Raeford Razor, MD by Ardelia Mems, ED Scribe. This patient was seen in room APA07/APA07 and the patient's care was started at 9:26 PM.   CSN: [id number]  Arrival date & time 12/14/12  1911   First MD Initiated Contact with Patient 12/14/12 2119      Chief Complaint  Patient presents with  . Abdominal Pain     The history is provided by the patient. No language interpreter was used.    HPI Comments: April Ayala is a 37 y.o. female who presents to the Emergency Department complaining of 2 weeks of gradually worsening, intermittent, moderate, sharp suprapubic abdominal pain that radiates to LLQ. Pt states that her pain is worse when she bends over. There is associated dysuria, difficulty urinating, hematuria and nausea. Pt has a h/o cervical cancer and states that she had a hysterectomy in October 2013. Pt has tried Ibuprofen with some relief of pain. Pt denies vomiting, diarrhea, fever, chills or any other symptoms.   Past Medical History  Diagnosis Date  . Asthma     recent bronchitis-rescue inhaler  . History of kidney stones   . Cancer   . Cervical cancer     Past Surgical History  Procedure Laterality Date  . Ectopic pregnancy surgery    . Essure tubal ligation  2010  . Leep    . Diagnostic laparoscopy  2007    ectopic  . Cervical conization w/bx  03/24/2012    Procedure: CONIZATION CERVIX WITH BIOPSY;  Surgeon: Adam Phenix, MD;  Location: [facility];  Service: Gynecology;  Laterality: N/A;  . Insertion of suprapubic catheter  05/03/2012    Procedure: INSERTION OF SUPRAPUBIC CATHETER;  Surgeon: Jeannette Corpus, MD;  Location: [facility];  Service: Gynecology;;  . Radical hysterectomy  05/03/2012    Procedure: RADICAL HYSTERECTOMY;  Surgeon: Jeannette Corpus, MD;  Location: [facility];  Service: Gynecology;;  transposition of the ovaries  . Lymphadenectomy  05/03/2012    Procedure: LYMPHADENECTOMY;  Surgeon: Jeannette Corpus, MD;  Location: [facility];  Service: Gynecology;  Laterality: Bilateral;  pelvic   . Abdominal hysterectomy      Family History  Problem Relation Age of Onset  . Diabetes Mother   . Hypertension Father   . Diabetes Sister     History  Substance Use Topics  . Smoking status: Current Every Day Smoker -- 0.50 packs/day for 19 years    Types: Cigarettes  . Smokeless tobacco: Never Used     Comment: smoking cessation information given  . Alcohol Use: No    OB History   Grav Para Term Preterm Abortions TAB SAB Ect Mult Living   4 4 4       4       Review of Systems  Constitutional: Negative for fever and chills.  Gastrointestinal: Positive for nausea and abdominal pain. Negative for vomiting and diarrhea.  Genitourinary: Positive for dysuria, hematuria and difficulty urinating.   A complete 10 system review of systems was obtained and all systems are negative except as noted in the HPI and PMH.   Allergies  Review of patient's allergies indicates no known allergies.  Home Medications   Current Outpatient Rx  Name  Route  Sig  Dispense  Refill  . cephALEXin (KEFLEX) 500 MG capsule   Oral   Take 1 capsule (500 mg total) by mouth 2 (two) times daily.   20 capsule   0   .  ibuprofen (ADVIL,MOTRIN) 200 MG tablet   Oral   Take 600 mg by mouth every 6 (six) hours as needed. For pain         . Multiple Vitamins-Minerals (MULTIVITAMIN WITH MINERALS) tablet   Oral   Take 1 tablet by mouth daily.         Marland Kitchen oxyCODONE-acetaminophen (PERCOCET) 5-325 MG per tablet   Oral   Take 2 tablets by mouth every 4 (four) hours as needed for pain.   20 tablet   0   . oxyCODONE-acetaminophen (PERCOCET) 5-325 MG per tablet   Oral   Take 2 tablets by mouth every 4 (four) hours as needed for pain.   20 tablet   0   . oxyCODONE-acetaminophen (PERCOCET/ROXICET) 5-325 MG per tablet   Oral   Take 1-2 tablets by mouth every 4 (four) hours as needed for pain.   60 tablet    0   . promethazine (PHENERGAN) 25 MG tablet   Oral   Take 1 tablet (25 mg total) by mouth every 6 (six) hours as needed for nausea.   20 tablet   0   . promethazine (PHENERGAN) 25 MG tablet   Oral   Take 1 tablet (25 mg total) by mouth every 6 (six) hours as needed for nausea.   20 tablet   0     Triage Vitals: BP 126/79  Pulse 110  Temp(Src) 98.4 F (36.9 C) (Oral)  Resp 20  Ht 5\' 3"  (1.6 m)  Wt 185 lb (83.915 kg)  BMI 32.78 kg/m2  SpO2 98%  LMP 04/11/2012  Physical Exam  Nursing note and vitals reviewed. Constitutional: She is oriented to person, place, and time. She appears well-developed and well-nourished. No distress.  HENT:  Head: Normocephalic and atraumatic.  Eyes: EOM are normal.  Neck: Normal range of motion.  Cardiovascular: Normal rate, regular rhythm and normal heart sounds.   Pulmonary/Chest: Effort normal and breath sounds normal.  Abdominal: Soft. She exhibits no distension. There is no tenderness.  No CVA tenderness.  Musculoskeletal: Normal range of motion.  Neurological: She is alert and oriented to person, place, and time.  Skin: Skin is warm and dry.  Psychiatric: She has a normal mood and affect. Judgment normal.    DIAGNOSTIC STUDIES: Oxygen Saturation is 98% on RA, normal by my interpretation.    COORDINATION OF CARE: 9:33 PM- Pt advised of plan for treatment and pt agrees.   Medications  oxyCODONE-acetaminophen (PERCOCET/ROXICET) 5-325 MG per tablet 2 tablet (2 tablets Oral Given 12/14/12 2142)  ibuprofen (ADVIL,MOTRIN) tablet 600 mg (600 mg Oral Given 12/14/12 2142)     ED Course  Procedures (including critical care time)  Labs Reviewed  URINALYSIS, ROUTINE W REFLEX MICROSCOPIC - Abnormal; Notable for the following:    Specific Gravity, Urine >1.030 (*)    All other components within normal limits  PREGNANCY, URINE   No results found.   1. Abdominal pain       MDM  36yF with abdominal pain. Benign exam. Clean UA. Low  suspicion for emergent process.    I personally preformed the services scribed in my presence. The recorded information has been reviewed is accurate. Raeford Razor, MD.    Raeford Razor, MD 12/20/12 [phone]

## 2012-12-30 ENCOUNTER — Encounter: Payer: Self-pay | Admitting: Gynecology

## 2012-12-30 ENCOUNTER — Ambulatory Visit: Payer: BC Managed Care – PPO | Attending: Gynecology | Admitting: Gynecology

## 2012-12-30 ENCOUNTER — Other Ambulatory Visit (HOSPITAL_COMMUNITY)
Admission: RE | Admit: 2012-12-30 | Discharge: 2012-12-30 | Disposition: A | Discharge status: Home / Self Care | Payer: BC Managed Care – PPO | Source: Ambulatory Visit | Attending: Gynecology | Admitting: Gynecology

## 2012-12-30 ENCOUNTER — Encounter: Payer: Self-pay | Admitting: *Deleted

## 2012-12-30 VITALS — BP 120/60 | HR 80 | Temp 99.0°F | Resp 20 | Ht 62.72 in | Wt 202.5 lb

## 2012-12-30 DIAGNOSIS — Z01419 Encounter for gynecological examination (general) (routine) without abnormal findings: Secondary | ICD-10-CM | POA: Insufficient documentation

## 2012-12-30 DIAGNOSIS — R109 Unspecified abdominal pain: Secondary | ICD-10-CM | POA: Insufficient documentation

## 2012-12-30 DIAGNOSIS — Z90710 Acquired absence of both cervix and uterus: Secondary | ICD-10-CM | POA: Insufficient documentation

## 2012-12-30 DIAGNOSIS — C539 Malignant neoplasm of cervix uteri, unspecified: Secondary | ICD-10-CM | POA: Insufficient documentation

## 2012-12-30 NOTE — Patient Instructions (Addendum)
CT scan is scheduled. 

## 2012-12-30 NOTE — Progress Notes (Signed)
Consult Note: Gyn-Onc   April Ayala 37 y.o. female  Chief Complaint  Patient presents with  . 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 No evidence disease. Intermittent lower abdominal pain possibly secondary to adhesions.  Plan: Pap smears are obtained. Given that this pain remained persistent, we will schedule a CT scan of the abdomen and pelvis to exclude the possibility of adenopathy or other pathology.  Interval History: Patient returns today as previously scheduled. In the interval she's done well. She has had some intermittent episodes of lower nominal pain. Apparently this became worse approximately 2 weeks ago she presented to the emergency room. It is recall that  her ovaries were left in place at the time of hysterectomy. She denies any GI or GU symptoms has no vaginal or pelvic pain.  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 Final pathology showed negative lymph nodes negative margins and cervical stroma invasion of less than 50%. No postoperative therapy was advised. Review of Systems:10 point review of systems is negative except as noted in interval history.   Vitals: Blood pressure 120/60, pulse 80, temperature 99 F (37.2 C), temperature source Oral, resp. rate 20, height 5' 2.72" (1.593 m), weight 202 lb 8 oz (91.853 kg), last menstrual period 04/11/2012.  Physical Exam: General : The patient is a healthy woman in no acute distress.  HEENT: normocephalic, extraoccular movements normal; neck is supple without thyromegally  Lynphnodes: Supraclavicular and inguinal nodes not enlarged  Abdomen: Soft, non-tender, no ascites, no organomegally, no masses, no hernias  Pelvic:  EGBUS: Normal female  Vagina: Normal, no lesions  Urethra and Bladder: Normal, non-tender  Cervix: Surgically absent  Uterus: Surgically absent   Bi-manual examination: Non-tender; no adenxal masses or nodularity  Rectal: normal sphincter tone, no masses, no blood  Lower extremities: No edema or varicosities. Normal range of motion      No Known Allergies  Past Medical History  Diagnosis Date  . Asthma     recent bronchitis-rescue inhaler  . History of kidney stones   . Cancer   . Cervical cancer     Past Surgical History  Procedure Laterality Date  . Ectopic pregnancy surgery    . Essure tubal ligation  2010  . Leep    . Diagnostic laparoscopy  2007    ectopic  . Cervical conization w/bx  03/24/2012    Procedure: CONIZATION CERVIX WITH BIOPSY;  Surgeon: Adam Phenix, MD;  Location: [facility];  Service: Gynecology;  Laterality: N/A;  . Insertion of suprapubic catheter  05/03/2012    Procedure: INSERTION OF SUPRAPUBIC CATHETER;  Surgeon: Jeannette Corpus, MD;  Location: [facility];  Service: Gynecology;;  . Radical hysterectomy  05/03/2012    Procedure: RADICAL HYSTERECTOMY;  Surgeon: Jeannette Corpus, MD;  Location: [facility];  Service: Gynecology;;  transposition of the ovaries  . Lymphadenectomy  05/03/2012    Procedure: LYMPHADENECTOMY;  Surgeon: Jeannette Corpus, MD;  Location: [facility];  Service: Gynecology;  Laterality: Bilateral;  pelvic   . Abdominal hysterectomy      Current Outpatient Prescriptions  Medication Sig Dispense Refill  . ibuprofen (ADVIL,MOTRIN) 200 MG tablet Take 800 mg by mouth every 6 (six) hours as needed. For pain      . Multiple Vitamins-Minerals (MULTIVITAMIN WITH MINERALS) tablet Take 1 tablet by mouth daily.      Marland Kitchen  oxyCODONE-acetaminophen (PERCOCET/ROXICET) 5-325 MG per tablet Take 1 tablet by mouth every 4 (four) hours as needed for pain.  10 tablet  0   No current facility-administered medications for this visit.    History   Social History  . Marital Status: Married    Spouse Name: N/A    Number of Children: N/A  . Years of Education: N/A   Occupational History   . Not on file.   Social History Main Topics  . Smoking status: Current Every Day Smoker -- 0.50 packs/day for 19 years    Types: Cigarettes  . Smokeless tobacco: Never Used     Comment: smoking cessation information given  . Alcohol Use: No  . Drug Use: No  . Sexually Active: Yes    Birth Control/ Protection: Surgical   Other Topics Concern  . Not on file   Social History Narrative  . No narrative on file    Family History  Problem Relation Age of Onset  . Diabetes Mother   . Hypertension Father   . Diabetes Sister       Jeannette Corpus, MD 12/30/2012, 2:52 PM

## 2012-12-30 NOTE — Addendum Note (Signed)
Addended by: Earlean Polka on: 12/30/2012 03:37 PM   Modules accepted: Orders

## 2013-01-02 ENCOUNTER — Encounter: Payer: Self-pay | Admitting: Gynecologic Oncology

## 2013-01-02 ENCOUNTER — Other Ambulatory Visit: Payer: Self-pay | Admitting: Gynecologic Oncology

## 2013-01-02 DIAGNOSIS — C539 Malignant neoplasm of cervix uteri, unspecified: Secondary | ICD-10-CM

## 2013-01-04 ENCOUNTER — Telehealth: Payer: Self-pay | Admitting: Gynecologic Oncology

## 2013-01-04 NOTE — Telephone Encounter (Signed)
Left message with pap smear results.  Informed that yeast was present.  Instructed that she could use over the counter monistat to treat the infection.  Instructed to please call the office so that her CT scan of the abdomen/pelvis could be arranged.

## 2013-01-10 ENCOUNTER — Telehealth: Payer: Self-pay | Admitting: *Deleted

## 2013-01-10 NOTE — Telephone Encounter (Signed)
Called pt to give C.T. Scan  Appointment. Pt stated that the planned appointment time was not good because of her work schedule. Pt will call [facility] to reschedule her appointment to accommodate her work schedule. The number was given to [facility] and pt was asked to call the office to notify us of the new appt once it has been scheduled.

## 2013-01-12 ENCOUNTER — Ambulatory Visit (HOSPITAL_COMMUNITY): Payer: BC Managed Care – PPO

## 2013-01-18 ENCOUNTER — Ambulatory Visit (HOSPITAL_COMMUNITY)
Admission: RE | Admit: 2013-01-18 | Discharge: 2013-01-18 | Disposition: A | Discharge status: Home / Self Care | Payer: BC Managed Care – PPO | Source: Ambulatory Visit | Attending: Gynecologic Oncology | Admitting: Gynecologic Oncology

## 2013-01-18 ENCOUNTER — Encounter (HOSPITAL_COMMUNITY): Payer: Self-pay

## 2013-01-18 DIAGNOSIS — K8020 Calculus of gallbladder without cholecystitis without obstruction: Secondary | ICD-10-CM | POA: Insufficient documentation

## 2013-01-18 DIAGNOSIS — N83209 Unspecified ovarian cyst, unspecified side: Secondary | ICD-10-CM | POA: Insufficient documentation

## 2013-01-18 DIAGNOSIS — Z8541 Personal history of malignant neoplasm of cervix uteri: Secondary | ICD-10-CM | POA: Insufficient documentation

## 2013-01-18 DIAGNOSIS — Z90710 Acquired absence of both cervix and uterus: Secondary | ICD-10-CM | POA: Insufficient documentation

## 2013-01-18 DIAGNOSIS — N134 Hydroureter: Secondary | ICD-10-CM | POA: Insufficient documentation

## 2013-01-18 DIAGNOSIS — M4310 Spondylolisthesis, site unspecified: Secondary | ICD-10-CM | POA: Insufficient documentation

## 2013-01-18 DIAGNOSIS — C539 Malignant neoplasm of cervix uteri, unspecified: Secondary | ICD-10-CM

## 2013-01-18 MED ORDER — IOHEXOL 300 MG/ML  SOLN
100.0000 mL | Freq: Once | INTRAMUSCULAR | Status: AC | PRN
  Administered 2013-01-18: 100 mL via INTRAVENOUS

## 2013-01-20 ENCOUNTER — Encounter: Payer: Self-pay | Admitting: Gynecologic Oncology

## 2013-01-20 ENCOUNTER — Emergency Department (HOSPITAL_COMMUNITY): Payer: BC Managed Care – PPO

## 2013-01-20 ENCOUNTER — Emergency Department (HOSPITAL_COMMUNITY)
Admission: EM | Admit: 2013-01-20 | Discharge: 2013-01-20 | Disposition: A | Discharge status: Home / Self Care | Payer: BC Managed Care – PPO | Attending: Emergency Medicine | Admitting: Emergency Medicine

## 2013-01-20 ENCOUNTER — Encounter (HOSPITAL_COMMUNITY): Payer: Self-pay

## 2013-01-20 ENCOUNTER — Telehealth: Payer: Self-pay | Admitting: Gynecologic Oncology

## 2013-01-20 DIAGNOSIS — F17200 Nicotine dependence, unspecified, uncomplicated: Secondary | ICD-10-CM | POA: Insufficient documentation

## 2013-01-20 DIAGNOSIS — R509 Fever, unspecified: Secondary | ICD-10-CM | POA: Insufficient documentation

## 2013-01-20 DIAGNOSIS — R110 Nausea: Secondary | ICD-10-CM | POA: Insufficient documentation

## 2013-01-20 DIAGNOSIS — N83209 Unspecified ovarian cyst, unspecified side: Secondary | ICD-10-CM | POA: Insufficient documentation

## 2013-01-20 DIAGNOSIS — R109 Unspecified abdominal pain: Secondary | ICD-10-CM

## 2013-01-20 DIAGNOSIS — R1032 Left lower quadrant pain: Secondary | ICD-10-CM | POA: Insufficient documentation

## 2013-01-20 DIAGNOSIS — N83202 Unspecified ovarian cyst, left side: Secondary | ICD-10-CM

## 2013-01-20 DIAGNOSIS — K8020 Calculus of gallbladder without cholecystitis without obstruction: Secondary | ICD-10-CM | POA: Insufficient documentation

## 2013-01-20 DIAGNOSIS — Z8541 Personal history of malignant neoplasm of cervix uteri: Secondary | ICD-10-CM | POA: Insufficient documentation

## 2013-01-20 DIAGNOSIS — J45909 Unspecified asthma, uncomplicated: Secondary | ICD-10-CM | POA: Insufficient documentation

## 2013-01-20 DIAGNOSIS — Z87442 Personal history of urinary calculi: Secondary | ICD-10-CM | POA: Insufficient documentation

## 2013-01-20 LAB — BASIC METABOLIC PANEL
CO2: 29 mEq/L (ref 19–32)
Calcium: 9.2 mg/dL (ref 8.4–10.5)
Creatinine, Ser: 0.86 mg/dL (ref 0.50–1.10)
GFR calc Af Amer: >90 mL/min (ref >90)
GFR calc non Af Amer: 87 mL/min — ABNORMAL LOW (ref >90)
Sodium: 134 mEq/L — ABNORMAL LOW (ref 135–145)

## 2013-01-20 LAB — CBC
Hemoglobin: 14.3 g/dL (ref 12.0–15.0)
Platelets: 305 10*3/uL (ref 150–400)
RBC: 4.82 MIL/uL (ref 3.87–5.11)
WBC: 18.1 10*3/uL — ABNORMAL HIGH (ref 4.0–10.5)

## 2013-01-20 LAB — URINALYSIS, ROUTINE W REFLEX MICROSCOPIC
Glucose, UA: NEGATIVE mg/dL
Ketones, ur: NEGATIVE mg/dL
Leukocytes, UA: NEGATIVE
pH: 6.5 (ref 5.0–8.0)

## 2013-01-20 LAB — URINE MICROSCOPIC-ADD ON

## 2013-01-20 LAB — LACTIC ACID, PLASMA: Lactic Acid, Venous: 1.4 mmol/L (ref 0.5–2.2)

## 2013-01-20 MED ORDER — FENTANYL CITRATE 0.05 MG/ML IJ SOLN: 50.0000 ug | INTRAMUSCULAR | Status: DC | PRN

## 2013-01-20 MED ORDER — SODIUM CHLORIDE 0.9 % IV BOLUS (SEPSIS)
1000.0000 mL | Freq: Once | INTRAVENOUS | Status: AC
  Administered 2013-01-20: 1000 mL via INTRAVENOUS

## 2013-01-20 MED ORDER — HYDROMORPHONE HCL PF 1 MG/ML IJ SOLN
0.5000 mg | Freq: Once | INTRAMUSCULAR | Status: AC
  Administered 2013-01-20: 0.5 mg via INTRAVENOUS
  Filled 2013-01-20: qty 1

## 2013-01-20 MED ORDER — OXYCODONE-ACETAMINOPHEN 5-325 MG PO TABS: 2.0000 | ORAL_TABLET | ORAL | Status: DC | PRN

## 2013-01-20 MED ORDER — HYDROMORPHONE HCL PF 1 MG/ML IJ SOLN
1.0000 mg | Freq: Once | INTRAMUSCULAR | Status: AC
  Administered 2013-01-20: 1 mg via INTRAVENOUS
  Filled 2013-01-20: qty 1

## 2013-01-20 MED ORDER — LEVOFLOXACIN IN D5W 750 MG/150ML IV SOLN
750.0000 mg | INTRAVENOUS | Status: DC
  Administered 2013-01-20: 750 mg via INTRAVENOUS
  Filled 2013-01-20: qty 150

## 2013-01-20 MED ORDER — PROMETHAZINE HCL 25 MG PO TABS: 25.0000 mg | ORAL_TABLET | Freq: Four times a day (QID) | ORAL | Status: DC | PRN

## 2013-01-20 MED ORDER — ONDANSETRON HCL 4 MG/2ML IJ SOLN
4.0000 mg | Freq: Once | INTRAMUSCULAR | Status: AC
  Administered 2013-01-20: 4 mg via INTRAVENOUS
  Filled 2013-01-20: qty 2

## 2013-01-20 MED ORDER — FENTANYL CITRATE 0.05 MG/ML IJ SOLN
INTRAMUSCULAR | Status: AC
  Administered 2013-01-20: 50 ug via INTRAVENOUS
  Filled 2013-01-20: qty 2

## 2013-01-20 NOTE — Progress Notes (Signed)
Returned call to patient. Patient requesting additional pain medication to be called in to [facility] in [place] to use after she runs out of Percocet.  She was given 25 tablets from the ED visit.  Reporting moderate abdominal pain.  Instructed to continue ibuprofen use also along with heating pad if tolerated.  Instructed that Vicodin 5/325 one to two tablets every six hours as needed for pain, #20, 0 refills would be called in but that she was not to take the medication until she runs out of the Percocet as needed.  Verbalizing understanding to not take both medications at the same time.  Reportable signs and symptoms reviewed.

## 2013-01-20 NOTE — ED Notes (Signed)
Increased pain left lower quad with nausea. Pt had CT ABD done @ [place] 7/16 sent in by cancer center . Does not know results of same

## 2013-01-20 NOTE — Telephone Encounter (Signed)
Returned call to patient.  Patient reporting that she had went to ED.  She reports sudden onset of abdominal pain at work.  Reporting moderate abdominal pain with some relief from Percocet.  Instructed to continue ibuprofen use also along with heating pad if tolerated.  Instructed that she would be informed of Dr. Nelwyn Salisbury recommendations next week.  Reportable signs and symptoms reviewed.

## 2013-01-20 NOTE — ED Notes (Signed)
Left in c/o family for transport home - a&ox4; in no apparent distress; instructions, prescriptions and f/u information given/reviewed - verbalizes understanding.

## 2013-01-20 NOTE — ED Provider Notes (Signed)
No acute abdomen. Results of ultrasound discussed with patient. She will get followup through her normal gynecological cancer Dr.    Prescriptions for Percocet and Phenergan given.    Patient understands to follow up at [facility] if pain worsens  Donnetta Hutching, MD 01/20/13 [phone]

## 2013-01-20 NOTE — ED Provider Notes (Signed)
History    CSN: [id number] Arrival date & time 01/20/13  4696  First MD Initiated Contact with Patient 01/20/13 [phone]     Chief Complaint  Patient presents with  . Abdominal Pain   (Consider location/radiation/quality/duration/timing/severity/associated sxs/prior Treatment) HPI Hx per PT - hysto 05-03-12 by DR Clarke-Pearson.  2 days ago develop LLQ ABD pain and had CT scan yesterday.   She has not received the results, expresses anxiety about this and presents with worsening pain, low-grade fever, nausea but no vomiting. No diarrhea. No chest pain or shortness of breath. No dysuria or back pain. No rash. No vaginal discharge. Symptoms moderate to severe.  Past Medical History  Diagnosis Date  . Asthma     recent bronchitis-rescue inhaler  . History of kidney stones   . Cancer   . Cervical cancer    Past Surgical History  Procedure Laterality Date  . Ectopic pregnancy surgery    . Essure tubal ligation  2010  . Leep    . Diagnostic laparoscopy  2007    ectopic  . Cervical conization w/bx  03/24/2012    Procedure: CONIZATION CERVIX WITH BIOPSY;  Surgeon: Adam Phenix, MD;  Location: [facility];  Service: Gynecology;  Laterality: N/A;  . Insertion of suprapubic catheter  05/03/2012    Procedure: INSERTION OF SUPRAPUBIC CATHETER;  Surgeon: Jeannette Corpus, MD;  Location: [facility];  Service: Gynecology;;  . Radical hysterectomy  05/03/2012    Procedure: RADICAL HYSTERECTOMY;  Surgeon: Jeannette Corpus, MD;  Location: [facility];  Service: Gynecology;;  transposition of the ovaries  . Lymphadenectomy  05/03/2012    Procedure: LYMPHADENECTOMY;  Surgeon: Jeannette Corpus, MD;  Location: [facility];  Service: Gynecology;  Laterality: Bilateral;  pelvic   . Abdominal hysterectomy     Family History  Problem Relation Age of Onset  . Diabetes Mother   . Hypertension Father   . Diabetes Sister    History  Substance Use Topics  . Smoking status: Current Every Day Smoker  -- 0.50 packs/day for 19 years    Types: Cigarettes  . Smokeless tobacco: Never Used     Comment: smoking cessation information given  . Alcohol Use: No   OB History   Grav Para Term Preterm Abortions TAB SAB Ect Mult Living   4 4 4       4      Review of Systems  Constitutional: Negative for fever and chills.  HENT: Negative for neck pain and neck stiffness.   Eyes: Negative for pain.  Respiratory: Negative for shortness of breath.   Cardiovascular: Negative for chest pain.  Gastrointestinal: Positive for nausea and abdominal pain.  Genitourinary: Negative for dysuria.  Musculoskeletal: Negative for back pain.  Skin: Negative for rash.  Neurological: Negative for headaches.  All other systems reviewed and are negative.    Allergies  Review of patient's allergies indicates no known allergies.  Home Medications   Current Outpatient Rx  Name  Route  Sig  Dispense  Refill  . ibuprofen (ADVIL,MOTRIN) 200 MG tablet   Oral   Take 800 mg by mouth every 6 (six) hours as needed. For pain         . Multiple Vitamins-Minerals (MULTIVITAMIN WITH MINERALS) tablet   Oral   Take 1 tablet by mouth daily.         Marland Kitchen oxyCODONE-acetaminophen (PERCOCET/ROXICET) 5-325 MG per tablet   Oral   Take 1 tablet by mouth every 4 (four) hours as  needed for pain.   10 tablet   0    BP 116/57  Pulse 125  Temp(Src) 99.6 F (37.6 C)  Resp 16  Ht 5\' 3"  (1.6 m)  Wt 185 lb (83.915 kg)  BMI 32.78 kg/m2  SpO2 100%  LMP 04/11/2012 Physical Exam  Constitutional: She is oriented to person, place, and time. She appears well-developed and well-nourished.  HENT:  Head: Normocephalic and atraumatic.  Eyes: EOM are normal. Pupils are equal, round, and reactive to light. No scleral icterus.  Neck: Neck supple.  Cardiovascular: Regular rhythm and intact distal pulses.   Tachycardias 120s  Pulmonary/Chest: Effort normal and breath sounds normal. No respiratory distress. She exhibits no tenderness.   Abdominal: Soft. Bowel sounds are normal. She exhibits no distension.  Tender left lower quadrant with voluntary guarding. No acute abdomen  Musculoskeletal: Normal range of motion. She exhibits no edema.  Neurological: She is alert and oriented to person, place, and time.  Skin: Skin is warm and dry.    ED Course  Procedures (including critical care time) Labs Reviewed  URINALYSIS, ROUTINE W REFLEX MICROSCOPIC   Ct Abdomen Pelvis W Contrast  01/18/2013   *RADIOLOGY REPORT*  Clinical Data: Intermittent lower abdominal pain.  History of squamous cell carcinoma of the cervix.  CT ABDOMEN AND PELVIS WITH CONTRAST  Technique:  Multidetector CT imaging of the abdomen and pelvis was performed following the standard protocol during bolus administration of intravenous contrast.  Contrast: OMNIPAQUE IOHEXOL 300 MG/ML  SOLN  Comparison: CT of the abdomen and pelvis 05/11/2010.  Findings:  Lung Bases: Unremarkable.  Abdomen/Pelvis:  Multiple gallstones within the gallbladder which appear only partially calcified.  Gallbladder appears nearly completely contracted.  No definite gallbladder wall thickening, pericholecystic fluid or stranding to suggest acute cholecystitis at this time.  The appearance of the liver, pancreas, spleen, bilateral adrenal glands and bilateral kidneys is unremarkable. There is mild hydroureter bilaterally, however, there is no associated dilatation of the collecting system in either kidney. The right-sided hydroureter terminates above the level of the pelvic inlet, where as the left-sided hydroureter continues into the anatomic pelvis and abruptly terminates on image 66 of series 2 at the level of the left adnexa (see below).  No associated perinephric stranding to suggest significant obstruction at this time.  Status post hysterectomy.  Right ovary is unremarkable.  Extending off the posterior aspect of the left ovary there is a large irregular shaped but well defined low  attenuation lesion measuring approximately 6.1 x 5.1 x 4.3 cm.  This appears to exert some mass effect upon the adjacent distal third of the left ureter which may account for the mild asymmetry in the degree of hydroureter noted. No definite mural nodularity associated with this lesion.  There is a trace volume of free fluid the cul-de-sac, presumably physiologic.  No large volume of ascites.  No other definite mesenteric mass identified.  No pneumoperitoneum.  No pathologic distension of small bowel.  No definite pathologic lymphadenopathy identified within the abdomen or pelvis.  Urinary bladder is unremarkable in appearance.  Musculoskeletal: Bilateral pars defects are noted at L5, and there is approximately 16 mm of anterolisthesis of L5 upon S1 which appears similar to the remote prior study from 05/11/2010. There are no aggressive appearing lytic or blastic lesions noted in the visualized portions of the skeleton.  IMPRESSION:  1.  6.1 x 5.1 x 4.3 cm left adnexal cystic mass which appears to arise from the posterior aspect of the  left ovary, as above. Given the patient's symptoms, this may warrant further evaluation with pelvic ultrasound at this time, however, current recommendations suggest that lesions such as this can be evaluated with the yearly pelvic ultrasound. 2.  This cystic pelvic mass exerts some mild mass effect upon the adjacent distal third of the left ureter which accounts for the asymmetric hydroureter noted above.  Given the lack of hydronephrosis or perinephric stranding, this does not appear to constitute significant obstruction at this time. 3.  Status post hysterectomy. 4.  No definite signs to suggest metastatic disease in the abdomen or pelvis from the patient's prior cervical cancer at this time. 5.  Cholelithiasis without evidence to suggest acute cholecystitis at this time. 6.  Severe spondylolisthesis of L5 upon S1 redemonstrated, as above.   Original Report Authenticated By: Trudie Reed, M.D.   IV fluids. IV Dilaudid. IV Zofran.  5:41 AM patient remains uncomfortable and tachycardic. GYN consulted DR Despina Hidden recs pelvic US to further evaluate.  [place] pending - DR Adriana Simas to follow MDM  LLQ ABD pain, LGF and tachycardia  Labs, UA, recent CT reviewed as above  Sunnie Nielsen, MD 01/20/13 [phone]

## 2013-01-25 ENCOUNTER — Other Ambulatory Visit: Payer: Self-pay | Admitting: Gynecologic Oncology

## 2013-01-25 ENCOUNTER — Telehealth: Payer: Self-pay | Admitting: *Deleted

## 2013-01-25 DIAGNOSIS — C539 Malignant neoplasm of cervix uteri, unspecified: Secondary | ICD-10-CM

## 2013-01-25 DIAGNOSIS — N83299 Other ovarian cyst, unspecified side: Secondary | ICD-10-CM

## 2013-01-25 MED ORDER — NORETHINDRONE-ETH ESTRADIOL 1-35 MG-MCG PO TABS: 1.0000 | ORAL_TABLET | Freq: Every day | ORAL | Status: DC

## 2013-01-25 NOTE — Telephone Encounter (Signed)
Called pt to notify of a schedule u/s appointment on sept 5,2014 @ 2:00pm. A message was left with details of appt, Pt was asked to call the office at [phone] to confirm appointment.

## 2013-01-25 NOTE — Progress Notes (Signed)
Called and spoke with patient about Dr. Nelwyn Salisbury recommendations for repeat ultrasound in 6 to 8 weeks and to begin Ortho-Novum 1/35 daily to suppress the ovary and the cyst.  Verbalizing understanding.  Stating that the pain is improving.  Mild nausea reported.  Informed that she would be contacted to schedule an upcoming ultrasound appt.  Instructed to call for any needs.

## 2013-03-10 ENCOUNTER — Ambulatory Visit (HOSPITAL_COMMUNITY): Admission: RE | Admit: 2013-03-10 | Payer: BC Managed Care – PPO | Source: Ambulatory Visit

## 2013-03-31 ENCOUNTER — Encounter: Payer: Self-pay | Admitting: Gynecology

## 2013-03-31 ENCOUNTER — Other Ambulatory Visit (HOSPITAL_COMMUNITY): Admission: RE | Admit: 2013-03-31 | Payer: Medicaid Other | Source: Ambulatory Visit | Admitting: Gynecology

## 2013-03-31 ENCOUNTER — Ambulatory Visit: Payer: BC Managed Care – PPO | Attending: Gynecology | Admitting: Gynecology

## 2013-03-31 VITALS — BP 133/69 | HR 98 | Temp 99.3°F | Resp 16 | Ht 62.0 in | Wt 205.5 lb

## 2013-03-31 DIAGNOSIS — N644 Mastodynia: Secondary | ICD-10-CM

## 2013-03-31 DIAGNOSIS — F17200 Nicotine dependence, unspecified, uncomplicated: Secondary | ICD-10-CM | POA: Insufficient documentation

## 2013-03-31 DIAGNOSIS — C539 Malignant neoplasm of cervix uteri, unspecified: Secondary | ICD-10-CM | POA: Insufficient documentation

## 2013-03-31 DIAGNOSIS — Z90710 Acquired absence of both cervix and uterus: Secondary | ICD-10-CM | POA: Insufficient documentation

## 2013-03-31 DIAGNOSIS — Z87442 Personal history of urinary calculi: Secondary | ICD-10-CM | POA: Insufficient documentation

## 2013-03-31 DIAGNOSIS — J45909 Unspecified asthma, uncomplicated: Secondary | ICD-10-CM | POA: Insufficient documentation

## 2013-03-31 DIAGNOSIS — Z01419 Encounter for gynecological examination (general) (routine) without abnormal findings: Secondary | ICD-10-CM | POA: Insufficient documentation

## 2013-03-31 DIAGNOSIS — N6452 Nipple discharge: Secondary | ICD-10-CM

## 2013-03-31 NOTE — Patient Instructions (Addendum)
Return to see [place] in 6 months or sooner if you develop a new onset of pain. Reschedule mammograms in the near future.  Please call in Dec. 2014 or Jan 2015 to schedule an appt for March 2015.

## 2013-03-31 NOTE — Progress Notes (Signed)
Consult Note: Gyn-Onc   April Ayala 37 y.o. female  Chief Complaint  Patient presents with  . 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 No evidence disease. Recent history of the painful ovarian cyst which is now resolved.  Plan: Pap smears are obtained. We discussed the ultrasound and CT findings and the possibility of recurrence of the ovarian cyst. She will schedule mammograms in the near future to return to see [place] in 6 months.  Interval History: Patient returns today as previously scheduled. Following our last visit the patient had a CT scan and an ultrasound showing a left ovarian cyst. Apparently at that time she was having severe pain. This is now resolved.. She denies any GI or GU symptoms has no vaginal or pelvic pain.  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 Final pathology showed negative lymph nodes negative margins and cervical stroma invasion of less than 50%. No postoperative therapy was advised. Review of Systems:10 point review of systems is negative except as noted in interval history.   Vitals: Blood pressure 133/69, pulse 98, temperature 99.3 F (37.4 C), temperature source Oral, resp. rate 16, height 5\' 2"  (1.575 m), weight 205 lb 8 oz (93.214 kg), last menstrual period 04/11/2012.  Physical Exam: General : The patient is a healthy woman in no acute distress.  HEENT: normocephalic, extraoccular movements normal; neck is supple without thyromegally  Lynphnodes: Supraclavicular and inguinal nodes not enlarged  Abdomen: Soft, non-tender, no ascites, no organomegally, no masses, no hernias Pfannenstiel incision is well-healed Pelvic:  EGBUS: Normal female  Vagina: Normal, no lesions  Urethra and Bladder: Normal, non-tender  Cervix: Surgically absent  Uterus: Surgically absent  Bi-manual examination: Non-tender; no  adenxal masses or nodularity  Rectal: normal sphincter tone, no masses, no blood  Lower extremities: No edema or varicosities. Normal range of motion      No Known Allergies  Past Medical History  Diagnosis Date  . Asthma     recent bronchitis-rescue inhaler  . History of kidney stones   . Cancer   . Cervical cancer     Past Surgical History  Procedure Laterality Date  . Ectopic pregnancy surgery    . Essure tubal ligation  2010  . Leep    . Diagnostic laparoscopy  2007    ectopic  . Cervical conization w/bx  03/24/2012    Procedure: CONIZATION CERVIX WITH BIOPSY;  Surgeon: Adam Phenix, MD;  Location: [facility];  Service: Gynecology;  Laterality: N/A;  . Insertion of suprapubic catheter  05/03/2012    Procedure: INSERTION OF SUPRAPUBIC CATHETER;  Surgeon: Jeannette Corpus, MD;  Location: [facility];  Service: Gynecology;;  . Radical hysterectomy  05/03/2012    Procedure: RADICAL HYSTERECTOMY;  Surgeon: Jeannette Corpus, MD;  Location: [facility];  Service: Gynecology;;  transposition of the ovaries  . Lymphadenectomy  05/03/2012    Procedure: LYMPHADENECTOMY;  Surgeon: Jeannette Corpus, MD;  Location: [facility];  Service: Gynecology;  Laterality: Bilateral;  pelvic   . Abdominal hysterectomy      Current Outpatient Prescriptions  Medication Sig Dispense Refill  . HYDROcodone-acetaminophen (NORCO/VICODIN) 5-325 MG per tablet Take 1 tablet by mouth every 6 (six) hours as needed for pain.      Marland Kitchen ibuprofen (ADVIL,MOTRIN) 200 MG tablet Take 800 mg by mouth every 6 (six) hours as needed for  pain.       . Multiple Vitamins-Minerals (MULTIVITAMIN WITH MINERALS) tablet Take 1 tablet by mouth daily.      . norethindrone-ethinyl estradiol 1/35 (ORTHO-NOVUM, NORTREL,CYCLAFEM) tablet Take 1 tablet by mouth daily.  1 Package  11  . promethazine (PHENERGAN) 25 MG tablet Take 1 tablet (25 mg total) by mouth every 6 (six) hours as needed for nausea.  20 tablet  0  .  oxyCODONE-acetaminophen (PERCOCET) 5-325 MG per tablet Take 2 tablets by mouth every 4 (four) hours as needed for pain.  25 tablet  0   No current facility-administered medications for this visit.    History   Social History  . Marital Status: Married    Spouse Name: N/A    Number of Children: N/A  . Years of Education: N/A   Occupational History  . Not on file.   Social History Main Topics  . Smoking status: Current Every Day Smoker -- 0.50 packs/day for 19 years    Types: Cigarettes  . Smokeless tobacco: Never Used     Comment: smoking cessation information given  . Alcohol Use: No  . Drug Use: No  . Sexual Activity: Yes    Birth Control/ Protection: Surgical   Other Topics Concern  . Not on file   Social History Narrative  . No narrative on file    Family History  Problem Relation Age of Onset  . Diabetes Mother   . Hypertension Father   . Diabetes Sister       Jeannette Corpus, MD 03/31/2013, 9:48 AM

## 2013-04-05 ENCOUNTER — Telehealth: Payer: Self-pay | Admitting: Gynecologic Oncology

## 2013-04-05 NOTE — Telephone Encounter (Signed)
Message left for patient with pap smear results: negative.  Instructed to call for any questions or concerns.  

## 2013-05-13 ENCOUNTER — Emergency Department (HOSPITAL_COMMUNITY): Payer: BC Managed Care – PPO

## 2013-05-13 ENCOUNTER — Emergency Department (HOSPITAL_COMMUNITY)
Admission: EM | Admit: 2013-05-13 | Discharge: 2013-05-13 | Disposition: A | Discharge status: Home / Self Care | Payer: BC Managed Care – PPO | Attending: Emergency Medicine | Admitting: Emergency Medicine

## 2013-05-13 ENCOUNTER — Encounter (HOSPITAL_COMMUNITY): Payer: Self-pay | Admitting: Emergency Medicine

## 2013-05-13 DIAGNOSIS — Z8541 Personal history of malignant neoplasm of cervix uteri: Secondary | ICD-10-CM | POA: Insufficient documentation

## 2013-05-13 DIAGNOSIS — Z79899 Other long term (current) drug therapy: Secondary | ICD-10-CM | POA: Insufficient documentation

## 2013-05-13 DIAGNOSIS — J45909 Unspecified asthma, uncomplicated: Secondary | ICD-10-CM | POA: Insufficient documentation

## 2013-05-13 DIAGNOSIS — R071 Chest pain on breathing: Secondary | ICD-10-CM | POA: Insufficient documentation

## 2013-05-13 DIAGNOSIS — F17200 Nicotine dependence, unspecified, uncomplicated: Secondary | ICD-10-CM | POA: Insufficient documentation

## 2013-05-13 DIAGNOSIS — R0789 Other chest pain: Secondary | ICD-10-CM

## 2013-05-13 DIAGNOSIS — Z87442 Personal history of urinary calculi: Secondary | ICD-10-CM | POA: Insufficient documentation

## 2013-05-13 LAB — BASIC METABOLIC PANEL
CO2: 25 mEq/L (ref 19–32)
GFR calc Af Amer: >90 mL/min (ref >90)
Glucose, Bld: 88 mg/dL (ref 70–99)
Potassium: 3.3 mEq/L — ABNORMAL LOW (ref 3.5–5.1)
Sodium: 136 mEq/L (ref 135–145)

## 2013-05-13 LAB — URINALYSIS, ROUTINE W REFLEX MICROSCOPIC
Glucose, UA: NEGATIVE mg/dL
Hgb urine dipstick: NEGATIVE
Protein, ur: NEGATIVE mg/dL
Specific Gravity, Urine: >1.03 — ABNORMAL HIGH (ref 1.005–1.030)
pH: 6 (ref 5.0–8.0)

## 2013-05-13 LAB — CBC WITH DIFFERENTIAL/PLATELET
Basophils Absolute: 0 10*3/uL (ref 0.0–0.1)
Basophils Relative: 1 % (ref 0–1)
Eosinophils Absolute: 0.2 10*3/uL (ref 0.0–0.7)
Eosinophils Relative: 3 % (ref 0–5)
Hemoglobin: 13.8 g/dL (ref 12.0–15.0)
Lymphs Abs: 2.3 10*3/uL (ref 0.7–4.0)
MCH: 29.7 pg (ref 26.0–34.0)
MCV: 87.3 fL (ref 78.0–100.0)
Monocytes Relative: 11 % (ref 3–12)
Neutro Abs: 3.5 10*3/uL (ref 1.7–7.7)
RBC: 4.64 MIL/uL (ref 3.87–5.11)
RDW: 13.3 % (ref 11.5–15.5)
WBC: 6.9 10*3/uL (ref 4.0–10.5)

## 2013-05-13 MED ORDER — METHOCARBAMOL 500 MG PO TABS: 1000.0000 mg | ORAL_TABLET | Freq: Four times a day (QID) | ORAL | Status: DC | PRN

## 2013-05-13 MED ORDER — HYDROCODONE-ACETAMINOPHEN 5-325 MG PO TABS: ORAL_TABLET | ORAL | Status: DC

## 2013-05-13 MED ORDER — OXYCODONE-ACETAMINOPHEN 5-325 MG PO TABS
1.0000 | ORAL_TABLET | Freq: Once | ORAL | Status: AC
  Administered 2013-05-13: 1 via ORAL
  Filled 2013-05-13: qty 1

## 2013-05-13 MED ORDER — NAPROXEN 250 MG PO TABS: 250.0000 mg | ORAL_TABLET | Freq: Two times a day (BID) | ORAL | Status: DC

## 2013-05-13 NOTE — ED Notes (Signed)
Additional note from triage:  Pt states pain is worse to chest when she attempts to straighten out her arm. States numbness to left arm at times also.

## 2013-05-13 NOTE — ED Notes (Signed)
Pt states sharp, constant pain to left chest radiating down left arm. Pain began yesterday.

## 2013-05-13 NOTE — ED Notes (Signed)
Pt unable to obtain urine sample at present time,  

## 2013-05-13 NOTE — ED Notes (Signed)
Urine sample obtained, sent to lab, update given to pt on delay in care.

## 2013-05-13 NOTE — ED Provider Notes (Signed)
CSN: [id number]     Arrival date & time 05/13/13  1314 History   First MD Initiated Contact with Patient 05/13/13 1445     Chief Complaint  Patient presents with  . Chest Pain    HPI Pt was seen at 1510. Per pt, c/o gradual onset and persistence of constant left sided chest wall "pain" for the past 2 days.  Pt describes the pain as "sharp," worsens with movement of her left arm and palpation of the area. Denies injury, no SOB/cough, no palpitations, no abd pain, no N/V/D, no back pain, no fevers, no rash.    Past Medical History  Diagnosis Date  . Asthma     recent bronchitis-rescue inhaler  . History of kidney stones   . 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 Ectopic pregnancy surgery    . Essure tubal ligation  2010  . Leep    . Diagnostic laparoscopy  2007    ectopic  . Cervical conization w/bx  03/24/2012    Procedure: CONIZATION CERVIX WITH BIOPSY;  Surgeon: Adam Phenix, MD;  Location: [facility];  Service: Gynecology;  Laterality: N/A;  . Insertion of suprapubic catheter  05/03/2012    Procedure: INSERTION OF SUPRAPUBIC CATHETER;  Surgeon: Jeannette Corpus, MD;  Location: [facility];  Service: Gynecology;;  . Radical hysterectomy  05/03/2012    Procedure: RADICAL HYSTERECTOMY;  Surgeon: Jeannette Corpus, MD;  Location: [facility];  Service: Gynecology;;  transposition of the ovaries  . Lymphadenectomy  05/03/2012    Procedure: LYMPHADENECTOMY;  Surgeon: Jeannette Corpus, MD;  Location: [facility];  Service: Gynecology;  Laterality: Bilateral;  pelvic   . Abdominal hysterectomy     Family History  Problem Relation Age of Onset  . Diabetes Mother   . Hypertension Father   . Diabetes Sister    History  Substance Use Topics  . Smoking status: Current Every Day Smoker -- 0.50 packs/day for 19 years    Types: Cigarettes  . Smokeless tobacco: Never Used     Comment: smoking cessation information  given  . Alcohol Use: No   OB History   Grav Para Term Preterm Abortions TAB SAB Ect Mult Living   4 4 4       4      Review of Systems ROS: Statement: All systems negative except as marked or noted in the HPI; Constitutional: Negative for fever and chills. ; ; Eyes: Negative for eye pain, redness and discharge. ; ; ENMT: Negative for ear pain, hoarseness, nasal congestion, sinus pressure and sore throat. ; ; Cardiovascular: Negative for palpitations, diaphoresis, dyspnea and peripheral edema. ; ; Respiratory: Negative for cough, wheezing and stridor. ; ; Gastrointestinal: Negative for nausea, vomiting, diarrhea, abdominal pain, blood in stool, hematemesis, jaundice and rectal bleeding. . ; ; Genitourinary: Negative for dysuria, flank pain and hematuria. ; ; Musculoskeletal: +chest wall pain. Negative for back pain and neck pain. Negative for swelling and trauma.; ; Skin: Negative for pruritus, rash, abrasions, blisters, bruising and skin lesion.; ; Neuro: Negative for headache, lightheadedness and neck stiffness. Negative for weakness, altered level of consciousness , altered mental status, extremity weakness, paresthesias, involuntary movement, seizure and syncope.       Allergies  Review of patient's allergies indicates no known allergies.  Home Medications   Current Outpatient Rx  Name  Route  Sig  Dispense  Refill  . ibuprofen (ADVIL,MOTRIN)  200 MG tablet   Oral   Take 800 mg by mouth every 6 (six) hours as needed for pain.          . Multiple Vitamins-Minerals (MULTIVITAMIN WITH MINERALS) tablet   Oral   Take 1 tablet by mouth daily.         . norethindrone-ethinyl estradiol 1/35 (ORTHO-NOVUM, NORTREL,CYCLAFEM) tablet   Oral   Take 1 tablet by mouth daily.   1 Package   11   . HYDROcodone-acetaminophen (NORCO/VICODIN) 5-325 MG per tablet      1 or 2 tabs PO q6 hours prn pain   20 tablet   0   . methocarbamol (ROBAXIN) 500 MG tablet   Oral   Take 2 tablets (1,000 mg  total) by mouth 4 (four) times daily as needed for muscle spasms (muscle spasm/pain).   25 tablet   0   . naproxen (NAPROSYN) 250 MG tablet   Oral   Take 1 tablet (250 mg total) by mouth 2 (two) times daily with a meal.   14 tablet   0    BP 134/78  Pulse 106  Temp(Src) 98.2 F (36.8 C) (Oral)  Resp 16  Ht 5\' 3"  (1.6 m)  Wt 185 lb (83.915 kg)  BMI 32.78 kg/m2  SpO2 100%  LMP 04/11/2012 Physical Exam 1515: Physical examination:  Nursing notes reviewed; Vital signs and O2 SAT reviewed;  Constitutional: Well developed, Well nourished, Well hydrated, In no acute distress; Head:  Normocephalic, atraumatic; Eyes: EOMI, PERRL, No scleral icterus; ENMT: Mouth and pharynx normal, Mucous membranes moist; Neck: Supple, Full range of motion, No lymphadenopathy; Cardiovascular: Regular rate and rhythm, No murmur, rub, or gallop; Respiratory: Breath sounds clear & equal bilaterally, No rales, rhonchi, wheezes.  Speaking full sentences with ease, Normal respiratory effort/excursion; Chest: +TTP left mid and upper anterior chest wall areas which reproduces pt's pain. No deformity, no rash, no soft tissue crepitus. Movement normal; Abdomen: Soft, Nontender, Nondistended, Normal bowel sounds; Genitourinary: No CVA tenderness; Extremities: Pulses normal, No tenderness, No edema, No calf edema or asymmetry.; Neuro: AA&Ox3, Major CN grossly intact.  Speech clear. No gross focal motor or sensory deficits in extremities.; Skin: Color normal, Warm, Dry.   ED Course  Procedures   EKG Interpretation     Ventricular Rate:  89 PR Interval:  130 QRS Duration: 90 QT Interval:  374 QTC Calculation: 455 R Axis:   69 Text Interpretation:  Normal sinus rhythm Normal ECG When compared with ECG of 25-Mar-2011 04:19, No significant change was found            MDM  MDM Reviewed: previous chart, nursing note and vitals Reviewed previous: labs and ECG Interpretation: labs, x-ray and ECG   Results for  orders placed during the hospital encounter of 05/13/13  TROPONIN I      Result Value Range   Troponin I <0.30  <0.30 ng/mL  D-DIMER, QUANTITATIVE      Result Value Range   D-Dimer, Quant 0.35  0.00 - 0.48 ug/mL-FEU  URINALYSIS, ROUTINE W REFLEX MICROSCOPIC      Result Value Range   Color, Urine YELLOW  YELLOW   APPearance CLEAR  CLEAR   Specific Gravity, Urine >1.030 (*) 1.005 - 1.030   pH 6.0  5.0 - 8.0   Glucose, UA NEGATIVE  NEGATIVE mg/dL   Hgb urine dipstick NEGATIVE  NEGATIVE   Bilirubin Urine NEGATIVE  NEGATIVE   Ketones, ur NEGATIVE  NEGATIVE mg/dL   Protein, ur  NEGATIVE  NEGATIVE mg/dL   Urobilinogen, UA 0.2  0.0 - 1.0 mg/dL   Nitrite NEGATIVE  NEGATIVE   Leukocytes, UA NEGATIVE  NEGATIVE  BASIC METABOLIC PANEL      Result Value Range   Sodium 136  135 - 145 mEq/L   Potassium 3.3 (*) 3.5 - 5.1 mEq/L   Chloride 101  96 - 112 mEq/L   CO2 25  19 - 32 mEq/L   Glucose, Bld 88  70 - 99 mg/dL   BUN 10  6 - 23 mg/dL   Creatinine, Ser 4.09  0.50 - 1.10 mg/dL   Calcium 9.2  8.4 - 81.1 mg/dL   GFR calc non Af Amer >90  >90 mL/min   GFR calc Af Amer >90  >90 mL/min  CBC WITH DIFFERENTIAL      Result Value Range   WBC 6.9  4.0 - 10.5 K/uL   RBC 4.64  3.87 - 5.11 MIL/uL   Hemoglobin 13.8  12.0 - 15.0 g/dL   HCT 91.4  78.2 - 95.6 %   MCV 87.3  78.0 - 100.0 fL   MCH 29.7  26.0 - 34.0 pg   MCHC 34.1  30.0 - 36.0 g/dL   RDW 21.3  08.6 - 57.8 %   Platelets 312  150 - 400 K/uL   Neutrophils Relative % 52  43 - 77 %   Neutro Abs 3.5  1.7 - 7.7 K/uL   Lymphocytes Relative 33  12 - 46 %   Lymphs Abs 2.3  0.7 - 4.0 K/uL   Monocytes Relative 11  3 - 12 %   Monocytes Absolute 0.8  0.1 - 1.0 K/uL   Eosinophils Relative 3  0 - 5 %   Eosinophils Absolute 0.2  0.0 - 0.7 K/uL   Basophils Relative 1  0 - 1 %   Basophils Absolute 0.0  0.0 - 0.1 K/uL   Dg Chest 2 View 05/13/2013   CLINICAL DATA:  Left chest/arm pain  EXAM: CHEST  2 VIEW  COMPARISON:  01/20/2013.  FINDINGS: Lungs are  clear. No pleural effusion or pneumothorax.  The heart is normal in size.  Visualized osseous structures are within normal limits.  IMPRESSION: Normal chest radiographs.   Electronically Signed   By: Charline Bills M.D.   On: 05/13/2013 13:55      1800:  Pt talking on phone and watching TV. NAD, resps easy. Feels better after meds and wants to go home now. Doubt PE as cause for symptoms with normal d-dimer and low risk [place].  Doubt ACS as cause for symptoms with normal troponin and unchanged EKG from previous after 2 days of constant symptoms. Will tx symptomatically at this time. Pt requesting work note for the past 2 days. Dx and testing d/w pt.  Questions answered.  Verb understanding, agreeable to d/c home with outpt f/u.    Laray Anger, DO 05/16/13 [phone]

## 2013-05-13 NOTE — ED Notes (Signed)
Dr McManus in prior to RN, see edp assessment for further,  

## 2013-09-12 ENCOUNTER — Encounter (HOSPITAL_COMMUNITY): Payer: Self-pay | Admitting: Emergency Medicine

## 2013-09-12 ENCOUNTER — Emergency Department (HOSPITAL_COMMUNITY)
Admission: EM | Admit: 2013-09-12 | Discharge: 2013-09-12 | Disposition: A | Discharge status: Home / Self Care | Payer: BC Managed Care – PPO | Source: Home / Self Care | Attending: Emergency Medicine | Admitting: Emergency Medicine

## 2013-09-12 DIAGNOSIS — H6690 Otitis media, unspecified, unspecified ear: Secondary | ICD-10-CM

## 2013-09-12 DIAGNOSIS — H6692 Otitis media, unspecified, left ear: Secondary | ICD-10-CM

## 2013-09-12 DIAGNOSIS — J069 Acute upper respiratory infection, unspecified: Secondary | ICD-10-CM

## 2013-09-12 MED ORDER — AMOXICILLIN-POT CLAVULANATE 875-125 MG PO TABS: 1.0000 | ORAL_TABLET | Freq: Two times a day (BID) | ORAL | Status: DC

## 2013-09-12 MED ORDER — GUAIFENESIN-CODEINE 100-10 MG/5ML PO SYRP: 10.0000 mL | ORAL_SOLUTION | Freq: Four times a day (QID) | ORAL | Status: DC | PRN

## 2013-09-12 MED ORDER — IPRATROPIUM BROMIDE 0.06 % NA SOLN: 2.0000 | Freq: Four times a day (QID) | NASAL | Status: DC

## 2013-09-12 NOTE — ED Provider Notes (Signed)
  Chief Complaint   Chief Complaint  Patient presents with  . URI    History of Present Illness   April Ayala is a 38 year old female who has had a two-day history of sneezing, nasal congestion with yellow-green drainage, headache, right ear pain, cough productive yellow-green sputum, myalgias, fatigue, dizziness, chills, and fever of up to 101. She has also had sore throat and felt nauseated. Her daughter has had a similar syndrome. She denies any GI symptoms.  Review of Systems   Other than as noted above, the patient denies any of the following symptoms: Systemic:  No fevers, chills, sweats, or myalgias. Eye:  No redness or discharge. ENT:  No ear pain, headache, nasal congestion, drainage, sinus pressure, or sore throat. Neck:  No neck pain, stiffness, or swollen glands. Lungs:  No cough, sputum production, hemoptysis, wheezing, chest tightness, shortness of breath or chest pain. GI:  No abdominal pain, nausea, vomiting or diarrhea.  [place]   Past medical history, family history, social history, meds, and allergies were reviewed. She is a smoker.  Physical exam   Vital signs:  BP 118/72  Pulse 100  Temp(Src) 98.4 F (36.9 C) (Oral)  Resp 16  SpO2 100%  LMP 04/11/2012 General:  Alert and oriented.  In no distress.  Skin warm and dry. Eye:  No conjunctival injection or drainage. Lids were normal. ENT:  Her left TM was slightly red, right was normal.  Nasal mucosa was clear and uncongested, without drainage.  Mucous membranes were moist.  Pharynx was clear with no exudate or drainage.  There were no oral ulcerations or lesions. Neck:  Supple, no adenopathy, tenderness or mass. Lungs:  No respiratory distress.  Lungs were clear to auscultation, without wheezes, rales or rhonchi.  Breath sounds were clear and equal bilaterally.  Heart:  Regular rhythm, without gallops, murmers or rubs. Skin:  Clear, warm, and dry, without rash or lesions.  Assessment     The primary  encounter diagnosis was Viral upper respiratory infection. A diagnosis of Left otitis media was also pertinent to this visit.  Plan    1.  Meds:  The following meds were prescribed:   Discharge Medication List as of 09/12/2013 10:34 AM    START taking these medications   Details  amoxicillin-clavulanate (AUGMENTIN) 875-125 MG per tablet Take 1 tablet by mouth 2 (two) times daily., Starting 09/12/2013, Until Discontinued, Normal    guaiFENesin-codeine (GUIATUSS AC) 100-10 MG/5ML syrup Take 10 mLs by mouth 4 (four) times daily as needed for cough., Starting 09/12/2013, Until Discontinued, Print    ipratropium (ATROVENT) 0.06 % nasal spray Place 2 sprays into both nostrils 4 (four) times daily., Starting 09/12/2013, Until Discontinued, Normal        2.  Patient Education/Counseling:  The patient was given appropriate handouts, self care instructions, and instructed in symptomatic relief.  Instructed to get extra fluids, rest, and use a cool mist vaporizer.    3.  Follow up:  The patient was told to follow up here if no better in 3 to 4 days, or sooner if becoming worse in any way, and given some red flag symptoms such as increasing fever, difficulty breathing, chest pain, or persistent vomiting which would prompt immediate return.  Follow up here as needed.      Harden Mo, MD 09/12/13 [phone]

## 2013-09-12 NOTE — ED Notes (Signed)
Pt triaged and assessed by provider.   Provider in before nurse. 

## 2013-09-12 NOTE — Discharge Instructions (Signed)
Otitis Media, Adult Otitis media is redness, soreness, and swelling (inflammation) of the middle ear. Otitis media may be caused by allergies or, most commonly, by infection. Often it occurs as a complication of the common cold. SIGNS AND SYMPTOMS Symptoms of otitis media may include:  Earache.  Fever.  Ringing in your ear.  Headache.  Leakage of fluid from the ear. DIAGNOSIS To diagnose otitis media, your health care provider will examine your ear with an otoscope. This is an instrument that allows your health care provider to see into your ear in order to examine your eardrum. Your health care provider also will ask you questions about your symptoms. TREATMENT  Typically, otitis media resolves on its own within 3 5 days. Your health care provider may prescribe medicine to ease your symptoms of pain. If otitis media does not resolve within 5 days or is recurrent, your health care provider may prescribe antibiotic medicines if he or she suspects that a bacterial infection is the cause. HOME CARE INSTRUCTIONS   Take your medicine as directed until it is gone, even if you feel better after the first few days.  Only take over-the-counter or prescription medicines for pain, discomfort, or fever as directed by your health care provider.  Follow up with your health care provider as directed. SEEK MEDICAL CARE IF:  You have otitis media only in one ear or bleeding from your nose or both.  You notice a lump on your neck.  You are not getting better in 3 5 days.  You feel worse instead of better. SEEK IMMEDIATE MEDICAL CARE IF:   You have pain that is not controlled with medicine.  You have swelling, redness, or pain around your ear or stiffness in your neck.  You notice that part of your face is paralyzed.  You notice that the bone behind your ear (mastoid) is tender when you touch it. MAKE SURE YOU:   Understand these instructions.  Will watch your condition.  Will get help  right away if you are not doing well or get worse. Document Released: 03/27/2004 Document Revised: 04/12/2013 Document Reviewed: 01/17/2013 [facility] Patient Information 2014 [place], [place].   Most upper respiratory infections are caused by viruses and do not require antibiotics.  We try to save the antibiotics for when we really need them to prevent bacteria from developing resistance to them.  Here are a few hints about things that can be done at home to help get over an upper respiratory infection quicker:  Get extra sleep and extra fluids.  Get 7 to 9 hours of sleep per night and 6 to 8 glasses of water a day.  Getting extra sleep keeps the immune system from getting run down.  Most people with an upper respiratory infection are a little dehydrated.  The extra fluids also keep the secretions liquified and easier to deal with.  Also, get extra vitamin C.  4000 mg per day is the recommended dose. For the aches, headache, and fever, acetaminophen or ibuprofen are helpful.  These can be alternated every 4 hours.  People with liver disease should avoid large amounts of acetaminophen, and people with ulcer disease, gastroesophageal reflux, gastritis, congestive heart failure, chronic kidney disease, coronary artery disease and the elderly should avoid ibuprofen. For nasal congestion try Mucinex-D, or if you're having lots of sneezing or clear nasal drainage use Zyrtec-D. People with high blood pressure can take these if their blood pressure is controlled, if not, it's best to avoid the  forms with a "D" (decongestants).  You can use the plain Mucinex, Allegra, Claritin, or Zyrtec even if your blood pressure is not controlled.   A Saline nasal spray such as Ocean Spray can also help.  You can add a decongestant sprays such as Afrin, but you should not use the decongestant sprays for more than 3 or 4 days since they can be habituating.  Breathe Rite nasal strips can also offer a non-drug alternative treatment  to nasal congestion, especially at night. For people with symptoms of sinusitis, sleeping with your head elevated can be helpful.  For sinus pain, moist, hot compresses to the face may provide some relief.  Many people find that inhaling steam as in a shower or from a pot of steaming water can help. For any viral infection, zinc containing lozenges such as Cold-Eze or Zicam are helpful.  Zinc helps to fight viral infection.  Hot salt water gargles (8 oz of hot water, 1/2 tsp of table salt, and a pinch of baking soda) can give relief as well as hot beverages such as hot tea.  Sucrets extra strength lozenges will help the sore throat.  For the cough, take Delsym 2 tsp every 12 hours.  It has also been found recently that Aleve can help control a cough.  The dose is 1 to 2 tablets twice daily with food.  This can be combined with Delsym. (Note, if you are taking ibuprofen, you should not take Aleve as well--take one or the other.) A cool mist vaporizer will help keep your mucous membranes from drying out.   It's important when you have an upper respiratory infection not to pass the infection to others.  This involves being very careful about the following:  Frequent hand washing or use of hand sanitizer, especially after coughing, sneezing, blowing your nose or touching your face, nose or eyes. Do not shake hands or touch anyone and try to avoid touching surfaces that other people use such as doorknobs, shopping carts, telephones and computer keyboards. Use tissues and dispose of them properly in a garbage can or ziplock bag. Cough into your sleeve. Do not let others eat or drink after you.  It's also important to recognize the signs of serious illness and get evaluated if they occur: Any respiratory infection that lasts more than 7 to 10 days.  Yellow nasal drainage and sputum are not reliable indicators of a bacterial infection, but if they last for more than 1 week, see your doctor. Fever and sore  throat can indicate strep. Fever and cough can indicate influenza or pneumonia. Any kind of severe symptom such as difficulty breathing, intractable vomiting, or severe pain should prompt you to see a doctor as soon as possible.   Your body's immune system is really the thing that will get rid of this infection.  Your immune system is comprised of 2 types of specialized cells called T cells and B cells.  T cells coordinate the array of cells in your body that engulf invading bacteria or viruses while B cells orchestrate the production of antibodies that neutralize infection.  Anything we do or any medications we give you, will just strengthen your immune system or help it clear up the infection quicker.  Here are a few helpful hints to improve your immune system to help overcome this illness or to prevent future infections:  A few vitamins can improve the health of your immune system.  That's why your diet should include plenty of  fruits, vegetables, fish, nuts, and whole grains.  Vitamin A and bet-carotene can increase the cells that fight infections (T cells and B cells).  Vitamin A is abundant in dark greens and orange vegetables such as spinach, greens, sweet potatoes, and carrots.  Vitamin B6 contributes to the maturation of white blood cells, the cells that fight disease.  Foods with vitamin B6 include cold cereal and bananas.  Vitamin C is credited with preventing colds because it increases white blood cells and also prevents cellular damage.  Citrus fruits, peaches and green and red bell peppers are all hight in vitamin C.  Vitamin E is an anti-oxidant that encourages the production of natural killer cells which reject foreign invaders and B cells that produce antibodies.  Foods high in vitamin E include wheat germ, nuts and seeds.  Foods high in omega-3 fatty acids found in foods like salmon, tuna and mackerel boost your immune system and help cells to engulf and absorb germs.  Probiotics  are good bacteria that increase your T cells.  These can be found in yogurt and are available in supplements such as Culturelle or Align.  Moderate exercise increases the strength of your immune system and your ability to recover from illness.  I suggest 3 to 5 moderate intensity 30 minute workouts per week.    Sleep is another component of maintaining a strong immune system.  It enables your body to recuperate from the day's activities, stress and work.  My recommendation is to get between 7 and 9 hours of sleep per night.  If you smoke, try to quit completely or at least cut down.  Drink alcohol only in moderation if at all.  No more than 2 drinks daily for men or 1 for women.  Get a flu vaccine early in the fall or if you have not gotten one yet, once this illness has run its course.  If you are over 65, a smoker, or an asthmatic, get a pneumococcal vaccine.  My final recommendation is to maintain a healthy weight.  Excess weight can impair the immune system by interfering with the way the immune system deals with invading viruses or bacteria.

## 2013-10-06 ENCOUNTER — Ambulatory Visit: Payer: BC Managed Care – PPO | Attending: Gynecology | Admitting: Gynecology

## 2013-10-06 ENCOUNTER — Other Ambulatory Visit: Payer: Self-pay | Admitting: Gynecologic Oncology

## 2013-10-06 ENCOUNTER — Encounter: Payer: Self-pay | Admitting: Gynecology

## 2013-10-06 ENCOUNTER — Other Ambulatory Visit (HOSPITAL_COMMUNITY)
Admission: RE | Admit: 2013-10-06 | Discharge: 2013-10-06 | Disposition: A | Discharge status: Home / Self Care | Payer: BC Managed Care – PPO | Source: Ambulatory Visit | Attending: Gynecology | Admitting: Gynecology

## 2013-10-06 VITALS — BP 124/51 | HR 97 | Temp 98.7°F | Resp 20 | Ht 62.0 in | Wt 213.7 lb

## 2013-10-06 DIAGNOSIS — Z90710 Acquired absence of both cervix and uterus: Secondary | ICD-10-CM | POA: Insufficient documentation

## 2013-10-06 DIAGNOSIS — Z79899 Other long term (current) drug therapy: Secondary | ICD-10-CM | POA: Insufficient documentation

## 2013-10-06 DIAGNOSIS — Z01419 Encounter for gynecological examination (general) (routine) without abnormal findings: Secondary | ICD-10-CM | POA: Insufficient documentation

## 2013-10-06 DIAGNOSIS — Z791 Long term (current) use of non-steroidal anti-inflammatories (NSAID): Secondary | ICD-10-CM | POA: Insufficient documentation

## 2013-10-06 DIAGNOSIS — F17200 Nicotine dependence, unspecified, uncomplicated: Secondary | ICD-10-CM | POA: Insufficient documentation

## 2013-10-06 DIAGNOSIS — N6452 Nipple discharge: Secondary | ICD-10-CM

## 2013-10-06 DIAGNOSIS — N83209 Unspecified ovarian cyst, unspecified side: Secondary | ICD-10-CM | POA: Insufficient documentation

## 2013-10-06 DIAGNOSIS — N644 Mastodynia: Secondary | ICD-10-CM | POA: Insufficient documentation

## 2013-10-06 DIAGNOSIS — C539 Malignant neoplasm of cervix uteri, unspecified: Secondary | ICD-10-CM

## 2013-10-06 DIAGNOSIS — M79609 Pain in unspecified limb: Secondary | ICD-10-CM | POA: Insufficient documentation

## 2013-10-06 DIAGNOSIS — J45909 Unspecified asthma, uncomplicated: Secondary | ICD-10-CM | POA: Insufficient documentation

## 2013-10-06 NOTE — Addendum Note (Signed)
Addended by: Joylene John D on: 10/06/2013 03:46 PM   Modules accepted: Orders

## 2013-10-06 NOTE — Progress Notes (Signed)
Consult Note: Gyn-Onc   [name] 38 y.o. female  Chief Complaint  Patient presents with  . 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 No evidence disease. The left breast and axillary pain. (Indeterminate etiology)  Plan: Pap smears are obtained.Marland Kitchen She will schedule mammograms in the near future. Return to see [place] in 6 months. It is suggested the patient was dropped from using caffeine to see if this will help with her breast pain.  Interval History: Patient returns today as previously scheduled.  Since her last visit she's done well. She tells me that she has had no further pain from the ovarian cyst. She denies any GI or GU symptoms has no pelvic pain pressure vaginal bleeding or discharge.  Patient does have a new symptom of left lateral breast and axillary pain. She has not noticed any skin changes or masses. She does not have a family history of breast cancer although she does have a family history of fibrocystic breast disease.  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 Final pathology showed negative lymph nodes negative margins and cervical stroma invasion of less than 50%. No postoperative therapy was advised. Review of Systems:10 point review of systems is negative except as noted in interval history.   Vitals: Blood pressure 124/51, pulse 97, temperature 98.7 F (37.1 C), temperature source Oral, resp. rate 20, height 5\' 2"  (1.575 m), weight 213 lb 11.2 oz (96.934 kg), last menstrual period 04/11/2012.  Physical Exam: General : The patient is a healthy woman in no acute distress.  HEENT: normocephalic, extraoccular movements normal; neck is supple without thyromegally   Breasts: Palpation of both breasts reveal no masses nipple discharge or skin changes. There are no axillary lymph nodes. Lynphnodes: Supraclavicular and inguinal  nodes not enlarged  Abdomen: Soft, non-tender, no ascites, no organomegally, no masses, no hernias Pfannenstiel incision is well-healed Pelvic:  EGBUS: Normal female  Vagina: Normal, no lesions  Urethra and Bladder: Normal, non-tender  Cervix: Surgically absent  Uterus: Surgically absent  Bi-manual examination: Non-tender; no adenxal masses or nodularity  Rectal: normal sphincter tone, no masses, no blood  Lower extremities: No edema or varicosities. Normal range of motion      No Known Allergies  Past Medical History  Diagnosis Date  . Asthma     recent bronchitis-rescue inhaler  . History of kidney stones   . 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 Ectopic pregnancy surgery    . Essure tubal ligation  2010  . Leep    . Diagnostic laparoscopy  2007    ectopic  . Cervical conization w/bx  03/24/2012    Procedure: CONIZATION CERVIX WITH BIOPSY;  Surgeon: Woodroe Mode, MD;  Location: [facility];  Service: Gynecology;  Laterality: N/A;  . Insertion of suprapubic catheter  05/03/2012    Procedure: INSERTION OF SUPRAPUBIC CATHETER;  Surgeon: Alvino Chapel, MD;  Location: [facility];  Service: Gynecology;;  . Radical hysterectomy  05/03/2012    Procedure: RADICAL HYSTERECTOMY;  Surgeon: Alvino Chapel, MD;  Location: [facility];  Service: Gynecology;;  transposition of the ovaries  . Lymphadenectomy  05/03/2012    Procedure: LYMPHADENECTOMY;  Surgeon: Alvino Chapel, MD;  Location: [facility];  Service: Gynecology;  Laterality: Bilateral;  pelvic   .  Abdominal hysterectomy      Current Outpatient Prescriptions  Medication Sig Dispense Refill  . ipratropium (ATROVENT) 0.06 % nasal spray Place 2 sprays into both nostrils 4 (four) times daily.  15 mL  12  . methocarbamol (ROBAXIN) 500 MG tablet Take 2 tablets (1,000 mg total) by mouth 4 (four) times daily as needed for muscle spasms (muscle  spasm/pain).  25 tablet  0  . Multiple Vitamins-Minerals (MULTIVITAMIN WITH MINERALS) tablet Take 1 tablet by mouth daily.      . norethindrone-ethinyl estradiol 1/35 (ORTHO-NOVUM, NORTREL,CYCLAFEM) tablet Take 1 tablet by mouth daily.  1 Package  11  . guaiFENesin-codeine (GUIATUSS AC) 100-10 MG/5ML syrup Take 10 mLs by mouth 4 (four) times daily as needed for cough.  120 mL  0  . HYDROcodone-acetaminophen (NORCO/VICODIN) 5-325 MG per tablet 1 or 2 tabs PO q6 hours prn pain  20 tablet  0  . ibuprofen (ADVIL,MOTRIN) 200 MG tablet Take 800 mg by mouth every 6 (six) hours as needed for pain.       . naproxen (NAPROSYN) 250 MG tablet Take 1 tablet (250 mg total) by mouth 2 (two) times daily with a meal.  14 tablet  0   No current facility-administered medications for this visit.    History   Social History  . Marital Status: Married    Spouse Name: N/A    Number of Children: N/A  . Years of Education: N/A   Occupational History  . Not on file.   Social History Main Topics  . Smoking status: Current Every Day Smoker -- 0.50 packs/day for 19 years    Types: Cigarettes  . Smokeless tobacco: Never Used     Comment: smoking cessation information given  . Alcohol Use: No  . Drug Use: No  . Sexual Activity: Yes    Birth Control/ Protection: Surgical   Other Topics Concern  . Not on file   Social History Narrative  . No narrative on file    Family History  Problem Relation Age of Onset  . Diabetes Mother   . Hypertension Father   . Diabetes Sister       Alvino Chapel, MD 10/06/2013, 3:04 PM

## 2013-10-06 NOTE — Patient Instructions (Signed)
We will get you rescheduled for your mammogram and give you a call with the date and time.  Plan to follow up with Dr. Fermin Schwab in six months or sooner if issues arise.  Avoid caffeine to see if left breast pain improves.  Please call in one or two months to schedule an appt for Oct 2015.  Fibrocystic Breast Changes Fibrocystic breast changes happens when tiny sacs filled with fluid form in the breast. They are not cancer. They can feel like lumps. HOME CARE  Check your breasts after every menstrual period or the first day of every month if you do not have menstrual periods. Check for:  Soreness.  New puffiness (swelling).  A change in breast size.  A change in a lump that was already there.  Only take medicine as told by your doctor.  Wear a support or sports bra that fits well.  Avoid caffeine in pop, chocolate, coffee, and tea. GET HELP IF:   You have fluid coming from your nipples, especially if it is bloody.  You have new lumps or bumps in your breast.  Your breast becomes puffy, red, and painful.  You have changes in how your breast looks.  Your nipples look flat or sunk in. Document Released: 06/04/2008 Document Revised: 02/22/2013 Document Reviewed: 12/11/2012 [facility] Patient Information 2014 [place].

## 2013-10-11 ENCOUNTER — Telehealth: Payer: Self-pay | Admitting: *Deleted

## 2013-10-11 NOTE — Telephone Encounter (Signed)
Notified pt of pap smear results 

## 2013-10-11 NOTE — Telephone Encounter (Signed)
Message copied by Venancio Chenier, Aletha Halim on Wed Oct 11, 2013  5:03 PM ------      Message from: CROSS, MELISSA D      Created: Wed Oct 11, 2013  4:49 PM       Please let her know that her pap smear is normal       ----- Message -----         From: [facility] Interface         Sent: 10/11/2013   2:51 PM           To: April Gibbs, NP                   ------

## 2013-10-13 ENCOUNTER — Other Ambulatory Visit: Payer: BC Managed Care – PPO

## 2013-10-23 ENCOUNTER — Other Ambulatory Visit: Payer: BC Managed Care – PPO

## 2013-11-02 ENCOUNTER — Ambulatory Visit
Admission: RE | Admit: 2013-11-02 | Discharge: 2013-11-02 | Disposition: A | Discharge status: Home / Self Care | Payer: Medicaid Other | Source: Ambulatory Visit | Attending: Gynecologic Oncology | Admitting: Gynecologic Oncology

## 2013-11-02 ENCOUNTER — Other Ambulatory Visit: Payer: Self-pay | Admitting: Gynecologic Oncology

## 2013-11-02 DIAGNOSIS — N644 Mastodynia: Secondary | ICD-10-CM

## 2013-11-02 DIAGNOSIS — N6452 Nipple discharge: Secondary | ICD-10-CM

## 2013-11-02 DIAGNOSIS — N6320 Unspecified lump in the left breast, unspecified quadrant: Secondary | ICD-10-CM

## 2013-11-09 ENCOUNTER — Other Ambulatory Visit: Payer: BC Managed Care – PPO

## 2013-11-16 ENCOUNTER — Ambulatory Visit
Admission: RE | Admit: 2013-11-16 | Discharge: 2013-11-16 | Disposition: A | Discharge status: Home / Self Care | Payer: Medicaid Other | Source: Ambulatory Visit | Attending: Gynecologic Oncology | Admitting: Gynecologic Oncology

## 2013-11-16 ENCOUNTER — Encounter (INDEPENDENT_AMBULATORY_CARE_PROVIDER_SITE_OTHER): Payer: Self-pay

## 2013-11-16 ENCOUNTER — Other Ambulatory Visit: Payer: Self-pay | Admitting: Gynecologic Oncology

## 2013-11-16 DIAGNOSIS — N6320 Unspecified lump in the left breast, unspecified quadrant: Secondary | ICD-10-CM

## 2013-12-30 ENCOUNTER — Ambulatory Visit (INDEPENDENT_AMBULATORY_CARE_PROVIDER_SITE_OTHER): Payer: BC Managed Care – PPO | Admitting: Family Medicine

## 2013-12-30 VITALS — BP 100/62 | HR 83 | Temp 98.5°F | Resp 16 | Ht 63.0 in | Wt 216.0 lb

## 2013-12-30 DIAGNOSIS — R350 Frequency of micturition: Secondary | ICD-10-CM

## 2013-12-30 DIAGNOSIS — J0100 Acute maxillary sinusitis, unspecified: Secondary | ICD-10-CM

## 2013-12-30 DIAGNOSIS — J069 Acute upper respiratory infection, unspecified: Secondary | ICD-10-CM

## 2013-12-30 LAB — POCT UA - MICROSCOPIC ONLY
CASTS, UR, LPF, POC: NEGATIVE
Crystals, Ur, HPF, POC: NEGATIVE
Mucus, UA: NEGATIVE
WBC, Ur, HPF, POC: NEGATIVE
YEAST UA: NEGATIVE

## 2013-12-30 LAB — POCT URINALYSIS DIPSTICK
BILIRUBIN UA: NEGATIVE
GLUCOSE UA: NEGATIVE
Ketones, UA: NEGATIVE
LEUKOCYTES UA: NEGATIVE
NITRITE UA: NEGATIVE
Protein, UA: NEGATIVE
RBC UA: NEGATIVE
Spec Grav, UA: 1.015
UROBILINOGEN UA: 0.2
pH, UA: 5.5

## 2013-12-30 LAB — GLUCOSE, POCT (MANUAL RESULT ENTRY): POC GLUCOSE: 85 mg/dL (ref 70–99)

## 2013-12-30 MED ORDER — LORATADINE-PSEUDOEPHEDRINE ER 5-120 MG PO TB12: 1.0000 | ORAL_TABLET | Freq: Two times a day (BID) | ORAL | Status: DC

## 2013-12-30 MED ORDER — AMOXICILLIN-POT CLAVULANATE 875-125 MG PO TABS: 1.0000 | ORAL_TABLET | Freq: Two times a day (BID) | ORAL | Status: DC

## 2013-12-30 NOTE — Patient Instructions (Signed)
Sinusitis Sinusitis is redness, soreness, and swelling (inflammation) of the paranasal sinuses. Paranasal sinuses are air pockets within the bones of your face (beneath the eyes, the middle of the forehead, or above the eyes). In healthy paranasal sinuses, mucus is able to drain out, and air is able to circulate through them by way of your nose. However, when your paranasal sinuses are inflamed, mucus and air can become trapped. This can allow bacteria and other germs to grow and cause infection. Sinusitis can develop quickly and last only a short time (acute) or continue over a long period (chronic). Sinusitis that lasts for more than 12 weeks is considered chronic.  CAUSES  Causes of sinusitis include:  Allergies.  Structural abnormalities, such as displacement of the cartilage that separates your nostrils (deviated septum), which can decrease the air flow through your nose and sinuses and affect sinus drainage.  Functional abnormalities, such as when the small hairs (cilia) that line your sinuses and help remove mucus do not work properly or are not present. SYMPTOMS  Symptoms of acute and chronic sinusitis are the same. The primary symptoms are pain and pressure around the affected sinuses. Other symptoms include:  Upper toothache.  Earache.  Headache.  Bad breath.  Decreased sense of smell and taste.  A cough, which worsens when you are lying flat.  Fatigue.  Fever.  Thick drainage from your nose, which often is green and may contain pus (purulent).  Swelling and warmth over the affected sinuses. DIAGNOSIS  Your caregiver will perform a physical exam. During the exam, your caregiver may:  Look in your nose for signs of abnormal growths in your nostrils (nasal polyps).  Tap over the affected sinus to check for signs of infection.  View the inside of your sinuses (endoscopy) with a special imaging device with a light attached (endoscope), which is inserted into your  sinuses. If your caregiver suspects that you have chronic sinusitis, one or more of the following tests may be recommended:  Allergy tests.  Nasal culture--A sample of mucus is taken from your nose and sent to a lab and screened for bacteria.  Nasal cytology--A sample of mucus is taken from your nose and examined by your caregiver to determine if your sinusitis is related to an allergy. TREATMENT  Most cases of acute sinusitis are related to a viral infection and will resolve on their own within 10 days. Sometimes medicines are prescribed to help relieve symptoms (pain medicine, decongestants, nasal steroid sprays, or saline sprays).  However, for sinusitis related to a bacterial infection, your caregiver will prescribe antibiotic medicines. These are medicines that will help kill the bacteria causing the infection.  Rarely, sinusitis is caused by a fungal infection. In theses cases, your caregiver will prescribe antifungal medicine. For some cases of chronic sinusitis, surgery is needed. Generally, these are cases in which sinusitis recurs more than 3 times per year, despite other treatments. HOME CARE INSTRUCTIONS   Drink plenty of water. Water helps thin the mucus so your sinuses can drain more easily.  Use a humidifier.  Inhale steam 3 to 4 times a day (for example, sit in the bathroom with the shower running).  Apply a warm, moist washcloth to your face 3 to 4 times a day, or as directed by your caregiver.  Use saline nasal sprays to help moisten and clean your sinuses.  Take over-the-counter or prescription medicines for pain, discomfort, or fever only as directed by your caregiver. SEEK IMMEDIATE MEDICAL CARE IF:    You have increasing pain or severe headaches.  You have nausea, vomiting, or drowsiness.  You have swelling around your face.  You have vision problems.  You have a stiff neck.  You have difficulty breathing. MAKE SURE YOU:   Understand these  instructions.  Will watch your condition.  Will get help right away if you are not doing well or get worse. Document Released: 06/22/2005 Document Revised: 09/14/2011 Document Reviewed: 07/07/2011 [employer] Patient Information 2015 [employer]. This information is not intended to replace advice given to you by your health care provider. Make sure you discuss any questions you have with your health care provider.  

## 2013-12-30 NOTE — Progress Notes (Signed)
Subjective:    Patient ID: April Ayala, female    DOB: [birth year], 38 y.o.   MRN: [id number]  12/30/2013  Nasal Congestion, Urinary Frequency and Fever   Urinary Frequency  Associated symptoms include chills, flank pain, frequency and urgency. Pertinent negatives include no hematuria.  Fever  Associated symptoms include congestion, coughing, ear pain, headaches and a sore throat. Pertinent negatives include no wheezing.   This 38 y.o. female presents for evaluation of nasal congestion, urinary frequency, fever.  1. Nasal congestion:   Onset one week ago.  +fever Tmax 101.0 four days ago.  +chills/sweats.  +HA.  R ear > L ear pain.  +rhinorrhea; +nasal congestion; +weak and tired.  +PND.  +coughing; No sputum.  Intermittent SOB; mild.  No v/d.  No rash.  Taking Sudafed every four hours.  Multiple sick contacts; son in daycare.  [employer] shift supervisor.  +Tobacco.  ST initially severe and now mild sore throat.  History of asthma bronchitis once in past; no history of asthma.  2.  Urinary frequency:  Onset three days ago.  No dysuria; no hematuria.  Nocturia x 2-3; baseline x 0.  Mild flank pain/back pain.  History of kidney stone; this feels different.  +nausea four weeks ago.  Denies vaginal discharge, itching, burning, or pain.  S/p hysterectomy for cervical cancer two years ago.  PCP: none   Review of Systems  Constitutional: Positive for fever, chills and diaphoresis.  HENT: Positive for congestion, ear pain, postnasal drip, rhinorrhea, sinus pressure, sore throat and voice change. Negative for trouble swallowing.   Respiratory: Positive for cough and shortness of breath. Negative for wheezing.   Endocrine: Positive for polyuria. Negative for polydipsia and polyphagia.  Genitourinary: Positive for urgency, frequency and flank pain. Negative for dysuria, hematuria, vaginal discharge, genital sores, vaginal pain and pelvic pain.  Neurological: Positive for headaches. Negative for  dizziness.    Past Medical History  Diagnosis Date  . Asthma     recent bronchitis-rescue inhaler  . History of kidney stones   . 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 Ectopic pregnancy surgery    . Essure tubal ligation  2010  . Leep    . Diagnostic laparoscopy  2007    ectopic  . Cervical conization w/bx  03/24/2012    Procedure: CONIZATION CERVIX WITH BIOPSY;  Surgeon: Woodroe Mode, MD;  Location: [facility];  Service: Gynecology;  Laterality: N/A;  . Insertion of suprapubic catheter  05/03/2012    Procedure: INSERTION OF SUPRAPUBIC CATHETER;  Surgeon: Alvino Chapel, MD;  Location: [facility];  Service: Gynecology;;  . Radical hysterectomy  05/03/2012    Procedure: RADICAL HYSTERECTOMY;  Surgeon: Alvino Chapel, MD;  Location: [facility];  Service: Gynecology;;  transposition of the ovaries  . Lymphadenectomy  05/03/2012    Procedure: LYMPHADENECTOMY;  Surgeon: Alvino Chapel, MD;  Location: [facility];  Service: Gynecology;  Laterality: Bilateral;  pelvic   . Abdominal hysterectomy      No Known Allergies Current Outpatient Prescriptions  Medication Sig Dispense Refill  . ibuprofen (ADVIL,MOTRIN) 200 MG tablet Take 800 mg by mouth every 6 (six) hours as needed for pain.       . Multiple Vitamins-Minerals (MULTIVITAMIN WITH MINERALS) tablet Take 1 tablet by mouth daily.      Marland Kitchen amoxicillin-clavulanate (AUGMENTIN) 875-125 MG per tablet Take 1 tablet by mouth 2 (two) times daily.  30 tablet  0  . loratadine-pseudoephedrine (CLARITIN-D 12-HOUR) 5-120 MG per tablet Take 1 tablet by mouth 2 (two) times daily.  20 tablet  0   No current facility-administered medications for this visit.   History   Social History  . Marital Status: Single    Spouse Name: N/A    Number of Children: N/A  . Years of Education: N/A   Occupational History  .      [employer] Supervisor   Social History Main  Topics  . Smoking status: Current Every Day Smoker -- 0.50 packs/day for 19 years    Types: Cigarettes  . Smokeless tobacco: Never Used     Comment: smoking cessation information given  . Alcohol Use: No  . Drug Use: No  . Sexual Activity: Yes    Birth Control/ Protection: Surgical   Other Topics Concern  . Not on file   Social History Narrative  . No narrative on file       Objective:    BP 100/62  Pulse 83  Temp(Src) 98.5 F (36.9 C) (Oral)  Resp 16  Ht 5\' 3"  (1.6 m)  Wt 216 lb (97.977 kg)  BMI 38.27 kg/m2  SpO2 100%  LMP 04/11/2012 Physical Exam  Nursing note and vitals reviewed. Constitutional: She is oriented to person, place, and time. She appears well-developed and well-nourished. No distress.  HENT:  Head: Normocephalic and atraumatic.  Left Ear: No swelling or tenderness. Tympanic membrane is erythematous. Tympanic membrane is not injected, not retracted and not bulging.  Nose: Mucosal edema and rhinorrhea present. Right sinus exhibits maxillary sinus tenderness. Right sinus exhibits no frontal sinus tenderness. Left sinus exhibits maxillary sinus tenderness. Left sinus exhibits no frontal sinus tenderness.  Mouth/Throat: Uvula is midline and mucous membranes are normal. Posterior oropharyngeal erythema present. No oropharyngeal exudate or tonsillar abscesses.  Eyes: Conjunctivae are normal. Pupils are equal, round, and reactive to light.  Neck: Normal range of motion. Neck supple.  Cardiovascular: Normal rate, regular rhythm and normal heart sounds.  Exam reveals no gallop and no friction rub.   No murmur heard. Pulmonary/Chest: Effort normal and breath sounds normal. She has no wheezes. She has no rales.  Abdominal: Soft. Bowel sounds are normal. She exhibits no distension and no mass. There is no tenderness. There is no rebound, no guarding and no CVA tenderness.  Neurological: She is alert and oriented to person, place, and time.  Skin: She is not  diaphoretic.  Psychiatric: She has a normal mood and affect. Her behavior is normal.   Results for orders placed in visit on 12/30/13  POCT URINALYSIS DIPSTICK      Result Value Ref Range   Color, UA yellow     Clarity, UA clear     Glucose, UA neg     Bilirubin, UA neg     Ketones, UA neg     Spec Grav, UA 1.015     Blood, UA neg     pH, UA 5.5     Protein, UA neg     Urobilinogen, UA 0.2     Nitrite, UA neg     Leukocytes, UA Negative    GLUCOSE, POCT (MANUAL RESULT ENTRY)      Result Value Ref Range   POC Glucose 85  70 - 99 mg/dl  POCT UA - MICROSCOPIC ONLY      Result Value Ref Range   WBC, Ur, HPF, POC neg     RBC, urine, microscopic 0-1  Bacteria, U Microscopic trace     Mucus, UA neg     Epithelial cells, urine per micros 0-2     Crystals, Ur, HPF, POC neg     Casts, Ur, LPF, POC neg     Yeast, UA neg         Assessment & Plan:  Frequent urination - Plan: POCT urinalysis dipstick, POCT urinalysis dipstick, Urine culture, POCT glucose (manual entry), POCT UA - Microscopic Only, CANCELED: POCT UA - Microscopic Only, CANCELED: POCT UA - Microscopic Only  Acute upper respiratory infections of unspecified site - Plan: POCT UA - Microscopic Only  Acute maxillary sinusitis, recurrence not specified - Plan: POCT UA - Microscopic Only  1.  Acute maxillary sinusitis:  New. Rx for Augmentin and Claritin-D provided.  Pt declined rx for nasal spray. 2.  Acute URI:  New. Etiology to sinusitis; rx for Claritin D provided. 3. Urinary Frequency:  New. Send urine culture; negative u/a in office; normal glucose.   Meds ordered this encounter  Medications  . amoxicillin-clavulanate (AUGMENTIN) 875-125 MG per tablet    Sig: Take 1 tablet by mouth 2 (two) times daily.    Dispense:  30 tablet    Refill:  0  . loratadine-pseudoephedrine (CLARITIN-D 12-HOUR) 5-120 MG per tablet    Sig: Take 1 tablet by mouth 2 (two) times daily.    Dispense:  20 tablet    Refill:  0    No  Follow-up on file.  Reginia Forts, M.D.  [facility] [address] [place], [place]  [address] [phone] phone [phone] fax

## 2013-12-31 LAB — URINE CULTURE
Colony Count: NO GROWTH
ORGANISM ID, BACTERIA: NO GROWTH

## 2014-05-11 ENCOUNTER — Ambulatory Visit: Payer: Medicaid Other | Attending: Gynecology | Admitting: Gynecology

## 2014-05-11 ENCOUNTER — Ambulatory Visit: Payer: Medicaid Other

## 2014-05-11 ENCOUNTER — Other Ambulatory Visit (HOSPITAL_COMMUNITY)
Admission: RE | Admit: 2014-05-11 | Discharge: 2014-05-11 | Disposition: A | Discharge status: Home / Self Care | Payer: Medicaid Other | Source: Ambulatory Visit | Attending: Gynecology | Admitting: Gynecology

## 2014-05-11 ENCOUNTER — Encounter: Payer: Self-pay | Admitting: Gynecology

## 2014-05-11 VITALS — BP 111/81 | HR 103 | Temp 98.7°F | Resp 20 | Ht 63.0 in | Wt 224.4 lb

## 2014-05-11 DIAGNOSIS — Z8541 Personal history of malignant neoplasm of cervix uteri: Secondary | ICD-10-CM

## 2014-05-11 DIAGNOSIS — Z87442 Personal history of urinary calculi: Secondary | ICD-10-CM | POA: Diagnosis not present

## 2014-05-11 DIAGNOSIS — R309 Painful micturition, unspecified: Secondary | ICD-10-CM | POA: Insufficient documentation

## 2014-05-11 DIAGNOSIS — C539 Malignant neoplasm of cervix uteri, unspecified: Secondary | ICD-10-CM | POA: Diagnosis not present

## 2014-05-11 DIAGNOSIS — F17210 Nicotine dependence, cigarettes, uncomplicated: Secondary | ICD-10-CM | POA: Diagnosis not present

## 2014-05-11 DIAGNOSIS — Z90710 Acquired absence of both cervix and uterus: Secondary | ICD-10-CM | POA: Insufficient documentation

## 2014-05-11 DIAGNOSIS — J45909 Unspecified asthma, uncomplicated: Secondary | ICD-10-CM | POA: Insufficient documentation

## 2014-05-11 DIAGNOSIS — R350 Frequency of micturition: Secondary | ICD-10-CM

## 2014-05-11 DIAGNOSIS — Z01411 Encounter for gynecological examination (general) (routine) with abnormal findings: Secondary | ICD-10-CM | POA: Diagnosis present

## 2014-05-11 LAB — URINALYSIS, MICROSCOPIC - CHCC
BILIRUBIN (URINE): NEGATIVE
Blood: NEGATIVE
Glucose: NEGATIVE mg/dL
Ketones: NEGATIVE mg/dL
Nitrite: NEGATIVE
PH: 6 (ref 4.6–8.0)
Protein: NEGATIVE mg/dL
RBC / HPF: NEGATIVE (ref 0–2)
Specific Gravity, Urine: 1.015 (ref 1.003–1.035)
Urobilinogen, UR: 0.2 mg/dL (ref 0.2–1)

## 2014-05-11 NOTE — Patient Instructions (Signed)
We will call you with your pap smear results.  Follow up in 6 months with Dr. Fermin Schwab

## 2014-05-11 NOTE — Progress Notes (Signed)
Consult Note: Gyn-Onc   April Ayala 38 y.o. female  Chief Complaint  Patient presents with  . 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 No evidence disease.   Urinary frequency and bladder discomfort.  Plan: Pap smears are obtained we'll obtain a urine culture to rule out a urinary tract infection. If this is negative I would recommend she have a urological consultation for evaluation of bladder function. Pap smears are obtained. She return in 6 months to see me.  Interval History: Patient returns today as previously scheduled. Overall she's done well. Her primary concern today is that she has bladder discomfort and urinary frequency. She also feels that her bladder doesn't completely empty. She's use Pyridium with only slight benefit. She denies specifically dysuria.She denies any otherGI or GU symptoms has no vaginal or pelvic pain.  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 Final pathology showed negative lymph nodes negative margins and cervical stroma invasion of less than 50%. No postoperative therapy was advised. Review of Systems:10 point review of systems is negative except as noted in interval history.   Vitals: Blood pressure 111/81, pulse 103, temperature 98.7 F (37.1 C), temperature source Oral, resp. rate 20, height 5\' 3"  (1.6 m), weight 224 lb 6.4 oz (101.787 kg), last menstrual period 04/11/2012.  Physical Exam: General : The patient is a healthy woman in no acute distress.  HEENT: normocephalic, extraoccular movements normal; neck is supple without thyromegally  Lynphnodes: Supraclavicular and inguinal nodes not enlarged  Abdomen: Soft, non-tender, no ascites, no organomegally, no masses, no hernias Pfannenstiel incision is well-healed Pelvic:  EGBUS: Normal female  Vagina: Normal, no lesions  Urethra and Bladder: Normalalthough on  palpation it seems that the patient's bladder is full ( the patient does not feel that is full however) Cervix: Surgically absent  Uterus: Surgically absent  Bi-manual examination: Non-tender; no adenxal masses or nodularity  Rectal: normal sphincter tone, no masses, no blood  Lower extremities: No edema or varicosities. Normal range of motion      No Known Allergies  Past Medical History  Diagnosis Date  . Asthma     recent bronchitis-rescue inhaler  . History of kidney stones   . 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 Ectopic pregnancy surgery    . Essure tubal ligation  2010  . Leep    . Diagnostic laparoscopy  2007    ectopic  . Cervical conization w/bx  03/24/2012    Procedure: CONIZATION CERVIX WITH BIOPSY;  Surgeon: Woodroe Mode, MD;  Location: [facility];  Service: Gynecology;  Laterality: N/A;  . Insertion of suprapubic catheter  05/03/2012    Procedure: INSERTION OF SUPRAPUBIC CATHETER;  Surgeon: Alvino Chapel, MD;  Location: [facility];  Service: Gynecology;;  . Radical hysterectomy  05/03/2012    Procedure: RADICAL HYSTERECTOMY;  Surgeon: Alvino Chapel, MD;  Location: [facility];  Service: Gynecology;;  transposition of the ovaries  . Lymphadenectomy  05/03/2012    Procedure: LYMPHADENECTOMY;  Surgeon: Alvino Chapel, MD;  Location: [facility];  Service: Gynecology;  Laterality: Bilateral;  pelvic   . Abdominal hysterectomy      Current Outpatient Prescriptions  Medication Sig Dispense Refill  . Multiple Vitamins-Minerals (MULTIVITAMIN WITH MINERALS) tablet Take 1 tablet by mouth daily.    Marland Kitchen  ibuprofen (ADVIL,MOTRIN) 200 MG tablet Take 800 mg by mouth every 6 (six) hours as needed for pain.     Marland Kitchen loratadine-pseudoephedrine (CLARITIN-D 12-HOUR) 5-120 MG per tablet Take 1 tablet by mouth 2 (two) times daily. 20 tablet 0   No current facility-administered medications for this visit.     History   Social History  . Marital Status: Single    Spouse Name: N/A    Number of Children: N/A  . 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 Smoking status: Current Every Day Smoker -- 0.25 packs/day for 19 years    Types: Cigarettes  . Smokeless tobacco: Never Used     Comment: smoking cessation information given  . Alcohol Use: No  . Drug Use: No  . Sexual Activity: Yes    Birth Control/ Protection: Surgical   Other Topics Concern  . Not on file   Social History Narrative    Family History  Problem Relation Age of Onset  . Diabetes Mother   . Hypertension Father   . Diabetes Sister       Alvino Chapel, MD 05/11/2014, 9:35 AM

## 2014-05-13 LAB — URINE CULTURE

## 2014-05-16 LAB — CYTOLOGY - PAP

## 2014-05-18 ENCOUNTER — Telehealth: Payer: Self-pay | Admitting: *Deleted

## 2014-05-18 NOTE — Telephone Encounter (Signed)
Called pt, lmovm pap smear resutls were normal. Results did show some yeast and if she is having symptoms she could purchase Monistat over the counter. Pt to call office withany concerns or questions.

## 2014-05-18 NOTE — Telephone Encounter (Signed)
-----   Message from Dorothyann Gibbs, NP sent at 05/17/2014  1:42 PM EST ----- Please let her know that her pap smear is normal.  It is showing some yeast so if she is having symptoms she could purchase Monistat over the counter.   ----- Message -----    From: [facility] Interface    Sent: 05/16/2014   4:43 PM      To: Dorothyann Gibbs, NP

## 2014-08-09 ENCOUNTER — Emergency Department (INDEPENDENT_AMBULATORY_CARE_PROVIDER_SITE_OTHER)
Admission: EM | Admit: 2014-08-09 | Discharge: 2014-08-09 | Disposition: A | Discharge status: Home / Self Care | Payer: PRIVATE HEALTH INSURANCE | Source: Home / Self Care | Attending: Emergency Medicine | Admitting: Emergency Medicine

## 2014-08-09 ENCOUNTER — Encounter (HOSPITAL_COMMUNITY): Payer: Self-pay | Admitting: Emergency Medicine

## 2014-08-09 DIAGNOSIS — G5603 Carpal tunnel syndrome, bilateral upper limbs: Secondary | ICD-10-CM

## 2014-08-09 DIAGNOSIS — J020 Streptococcal pharyngitis: Secondary | ICD-10-CM

## 2014-08-09 DIAGNOSIS — G5602 Carpal tunnel syndrome, left upper limb: Secondary | ICD-10-CM

## 2014-08-09 DIAGNOSIS — G5601 Carpal tunnel syndrome, right upper limb: Secondary | ICD-10-CM

## 2014-08-09 MED ORDER — MELOXICAM 15 MG PO TABS: 15.0000 mg | ORAL_TABLET | Freq: Every day | ORAL | Status: DC

## 2014-08-09 MED ORDER — AMOXICILLIN 500 MG PO CAPS: 500.0000 mg | ORAL_CAPSULE | Freq: Two times a day (BID) | ORAL | Status: DC

## 2014-08-09 NOTE — ED Provider Notes (Signed)
CSN: [id number]     Arrival date & time 08/09/14  1137 History   First MD Initiated Contact with Patient 08/09/14 1202     Chief Complaint  Patient presents with  . Sore Throat   (Consider location/radiation/quality/duration/timing/severity/associated sxs/prior Treatment) HPI  She is a 39 year old woman here for evaluation of sore throat and hand numbness.  The sore throat started 3 days ago. It has gradually been getting worse. It is worse with swallowing. She is able to tolerate liquids. She has had some chills and body aches. No fevers. No nausea or vomiting. She reports some mild nasal congestion and mild ear aches. No cough.  She states over the last several months she has had worsening numbness in her bilateral hands. This is primarily limited to the index and middle fingers. She will also get shooting pains up her arm at night. 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 She does report some weakness in her hands as well.  Past Medical History  Diagnosis Date  . Asthma     recent bronchitis-rescue inhaler  . History of kidney stones   . 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 Ectopic pregnancy surgery    . Essure tubal ligation  2010  . Leep    . Diagnostic laparoscopy  2007    ectopic  . Cervical conization w/bx  03/24/2012    Procedure: CONIZATION CERVIX WITH BIOPSY;  Surgeon: Woodroe Mode, MD;  Location: [facility];  Service: Gynecology;  Laterality: N/A;  . Insertion of suprapubic catheter  05/03/2012    Procedure: INSERTION OF SUPRAPUBIC CATHETER;  Surgeon: Alvino Chapel, MD;  Location: [facility];  Service: Gynecology;;  . Radical hysterectomy  05/03/2012    Procedure: RADICAL HYSTERECTOMY;  Surgeon: Alvino Chapel, MD;  Location: [facility];  Service: Gynecology;;  transposition of the ovaries  . Lymphadenectomy  05/03/2012    Procedure: LYMPHADENECTOMY;  Surgeon:  Alvino Chapel, MD;  Location: [facility];  Service: Gynecology;  Laterality: Bilateral;  pelvic   . Abdominal hysterectomy     Family History  Problem Relation Age of Onset  . Diabetes Mother   . Hypertension Father   . Diabetes Sister    History  Substance Use Topics  . Smoking status: Current Every Day Smoker -- 0.25 packs/day for 19 years    Types: Cigarettes  . Smokeless tobacco: Never Used     Comment: smoking cessation information given  . Alcohol Use: No   OB History    Gravida Para Term Preterm AB TAB SAB Ectopic Multiple Living   4 4 4       4      Review of Systems As in history of present illness Allergies  Review of patient's allergies indicates no known allergies.  Home Medications   Prior to Admission medications   Medication Sig Start Date End Date Taking? Authorizing Provider  amoxicillin (AMOXIL) 500 MG capsule Take 1 capsule (500 mg total) by mouth 2 (two) times daily. 08/09/14   Melony Overly, MD  ibuprofen (ADVIL,MOTRIN) 200 MG tablet Take 800 mg by mouth every 6 (six) hours as needed for pain.     Historical Provider, MD  loratadine-pseudoephedrine (CLARITIN-D 12-HOUR) 5-120 MG per tablet Take 1 tablet by mouth 2 (two) times daily. 12/30/13   Wardell Honour, MD  meloxicam (MOBIC) 15 MG tablet Take 1 tablet (15 mg total)  by mouth daily. 08/09/14   Melony Overly, MD  Multiple Vitamins-Minerals (MULTIVITAMIN WITH MINERALS) tablet Take 1 tablet by mouth daily.    Historical Provider, MD   BP 119/85 mmHg  Pulse 81  Temp(Src) 98.8 F (37.1 C) (Oral)  Resp 18  SpO2 98%  LMP 04/11/2012 Physical Exam  Constitutional: She is oriented to person, place, and time. She appears well-developed and well-nourished. No distress.  HENT:  Head: Normocephalic and atraumatic.  Right Ear: Tympanic membrane normal.  Left Ear: Tympanic membrane normal.  Nose: No mucosal edema or rhinorrhea.  Mouth/Throat: Mucous membranes are not dry. Posterior oropharyngeal erythema  present. No oropharyngeal exudate.  Neck: Neck supple.  Cardiovascular: Normal rate, regular rhythm and normal heart sounds.   No murmur heard. Pulmonary/Chest: Breath sounds normal. No respiratory distress. She has no wheezes. She has no rales.  Musculoskeletal:  Hands: Positive Phalen. Negative Tinel's. 5 out of 5 grip strength and interosseous strength. Subjective numbness to left index and middle fingers.  Lymphadenopathy:    She has no cervical adenopathy.  Neurological: She is alert and oriented to person, place, and time.    ED Course  Procedures (including critical care time) Labs Review Labs Reviewed - No data to display  Imaging Review No results found.   MDM   1. Strep pharyngitis   2. Carpal tunnel syndrome, bilateral    Will treat strep throat with amoxicillin. Bilateral wrist brace for carpal tunnel. We'll also give meloxicam 15 mg daily. Discussed that she should only take 1 ibuprofen a day while on this medicine. Follow-up as needed.    Melony Overly, MD 08/09/14 [phone]

## 2014-08-09 NOTE — Discharge Instructions (Signed)
You likely have strep throat. Take amoxicillin twice a day for 10 days.  Your hand issue is due to carpal tunnel. Take meloxicam daily for the next 2 weeks. You can take ibuprofen once a day while on the meloxicam. Wear the wrist braces at night and while you are at work. You should see some improvement in the next 2 weeks. Follow-up as needed.

## 2014-08-09 NOTE — ED Notes (Signed)
Multiple complaints: sore throat  And c/o hand numbness.

## 2014-11-08 ENCOUNTER — Ambulatory Visit: Payer: Medicaid Other | Attending: Gynecology | Admitting: Gynecology

## 2014-11-22 ENCOUNTER — Telehealth: Payer: Self-pay | Admitting: *Deleted

## 2014-11-22 NOTE — Telephone Encounter (Signed)
Pt was a no-show for scheduled appointment with Dr. Fermin Schwab on 11/08/2014. A message was left asking pt to call GYN Oncology at [phone] to reschedule her appointment with Dr. Fermin Schwab.

## 2015-02-15 ENCOUNTER — Ambulatory Visit: Payer: 59 | Attending: Gynecology | Admitting: Gynecology

## 2015-02-15 ENCOUNTER — Encounter: Payer: Self-pay | Admitting: Gynecology

## 2015-02-15 ENCOUNTER — Other Ambulatory Visit: Payer: PRIVATE HEALTH INSURANCE

## 2015-02-15 ENCOUNTER — Other Ambulatory Visit (HOSPITAL_COMMUNITY)
Admission: RE | Admit: 2015-02-15 | Discharge: 2015-02-15 | Disposition: A | Discharge status: Home / Self Care | Payer: 59 | Source: Ambulatory Visit | Attending: Gynecology | Admitting: Gynecology

## 2015-02-15 VITALS — BP 115/83 | HR 95 | Temp 98.6°F | Resp 18 | Ht 63.0 in | Wt 220.0 lb

## 2015-02-15 DIAGNOSIS — R300 Dysuria: Secondary | ICD-10-CM

## 2015-02-15 DIAGNOSIS — C539 Malignant neoplasm of cervix uteri, unspecified: Secondary | ICD-10-CM | POA: Insufficient documentation

## 2015-02-15 DIAGNOSIS — Z01411 Encounter for gynecological examination (general) (routine) with abnormal findings: Secondary | ICD-10-CM | POA: Insufficient documentation

## 2015-02-15 MED ORDER — SULFAMETHOXAZOLE-TRIMETHOPRIM 800-160 MG PO TABS: 1.0000 | ORAL_TABLET | Freq: Two times a day (BID) | ORAL | Status: DC

## 2015-02-15 NOTE — Patient Instructions (Addendum)
Take Septra DS twice a day for 7 days.  We will contact you the Pap smear. Return in 6 months.  Please call closer to the date to schedule your appointment.  We will also contact you with the results of your urine culture.  Sulfamethoxazole; Trimethoprim, SMX-TMP tablets What is this medicine? SULFAMETHOXAZOLE; TRIMETHOPRIM or SMX-TMP (suhl fuh meth OK suh zohl; trye METH oh prim) is a combination of a sulfonamide antibiotic and a second antibiotic, trimethoprim. It is used to treat or prevent certain kinds of bacterial infections. It will not work for colds, flu, or other viral infections. This medicine may be used for other purposes; ask your health care provider or pharmacist if you have questions. COMMON BRAND NAME(S): Bacter-Aid DS, Bactrim, Bactrim DS, Septra, Septra DS What should I tell my health care provider before I take this medicine? They need to know if you have any of these conditions: -anemia -asthma -being treated with anticonvulsants -if you frequently drink alcohol containing drinks -kidney disease -liver disease -low level of folic acid or [id number] dehydrogenase -poor nutrition or malabsorption -porphyria -severe allergies -thyroid disorder -an unusual or allergic reaction to sulfamethoxazole, trimethoprim, sulfa drugs, other medicines, foods, dyes, or preservatives -pregnant or trying to get pregnant -breast-feeding How should I use this medicine? Take this medicine by mouth with a full glass of water. Follow the directions on the prescription label. Take your medicine at regular intervals. Do not take it more often than directed. Do not skip doses or stop your medicine early. Talk to your pediatrician regarding the use of this medicine in children. Special care may be needed. This medicine has been used in children as young as 18 months of age. Overdosage: If you think you have taken too much of this medicine contact a poison control center or emergency  room at once. NOTE: This medicine is only for you. Do not share this medicine with others. What if I miss a dose? If you miss a dose, take it as soon as you can. If it is almost time for your next dose, take only that dose. Do not take double or extra doses. What may interact with this medicine? Do not take this medicine with any of the following medications: -aminobenzoate potassium -dofetilide -metronidazole This medicine may also interact with the following medications: -ACE inhibitors like benazepril, enalapril, lisinopril, and ramipril -birth control pills -cyclosporine -digoxin -diuretics -indomethacin -medicines for diabetes -methenamine -methotrexate -phenytoin -potassium supplements -pyrimethamine -sulfinpyrazone -tricyclic antidepressants -warfarin This list may not describe all possible interactions. Give your health care provider a list of all the medicines, herbs, non-prescription drugs, or dietary supplements you use. Also tell them if you smoke, drink alcohol, or use illegal drugs. Some items may interact with your medicine. What should I watch for while using this medicine? Tell your doctor or health care professional if your symptoms do not improve. Drink several glasses of water a day to reduce the risk of kidney problems. Do not treat diarrhea with over the counter products. Contact your doctor if you have diarrhea that lasts more than 2 days or if it is severe and watery. This medicine can make you more sensitive to the sun. Keep out of the sun. If you cannot avoid being in the sun, wear protective clothing and use a sunscreen. Do not use sun lamps or tanning beds/booths. What side effects may I notice from receiving this medicine? Side effects that you should report to your doctor or health care professional as soon as  possible: -allergic reactions like skin rash or hives, swelling of the face, lips, or tongue -breathing problems -fever or chills, sore  throat -irregular heartbeat, chest pain -joint or muscle pain -pain or difficulty passing urine -red pinpoint spots on skin -redness, blistering, peeling or loosening of the skin, including inside the mouth -unusual bleeding or bruising -unusually weak or tired -yellowing of the eyes or skin Side effects that usually do not require medical attention (report to your doctor or health care professional if they continue or are bothersome): -diarrhea -dizziness -headache -loss of appetite -nausea, vomiting -nervousness This list may not describe all possible side effects. Call your doctor for medical advice about side effects. You may report side effects to FDA at 1-800-FDA-1088. Where should I keep my medicine? Keep out of the reach of children. Store at room temperature between 20 to 25 degrees C (68 to 77 degrees F). Protect from light. Throw away any unused medicine after the expiration date. NOTE: This sheet is a summary. It may not cover all possible information. If you have questions about this medicine, talk to your doctor, pharmacist, or health care provider.  2015, Elsevier/Gold Standard. (2013-01-27 14:38:26)

## 2015-02-15 NOTE — Progress Notes (Signed)
Consult Note: Gyn-Onc   April Ayala 39 y.o. female  Chief Complaint  Patient presents with  . 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 Her graft symptoms suggestive of urinary tract infection.  Plan: Pap smears are obtained we'll obtain a urine culture to rule out a urinary tract infection. Given the extent of her symptoms we will initiate treatment using Septra DS 1 twice a day for 7 days. If the symptoms do not resolve she will contact us. Otherwise she return in 6 months. Interval History: Patient returns today as previously scheduled. Overall she's done well. Her primary concern today is that she has bladder discomfort and urinary frequency (these are similar symptoms to 6 months ago. However after being seen 6 months ago the symptoms resolved until 2 weeks ago. She thinks she may have a low-grade fever she denies any chills. She does not have any flank pain. She does have urinary frequency 4-5 times at night and commonly during the day at work. At her last visit a urine culture showed 50,000 Escherichia coli.She denies any otherGI or GU symptoms has no vaginal or pelvic pain.  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 Final pathology showed negative lymph nodes negative margins and cervical stroma invasion of less than 50%. No postoperative therapy was advised. Review of Systems:10 point review of systems is negative except as noted in interval history.   Vitals: Blood pressure 115/83, pulse 95, temperature 98.6 F (37 C), temperature source Oral, resp. rate 18, height 5\' 3"  (1.6 m), weight 220 lb (99.791 kg), last menstrual period 04/11/2012.  Physical Exam: General : The patient is a healthy woman in no acute distress.  HEENT: normocephalic, extraoccular movements normal; neck is supple without thyromegally  Lynphnodes:  Supraclavicular and inguinal nodes not enlarged  Abdomen: Soft, non-tender, no ascites, no organomegally, no masses, no hernias Pfannenstiel incision is well-healed Pelvic:  EGBUS: Normal female  Vagina: Normal, no lesions  Urethra and Bladder: Normalalthough on palpation the bladder sensation is uncomfortable. Cervix: Surgically absent  Uterus: Surgically absent  Bi-manual examination: Non-tender; no adenxal masses or nodularity  Rectal: normal sphincter tone, no masses, no blood  Lower extremities: No edema or varicosities. Normal range of motion      No Known Allergies  Past Medical History  Diagnosis Date  . Asthma     recent bronchitis-rescue inhaler  . History of kidney stones   . 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 Ectopic pregnancy surgery    . Essure tubal ligation  2010  . Leep    . Diagnostic laparoscopy  2007    ectopic  . Cervical conization w/bx  03/24/2012    Procedure: CONIZATION CERVIX WITH BIOPSY;  Surgeon: Woodroe Mode, MD;  Location: [facility];  Service: Gynecology;  Laterality: N/A;  . Insertion of suprapubic catheter  05/03/2012    Procedure: INSERTION OF SUPRAPUBIC CATHETER;  Surgeon: Alvino Chapel, MD;  Location: [facility];  Service: Gynecology;;  . Radical hysterectomy  05/03/2012    Procedure: RADICAL HYSTERECTOMY;  Surgeon: Alvino Chapel, MD;  Location: [facility];  Service: Gynecology;;  transposition of the ovaries  . Lymphadenectomy  05/03/2012    Procedure: LYMPHADENECTOMY;  Surgeon: Alvino Chapel, MD;  Location: [facility];  Service:  Gynecology;  Laterality: Bilateral;  pelvic   . Abdominal hysterectomy      Current Outpatient Prescriptions  Medication Sig Dispense Refill  . ibuprofen (ADVIL,MOTRIN) 200 MG tablet Take 800 mg by mouth every 6 (six) hours as needed for pain.     . Multiple Vitamins-Minerals (MULTIVITAMIN WITH MINERALS) tablet Take 1  tablet by mouth daily.    Marland Kitchen loratadine-pseudoephedrine (CLARITIN-D 12-HOUR) 5-120 MG per tablet Take 1 tablet by mouth 2 (two) times daily. (Patient not taking: Reported on 02/15/2015) 20 tablet 0  . meloxicam (MOBIC) 15 MG tablet Take 1 tablet (15 mg total) by mouth daily. (Patient not taking: Reported on 02/15/2015) 30 tablet 0   No current facility-administered medications for this visit.    Social History   Social History  . Marital Status: Single    Spouse Name: N/A  . Number of Children: N/A  . Years of Education: N/A   Occupational History  .      [employer] Supervisor   Social History Main Topics  . Smoking status: Current Every Day Smoker -- 0.25 packs/day for 19 years    Types: Cigarettes  . Smokeless tobacco: Never Used     Comment: smoking cessation information given  . Alcohol Use: No  . Drug Use: No  . Sexual Activity: Yes    Birth Control/ Protection: Surgical   Other Topics Concern  . Not on file   Social History Narrative    Family History  Problem Relation Age of Onset  . Diabetes Mother   . Hypertension Father   . Diabetes Sister       Alvino Chapel, MD 02/15/2015, 9:22 AM

## 2015-02-15 NOTE — Addendum Note (Signed)
Addended by: Joylene John D on: 02/15/2015 02:45 PM   Modules accepted: Orders

## 2015-02-18 ENCOUNTER — Telehealth: Payer: Self-pay | Admitting: *Deleted

## 2015-02-18 LAB — URINE CULTURE

## 2015-02-18 NOTE — Telephone Encounter (Signed)
Per Joylene John, NP, patient notified of urine culture results. She is going to continue antibiotics prescribed by Dr. Fermin Schwab since she is already halfway finished with the course. No other questions or concerns noted at this time. Patient encouraged to continue pushing PO fluids and that she can try OTC Azo tablets or cranberry juice to see if that helps with the dysuria. Patient agreeable to this and instructed to call with any additional concerns.

## 2015-02-19 LAB — CYTOLOGY - PAP

## 2015-02-22 ENCOUNTER — Telehealth: Payer: Self-pay | Admitting: *Deleted

## 2015-02-22 NOTE — Telephone Encounter (Signed)
-----   Message from Dorothyann Gibbs, NP sent at 02/20/2015 11:07 AM EDT ----- Negative pap smear  ----- Message -----    From: [facility]    Sent: 02/19/2015   5:17 PM      To: Dorothyann Gibbs, NP

## 2015-02-22 NOTE — Telephone Encounter (Signed)
Notified patient of results as noted below by Joylene John, NP. No questions or concerns noted at this time.

## 2015-06-04 ENCOUNTER — Emergency Department (HOSPITAL_COMMUNITY)
Admission: EM | Admit: 2015-06-04 | Discharge: 2015-06-04 | Disposition: A | Discharge status: Home / Self Care | Payer: 59 | Attending: Emergency Medicine | Admitting: Emergency Medicine

## 2015-06-04 ENCOUNTER — Encounter (HOSPITAL_COMMUNITY): Payer: Self-pay | Admitting: *Deleted

## 2015-06-04 ENCOUNTER — Emergency Department (HOSPITAL_COMMUNITY): Payer: 59

## 2015-06-04 DIAGNOSIS — S59912A Unspecified injury of left forearm, initial encounter: Secondary | ICD-10-CM | POA: Diagnosis present

## 2015-06-04 DIAGNOSIS — Y92410 Unspecified street and highway as the place of occurrence of the external cause: Secondary | ICD-10-CM | POA: Insufficient documentation

## 2015-06-04 DIAGNOSIS — J45909 Unspecified asthma, uncomplicated: Secondary | ICD-10-CM | POA: Insufficient documentation

## 2015-06-04 DIAGNOSIS — Z87442 Personal history of urinary calculi: Secondary | ICD-10-CM | POA: Diagnosis not present

## 2015-06-04 DIAGNOSIS — Y998 Other external cause status: Secondary | ICD-10-CM | POA: Insufficient documentation

## 2015-06-04 DIAGNOSIS — Y9389 Activity, other specified: Secondary | ICD-10-CM | POA: Diagnosis not present

## 2015-06-04 DIAGNOSIS — S5012XA Contusion of left forearm, initial encounter: Secondary | ICD-10-CM | POA: Insufficient documentation

## 2015-06-04 DIAGNOSIS — F17210 Nicotine dependence, cigarettes, uncomplicated: Secondary | ICD-10-CM | POA: Diagnosis not present
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
## 2015-08-31 ENCOUNTER — Encounter (HOSPITAL_COMMUNITY): Payer: Self-pay

## 2015-08-31 ENCOUNTER — Emergency Department (HOSPITAL_COMMUNITY): Payer: Medicaid Other

## 2015-08-31 DIAGNOSIS — R0789 Other chest pain: Secondary | ICD-10-CM | POA: Diagnosis present

## 2015-08-31 DIAGNOSIS — Z8589 Personal history of malignant neoplasm of other organs and systems: Secondary | ICD-10-CM | POA: Diagnosis not present

## 2015-08-31 DIAGNOSIS — Z791 Long term (current) use of non-steroidal anti-inflammatories (NSAID): Secondary | ICD-10-CM | POA: Diagnosis not present

## 2015-08-31 DIAGNOSIS — F17210 Nicotine dependence, cigarettes, uncomplicated: Secondary | ICD-10-CM | POA: Insufficient documentation

## 2015-08-31 DIAGNOSIS — Z87442 Personal history of urinary calculi: Secondary | ICD-10-CM | POA: Insufficient documentation

## 2015-08-31 DIAGNOSIS — J45909 Unspecified asthma, uncomplicated: Secondary | ICD-10-CM | POA: Insufficient documentation

## 2015-08-31 NOTE — ED Notes (Signed)
Patient states that she started having chest pain on the left side today. Started in upper breast, radiates down left arm, and is in left shoulder. Constant pain, just spreading out.

## 2015-09-01 ENCOUNTER — Emergency Department (HOSPITAL_COMMUNITY)
Admission: EM | Admit: 2015-09-01 | Discharge: 2015-09-01 | Disposition: A | Discharge status: Home / Self Care | Payer: Medicaid Other | Attending: Emergency Medicine | Admitting: Emergency Medicine

## 2015-09-01 DIAGNOSIS — R0789 Other chest pain: Secondary | ICD-10-CM

## 2015-09-01 LAB — CBC
HEMATOCRIT: 39.6 % (ref 36.0–46.0)
Hemoglobin: 13.8 g/dL (ref 12.0–15.0)
MCH: 30.7 pg (ref 26.0–34.0)
MCHC: 34.8 g/dL (ref 30.0–36.0)
MCV: 88 fL (ref 78.0–100.0)
PLATELETS: 349 10*3/uL (ref 150–400)
RBC: 4.5 MIL/uL (ref 3.87–5.11)
RDW: 13.4 % (ref 11.5–15.5)
WBC: 11.4 10*3/uL — AB (ref 4.0–10.5)

## 2015-09-01 LAB — BASIC METABOLIC PANEL
Anion gap: 7 (ref 5–15)
BUN: 15 mg/dL (ref 6–20)
CHLORIDE: 106 mmol/L (ref 101–111)
CO2: 26 mmol/L (ref 22–32)
CREATININE: 0.9 mg/dL (ref 0.44–1.00)
Calcium: 8.4 mg/dL — ABNORMAL LOW (ref 8.9–10.3)
GFR calc Af Amer: >60 mL/min (ref >60)
GFR calc non Af Amer: >60 mL/min (ref >60)
GLUCOSE: 87 mg/dL (ref 65–99)
Potassium: 3.2 mmol/L — ABNORMAL LOW (ref 3.5–5.1)
SODIUM: 139 mmol/L (ref 135–145)

## 2015-09-01 LAB — TROPONIN I: Troponin I: <0.03 ng/mL (ref <0.031)

## 2015-09-01 MED ORDER — OXYCODONE-ACETAMINOPHEN 5-325 MG PO TABS: 1.0000 | ORAL_TABLET | ORAL | Status: DC | PRN

## 2015-09-01 MED ORDER — NAPROXEN 500 MG PO TABS: 500.0000 mg | ORAL_TABLET | Freq: Two times a day (BID) | ORAL | Status: DC

## 2015-09-01 MED ORDER — POTASSIUM CHLORIDE CRYS ER 10 MEQ PO TBCR
EXTENDED_RELEASE_TABLET | ORAL | Status: AC
  Filled 2015-09-01: qty 4

## 2015-09-01 MED ORDER — POTASSIUM CHLORIDE CRYS ER 20 MEQ PO TBCR
40.0000 meq | EXTENDED_RELEASE_TABLET | Freq: Once | ORAL | Status: AC
  Administered 2015-09-01: 40 meq via ORAL

## 2015-09-01 NOTE — ED Provider Notes (Signed)
CSN: [id number]     Arrival date & time 08/31/15  2315 History   First MD Initiated Contact with Patient 09/01/15 0040     Chief Complaint  Patient presents with  . Chest Pain     (Consider location/radiation/quality/duration/timing/severity/associated sxs/prior Treatment) Patient is a 40 y.o. female presenting with chest pain. The history is provided by the patient.  Chest Pain She has had left upper chest pain all day today. There's some radiation to the left shoulder and upper arm. Pain is sharp and she rates at 10/10. Is worse with deep breath and with moving her left arm. Nothing makes it better. She denies dyspnea, nausea, diaphoresis. She denies fever or chills. She denies any recent trauma. He took ibuprofen 600 mg about 3 hours ago with no relief. She did have a similar episode about 3 years ago, but she states that today's pain is worse.  Past Medical History  Diagnosis Date  . Asthma     recent bronchitis-rescue inhaler  . History of kidney stones   . Cervical cancer ([place]) 04/2011    Stage 1B squamous cell   Past Surgical History  Procedure Laterality Date  . Ectopic pregnancy surgery    . Essure tubal ligation  2010  . Leep    . Diagnostic laparoscopy  2007    ectopic  . Cervical conization w/bx  03/24/2012    Procedure: CONIZATION CERVIX WITH BIOPSY;  Surgeon: Woodroe Mode, MD;  Location: [facility];  Service: Gynecology;  Laterality: N/A;  . Insertion of suprapubic catheter  05/03/2012    Procedure: INSERTION OF SUPRAPUBIC CATHETER;  Surgeon: Alvino Chapel, MD;  Location: [facility];  Service: Gynecology;;  . Radical hysterectomy  05/03/2012    Procedure: RADICAL HYSTERECTOMY;  Surgeon: Alvino Chapel, MD;  Location: [facility];  Service: Gynecology;;  transposition of the ovaries  . Lymphadenectomy  05/03/2012    Procedure: LYMPHADENECTOMY;  Surgeon: Alvino Chapel, MD;  Location: [facility];  Service: Gynecology;  Laterality: Bilateral;  pelvic   .  Abdominal hysterectomy     Family History  Problem Relation Age of Onset  . Diabetes Mother   . Hypertension Father   . Diabetes Sister    Social History  Substance Use Topics  . Smoking status: Current Every Day Smoker -- 1.00 packs/day for 19 years    Types: Cigarettes  . Smokeless tobacco: Never Used     Comment: smoking cessation information given  . Alcohol Use: No   OB History    Gravida Para Term Preterm AB TAB SAB Ectopic Multiple Living   4 4 4       4      Review of Systems  Cardiovascular: Positive for chest pain.  All other systems reviewed and are negative.     Allergies  Review of patient's allergies indicates no known allergies.  Home Medications   Prior to Admission medications   Medication Sig Start Date End Date Taking? Authorizing Provider  cyclobenzaprine (FLEXERIL) 5 MG tablet Take 5 mg by mouth 3 (three) times daily as needed. 04/12/15   Historical Provider, MD  ibuprofen (ADVIL,MOTRIN) 800 MG tablet TAKE 1 TABLET BY MOUTH 3 TIMES A DAY (EVERY 8 HOURS) 04/12/15   Historical Provider, MD   BP 100/47 mmHg  Pulse 76  Temp(Src) 97.9 F (36.6 C) (Oral)  Resp 19  Ht 5\' 3"  (1.6 m)  Wt 230 lb (104.327 kg)  BMI 40.75 kg/m2  SpO2 100%  LMP 04/11/2012 Physical Exam  Nursing note and vitals reviewed.  40 year old female, resting comfortably and in no acute distress. Vital signs are normal. Oxygen saturation is 100%, which is normal. Head is normocephalic and atraumatic. PERRLA, EOMI. Oropharynx is clear. Neck is nontender and supple without adenopathy or JVD. Back is nontender and there is no CVA tenderness. Lungs are clear without rales, wheezes, or rhonchi. Chest moderately tender in the superior lateral aspect on the left and this does reproduce her pain. There is no crepitus. Heart has regular rate and rhythm without murmur. Abdomen is soft, flat, nontender without masses or hepatosplenomegaly and peristalsis is normoactive. Extremities have no  cyanosis or edema, full range of motion is present. Skin is warm and dry without rash. Neurologic: Mental status is normal, cranial nerves are intact, there are no motor or sensory deficits.  ED Course  Procedures (including critical care time) Labs Review Labs Reviewed  BASIC METABOLIC PANEL  CBC  TROPONIN I    Imaging Review Dg Chest 2 View  09/01/2015  CLINICAL DATA:  Patient with left-sided chest pain. Radiating to the left shoulder. EXAM: CHEST  2 VIEW COMPARISON:  Chest radiograph 05/13/2013. FINDINGS: The heart size and mediastinal contours are within normal limits. Both lungs are clear. The visualized skeletal structures are unremarkable. IMPRESSION: No active cardiopulmonary disease. Electronically Signed   By: Lovey Newcomer M.D.   On: 09/01/2015 00:09   I have personally reviewed and evaluated these images and lab results as part of my medical decision-making.   EKG Interpretation   Date/Time:  Sunday September 01 2015 00:17:30 EST Ventricular Rate:  76 PR Interval:  134 QRS Duration: 98 QT Interval:  388 QTC Calculation: 436 R Axis:   87 Text Interpretation:  Sinus rhythm RSR' in V1 or V2, right VCD or RVH  Baseline wander in lead(s) V6 When compared with ECG of 05/13/2013, No  significant change was found Confirmed by [facility]  MD, Bryna Razavi ([id number]) on  09/01/2015 12:43:01 AM      MDM   Final diagnoses:  Chest wall pain    Chest wall pain. Old records are reviewed and she has a prior ED visit on 05/13/2013 for chest wall pain. ECG is normal and chest x-ray is normal. Labs are pending at this point but anticipate treatment with NSAIDs and small number of narcotic analgesic tablets. I have reviewed her record on the [place] controlled substance reporting website and she has no narcotic prescriptions in the last 6 months.   Workup shows mild hypokalemia but otherwise unremarkable. She's given a dose of potassium in the ED and is discharged with prescriptions for  naproxen and oxycodone-acetaminophen.   Delora Fuel, MD [id number] [phone]

## 2015-09-01 NOTE — Discharge Instructions (Signed)
Chest Wall Pain Chest wall pain is pain in or around the bones and muscles of your chest. Sometimes, an injury causes this pain. Sometimes, the cause may not be known. This pain may take several weeks or longer to get better. HOME CARE INSTRUCTIONS  Pay attention to any changes in your symptoms. Take these actions to help with your pain:   Rest as told by your health care provider.   Avoid activities that cause pain. These include any activities that use your chest muscles or your abdominal and side muscles to lift heavy items.   If directed, apply ice to the painful area:  Put ice in a plastic bag.  Place a towel between your skin and the bag.  Leave the ice on for 20 minutes, 2-3 times per day.  Take over-the-counter and prescription medicines only as told by your health care provider.  Do not use tobacco products, including cigarettes, chewing tobacco, and e-cigarettes. If you need help quitting, ask your health care provider.  Keep all follow-up visits as told by your health care provider. This is important. SEEK MEDICAL CARE IF:  You have a fever.  Your chest pain becomes worse.  You have new symptoms. SEEK IMMEDIATE MEDICAL CARE IF:  You have nausea or vomiting.  You feel sweaty or light-headed.  You have a cough with phlegm (sputum) or you cough up blood.  You develop shortness of breath.   This information is not intended to replace advice given to you by your health care provider. Make sure you discuss any questions you have with your health care provider.   Document Released: 06/22/2005 Document Revised: 03/13/2015 Document Reviewed: 09/17/2014 Elsevier Interactive Patient Education 2016 [employer]  Naproxen and naproxen sodium oral immediate-release tablets What is this medicine? NAPROXEN (na PROX en) is a non-steroidal anti-inflammatory drug (NSAID). It is used to reduce swelling and to treat pain. This medicine may be used for dental pain, headache,  or painful monthly periods. It is also used for painful joint and muscular problems such as arthritis, tendinitis, bursitis, and gout. This medicine may be used for other purposes; ask your health care provider or pharmacist if you have questions. What should I tell my health care provider before I take this medicine? They need to know if you have any of these conditions: -asthma -cigarette smoker -drink more than 3 alcohol containing drinks a day -heart disease or circulation problems such as heart failure or leg edema (fluid retention) -high blood pressure -kidney disease -liver disease -stomach bleeding or ulcers -an unusual or allergic reaction to naproxen, aspirin, other NSAIDs, other medicines, foods, dyes, or preservatives -pregnant or trying to get pregnant -breast-feeding How should I use this medicine? Take this medicine by mouth with a glass of water. Follow the directions on the prescription label. Take it with food if your stomach gets upset. Try to not lie down for at least 10 minutes after you take it. Take your medicine at regular intervals. Do not take your medicine more often than directed. Long-term, continuous use may increase the risk of heart attack or stroke. A special MedGuide will be given to you by the pharmacist with each prescription and refill. Be sure to read this information carefully each time. Talk to your pediatrician regarding the use of this medicine in children. Special care may be needed. Overdosage: If you think you have taken too much of this medicine contact a poison control center or emergency room at once. NOTE: This medicine is  only for you. Do not share this medicine with others. What if I miss a dose? If you miss a dose, take it as soon as you can. If it is almost time for your next dose, take only that dose. Do not take double or extra doses. What may interact with this  medicine? -alcohol -aspirin -cidofovir -diuretics -lithium -methotrexate -other drugs for inflammation like ketorolac or prednisone -pemetrexed -probenecid -warfarin This list may not describe all possible interactions. Give your health care provider a list of all the medicines, herbs, non-prescription drugs, or dietary supplements you use. Also tell them if you smoke, drink alcohol, or use illegal drugs. Some items may interact with your medicine. What should I watch for while using this medicine? Tell your doctor or health care professional if your pain does not get better. Talk to your doctor before taking another medicine for pain. Do not treat yourself. This medicine does not prevent heart attack or stroke. In fact, this medicine may increase the chance of a heart attack or stroke. The chance may increase with longer use of this medicine and in people who have heart disease. If you take aspirin to prevent heart attack or stroke, talk with your doctor or health care professional. Do not take other medicines that contain aspirin, ibuprofen, or naproxen with this medicine. Side effects such as stomach upset, nausea, or ulcers may be more likely to occur. Many medicines available without a prescription should not be taken with this medicine. This medicine can cause ulcers and bleeding in the stomach and intestines at any time during treatment. Do not smoke cigarettes or drink alcohol. These increase irritation to your stomach and can make it more susceptible to damage from this medicine. Ulcers and bleeding can happen without warning symptoms and can cause death. You may get drowsy or dizzy. Do not drive, use machinery, or do anything that needs mental alertness until you know how this medicine affects you. Do not stand or sit up quickly, especially if you are an older patient. This reduces the risk of dizzy or fainting spells. This medicine can cause you to bleed more easily. Try to avoid damage  to your teeth and gums when you brush or floss your teeth. What side effects may I notice from receiving this medicine? Side effects that you should report to your doctor or health care professional as soon as possible: -black or bloody stools, blood in the urine or vomit -blurred vision -chest pain -difficulty breathing or wheezing -nausea or vomiting -severe stomach pain -skin rash, skin redness, blistering or peeling skin, hives, or itching -slurred speech or weakness on one side of the body -swelling of eyelids, throat, lips -unexplained weight gain or swelling -unusually weak or tired -yellowing of eyes or skin Side effects that usually do not require medical attention (report to your doctor or health care professional if they continue or are bothersome): -constipation -headache -heartburn This list may not describe all possible side effects. Call your doctor for medical advice about side effects. You may report side effects to FDA at 1-800-FDA-1088. Where should I keep my medicine? Keep out of the reach of children. Store at room temperature between 15 and 30 degrees C (59 and 86 degrees F). Keep container tightly closed. Throw away any unused medicine after the expiration date. NOTE: This sheet is a summary. It may not cover all possible information. If you have questions about this medicine, talk to your doctor, pharmacist, or health care provider.  2016, Elsevier/Gold Standard. (2009-06-24 20:10:16)  Acetaminophen; Oxycodone tablets What is this medicine? ACETAMINOPHEN; OXYCODONE (a set a MEE noe fen; ox i KOE done) is a pain reliever. It is used to treat moderate to severe pain. This medicine may be used for other purposes; ask your health care provider or pharmacist if you have questions. What should I tell my health care provider before I take this medicine? They need to know if you have any of these conditions: -brain tumor -Crohn's disease, inflammatory bowel  disease, or ulcerative colitis -drug abuse or addiction -head injury -heart or circulation problems -if you often drink alcohol -kidney disease or problems going to the bathroom -liver disease -lung disease, asthma, or breathing problems -an unusual or allergic reaction to acetaminophen, oxycodone, other opioid analgesics, other medicines, foods, dyes, or preservatives -pregnant or trying to get pregnant -breast-feeding How should I use this medicine? Take this medicine by mouth with a full glass of water. Follow the directions on the prescription label. You can take it with or without food. If it upsets your stomach, take it with food. Take your medicine at regular intervals. Do not take it more often than directed. Talk to your pediatrician regarding the use of this medicine in children. Special care may be needed. Patients over 33 years old may have a stronger reaction and need a smaller dose. Overdosage: If you think you have taken too much of this medicine contact a poison control center or emergency room at once. NOTE: This medicine is only for you. Do not share this medicine with others. What if I miss a dose? If you miss a dose, take it as soon as you can. If it is almost time for your next dose, take only that dose. Do not take double or extra doses. What may interact with this medicine? -alcohol -antihistamines -barbiturates like amobarbital, butalbital, butabarbital, methohexital, pentobarbital, phenobarbital, thiopental, and secobarbital -benztropine -drugs for bladder problems like solifenacin, trospium, oxybutynin, tolterodine, hyoscyamine, and methscopolamine -drugs for breathing problems like ipratropium and tiotropium -drugs for certain stomach or intestine problems like propantheline, homatropine methylbromide, glycopyrrolate, atropine, belladonna, and dicyclomine -general anesthetics like etomidate, ketamine, nitrous oxide, propofol, desflurane, enflurane, halothane,  isoflurane, and sevoflurane -medicines for depression, anxiety, or psychotic disturbances -medicines for sleep -muscle relaxants -naltrexone -narcotic medicines (opiates) for pain -phenothiazines like perphenazine, thioridazine, chlorpromazine, mesoridazine, fluphenazine, prochlorperazine, promazine, and trifluoperazine -scopolamine -tramadol -trihexyphenidyl This list may not describe all possible interactions. Give your health care provider a list of all the medicines, herbs, non-prescription drugs, or dietary supplements you use. Also tell them if you smoke, drink alcohol, or use illegal drugs. Some items may interact with your medicine. What should I watch for while using this medicine? Tell your doctor or health care professional if your pain does not go away, if it gets worse, or if you have new or a different type of pain. You may develop tolerance to the medicine. Tolerance means that you will need a higher dose of the medication for pain relief. Tolerance is normal and is expected if you take this medicine for a long time. Do not suddenly stop taking your medicine because you may develop a severe reaction. Your body becomes used to the medicine. This does NOT mean you are addicted. Addiction is a behavior related to getting and using a drug for a non-medical reason. If you have pain, you have a medical reason to take pain medicine. Your doctor will tell you how much medicine to take. If your  doctor wants you to stop the medicine, the dose will be slowly lowered over time to avoid any side effects. You may get drowsy or dizzy. Do not drive, use machinery, or do anything that needs mental alertness until you know how this medicine affects you. Do not stand or sit up quickly, especially if you are an older patient. This reduces the risk of dizzy or fainting spells. Alcohol may interfere with the effect of this medicine. Avoid alcoholic drinks. There are different types of narcotic medicines  (opiates) for pain. If you take more than one type at the same time, you may have more side effects. Give your health care provider a list of all medicines you use. Your doctor will tell you how much medicine to take. Do not take more medicine than directed. Call emergency for help if you have problems breathing. The medicine will cause constipation. Try to have a bowel movement at least every 2 to 3 days. If you do not have a bowel movement for 3 days, call your doctor or health care professional. Do not take Tylenol (acetaminophen) or medicines that have acetaminophen with this medicine. Too much acetaminophen can be very dangerous. Many nonprescription medicines contain acetaminophen. Always read the labels carefully to avoid taking more acetaminophen. What side effects may I notice from receiving this medicine? Side effects that you should report to your doctor or health care professional as soon as possible: -allergic reactions like skin rash, itching or hives, swelling of the face, lips, or tongue -breathing difficulties, wheezing -confusion -light headedness or fainting spells -severe stomach pain -unusually weak or tired -yellowing of the skin or the whites of the eyes Side effects that usually do not require medical attention (report to your doctor or health care professional if they continue or are bothersome): -dizziness -drowsiness -nausea -vomiting This list may not describe all possible side effects. Call your doctor for medical advice about side effects. You may report side effects to FDA at 1-800-FDA-1088. Where should I keep my medicine? Keep out of the reach of children. This medicine can be abused. Keep your medicine in a safe place to protect it from theft. Do not share this medicine with anyone. Selling or giving away this medicine is dangerous and against the law. This medicine may cause accidental overdose and death if it taken by other adults, children, or pets. Mix any  unused medicine with a substance like cat litter or coffee grounds. Then throw the medicine away in a sealed container like a sealed bag or a coffee can with a lid. Do not use the medicine after the expiration date. Store at room temperature between 20 and 25 degrees C (68 and 77 degrees F). NOTE: This sheet is a summary. It may not cover all possible information. If you have questions about this medicine, talk to your doctor, pharmacist, or health care provider.    2016, Elsevier/Gold Standard. (2014-05-23 15:18:46)

## 2015-09-01 NOTE — ED Notes (Signed)
Pt states understanding of care given and follow up instructions.  Instructed to not drink ETOH or drive while taking disp medications.  Ambulated from ED

## 2015-09-06 MED FILL — Oxycodone w/ Acetaminophen Tab 5-325 MG: ORAL | Qty: 6 | Status: AC

## 2015-09-14 ENCOUNTER — Emergency Department (HOSPITAL_COMMUNITY): Payer: Medicaid Other

## 2015-09-14 ENCOUNTER — Encounter (HOSPITAL_COMMUNITY): Payer: Self-pay | Admitting: Emergency Medicine

## 2015-09-14 ENCOUNTER — Emergency Department (HOSPITAL_COMMUNITY)
Admission: EM | Admit: 2015-09-14 | Discharge: 2015-09-14 | Disposition: A | Discharge status: Home / Self Care | Payer: Medicaid Other | Attending: Emergency Medicine | Admitting: Emergency Medicine

## 2015-09-14 DIAGNOSIS — Z8589 Personal history of malignant neoplasm of other organs and systems: Secondary | ICD-10-CM | POA: Diagnosis not present

## 2015-09-14 DIAGNOSIS — S59912A Unspecified injury of left forearm, initial encounter: Secondary | ICD-10-CM | POA: Diagnosis present

## 2015-09-14 DIAGNOSIS — Y999 Unspecified external cause status: Secondary | ICD-10-CM | POA: Insufficient documentation

## 2015-09-14 DIAGNOSIS — S5012XA Contusion of left forearm, initial encounter: Secondary | ICD-10-CM | POA: Diagnosis not present

## 2015-09-14 DIAGNOSIS — Y939 Activity, unspecified: Secondary | ICD-10-CM | POA: Diagnosis not present

## 2015-09-14 DIAGNOSIS — W010XXA Fall on same level from slipping, tripping and stumbling without subsequent striking against object, initial encounter: Secondary | ICD-10-CM | POA: Insufficient documentation

## 2015-09-14 DIAGNOSIS — J45909 Unspecified asthma, uncomplicated: Secondary | ICD-10-CM | POA: Diagnosis not present

## 2015-09-14 DIAGNOSIS — Y929 Unspecified place or not applicable: Secondary | ICD-10-CM | POA: Diagnosis not present

## 2015-09-14 DIAGNOSIS — F17210 Nicotine dependence, cigarettes, uncomplicated: Secondary | ICD-10-CM | POA: Diagnosis not present

## 2015-09-14 MED ORDER — NAPROXEN 500 MG PO TABS: 500.0000 mg | ORAL_TABLET | Freq: Two times a day (BID) | ORAL | Status: DC

## 2015-09-14 NOTE — Discharge Instructions (Signed)

## 2015-09-14 NOTE — ED Notes (Signed)
Patient states she fell this morning landing on her left arm. Bruising noted to left forearm at triage. Denies head injury or LOC.

## 2015-09-14 NOTE — ED Provider Notes (Signed)
CSN: [id number]     Arrival date & time 09/14/15  [id number] History   First MD Initiated Contact with Patient 09/14/15 0827     Chief Complaint  Patient presents with  . Fall  . Arm Injury     (Consider location/radiation/quality/duration/timing/severity/associated sxs/prior Treatment) The history is provided by the patient.   April Ayala is a 40 y.o. right handed female presenting with pain and bruising with a knot forming on her left medial forearm since slipping and falling on vomit in the bathroom this am (reports daughter is sick).  She landed on her outstretched left forearm.  She denies hitting her head, denies other injury or pain and has no weakness in her hand or wrist.  She does endorse a tingling sensation along her the ulner side of her distal forearm ending in her 5th finger.  She has had no treatment prior to arrival.    Past Medical History  Diagnosis Date  . Asthma     recent bronchitis-rescue inhaler  . History of kidney stones   . Cervical cancer ([place]) 04/2011    Stage 1B squamous cell   Past Surgical History  Procedure Laterality Date  . Ectopic pregnancy surgery    . Essure tubal ligation  2010  . Leep    . Diagnostic laparoscopy  2007    ectopic  . Cervical conization w/bx  03/24/2012    Procedure: CONIZATION CERVIX WITH BIOPSY;  Surgeon: Woodroe Mode, MD;  Location: [facility];  Service: Gynecology;  Laterality: N/A;  . Insertion of suprapubic catheter  05/03/2012    Procedure: INSERTION OF SUPRAPUBIC CATHETER;  Surgeon: Alvino Chapel, MD;  Location: [facility];  Service: Gynecology;;  . Radical hysterectomy  05/03/2012    Procedure: RADICAL HYSTERECTOMY;  Surgeon: Alvino Chapel, MD;  Location: [facility];  Service: Gynecology;;  transposition of the ovaries  . Lymphadenectomy  05/03/2012    Procedure: LYMPHADENECTOMY;  Surgeon: Alvino Chapel, MD;  Location: [facility];  Service: Gynecology;  Laterality: Bilateral;  pelvic   .  Abdominal hysterectomy     Family History  Problem Relation Age of Onset  . Diabetes Mother   . Hypertension Father   . Diabetes Sister    Social History  Substance Use Topics  . Smoking status: Current Every Day Smoker -- 1.00 packs/day for 19 years    Types: Cigarettes  . Smokeless tobacco: Never Used     Comment: smoking cessation information given  . Alcohol Use: No   OB History    Gravida Para Term Preterm AB TAB SAB Ectopic Multiple Living   4 4 4       4      Review of Systems  Constitutional: Negative for fever.  Musculoskeletal: Positive for arthralgias. Negative for myalgias and joint swelling.  Skin: Positive for color change. Negative for wound.  Neurological: Negative for weakness and numbness.      Allergies  Review of patient's allergies indicates no known allergies.  Home Medications   Prior to Admission medications   Medication Sig Start Date End Date Taking? Authorizing Provider  cyclobenzaprine (FLEXERIL) 5 MG tablet Take 5 mg by mouth 3 (three) times daily as needed. 04/12/15   Historical Provider, MD  naproxen (NAPROSYN) 500 MG tablet Take 1 tablet (500 mg total) by mouth 2 (two) times daily. 09/14/15   Evalee Jefferson, PA-C  oxyCODONE-acetaminophen (PERCOCET) 5-325 MG tablet Take 1 tablet by mouth every 4 (four) hours as needed for moderate pain.  [id number]   Delora Fuel, MD  oxyCODONE-acetaminophen (PERCOCET) 5-325 MG tablet Take 1 tablet by mouth every 4 (four) hours as needed for moderate pain. [id number]   Delora Fuel, MD   BP [id number] mmHg  Pulse 91  Temp(Src) 98.2 F (36.8 C) (Oral)  Resp 18  Ht 5\' 3"  (1.6 m)  Wt 96.616 kg  BMI 37.74 kg/m2  SpO2 100%  LMP 04/11/2012 Physical Exam  Constitutional: She appears well-developed and well-nourished.  HENT:  Head: Normocephalic and atraumatic.  Neck: Normal range of motion.  Cardiovascular:  Pulses equal bilaterally  Musculoskeletal: She exhibits tenderness.       Left forearm: She exhibits tenderness,  swelling and edema. She exhibits no bony tenderness, no deformity and no laceration.  ttp with early ecchymosis and localized hematoma left mid forearm, ulnar side.  Equal grip strength, no ttp fingers, hand, wrist or upper arm and shoulder.  c spine nontender.    Neurological: She is alert. She has normal strength. She displays normal reflexes. No sensory deficit.  Skin: Skin is warm and dry.  Psychiatric: She has a normal mood and affect.    ED Course  Procedures (including critical care time) Labs Review Labs Reviewed - No data to display  Imaging Review Dg Forearm Left  09/14/2015  CLINICAL DATA:  Pain after fall. EXAM: LEFT FOREARM - 2 VIEW COMPARISON:  None. FINDINGS: There is no evidence of fracture or other focal bone lesions. Soft tissues are unremarkable. IMPRESSION: Negative. Electronically Signed   By: Dorise Bullion III M.D   On: 09/14/2015 09:18   I have personally reviewed and evaluated these images and lab results as part of my medical decision-making.   EKG Interpretation None      MDM   Final diagnoses:  Contusion of forearm, left, initial encounter    Pt with left forearm contusion.  Imaging reviewed.  Jones dressing,  RICE, ibuprofen.  PRN f/u for any persistent or worsened sx.    Evalee Jefferson, PA-C 09/14/15 0930  Julianne Rice, MD 09/15/15 1149

## 2015-12-23 ENCOUNTER — Emergency Department (HOSPITAL_COMMUNITY)
Admission: EM | Admit: 2015-12-23 | Discharge: 2015-12-23 | Disposition: A | Discharge status: Home / Self Care | Payer: No Typology Code available for payment source | Attending: Emergency Medicine | Admitting: Emergency Medicine

## 2015-12-23 ENCOUNTER — Encounter (HOSPITAL_COMMUNITY): Payer: Self-pay | Admitting: Emergency Medicine

## 2015-12-23 DIAGNOSIS — H6692 Otitis media, unspecified, left ear: Secondary | ICD-10-CM | POA: Insufficient documentation

## 2015-12-23 DIAGNOSIS — F17210 Nicotine dependence, cigarettes, uncomplicated: Secondary | ICD-10-CM | POA: Insufficient documentation

## 2015-12-23 DIAGNOSIS — J45909 Unspecified asthma, uncomplicated: Secondary | ICD-10-CM | POA: Insufficient documentation

## 2015-12-23 MED ORDER — AMOXICILLIN 500 MG PO CAPS: 500.0000 mg | ORAL_CAPSULE | Freq: Three times a day (TID) | ORAL | Status: DC

## 2015-12-23 NOTE — ED Notes (Signed)
April Ayala at bedside  

## 2015-12-23 NOTE — ED Notes (Signed)
Patient complaining of body aches, facial pain, and cough since yesterday.

## 2015-12-23 NOTE — Discharge Instructions (Signed)
Otitis Media, Adult Otitis media is redness, soreness, and inflammation of the middle ear. Otitis media may be caused by allergies or, most commonly, by infection. Often it occurs as a complication of the common cold. SIGNS AND SYMPTOMS Symptoms of otitis media may include:  Earache.  Fever.  Ringing in your ear.  Headache.  Leakage of fluid from the ear. DIAGNOSIS To diagnose otitis media, your health care provider will examine your ear with an otoscope. This is an instrument that allows your health care provider to see into your ear in order to examine your eardrum. Your health care provider also will ask you questions about your symptoms. TREATMENT  Typically, otitis media resolves on its own within 3-5 days. Your health care provider may prescribe medicine to ease your symptoms of pain. If otitis media does not resolve within 5 days or is recurrent, your health care provider may prescribe antibiotic medicines if he or she suspects that a bacterial infection is the cause. HOME CARE INSTRUCTIONS   If you were prescribed an antibiotic medicine, finish it all even if you start to feel better.  Take medicines only as directed by your health care provider.  Keep all follow-up visits as directed by your health care provider. SEEK MEDICAL CARE IF:  You have otitis media only in one ear, or bleeding from your nose, or both.  You notice a lump on your neck.  You are not getting better in 3-5 days.  You feel worse instead of better. SEEK IMMEDIATE MEDICAL CARE IF:   You have pain that is not controlled with medicine.  You have swelling, redness, or pain around your ear or stiffness in your neck.  You notice that part of your face is paralyzed.  You notice that the bone behind your ear (mastoid) is tender when you touch it. MAKE SURE YOU:   Understand these instructions.  Will watch your condition.  Will get help right away if you are not doing well or get worse.   This  information is not intended to replace advice given to you by your health care provider. Make sure you discuss any questions you have with your health care provider.   Document Released: 03/27/2004 Document Revised: 07/13/2014 Document Reviewed: 01/17/2013 Elsevier Interactive Patient Education 2016 Else[employer] Sinusitis, Adult Sinusitis is redness, soreness, and inflammation of the paranasal sinuses. Paranasal sinuses are air pockets within the bones of your face. They are located beneath your eyes, in the middle of your forehead, and above your eyes. In healthy paranasal sinuses, mucus is able to drain out, and air is able to circulate through them by way of your nose. However, when your paranasal sinuses are inflamed, mucus and air can become trapped. This can allow bacteria and other germs to grow and cause infection. Sinusitis can develop quickly and last only a short time (acute) or continue over a long period (chronic). Sinusitis that lasts for more than 12 weeks is considered chronic. CAUSES Causes of sinusitis include:  Allergies.  Structural abnormalities, such as displacement of the cartilage that separates your nostrils (deviated septum), which can decrease the air flow through your nose and sinuses and affect sinus drainage.  Functional abnormalities, such as when the small hairs (cilia) that line your sinuses and help remove mucus do not work properly or are not present. SIGNS AND SYMPTOMS Symptoms of acute and chronic sinusitis are the same. The primary symptoms are pain and pressure around the affected sinuses. Other symptoms include:  Upper  toothache.  Earache.  Headache.  Bad breath.  Decreased sense of smell and taste.  A cough, which worsens when you are lying flat.  Fatigue.  Fever.  Thick drainage from your nose, which often is green and may contain pus (purulent).  Swelling and warmth over the affected sinuses. DIAGNOSIS Your health care provider will  perform a physical exam. During your exam, your health care provider may perform any of the following to help determine if you have acute sinusitis or chronic sinusitis:  Look in your nose for signs of abnormal growths in your nostrils (nasal polyps).  Tap over the affected sinus to check for signs of infection.  View the inside of your sinuses using an imaging device that has a light attached (endoscope). If your health care provider suspects that you have chronic sinusitis, one or more of the following tests may be recommended:  Allergy tests.  Nasal culture. A sample of mucus is taken from your nose, sent to a lab, and screened for bacteria.  Nasal cytology. A sample of mucus is taken from your nose and examined by your health care provider to determine if your sinusitis is related to an allergy. TREATMENT Most cases of acute sinusitis are related to a viral infection and will resolve on their own within 10 days. Sometimes, medicines are prescribed to help relieve symptoms of both acute and chronic sinusitis. These may include pain medicines, decongestants, nasal steroid sprays, or saline sprays. However, for sinusitis related to a bacterial infection, your health care provider will prescribe antibiotic medicines. These are medicines that will help kill the bacteria causing the infection. Rarely, sinusitis is caused by a fungal infection. In these cases, your health care provider will prescribe antifungal medicine. For some cases of chronic sinusitis, surgery is needed. Generally, these are cases in which sinusitis recurs more than 3 times per year, despite other treatments. HOME CARE INSTRUCTIONS  Drink plenty of water. Water helps thin the mucus so your sinuses can drain more easily.  Use a humidifier.  Inhale steam 3-4 times a day (for example, sit in the bathroom with the shower running).  Apply a warm, moist washcloth to your face 3-4 times a day, or as directed by your health care  provider.  Use saline nasal sprays to help moisten and clean your sinuses.  Take medicines only as directed by your health care provider.  If you were prescribed either an antibiotic or antifungal medicine, finish it all even if you start to feel better. SEEK IMMEDIATE MEDICAL CARE IF:  You have increasing pain or severe headaches.  You have nausea, vomiting, or drowsiness.  You have swelling around your face.  You have vision problems.  You have a stiff neck.  You have difficulty breathing.   This information is not intended to replace advice given to you by your health care provider. Make sure you discuss any questions you have with your health care provider.   Document Released: 06/22/2005 Document Revised: 07/13/2014 Document Reviewed: 07/07/2011 Elsevier Interactive Patient Education [employer].

## 2015-12-23 NOTE — ED Provider Notes (Signed)
CSN: [id number]     Arrival date & time 12/23/15  1547 History  By signing my name below, I, Nicole Kindred, attest that this documentation has been prepared under the direction and in the presence of Fransico Meadow, PA-C.  Electronically Signed: Nicole Kindred, ED Scribe 12/23/2015 at 4:11 PM.  Chief Complaint  Patient presents with  . Facial Pain  . Generalized Body Aches  . Cough    The history is provided by the patient. No language interpreter was used.   HPI Comments: April Ayala is a 40 y.o. female with PMHx of asthma who presents to the Emergency Department complaining of gradual onset, sinus pain and pressure, ongoing since yesterday. Pt reports associated fever with tmax of 100 degrees, chills, generalized body aches, cough, and mild ear pain. No other associated symptoms noted. Pt has tried benadryl with no relief to symptoms. No other worsening or alleviating factors noted. Pt denies sore throat, or any other pertinent symptoms.  Past Medical History  Diagnosis Date  . Asthma     recent bronchitis-rescue inhaler  . History of kidney stones   . 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 Ectopic pregnancy surgery    . Essure tubal ligation  2010  . Leep    . Diagnostic laparoscopy  2007    ectopic  . Cervical conization w/bx  03/24/2012    Procedure: CONIZATION CERVIX WITH BIOPSY;  Surgeon: Woodroe Mode, MD;  Location: [facility];  Service: Gynecology;  Laterality: N/A;  . Insertion of suprapubic catheter  05/03/2012    Procedure: INSERTION OF SUPRAPUBIC CATHETER;  Surgeon: Alvino Chapel, MD;  Location: [facility];  Service: Gynecology;;  . Radical hysterectomy  05/03/2012    Procedure: RADICAL HYSTERECTOMY;  Surgeon: Alvino Chapel, MD;  Location: [facility];  Service: Gynecology;;  transposition of the ovaries  . Lymphadenectomy  05/03/2012    Procedure: LYMPHADENECTOMY;  Surgeon:  Alvino Chapel, MD;  Location: [facility];  Service: Gynecology;  Laterality: Bilateral;  pelvic   . Abdominal hysterectomy     Family History  Problem Relation Age of Onset  . Diabetes Mother   . Hypertension Father   . Diabetes Sister    Social History  Substance Use Topics  . Smoking status: Current Every Day Smoker -- 1.00 packs/day for 19 years    Types: Cigarettes  . Smokeless tobacco: Never Used     Comment: smoking cessation information given  . Alcohol Use: No   OB History    Gravida Para Term Preterm AB TAB SAB Ectopic Multiple Living   4 4 4       4      Review of Systems  Constitutional: Positive for fever and chills.  HENT: Positive for ear pain and sinus pressure.   Respiratory: Positive for cough.   Musculoskeletal: Positive for myalgias.  All other systems reviewed and are negative.    Allergies  Review of patient's allergies indicates no known allergies.  Home Medications   Prior to Admission medications   Medication Sig Start Date End Date Taking? Authorizing Provider  cyclobenzaprine (FLEXERIL) 5 MG tablet Take 5 mg by mouth 3 (three) times daily as needed. 04/12/15   Historical Provider, MD  naproxen (NAPROSYN) 500 MG tablet Take 1 tablet (500 mg total) by mouth 2 (two) times daily. 09/14/15   Evalee Jefferson, PA-C  oxyCODONE-acetaminophen (PERCOCET) 5-325 MG tablet Take  1 tablet by mouth every 4 (four) hours as needed for moderate pain. [id number]   Delora Fuel, MD  oxyCODONE-acetaminophen (PERCOCET) 5-325 MG tablet Take 1 tablet by mouth every 4 (four) hours as needed for moderate pain. [id number]   Delora Fuel, MD   BP 123XX123 mmHg  Pulse 98  Temp(Src) 98.5 F (36.9 C)  Resp 17  Ht 5\' 3"  (1.6 m)  Wt 225 lb (102.059 kg)  BMI 39.87 kg/m2  SpO2 98%  LMP 04/11/2012 Physical Exam  Constitutional: She appears well-developed and well-nourished. No distress.  HENT:  Head: Normocephalic and atraumatic.  Right Ear: Tympanic membrane normal.  Left Ear:  Tympanic membrane is erythematous and bulging.  Nose: Right sinus exhibits no maxillary sinus tenderness and no frontal sinus tenderness. Left sinus exhibits maxillary sinus tenderness and frontal sinus tenderness.  Mouth/Throat: Posterior oropharyngeal erythema present.  Left TM bulging and erythematous. Oropharynx erythematous. Left maxillary and left frontal sinus tenderness.  Eyes: Conjunctivae and EOM are normal.  Neck: Neck supple. No tracheal deviation present.  Cardiovascular: Normal rate.   Pulmonary/Chest: Effort normal. No respiratory distress.  Musculoskeletal: Normal range of motion.  Neurological: She is alert.  Skin: Skin is warm and dry.  Psychiatric: She has a normal mood and affect. Her behavior is normal.  Nursing note and vitals reviewed.   ED Course  Procedures (including critical care time) DIAGNOSTIC STUDIES: Oxygen Saturation is 98% on RA, normal by my interpretation.    COORDINATION OF CARE: 4:33 PM Discussed treatment plan which includes amoxicillin and symptom monitoring with pt at bedside and pt agreed to plan.  Labs Review Labs Reviewed - No data to display  Imaging Review No results found.   EKG Interpretation None      MDM   Final diagnoses:  Left otitis media, recurrence not specified, unspecified chronicity, unspecified otitis media type    Meds ordered this encounter  Medications  . amoxicillin (AMOXIL) 500 MG capsule    Sig: Take 1 capsule (500 mg total) by mouth 3 (three) times daily.    Dispense:  30 capsule    Refill:  0    Order Specific Question:  Supervising Provider    Answer:  Noemi Chapel [[id number]]  An After Visit Summary was printed and given to the patient.   Hollace Kinnier [place], PA-C 12/23/15 [place], MD 12/23/15 [phone]

## 2016-04-08 ENCOUNTER — Emergency Department (HOSPITAL_COMMUNITY)
Admission: EM | Admit: 2016-04-08 | Discharge: 2016-04-08 | Disposition: A | Discharge status: Home / Self Care | Payer: 59 | Attending: Emergency Medicine | Admitting: Emergency Medicine

## 2016-04-08 ENCOUNTER — Encounter (HOSPITAL_COMMUNITY): Payer: Self-pay | Admitting: Emergency Medicine

## 2016-04-08 DIAGNOSIS — F17210 Nicotine dependence, cigarettes, uncomplicated: Secondary | ICD-10-CM | POA: Insufficient documentation

## 2016-04-08 DIAGNOSIS — M545 Low back pain: Secondary | ICD-10-CM | POA: Insufficient documentation

## 2016-04-08 DIAGNOSIS — R51 Headache: Secondary | ICD-10-CM | POA: Insufficient documentation

## 2016-04-08 DIAGNOSIS — R519 Headache, unspecified: Secondary | ICD-10-CM

## 2016-04-08 DIAGNOSIS — J45909 Unspecified asthma, uncomplicated: Secondary | ICD-10-CM | POA: Insufficient documentation

## 2016-04-08 MED ORDER — ONDANSETRON HCL 8 MG PO TABS: 8.0000 mg | ORAL_TABLET | ORAL | 0 refills | Status: DC | PRN

## 2016-04-08 MED ORDER — METOCLOPRAMIDE HCL 5 MG/ML IJ SOLN
10.0000 mg | Freq: Once | INTRAMUSCULAR | Status: AC
  Administered 2016-04-08: 10 mg via INTRAVENOUS
  Filled 2016-04-08: qty 2

## 2016-04-08 MED ORDER — KETOROLAC TROMETHAMINE 30 MG/ML IJ SOLN
30.0000 mg | Freq: Once | INTRAMUSCULAR | Status: AC
  Administered 2016-04-08: 30 mg via INTRAVENOUS
  Filled 2016-04-08: qty 1

## 2016-04-08 MED ORDER — DIPHENHYDRAMINE HCL 50 MG/ML IJ SOLN
25.0000 mg | Freq: Once | INTRAMUSCULAR | Status: AC
  Administered 2016-04-08: 25 mg via INTRAVENOUS
  Filled 2016-04-08: qty 1

## 2016-04-08 MED ORDER — SODIUM CHLORIDE 0.9 % IV BOLUS (SEPSIS)
1000.0000 mL | Freq: Once | INTRAVENOUS | Status: AC
  Administered 2016-04-08: 1000 mL via INTRAVENOUS

## 2016-04-08 NOTE — ED Triage Notes (Signed)
Pt c/o pain in right back radiating down right leg and migraine headache upon waking this am.

## 2016-04-08 NOTE — ED Provider Notes (Signed)
[facility] DEPT Provider Note   CSN: [id number] Arrival date & time: 04/08/16  1513     History   Chief Complaint Chief Complaint  Patient presents with  . Migraine    HPI April Ayala is a 40 y.o. female.  Patient complains of bitemporal and biparietal headache since this morning with associated low back pain with radiation down the right posterior thigh. No neurological deficits. Past medical history includes infrequent headaches. No fever, sweats, chills, stiff neck, bowel/bladder incontinence.  Severity is moderate.      Past Medical History:  Diagnosis Date  . Asthma    recent bronchitis-rescue inhaler  . Cervical cancer ([place]) 04/2011   Stage 1B squamous cell  . History of kidney stones     Patient Active Problem List   Diagnosis Date Noted  . Urinary frequency 05/11/2014  . Cervical cancer ([place]) 03/30/2012    Past Surgical History:  Procedure Laterality Date  . ABDOMINAL HYSTERECTOMY    . CERVICAL CONIZATION W/BX  03/24/2012   Procedure: CONIZATION CERVIX WITH BIOPSY;  Surgeon: Woodroe Mode, MD;  Location: [facility];  Service: Gynecology;  Laterality: N/A;  . DIAGNOSTIC LAPAROSCOPY  2007   ectopic  . ECTOPIC PREGNANCY SURGERY    . ESSURE TUBAL LIGATION  2010  . INSERTION OF SUPRAPUBIC CATHETER  05/03/2012   Procedure: INSERTION OF SUPRAPUBIC CATHETER;  Surgeon: Alvino Chapel, MD;  Location: [facility];  Service: Gynecology;;  . LEEP    . LYMPHADENECTOMY  05/03/2012   Procedure: LYMPHADENECTOMY;  Surgeon: Alvino Chapel, MD;  Location: [facility];  Service: Gynecology;  Laterality: Bilateral;  pelvic   . RADICAL HYSTERECTOMY  05/03/2012   Procedure: RADICAL HYSTERECTOMY;  Surgeon: Alvino Chapel, MD;  Location: [facility];  Service: Gynecology;;  transposition of the ovaries    OB History    Gravida Para Term Preterm AB Living   4 4 4     4    SAB TAB Ectopic Multiple Live Births                   Home Medications     Prior to Admission medications   Medication Sig Start Date End Date Taking? Authorizing Provider  Multiple Vitamins-Calcium (ONE-A-DAY WOMENS FORMULA PO) Take 1 tablet by mouth daily.   Yes Historical Provider, MD  ondansetron (ZOFRAN) 8 MG tablet Take 1 tablet (8 mg total) by mouth every 4 (four) hours as needed. 04/08/16   Nat Christen, MD    Family History Family History  Problem Relation Age of Onset  . Diabetes Mother   . Hypertension Father   . Diabetes Sister     Social History Social History  Substance Use Topics  . Smoking status: Current Every Day Smoker    Packs/day: 1.00    Years: 19.00    Types: Cigarettes  . Smokeless tobacco: Never Used     Comment: smoking cessation information given  . Alcohol use No     Allergies   Review of patient's allergies indicates no known allergies.   Review of Systems Review of Systems  All other systems reviewed and are negative.    Physical Exam Updated Vital Signs BP 133/76 (BP Location: Right Arm)   Pulse 76   Temp 98.9 F (37.2 C) (Oral)   Resp 17   Ht 5\' 3"  (1.6 m)   Wt 220 lb (99.8 kg)   LMP 04/11/2012   SpO2 100%   BMI 38.97 kg/m   Physical Exam  Constitutional: She is oriented to person, place, and time. She appears well-developed and well-nourished.  HENT:  Head: Normocephalic and atraumatic.  Eyes: Conjunctivae are normal.  Neck: Neck supple.  Cardiovascular: Normal rate and regular rhythm.   Pulmonary/Chest: Effort normal and breath sounds normal.  Abdominal: Soft. Bowel sounds are normal.  Musculoskeletal: Normal range of motion.  Minimal tenderness lower back.  Neurological: She is alert and oriented to person, place, and time.  Skin: Skin is warm and dry.  Psychiatric: She has a normal mood and affect. Her behavior is normal.  Nursing note and vitals reviewed.    ED Treatments / Results  Labs (all labs ordered are listed, but only abnormal results are displayed) Labs Reviewed - No data  to display  EKG  EKG Interpretation None       Radiology No results found.  Procedures Procedures (including critical care time)  Medications Ordered in ED Medications  sodium chloride 0.9 % bolus 1,000 mL (1,000 mLs Intravenous New Bag/Given 04/08/16 1616)  ketorolac (TORADOL) 30 MG/ML injection 30 mg (30 mg Intravenous Given 04/08/16 1617)  metoCLOPramide (REGLAN) injection 10 mg (10 mg Intravenous Given 04/08/16 1617)  diphenhydrAMINE (BENADRYL) injection 25 mg (25 mg Intravenous Given 04/08/16 1617)     Initial Impression / Assessment and Plan / ED Course  I have reviewed the triage vital signs and the nursing notes.  Pertinent labs & imaging results that were available during my care of the patient were reviewed by me and considered in my medical decision making (see chart for details).  Clinical Course    History and physical consistent with uncomplicated headache. Patient feels better after IV fluids, IV Toradol, IV Reglan, IV Benadryl. Discharge medications Zofran 8 mg  Final Clinical Impressions(s) / ED Diagnoses   Final diagnoses:  Intractable headache, unspecified chronicity pattern, unspecified headache type    New Prescriptions New Prescriptions   ONDANSETRON (ZOFRAN) 8 MG TABLET    Take 1 tablet (8 mg total) by mouth every 4 (four) hours as needed.     Nat Christen, MD 04/08/16 1745

## 2016-04-08 NOTE — Discharge Instructions (Signed)
Medication for nausea.  Increase fluids.  Rest. °

## 2016-05-10 ENCOUNTER — Encounter (HOSPITAL_COMMUNITY): Payer: Self-pay | Admitting: *Deleted

## 2016-05-10 ENCOUNTER — Emergency Department (HOSPITAL_COMMUNITY)
Admission: EM | Admit: 2016-05-10 | Discharge: 2016-05-10 | Disposition: A | Discharge status: Home / Self Care | Payer: 59 | Attending: Emergency Medicine | Admitting: Emergency Medicine

## 2016-05-10 DIAGNOSIS — Y93F2 Activity, caregiving, lifting: Secondary | ICD-10-CM | POA: Insufficient documentation

## 2016-05-10 DIAGNOSIS — T148XXA Other injury of unspecified body region, initial encounter: Secondary | ICD-10-CM

## 2016-05-10 DIAGNOSIS — J45909 Unspecified asthma, uncomplicated: Secondary | ICD-10-CM | POA: Insufficient documentation

## 2016-05-10 DIAGNOSIS — S161XXA Strain of muscle, fascia and tendon at neck level, initial encounter: Secondary | ICD-10-CM | POA: Insufficient documentation

## 2016-05-10 DIAGNOSIS — F17210 Nicotine dependence, cigarettes, uncomplicated: Secondary | ICD-10-CM | POA: Insufficient documentation

## 2016-05-10 DIAGNOSIS — X500XXA Overexertion from strenuous movement or load, initial encounter: Secondary | ICD-10-CM | POA: Insufficient documentation

## 2016-05-10 DIAGNOSIS — Y990 Civilian activity done for income or pay: Secondary | ICD-10-CM | POA: Insufficient documentation

## 2016-05-10 DIAGNOSIS — S0911XA Strain of muscle and tendon of head, initial encounter: Secondary | ICD-10-CM | POA: Insufficient documentation

## 2016-05-10 DIAGNOSIS — Y929 Unspecified place or not applicable: Secondary | ICD-10-CM | POA: Insufficient documentation

## 2016-05-10 DIAGNOSIS — M546 Pain in thoracic spine: Secondary | ICD-10-CM | POA: Insufficient documentation

## 2016-05-10 DIAGNOSIS — G44209 Tension-type headache, unspecified, not intractable: Secondary | ICD-10-CM

## 2016-05-10 MED ORDER — METOCLOPRAMIDE HCL 10 MG PO TABS: 10.0000 mg | ORAL_TABLET | Freq: Four times a day (QID) | ORAL | 0 refills | Status: DC | PRN

## 2016-05-10 MED ORDER — METOCLOPRAMIDE HCL 10 MG PO TABS
10.0000 mg | ORAL_TABLET | Freq: Once | ORAL | Status: AC
  Administered 2016-05-10: 10 mg via ORAL
  Filled 2016-05-10: qty 1

## 2016-05-10 MED ORDER — METHOCARBAMOL 500 MG PO TABS
1000.0000 mg | ORAL_TABLET | Freq: Once | ORAL | Status: AC
  Administered 2016-05-10: 1000 mg via ORAL
  Filled 2016-05-10: qty 2

## 2016-05-10 MED ORDER — METHOCARBAMOL 500 MG PO TABS
1000.0000 mg | ORAL_TABLET | Freq: Three times a day (TID) | ORAL | 0 refills | Status: DC | PRN
Start: 2016-05-10 — End: 2017-06-03

## 2016-05-10 MED ORDER — KETOROLAC TROMETHAMINE 60 MG/2ML IM SOLN
60.0000 mg | Freq: Once | INTRAMUSCULAR | Status: AC
  Administered 2016-05-10: 60 mg via INTRAMUSCULAR
  Filled 2016-05-10: qty 2

## 2016-05-10 NOTE — ED Provider Notes (Signed)
[facility] DEPT Provider Note   CSN: [id number] Arrival date & time: 05/10/16  0620     History   Chief Complaint Chief Complaint  Patient presents with  . Headache    HPI April Ayala is a 40 y.o. female.  HPI Patient has a history of chronic migraines presents with left-sided headache, neck pain and thoracic back pain for the past 3 days. She she does a lot of heavy lifting at work but does not remember any specific incident to cause of the symptoms. Pain is worse with movement of the left arm. She has mild nausea and some photophobia. She denies any weakness or numbness. She's been taking ibuprofen at home with little relief. Last dose was at 9 clock yesterday evening. She's had no fever or chills. Past Medical History:  Diagnosis Date  . Asthma    recent bronchitis-rescue inhaler  . 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 History of kidney stones     Patient Active Problem List   Diagnosis Date Noted  . Urinary frequency 05/11/2014  . Cervical cancer ([place]) 03/30/2012    Past Surgical History:  Procedure Laterality Date  . ABDOMINAL HYSTERECTOMY    . CERVICAL CONIZATION W/BX  03/24/2012   Procedure: CONIZATION CERVIX WITH BIOPSY;  Surgeon: Woodroe Mode, MD;  Location: [facility];  Service: Gynecology;  Laterality: N/A;  . DIAGNOSTIC LAPAROSCOPY  2007   ectopic  . ECTOPIC PREGNANCY SURGERY    . ESSURE TUBAL LIGATION  2010  . INSERTION OF SUPRAPUBIC CATHETER  05/03/2012   Procedure: INSERTION OF SUPRAPUBIC CATHETER;  Surgeon: Alvino Chapel, MD;  Location: [facility];  Service: Gynecology;;  . LEEP    . LYMPHADENECTOMY  05/03/2012   Procedure: LYMPHADENECTOMY;  Surgeon: Alvino Chapel, MD;  Location: [facility];  Service: Gynecology;  Laterality: Bilateral;  pelvic   . RADICAL HYSTERECTOMY  05/03/2012   Procedure: RADICAL HYSTERECTOMY;  Surgeon: Alvino Chapel, MD;  Location: [facility];  Service: Gynecology;;  transposition  of the ovaries    OB History    Gravida Para Term Preterm AB Living   4 4 4     4    SAB TAB Ectopic Multiple Live Births                   Home Medications    Prior to Admission medications   Medication Sig Start Date End Date Taking? Authorizing Provider  methocarbamol (ROBAXIN) 500 MG tablet Take 2 tablets (1,000 mg total) by mouth every 8 (eight) hours as needed for muscle spasms. 05/10/16   Julianne Rice, MD  metoCLOPramide (REGLAN) 10 MG tablet Take 1 tablet (10 mg total) by mouth every 6 (six) hours as needed for nausea (nausea/headache). 05/10/16   Julianne Rice, MD  Multiple Vitamins-Calcium (ONE-A-DAY WOMENS FORMULA PO) Take 1 tablet by mouth daily.    Historical Provider, MD  ondansetron (ZOFRAN) 8 MG tablet Take 1 tablet (8 mg total) by mouth every 4 (four) hours as needed. 04/08/16   Nat Christen, MD    Family History Family History  Problem Relation Age of Onset  . Diabetes Mother   . Hypertension Father   . Diabetes Sister     Social History Social History  Substance Use Topics  . Smoking status: Current Every Day Smoker    Packs/day: 1.00    Years: 19.00    Types: Cigarettes  . Smokeless tobacco: Never Used     Comment: smoking  cessation information given  . Alcohol use No     Allergies   Patient has no known allergies.   Review of Systems Review of Systems  Constitutional: Negative for chills and fever.  HENT: Negative for congestion, sinus pain and sore throat.   Eyes: Positive for photophobia. Negative for visual disturbance.  Respiratory: Negative for shortness of breath.   Cardiovascular: Negative for chest pain.  Gastrointestinal: Positive for nausea. Negative for abdominal pain, diarrhea and vomiting.  Musculoskeletal: Positive for back pain, myalgias and neck pain. Negative for arthralgias, joint swelling and neck stiffness.  Skin: Negative for wound.  Neurological: Positive for headaches. Negative for dizziness, weakness and numbness.    All other systems reviewed and are negative.    Physical Exam Updated Vital Signs BP 122/82 (BP Location: Left Arm)   Pulse 95   Temp 98.5 F (36.9 C) (Oral)   Resp 16   Ht 5\' 3"  (1.6 m)   Wt 216 lb (98 kg)   LMP 04/11/2012   SpO2 100%   BMI 38.26 kg/m   Physical Exam  Constitutional: She is oriented to person, place, and time. She appears well-developed and well-nourished. No distress.  HENT:  Head: Normocephalic and atraumatic.  Mouth/Throat: Oropharynx is clear and moist.  Patient with tenderness to palpation over the temporalis muscle on the left extending into the occipital region. No obvious trauma. Oropharynx is clear  Eyes: EOM are normal. Pupils are equal, round, and reactive to light.  Neck: Normal range of motion. Neck supple.  No meningismus. Patient has tenderness to palpation over the left paracervical musculature and left trapezius. No midline tenderness.  Cardiovascular: Normal rate and regular rhythm.   Pulmonary/Chest: Effort normal and breath sounds normal.  Abdominal: Soft. Bowel sounds are normal. There is no tenderness. There is no rebound and no guarding.  Musculoskeletal: Normal range of motion. She exhibits tenderness. She exhibits no edema.  Patient has tears to palpation over the medial surface of the left scapula. There is no midline thoracic or lumbar tenderness. 2+ radial pulses in bilateral upper extremities. No obvious swelling or asymmetry. Full range of motion of all joints.  Lymphadenopathy:    She has no cervical adenopathy.  Neurological: She is alert and oriented to person, place, and time.  5/5 bilateral shoulder abduction. 5/5 bilateral grip strength. Sensation is fully intact.  Skin: Skin is warm and dry. Capillary refill takes less than 2 seconds. No rash noted. No erythema.  Psychiatric: She has a normal mood and affect. Her behavior is normal.  Nursing note and vitals reviewed.    ED Treatments / Results  Labs (all labs ordered  are listed, but only abnormal results are displayed) Labs Reviewed - No data to display  EKG  EKG Interpretation None       Radiology No results found.  Procedures Procedures (including critical care time)  Medications Ordered in ED Medications  ketorolac (TORADOL) injection 60 mg (60 mg Intramuscular Given 05/10/16 0737)  methocarbamol (ROBAXIN) tablet 1,000 mg (1,000 mg Oral Given 05/10/16 0736)  metoCLOPramide (REGLAN) tablet 10 mg (10 mg Oral Given 05/10/16 0735)     Initial Impression / Assessment and Plan / ED Course  I have reviewed the triage vital signs and the nursing notes.  Pertinent labs & imaging results that were available during my care of the patient were reviewed by me and considered in my medical decision making (see chart for details).  Clinical Course    Patient with chronic migraines.  States she's been under increased stress. Most of her symptoms today seem to be related to muscle tension and spasm. She has a normal neurologic exam. No red flag signs or symptoms. We'll treat symptomatically. Advised to reduce stress. Patient is resting comfortably. Improved symptoms. We'll discharge home with return precautions. Final Clinical Impressions(s) / ED Diagnoses   Final diagnoses:  Tension headache  Muscle strain    New Prescriptions New Prescriptions   METHOCARBAMOL (ROBAXIN) 500 MG TABLET    Take 2 tablets (1,000 mg total) by mouth every 8 (eight) hours as needed for muscle spasms.   METOCLOPRAMIDE (REGLAN) 10 MG TABLET    Take 1 tablet (10 mg total) by mouth every 6 (six) hours as needed for nausea (nausea/headache).     Julianne Rice, MD 05/10/16 [phone]

## 2016-05-10 NOTE — ED Triage Notes (Signed)
Pt c/o head and left neck pain that started yesterday

## 2016-08-18 DIAGNOSIS — Z8541 Personal history of malignant neoplasm of cervix uteri: Secondary | ICD-10-CM | POA: Insufficient documentation

## 2016-08-18 DIAGNOSIS — R0789 Other chest pain: Secondary | ICD-10-CM | POA: Insufficient documentation

## 2016-08-18 DIAGNOSIS — F17210 Nicotine dependence, cigarettes, uncomplicated: Secondary | ICD-10-CM | POA: Insufficient documentation

## 2016-08-18 DIAGNOSIS — J45909 Unspecified asthma, uncomplicated: Secondary | ICD-10-CM | POA: Insufficient documentation

## 2016-08-19 ENCOUNTER — Emergency Department (HOSPITAL_COMMUNITY): Payer: Self-pay

## 2016-08-19 ENCOUNTER — Emergency Department (HOSPITAL_COMMUNITY)
Admission: EM | Admit: 2016-08-19 | Discharge: 2016-08-19 | Disposition: A | Discharge status: Home / Self Care | Payer: Self-pay | Attending: Emergency Medicine | Admitting: Emergency Medicine

## 2016-08-19 ENCOUNTER — Encounter (HOSPITAL_COMMUNITY): Payer: Self-pay | Admitting: *Deleted

## 2016-08-19 DIAGNOSIS — R0789 Other chest pain: Secondary | ICD-10-CM

## 2016-08-19 MED ORDER — CYCLOBENZAPRINE HCL 5 MG PO TABS
5.0000 mg | ORAL_TABLET | Freq: Three times a day (TID) | ORAL | 0 refills | Status: DC | PRN
Start: 2016-08-19 — End: 2017-06-03

## 2016-08-19 MED ORDER — NAPROXEN 500 MG PO TABS: ORAL_TABLET | ORAL | 0 refills | Status: DC

## 2016-08-19 NOTE — ED Triage Notes (Signed)
Pt c/o knot to left side of chest, pt states yesterday she felt a "pop" and then a burning sensation radiating down her left arm and left side

## 2016-08-19 NOTE — ED Provider Notes (Signed)
[facility] DEPT Provider Note   CSN: [id number] Arrival date & time: 08/18/16  2359  By signing my name below, I, Reola Mosher, attest that this documentation has been prepared under the direction and in the presence of Rolland Porter, MD. Electronically Signed: Reola Mosher, ED Scribe. 08/19/16. 12:27 AM.  Time seen 12:21 AM  History   Chief Complaint Chief Complaint  Patient presents with  . Abscess   The history is provided by the patient. No language interpreter was used.    HPI Comments: April Ayala is a 41 y.o. female with a h/o stage 1B cervical cancer, who presents to the Emergency Department complaining of a moderate, gradually worsening area of pain and swelling to the upper left breast onset one month ago. She notes radiation of her pain into her left shoulder and arm. No color changes over the skin. Pt notes that yesterday at work she felt a pop to the left sided chest and this has sustained pain to her left side since. Pt also reports that she has had a cough over the last month, but denies her pain being sustained with her cough. No recent trauma, heavy lifting. No h/o similar symptoms. No recent contact with individuals who have had prior abscesses, or prior hx of abscess. Pt states pain is exacerbated with palpation and direct pressure. She has been taking 500mg  Robaxin at home for her pain without relief. Her last mammogram was one year ago which was benign. No FHx of brest cancer, but MOP has fibrocystic breast disease. She smokes 4-5 cigarettes per day. She is an occasional drinker. Denies fever, chills, sore throat, rhinorrhea, or any other associated symptoms.   PCP: [facility] Oncologist: Dr Aldean Ast with [facility]  Past Medical History:  Diagnosis Date  . Asthma    recent bronchitis-rescue inhaler  . 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 History of kidney stones    Patient Active Problem  List   Diagnosis Date Noted  . Urinary frequency 05/11/2014  . Cervical cancer ([place]) 03/30/2012   Past Surgical History:  Procedure Laterality Date  . ABDOMINAL HYSTERECTOMY    . CERVICAL CONIZATION W/BX  03/24/2012   Procedure: CONIZATION CERVIX WITH BIOPSY;  Surgeon: Woodroe Mode, MD;  Location: [facility];  Service: Gynecology;  Laterality: N/A;  . DIAGNOSTIC LAPAROSCOPY  2007   ectopic  . ECTOPIC PREGNANCY SURGERY    . ESSURE TUBAL LIGATION  2010  . INSERTION OF SUPRAPUBIC CATHETER  05/03/2012   Procedure: INSERTION OF SUPRAPUBIC CATHETER;  Surgeon: Alvino Chapel, MD;  Location: [facility];  Service: Gynecology;;  . LEEP    . LYMPHADENECTOMY  05/03/2012   Procedure: LYMPHADENECTOMY;  Surgeon: Alvino Chapel, MD;  Location: [facility];  Service: Gynecology;  Laterality: Bilateral;  pelvic   . RADICAL HYSTERECTOMY  05/03/2012   Procedure: RADICAL HYSTERECTOMY;  Surgeon: Alvino Chapel, MD;  Location: [facility];  Service: Gynecology;;  transposition of the ovaries   OB History    Gravida Para Term Preterm AB Living   4 4 4     4    SAB TAB Ectopic Multiple Live Births                 Home Medications    Prior to Admission medications   Medication Sig Start Date End Date Taking? Authorizing Provider  cyclobenzaprine (FLEXERIL) 5 MG tablet Take 1 tablet (5 mg total) by mouth 3 (three)  times daily as needed. 08/19/16   Rolland Porter, MD  methocarbamol (ROBAXIN) 500 MG tablet Take 2 tablets (1,000 mg total) by mouth every 8 (eight) hours as needed for muscle spasms. 05/10/16   Julianne Rice, MD  metoCLOPramide (REGLAN) 10 MG tablet Take 1 tablet (10 mg total) by mouth every 6 (six) hours as needed for nausea (nausea/headache). 05/10/16   Julianne Rice, MD  Multiple Vitamins-Calcium (ONE-A-DAY WOMENS FORMULA PO) Take 1 tablet by mouth daily.    Historical Provider, MD  naproxen (NAPROSYN) 500 MG tablet Take 1 po BID with food prn pain 08/19/16   Rolland Porter, MD  ondansetron  (ZOFRAN) 8 MG tablet Take 1 tablet (8 mg total) by mouth every 4 (four) hours as needed. 04/08/16   Nat Christen, MD   Family History Family History  Problem Relation Age of Onset  . Diabetes Mother   . Hypertension Father   . Diabetes Sister    Social History Social History  Substance Use Topics  . Smoking status: Current Every Day Smoker    Packs/day: 1.00    Years: 19.00    Types: Cigarettes  . Smokeless tobacco: Never Used     Comment: smoking cessation information given  . Alcohol use No  employed   Allergies   Patient has no known allergies.  Review of Systems Review of Systems  Constitutional: Negative for chills and fever.  HENT: Negative for rhinorrhea and sore throat.   Cardiovascular: Positive for chest pain (wall, area of pain and swelling to the upper left breast).  All other systems reviewed and are negative.  Physical Exam Updated Vital Signs BP 144/65 (BP Location: Right Arm)   Pulse 98   Temp 98.5 F (36.9 C) (Oral)   Resp 18   Ht 5\' 3"  (1.6 m)   Wt 216 lb (98 kg)   LMP 04/11/2012   SpO2 99%   BMI 38.26 kg/m   Vital signs normal    Physical Exam  Constitutional: She is oriented to person, place, and time. She appears well-developed and well-nourished.  Non-toxic appearance. She does not appear ill. No distress.  HENT:  Head: Normocephalic and atraumatic.  Right Ear: External ear normal.  Left Ear: External ear normal.  Nose: Nose normal. No mucosal edema or rhinorrhea.  Mouth/Throat: Oropharynx is clear and moist and mucous membranes are normal. No dental abscesses or uvula swelling.  Eyes: Conjunctivae and EOM are normal. Pupils are equal, round, and reactive to light.  Neck: Normal range of motion and full passive range of motion without pain. Neck supple.  Cardiovascular: Normal rate, regular rhythm and normal heart sounds.  Exam reveals no gallop and no friction rub.   No murmur heard. Pulmonary/Chest: Effort normal and breath sounds  normal. No respiratory distress. She has no wheezes. She has no rhonchi. She has no rales. She exhibits tenderness. She exhibits no crepitus.    Chaperone present throughout entire exam. Breast was without masses. Superior to the breast tissue on the lateral chest she was very tender on the lateral muscle mass which hurt with ROM of the left arm. No discrete mass or abscess. Normal skin color without redness or warmth.   She indicates area of her posterior chest where she felt a pop. Questionable 11th rib, she is not tender over the posterior rib cage.   Abdominal: Soft. Normal appearance and bowel sounds are normal. She exhibits no distension. There is no tenderness. There is no rebound and no guarding.  Musculoskeletal: Normal  range of motion. She exhibits no edema or tenderness.       Back:  Moves all extremities well.   Neurological: She is alert and oriented to person, place, and time. She has normal strength. No cranial nerve deficit.  Skin: Skin is warm, dry and intact. No rash noted. No erythema. No pallor.  Psychiatric: She has a normal mood and affect. Her speech is normal and behavior is normal. Her mood appears not anxious.  Nursing note and vitals reviewed.  ED Treatments / Results  DIAGNOSTIC STUDIES: Oxygen Saturation is 99% on RA, normal by my interpretation.   Labs (all labs ordered are listed, but only abnormal results are displayed) Labs Reviewed - No data to display  EKG  EKG Interpretation None      Radiology Dg Ribs Unilateral W/chest Left  Result Date: 08/19/2016 CLINICAL DATA:  Patient heard a pop and then burning sensation down left side. EXAM: LEFT RIBS AND CHEST - 3+ VIEW COMPARISON:  None. FINDINGS: No fracture or other bone lesions are seen involving the ribs. There is no evidence of pneumothorax or pleural effusion. Both lungs are clear. Heart size and mediastinal contours are within normal limits. IMPRESSION: Negative. Electronically Signed   By:  Ashley Royalty M.D.   On: 08/19/2016 01:10    Procedures Procedures   Medications Ordered in ED Medications - No data to display  Initial Impression / Assessment and Plan / ED Course  I have reviewed the triage vital signs and the nursing notes.  Pertinent labs & imaging results that were available during my care of the patient were reviewed by me and considered in my medical decision making (see chart for details).   COORDINATION OF CARE: 12:32 AM-Discussed next steps with pt including CXR. Also discussed f/u w/ oncologist and monitoring the area. If worse prompt f/u indicated for mammogram. Pt verbalized understanding and is agreeable with the plan.   Pt started on NSAIS and different muscle relaxer for her musculoskeletal pain. She should f/u with her doctors if not improving in the next week.   Final Clinical Impressions(s) / ED Diagnoses   Final diagnoses:  Musculoskeletal chest pain   New Prescriptions New Prescriptions   CYCLOBENZAPRINE (FLEXERIL) 5 MG TABLET    Take 1 tablet (5 mg total) by mouth 3 (three) times daily as needed.   NAPROXEN (NAPROSYN) 500 MG TABLET    Take 1 po BID with food prn pain   Plan discharge  Rolland Porter, MD, FACEP   I personally performed the services described in this documentation, which was scribed in my presence. The recorded information has been reviewed and considered.  Rolland Porter, MD, Barbette Or, MD 08/19/16 [phone]

## 2016-08-19 NOTE — Discharge Instructions (Signed)
Use ice and heat for comfort over the painful areas. Take the medications as prescribed.  Follow up with your doctors if the pain isn't improving over the next week.   Recheck if you get a fever, a reddened area in your skin, or swelling.

## 2016-12-23 ENCOUNTER — Encounter (HOSPITAL_COMMUNITY): Payer: Self-pay | Admitting: Emergency Medicine

## 2016-12-23 ENCOUNTER — Emergency Department (HOSPITAL_COMMUNITY): Payer: Self-pay

## 2016-12-23 ENCOUNTER — Emergency Department (HOSPITAL_COMMUNITY)
Admission: EM | Admit: 2016-12-23 | Discharge: 2016-12-23 | Disposition: A | Discharge status: Home / Self Care | Payer: Self-pay | Attending: Emergency Medicine | Admitting: Emergency Medicine

## 2016-12-23 DIAGNOSIS — Z7951 Long term (current) use of inhaled steroids: Secondary | ICD-10-CM | POA: Insufficient documentation

## 2016-12-23 DIAGNOSIS — Y939 Activity, unspecified: Secondary | ICD-10-CM | POA: Insufficient documentation

## 2016-12-23 DIAGNOSIS — Z791 Long term (current) use of non-steroidal anti-inflammatories (NSAID): Secondary | ICD-10-CM | POA: Insufficient documentation

## 2016-12-23 DIAGNOSIS — M25551 Pain in right hip: Secondary | ICD-10-CM | POA: Insufficient documentation

## 2016-12-23 DIAGNOSIS — M7061 Trochanteric bursitis, right hip: Secondary | ICD-10-CM | POA: Insufficient documentation

## 2016-12-23 DIAGNOSIS — F17210 Nicotine dependence, cigarettes, uncomplicated: Secondary | ICD-10-CM | POA: Insufficient documentation

## 2016-12-23 DIAGNOSIS — J45909 Unspecified asthma, uncomplicated: Secondary | ICD-10-CM | POA: Insufficient documentation

## 2016-12-23 DIAGNOSIS — Z8541 Personal history of malignant neoplasm of cervix uteri: Secondary | ICD-10-CM | POA: Insufficient documentation

## 2016-12-23 LAB — URINALYSIS, ROUTINE W REFLEX MICROSCOPIC
BILIRUBIN URINE: NEGATIVE
GLUCOSE, UA: NEGATIVE mg/dL
HGB URINE DIPSTICK: NEGATIVE
Ketones, ur: NEGATIVE mg/dL
Leukocytes, UA: NEGATIVE
Nitrite: NEGATIVE
PH: 7 (ref 5.0–8.0)
Protein, ur: NEGATIVE mg/dL
SPECIFIC GRAVITY, URINE: 1.017 (ref 1.005–1.030)

## 2016-12-23 MED ORDER — NAPROXEN 500 MG PO TABS: 500.0000 mg | ORAL_TABLET | Freq: Two times a day (BID) | ORAL | 0 refills | Status: DC

## 2016-12-23 NOTE — ED Triage Notes (Signed)
Pt c/o right hip pain and swelling of right hip for a month. No recent injury. Pt also c/o burning and "pressure" when urinating. Hx of kidney stones.

## 2016-12-23 NOTE — ED Provider Notes (Signed)
[facility] DEPT Provider Note   CSN: [id number] Arrival date & time: 12/23/16  1057     History   Chief Complaint Chief Complaint  Patient presents with  . Hip Pain    HPI April Ayala is a 41 y.o. female.  HPI Pain in the right hip and groin area.  Started one month ago.  No known injuries.  Pain increases with walking.  No vomiting or diarrhea.  No trouble urinating.  No numbness or weakness in her foot or leg.  It does feel numb in the groin.  Pt has not seen anyone for it.  Family convinced her to come in. Past Medical History:  Diagnosis Date  . Asthma    recent bronchitis-rescue inhaler  . 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 History of kidney stones     Patient Active Problem List   Diagnosis Date Noted  . Urinary frequency 05/11/2014  . Cervical cancer ([place]) 03/30/2012    Past Surgical History:  Procedure Laterality Date  . ABDOMINAL HYSTERECTOMY    . CERVICAL CONIZATION W/BX  03/24/2012   Procedure: CONIZATION CERVIX WITH BIOPSY;  Surgeon: Woodroe Mode, MD;  Location: [facility];  Service: Gynecology;  Laterality: N/A;  . DIAGNOSTIC LAPAROSCOPY  2007   ectopic  . ECTOPIC PREGNANCY SURGERY    . ESSURE TUBAL LIGATION  2010  . INSERTION OF SUPRAPUBIC CATHETER  05/03/2012   Procedure: INSERTION OF SUPRAPUBIC CATHETER;  Surgeon: Alvino Chapel, MD;  Location: [facility];  Service: Gynecology;;  . LEEP    . LYMPHADENECTOMY  05/03/2012   Procedure: LYMPHADENECTOMY;  Surgeon: Alvino Chapel, MD;  Location: [facility];  Service: Gynecology;  Laterality: Bilateral;  pelvic   . RADICAL HYSTERECTOMY  05/03/2012   Procedure: RADICAL HYSTERECTOMY;  Surgeon: Alvino Chapel, MD;  Location: [facility];  Service: Gynecology;;  transposition of the ovaries    OB History    Gravida Para Term Preterm AB Living   4 4 4     4    SAB TAB Ectopic Multiple Live Births                   Home Medications    Prior to Admission  medications   Medication Sig Start Date End Date Taking? Authorizing Provider  albuterol (PROVENTIL HFA;VENTOLIN HFA) 108 (90 Base) MCG/ACT inhaler Inhale 1-2 puffs into the lungs every 6 (six) hours as needed for wheezing or shortness of breath.   Yes [provider]  Naproxen Sodium (ALEVE PO) Take 1 tablet by mouth 2 (two) times daily.   Yes [provider]  cyclobenzaprine (FLEXERIL) 5 MG tablet Take 1 tablet (5 mg total) by mouth 3 (three) times daily as needed. Patient not taking: Reported on 12/23/2016 08/19/16   Rolland Porter, MD  methocarbamol (ROBAXIN) 500 MG tablet Take 2 tablets (1,000 mg total) by mouth every 8 (eight) hours as needed for muscle spasms. Patient not taking: Reported on 12/23/2016 05/10/16   Julianne Rice, MD  metoCLOPramide (REGLAN) 10 MG tablet Take 1 tablet (10 mg total) by mouth every 6 (six) hours as needed for nausea (nausea/headache). Patient not taking: Reported on 12/23/2016 05/10/16   Julianne Rice, MD  Multiple Vitamins-Calcium (ONE-A-DAY WOMENS FORMULA PO) Take 1 tablet by mouth daily.    [provider]  naproxen (NAPROSYN) 500 MG tablet Take 1 tablet (500 mg total) by mouth 2 (two) times daily with a meal. As needed for pain 12/23/16  Dorie Rank, MD  ondansetron (ZOFRAN) 8 MG tablet Take 1 tablet (8 mg total) by mouth every 4 (four) hours as needed. Patient not taking: Reported on 12/23/2016 04/08/16   Nat Christen, MD    Family History Family History  Problem Relation Age of Onset  . Diabetes Mother   . Hypertension Father   . Diabetes Sister     Social History Social History  Substance Use Topics  . Smoking status: Current Every Day Smoker    Packs/day: 1.00    Years: 19.00    Types: Cigarettes  . Smokeless tobacco: Never Used     Comment: smoking cessation information given  . Alcohol use No     Allergies   Patient has no known allergies.   Review of Systems Review of Systems  Constitutional: Negative for  fever.  Gastrointestinal: Negative for abdominal pain.  Genitourinary: Negative for difficulty urinating.       Feels pressure when urinating occasionally  Neurological: Negative for weakness.       No saddle anesthesia  All other systems reviewed and are negative.    Physical Exam Updated Vital Signs BP 112/74   Pulse 70   Temp 98.3 F (36.8 C) (Oral)   Resp 16   Ht 1.6 m (5\' 3" )   Wt 104.3 kg (230 lb)   LMP 04/11/2012   SpO2 98%   BMI 40.74 kg/m   Physical Exam  Constitutional: She appears well-developed and well-nourished. No distress.  HENT:  Head: Normocephalic and atraumatic.  Right Ear: External ear normal.  Left Ear: External ear normal.  Eyes: Conjunctivae are normal. Right eye exhibits no discharge. Left eye exhibits no discharge. No scleral icterus.  Neck: Neck supple. No tracheal deviation present.  Cardiovascular: Normal rate.   Pulmonary/Chest: Effort normal. No stridor. No respiratory distress.  Abdominal: She exhibits no distension and no mass. There is no tenderness. There is no guarding. No hernia (no inguinal hernia).  Musculoskeletal: She exhibits no edema.  ttp right lateral hip area of greater trochanter, no edema,   Neurological: She is alert. Cranial nerve deficit: no gross deficits.  5/5 le strength  Skin: Skin is warm and dry. No rash noted.  Psychiatric: She has a normal mood and affect.  Nursing note and vitals reviewed.    ED Treatments / Results  Labs (all labs ordered are listed, but only abnormal results are displayed) Labs Reviewed  URINALYSIS, ROUTINE W REFLEX MICROSCOPIC - Abnormal; Notable for the following:       Result Value   APPearance HAZY (*)    All other components within normal limits     Radiology Dg Hip Unilat With Pelvis 2-3 Views Right  Result Date: 12/23/2016 CLINICAL DATA:  Right hip pain, swelling.  No known recent injury. EXAM: DG HIP (WITH OR WITHOUT PELVIS) 2-3V RIGHT COMPARISON:  None. FINDINGS: No acute  bony abnormality. Specifically, no fracture, subluxation, or dislocation. Soft tissues are intact. Hip joints and SI joints are symmetric and unremarkable. IMPRESSION: No acute bony abnormality. Electronically Signed   By: Rolm Baptise M.D.   On: 12/23/2016 12:44    Procedures Procedures (including critical care time)  Medications Ordered in ED Medications - No data to display   Initial Impression / Assessment and Plan / ED Course  I have reviewed the triage vital signs and the nursing notes.  Pertinent labs & imaging results that were available during my care of the patient were reviewed by me and considered in my  medical decision making (see chart for details).    The patient has tender to palpation over the right greater trochanter. X-rays are unremarkable. No specific findings noted on exam to account for her pain. Urinalysis does not suggest urinary tract infection or kidney stone.  We'll treat with NSAIDs for possible trochanteric bursitis. Recommend follow-up with the primary care doctor or an orthopedic doctor if the symptoms do not resolve over the next couple of weeks.  Final Clinical Impressions(s) / ED Diagnoses   Final diagnoses:  Right hip pain  Trochanteric bursitis of right hip    New Prescriptions New Prescriptions   NAPROXEN (NAPROSYN) 500 MG TABLET    Take 1 tablet (500 mg total) by mouth 2 (two) times daily with a meal. As needed for pain     Dorie Rank, MD 12/23/16 1417

## 2016-12-23 NOTE — ED Notes (Signed)
Pt states R hip swelling and pain with ambulation X1 month. Endorses "pressure" with urination, denies hematuria, polyuria, dysuria. Denies injury to hip or past injury.

## 2016-12-23 NOTE — Discharge Instructions (Signed)
The x-rays were normal today. Take the medications as needed for pain, follow-up with an orthopedic doctor if the symptoms persist, try taking anti-inflammatory medications to see if that helps with your pain.

## 2017-06-03 ENCOUNTER — Emergency Department (HOSPITAL_COMMUNITY)
Admission: EM | Admit: 2017-06-03 | Discharge: 2017-06-03 | Disposition: A | Discharge status: Home / Self Care | Payer: BLUE CROSS/BLUE SHIELD | Attending: Emergency Medicine | Admitting: Emergency Medicine

## 2017-06-03 ENCOUNTER — Encounter (HOSPITAL_COMMUNITY): Payer: Self-pay | Admitting: Emergency Medicine

## 2017-06-03 ENCOUNTER — Other Ambulatory Visit: Payer: Self-pay

## 2017-06-03 DIAGNOSIS — Z8541 Personal history of malignant neoplasm of cervix uteri: Secondary | ICD-10-CM | POA: Diagnosis not present

## 2017-06-03 DIAGNOSIS — N644 Mastodynia: Secondary | ICD-10-CM | POA: Insufficient documentation

## 2017-06-03 DIAGNOSIS — J45909 Unspecified asthma, uncomplicated: Secondary | ICD-10-CM | POA: Diagnosis not present

## 2017-06-03 DIAGNOSIS — F17210 Nicotine dependence, cigarettes, uncomplicated: Secondary | ICD-10-CM | POA: Diagnosis not present

## 2017-06-03 MED ORDER — TRAMADOL HCL 50 MG PO TABS: 50.0000 mg | ORAL_TABLET | Freq: Four times a day (QID) | ORAL | 0 refills | Status: DC | PRN

## 2017-06-03 NOTE — ED Triage Notes (Signed)
Pain and swelling to lt breast x 1 month.  Has Dr appoint dec 14, but pain has gotten worse.

## 2017-06-03 NOTE — ED Provider Notes (Signed)
[facility] EMERGENCY DEPARTMENT Provider Note   CSN: [id number] Arrival date & time: 06/03/17  1641     History   Chief Complaint Chief Complaint  Patient presents with  . Breast Pain    HPI April Ayala is a 41 y.o. female.  HPI  Pt was seen at 1730.  Per pt, c/o gradual onset and persistence of constant left breast "pain" for the past 1 month. Pt states she has a doctor's appointment on 06/18/17, but feels she is "getting worse."  Pain is located near her left lower-lateral breast and nipple area. Denies injury, no rash, no fevers, no nipple discharge, no CP/palpitations, no SOB/cough.      Past Medical History:  Diagnosis Date  . Asthma    recent bronchitis-rescue inhaler  . Cervical cancer ([place]) 04/2011   Stage 1B squamous cell  . History of kidney stones     Patient Active Problem List   Diagnosis Date Noted  . Urinary frequency 05/11/2014  . Cervical cancer ([place]) 03/30/2012    Past Surgical History:  Procedure Laterality Date  . ABDOMINAL HYSTERECTOMY    . CERVICAL CONIZATION W/BX  03/24/2012   Procedure: CONIZATION CERVIX WITH BIOPSY;  Surgeon: Woodroe Mode, MD;  Location: [facility];  Service: Gynecology;  Laterality: N/A;  . DIAGNOSTIC LAPAROSCOPY  2007   ectopic  . ECTOPIC PREGNANCY SURGERY    . ESSURE TUBAL LIGATION  2010  . INSERTION OF SUPRAPUBIC CATHETER  05/03/2012   Procedure: INSERTION OF SUPRAPUBIC CATHETER;  Surgeon: Alvino Chapel, MD;  Location: [facility];  Service: Gynecology;;  . LEEP    . LYMPHADENECTOMY  05/03/2012   Procedure: LYMPHADENECTOMY;  Surgeon: Alvino Chapel, MD;  Location: [facility];  Service: Gynecology;  Laterality: Bilateral;  pelvic   . RADICAL HYSTERECTOMY  05/03/2012   Procedure: RADICAL HYSTERECTOMY;  Surgeon: Alvino Chapel, MD;  Location: [facility];  Service: Gynecology;;  transposition of the ovaries    OB History    Gravida Para Term Preterm AB Living   4 4 4     4    SAB TAB Ectopic  Multiple Live Births                   Home Medications    Prior to Admission medications   Medication Sig Start Date End Date Taking? Authorizing Provider  naproxen (NAPROSYN) 500 MG tablet Take 1 tablet (500 mg total) by mouth 2 (two) times daily with a meal. As needed for pain Patient not taking: Reported on 06/03/2017 12/23/16   Dorie Rank, MD    Family History Family History  Problem Relation Age of Onset  . Diabetes Mother   . Hypertension Father   . Diabetes Sister     Social History Social History   Tobacco Use  . Smoking status: Current Every Day Smoker    Packs/day: 1.00    Years: 19.00    Pack years: 19.00    Types: Cigarettes  . Smokeless tobacco: Never Used  . Tobacco comment: smoking cessation information given  Substance Use Topics  . Alcohol use: No  . Drug use: No     Allergies   Patient has no known allergies.   Review of Systems Review of Systems ROS: Statement: All systems negative except as marked or noted in the HPI; Constitutional: Negative for fever and chills. ; ; Eyes: Negative for eye pain, redness and discharge. ; ; ENMT: Negative for ear pain, hoarseness, nasal congestion, sinus pressure  and sore throat. ; ; Cardiovascular: Negative for chest pain, palpitations, diaphoresis, dyspnea and peripheral edema. ; ; Respiratory: Negative for cough, wheezing and stridor. ; ; Gastrointestinal: Negative for nausea, vomiting, diarrhea, abdominal pain, blood in stool, hematemesis, jaundice and rectal bleeding. . ; ; Genitourinary: Negative for dysuria, flank pain and hematuria. ; ; Musculoskeletal: +left breast pain. Negative for back pain and neck pain. Negative for swelling and trauma.; ; Skin: Negative for pruritus, rash, abrasions, blisters, bruising and skin lesion.; ; Neuro: Negative for headache, lightheadedness and neck stiffness. Negative for weakness, altered level of consciousness, altered mental status, extremity weakness, paresthesias,  involuntary movement, seizure and syncope.       Physical Exam Updated Vital Signs BP 121/74 (BP Location: Right Arm)   Pulse 87   Temp 98.4 F (36.9 C) (Oral)   Resp 20   Ht 5\' 3"  (1.6 m)   Wt 106.6 kg (235 lb)   LMP 04/11/2012   SpO2 99%   BMI 41.63 kg/m   Physical Exam 1735: Physical examination:  Nursing notes reviewed; Vital signs and O2 SAT reviewed;  Constitutional: Well developed, Well nourished, Well hydrated, In no acute distress; Head:  Normocephalic, atraumatic; Eyes: EOMI, PERRL, No scleral icterus; ENMT: Mouth and pharynx normal, Mucous membranes moist; Neck: Supple, Full range of motion, No lymphadenopathy; Cardiovascular: Regular rate and rhythm, No gallop; Respiratory: Breath sounds clear & equal bilaterally, No wheezes.  Speaking full sentences with ease, Normal respiratory effort/excursion; Chest: No rash. No soft tissue crepitus, no deformity. Movement normal. Breast exam performed with permission of pt and female RN chaperone:  Left breast soft, TTP esp LLQ and RUQ, no obvious fluctuance, no rash, no nipple discharge.; Abdomen: Soft, Nontender, Nondistended, Normal bowel sounds; Genitourinary: No CVA tenderness; Extremities: Pulses normal, No tenderness, No edema, No calf edema or asymmetry.; Neuro: AA&Ox3, Major CN grossly intact.  Speech clear. No gross focal motor or sensory deficits in extremities.; Skin: Color normal, Warm, Dry.    ED Treatments / Results  Labs (all labs ordered are listed, but only abnormal results are displayed)   EKG  EKG Interpretation None       Radiology   Procedures Procedures (including critical care time)  Medications Ordered in ED Medications - No data to display   Initial Impression / Assessment and Plan / ED Course  I have reviewed the triage vital signs and the nursing notes.  Pertinent labs & imaging results that were available during my care of the patient were reviewed by me and considered in my medical  decision making (see chart for details).  MDM Reviewed: previous chart, nursing note and vitals    1750:  No obvious rash to suggest cellulitis. Will refer to Breast Center. Dx d/w pt and family.  Questions answered.  Verb understanding, agreeable to d/c home with outpt f/u.      Final Clinical Impressions(s) / ED Diagnoses   Final diagnoses:  None    ED Discharge Orders    None       Francine Graven, DO 06/07/17 2338

## 2017-06-03 NOTE — Discharge Instructions (Signed)
Take the prescription as directed.  Call the Breast Center tomorrow to schedule a follow up appointment within the next 2 days.  Return to the Emergency Department immediately sooner if worsening.

## 2017-06-04 ENCOUNTER — Encounter (HOSPITAL_COMMUNITY): Payer: Self-pay | Admitting: Emergency Medicine

## 2017-06-04 ENCOUNTER — Other Ambulatory Visit: Payer: Self-pay

## 2017-06-04 ENCOUNTER — Ambulatory Visit (HOSPITAL_COMMUNITY)
Admission: EM | Admit: 2017-06-04 | Discharge: 2017-06-04 | Disposition: A | Discharge status: Home / Self Care | Payer: BLUE CROSS/BLUE SHIELD | Attending: Emergency Medicine | Admitting: Emergency Medicine

## 2017-06-04 DIAGNOSIS — N63 Unspecified lump in unspecified breast: Secondary | ICD-10-CM | POA: Diagnosis not present

## 2017-06-04 DIAGNOSIS — N644 Mastodynia: Secondary | ICD-10-CM | POA: Diagnosis not present

## 2017-06-04 DIAGNOSIS — M25512 Pain in left shoulder: Secondary | ICD-10-CM

## 2017-06-04 MED ORDER — DICLOFENAC SODIUM 75 MG PO TBEC: 75.0000 mg | DELAYED_RELEASE_TABLET | Freq: Two times a day (BID) | ORAL | 0 refills | Status: DC

## 2017-06-04 MED ORDER — METHOCARBAMOL 750 MG PO TABS: 750.0000 mg | ORAL_TABLET | ORAL | 0 refills | Status: DC

## 2017-06-04 NOTE — Discharge Instructions (Signed)
Stop Naprosyn.  Take diclofenac 75 mg with 1 g of Tylenol twice a day.  Ice your shoulder after use.  Sleep with a pillow between her arm and your body at night.  Follow-up with Dr.Xu, orthopedic surgeon if no better in 2 weeks.  I have put in another referral to the breast center.  They should be calling you to schedule an appointment.  Call them if you do not hear from them in the next several days.  Make sure the results are sent to your primary care physician.

## 2017-06-04 NOTE — ED Provider Notes (Signed)
HPI  SUBJECTIVE:  April Ayala is a 41 y.o. female who presents with 2 complaints.  First she reports several days of stabbing, intermittent left breast pain.  This is present with palpation and worse with wearing her bra.  She reports erythema, swelling along the top of her breast.  She denies fevers, trauma, nipple discharge, unintentional weight loss.  She was seen in the ER last night for this, and mammogram at the breast center was ordered.  Sent home with tramadol.  States that she tried this and did not like it.  States that she called the breast center today and they told her to come to urgent care for referral.  She has also tried Naprosyn 660 mg daily without improvement of symptoms.  Symptoms worse with palpation and with wearing her bra.  Second, she reports 1 month of constant, daily left shoulder pain.  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  She tried Naprosyn without improvement of symptoms.  Symptoms are worse with lying on it, abduction.  She states that the pain is waking her up at night.  She denies trauma to her shoulder, other change in her physical activity.  No grip weakness.  She is right-handed.  She has a past medical history of cervical cancer status post hysterectomy, asthma.  No history of rotator cuff or shoulder injury.  Family history negative for breast cancer.  PMD: She is establishing care at [facility] primary care on 12/14.   Past Medical History:  Diagnosis Date  . Asthma    recent bronchitis-rescue inhaler  . Cervical cancer ([place]) 04/2011   Stage 1B squamous cell  . History of kidney stones     Past Surgical History:  Procedure Laterality Date  . ABDOMINAL HYSTERECTOMY    . CERVICAL CONIZATION W/BX  03/24/2012   Procedure: CONIZATION CERVIX WITH BIOPSY;  Surgeon: Woodroe Mode, MD;  Location: [facility];  Service: Gynecology;  Laterality: N/A;  . DIAGNOSTIC LAPAROSCOPY  2007   ectopic  . ECTOPIC PREGNANCY SURGERY    . ESSURE TUBAL LIGATION  2010  . INSERTION OF SUPRAPUBIC CATHETER  05/03/2012   Procedure: INSERTION OF SUPRAPUBIC CATHETER;  Surgeon: Alvino Chapel, MD;  Location: [facility];  Service: Gynecology;;  . LEEP    . LYMPHADENECTOMY  05/03/2012   Procedure: LYMPHADENECTOMY;  Surgeon: Alvino Chapel, MD;  Location: [facility];  Service: Gynecology;  Laterality: Bilateral;  pelvic   . RADICAL HYSTERECTOMY  05/03/2012   Procedure: RADICAL HYSTERECTOMY;  Surgeon: Alvino Chapel, MD;  Location: [facility];  Service: Gynecology;;  transposition of the ovaries    Family History  Problem Relation Age of Onset  . Diabetes Mother   . Hypertension Father   . Diabetes Sister     Social History   Tobacco Use  . Smoking status: Current Every Day Smoker    Packs/day: 1.00    Years: 19.00    Pack years: 19.00    Types: Cigarettes  . Smokeless tobacco: Never Used  . Tobacco comment: smoking cessation information given  Substance Use Topics  . Alcohol use: No  . Drug use: No    No current facility-administered medications for this encounter.   Current Outpatient Medications:  .  diclofenac (VOLTAREN) 75 MG EC tablet, Take 1 tablet (75 mg total) by mouth 2 (two) times daily. Take with food, Disp: 30 tablet, Rfl: 0 .  methocarbamol (ROBAXIN) 750 MG  tablet, Take 1 tablet (750 mg total) by mouth every 4 (four) hours., Disp: 40 tablet, Rfl: 0 .  traMADol (ULTRAM) 50 MG tablet, Take 1 tablet (50 mg total) by mouth every 6 (six) hours as needed for moderate pain or severe pain., Disp: 12 tablet, Rfl: 0  No Known Allergies   ROS  As noted in HPI.   Physical Exam  BP 110/68   Pulse 80   Temp 98.3 F (36.8 C)   Resp 16   LMP 04/11/2012   SpO2 100%   Constitutional: Well developed, well nourished, no acute distress.  Obese Eyes:  EOMI, conjunctiva normal bilaterally HENT: Normocephalic, atraumatic,mucus membranes moist Respiratory:  Normal inspiratory effort Breasts: Breasts symmetric, pendulous.  +0.5-1 cm mobile tender mass in the right upper quadrant, positive tenderness in the right upper quadrant and left lower quadrant of her breast.  No skin changes.  No erythema, induration.  No expressible nipple drainage.  Family present in room during exam. Lymph: No axillary lymphadenopathy Cardiovascular: Normal rate GI: nondistended skin: No rash, skin intact Musculoskeletal: Diffuse tenderness over the left pectoral muscle, trapezius. L shoulder with ROM normal , Drop test normal, clavicle NT, A/C joint tender, scapula NT, proximal humerus NT, Motor strength normal, Sensation intact LT over deltoid region, distal NVI with hand on affected side having grossly intact sensation and strength in the distribution of the median, radial, and ulnar nerve.  no pain with internal rotation, no pain with external rotation, negative tenderness in bicipital groove, positive empty can test, positive liftoff test, no instability with abduction/external rotation.  Neurologic: Alert & oriented x 3, no focal neuro deficits Psychiatric: Speech and behavior appropriate   ED Course   Medications - No data to display  No orders of the defined types were placed in this encounter.   No results found for this or any previous visit (from the past 24 hour(s)). No results found.  ED Clinical Impression  Breast mass  Acute pain of left shoulder  Breast tenderness   ED Assessment/Plan  Last night's ER records reviewed-referral to the breast center ordered, scheduled for 12/1.  She was discharged home with tramadol.  1.  Breast pain.  Patient has tenderness in the right upper quadrant in and left lower quadrant.  She has a mobile 0.5-1 cm mass in the right upper quadrant.  No nipple discharge.  No evidence of infection.  Patient needs a mammogram.  Put in another referral to the breast center.  We will have this faxed over to the breast  center.  They will contact the patient to set up an appointment.  Requesting results to be sent to [facility] with whom she is establishing care on 12/14.  Discontinue Naprosyn.  Start diclofenac 75 mg 1 g of Tylenol twice a day.  2: Shoulder pain.  Suspect rotator cuff impingement versus overuse.  Do not think plain films would be beneficial at this point time.  Doubt fracture, dislocation.  Patient to sleep with a pillow between her body and arm at night.  Pain medicine as above, Robaxin.  If not better in 2 weeks when she follows up with her PMD, will have her follow-up with Dr. Erlinda Hong,  orthopedic surgeon on call.  Discussed MDM, plan and followup with patient.patient agrees with plan.   Meds ordered this encounter  Medications  . diclofenac (VOLTAREN) 75 MG EC tablet    Sig: Take 1 tablet (75 mg total) by mouth 2 (two) times daily. Take  with food    Dispense:  30 tablet    Refill:  0  . methocarbamol (ROBAXIN) 750 MG tablet    Sig: Take 1 tablet (750 mg total) by mouth every 4 (four) hours.    Dispense:  40 tablet    Refill:  0    *This clinic note was created using [employer]. Therefore, there may be occasional mistakes despite careful proofreading.   ?    Melynda Ripple, MD 06/04/17 2050

## 2017-06-04 NOTE — ED Triage Notes (Signed)
Pt c/o pain in her L breast and shoulder and side x1 month. Was seen at [facility] for the same reason.

## 2017-06-06 ENCOUNTER — Other Ambulatory Visit: Payer: Self-pay

## 2017-06-06 ENCOUNTER — Encounter (HOSPITAL_COMMUNITY): Payer: Self-pay | Admitting: *Deleted

## 2017-06-06 ENCOUNTER — Ambulatory Visit (HOSPITAL_COMMUNITY)
Admission: EM | Admit: 2017-06-06 | Discharge: 2017-06-06 | Disposition: A | Discharge status: Home / Self Care | Payer: BLUE CROSS/BLUE SHIELD | Attending: Surgery | Admitting: Surgery

## 2017-06-06 DIAGNOSIS — Z9889 Other specified postprocedural states: Secondary | ICD-10-CM | POA: Insufficient documentation

## 2017-06-06 DIAGNOSIS — Z90710 Acquired absence of both cervix and uterus: Secondary | ICD-10-CM | POA: Diagnosis not present

## 2017-06-06 DIAGNOSIS — J029 Acute pharyngitis, unspecified: Secondary | ICD-10-CM | POA: Insufficient documentation

## 2017-06-06 DIAGNOSIS — F17210 Nicotine dependence, cigarettes, uncomplicated: Secondary | ICD-10-CM | POA: Diagnosis not present

## 2017-06-06 DIAGNOSIS — Z9851 Tubal ligation status: Secondary | ICD-10-CM | POA: Diagnosis not present

## 2017-06-06 LAB — POCT RAPID STREP A: STREPTOCOCCUS, GROUP A SCREEN (DIRECT): NEGATIVE

## 2017-06-06 NOTE — ED Triage Notes (Signed)
C/O sore throat since yesterday with sensation of swelling.  Unsure if fevers; has been taking Tyl.

## 2017-06-06 NOTE — ED Provider Notes (Signed)
[place]    CSN: [id number] Arrival date & time: 06/06/17  1503     History   Chief Complaint Chief Complaint  Patient presents with  . Sore Throat    HPI April Ayala is a 41 y.o. female.   41 year-old female, presenting today complaining of sore throat. States that her symptoms started yesterday. She complains of a burning and swelling sensation. No nasal congestion or cough. No known recent sick contacts   The history is provided by the patient.  Sore Throat  This is a new problem. The current episode started yesterday. The problem occurs constantly. The problem has not changed since onset.Pertinent negatives include no chest pain, no abdominal pain, no headaches and no shortness of breath. Nothing aggravates the symptoms. Nothing relieves the symptoms. She has tried nothing for the symptoms. The treatment provided no relief.    Past Medical History:  Diagnosis Date  . Asthma    recent bronchitis-rescue inhaler  . 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 History of kidney stones     Patient Active Problem List   Diagnosis Date Noted  . Urinary frequency 05/11/2014  . Cervical cancer ([place]) 03/30/2012    Past Surgical History:  Procedure Laterality Date  . ABDOMINAL HYSTERECTOMY    . CERVICAL CONIZATION W/BX  03/24/2012   Procedure: CONIZATION CERVIX WITH BIOPSY;  Surgeon: Woodroe Mode, MD;  Location: [facility];  Service: Gynecology;  Laterality: N/A;  . DIAGNOSTIC LAPAROSCOPY  2007   ectopic  . ECTOPIC PREGNANCY SURGERY    . ESSURE TUBAL LIGATION  2010  . INSERTION OF SUPRAPUBIC CATHETER  05/03/2012   Procedure: INSERTION OF SUPRAPUBIC CATHETER;  Surgeon: Alvino Chapel, MD;  Location: [facility];  Service: Gynecology;;  . LEEP    . LYMPHADENECTOMY  05/03/2012   Procedure: LYMPHADENECTOMY;  Surgeon: Alvino Chapel, MD;  Location: [facility];  Service: Gynecology;  Laterality: Bilateral;  pelvic   . RADICAL  HYSTERECTOMY  05/03/2012   Procedure: RADICAL HYSTERECTOMY;  Surgeon: Alvino Chapel, MD;  Location: [facility];  Service: Gynecology;;  transposition of the ovaries    OB History    Gravida Para Term Preterm AB Living   4 4 4     4    SAB TAB Ectopic Multiple Live Births                   Home Medications    Prior to Admission medications   Medication Sig Start Date End Date Taking? Authorizing Provider  diclofenac (VOLTAREN) 75 MG EC tablet Take 1 tablet (75 mg total) by mouth 2 (two) times daily. Take with food 06/04/17  Yes Melynda Ripple, MD  methocarbamol (ROBAXIN) 750 MG tablet Take 1 tablet (750 mg total) by mouth every 4 (four) hours. 06/04/17  Yes Melynda Ripple, MD  traMADol (ULTRAM) 50 MG tablet Take 1 tablet (50 mg total) by mouth every 6 (six) hours as needed for moderate pain or severe pain. 06/03/17   Francine Graven, DO    Family History Family History  Problem Relation Age of Onset  . Diabetes Mother   . Hypertension Father   . Diabetes Sister     Social History Social History   Tobacco Use  . Smoking status: Current Every Day Smoker    Packs/day: 1.00    Years: 19.00    Pack years: 19.00    Types: Cigarettes  . Smokeless tobacco: Never Used  .  Tobacco comment: smoking cessation information given  Substance Use Topics  . Alcohol use: No  . Drug use: No     Allergies   Patient has no known allergies.   Review of Systems Review of Systems  Constitutional: Negative for chills and fever.  HENT: Positive for sore throat. Negative for ear pain.   Eyes: Negative for pain and visual disturbance.  Respiratory: Negative for cough and shortness of breath.   Cardiovascular: Negative for chest pain and palpitations.  Gastrointestinal: Negative for abdominal pain and vomiting.  Genitourinary: Negative for dysuria and hematuria.  Musculoskeletal: Negative for arthralgias and back pain.  Skin: Negative for color change and rash.    Neurological: Negative for seizures, syncope and headaches.  All other systems reviewed and are negative.    Physical Exam Triage Vital Signs ED Triage Vitals  Enc Vitals Group     BP 06/06/17 1635 111/85     Pulse Rate 06/06/17 1635 69     Resp 06/06/17 1635 16     Temp 06/06/17 1635 98.3 F (36.8 C)     Temp Source 06/06/17 1635 Oral     SpO2 06/06/17 1635 100 %     Weight --      Height --      Head Circumference --      Peak Flow --      Pain Score 06/06/17 1637 4     Pain Loc --      Pain Edu? --      Excl. in [place]? --    No data found.  Updated Vital Signs BP 111/85   Pulse 69   Temp 98.3 F (36.8 C) (Oral)   Resp 16   LMP 04/11/2012   SpO2 100%   Visual Acuity Right Eye Distance:   Left Eye Distance:   Bilateral Distance:    Right Eye Near:   Left Eye Near:    Bilateral Near:     Physical Exam  Constitutional: She appears well-developed and well-nourished. No distress.  HENT:  Head: Normocephalic and atraumatic.  Right Ear: Hearing, tympanic membrane, external ear and ear canal normal.  Left Ear: Hearing, tympanic membrane, external ear and ear canal normal.  Nose: Nose normal.  Mouth/Throat: Posterior oropharyngeal edema and posterior oropharyngeal erythema present. No oropharyngeal exudate or tonsillar abscesses.  Eyes: Conjunctivae are normal.  Neck: Neck supple.  Cardiovascular: Normal rate and regular rhythm.  No murmur heard. Pulmonary/Chest: Effort normal and breath sounds normal. No stridor. No respiratory distress. She has no wheezes. She has no rhonchi. She has no rales.  Abdominal: Soft. There is no tenderness.  Musculoskeletal: She exhibits no edema.  Neurological: She is alert.  Skin: Skin is warm and dry.  Psychiatric: She has a normal mood and affect.  Nursing note and vitals reviewed.    UC Treatments / Results  Labs (all labs ordered are listed, but only abnormal results are displayed) Labs Reviewed  CULTURE, GROUP A STREP  [facility])  POCT RAPID STREP A    EKG  EKG Interpretation None       Radiology No results found.  Procedures Procedures (including critical care time)  Medications Ordered in UC Medications - No data to display   Initial Impression / Assessment and Plan / UC Course  I have reviewed the triage vital signs and the nursing notes.  Pertinent labs & imaging results that were available during my care of the patient were reviewed by me and considered in my medical  decision making (see chart for details).     Negative strep - symptomatic treatment   Final Clinical Impressions(s) / UC Diagnoses   Final diagnoses:  Pharyngitis, unspecified etiology    ED Discharge Orders    None       Controlled Substance Prescriptions [place] Controlled Substance Registry consulted? Not Applicable   Phebe Colla, [place] 06/06/17 1715

## 2017-06-09 LAB — CULTURE, GROUP A STREP (THRC)

## 2017-06-10 ENCOUNTER — Other Ambulatory Visit: Payer: Self-pay

## 2017-06-10 DIAGNOSIS — N644 Mastodynia: Secondary | ICD-10-CM

## 2017-06-18 ENCOUNTER — Encounter: Payer: Self-pay | Admitting: Family Medicine

## 2017-06-18 ENCOUNTER — Ambulatory Visit (INDEPENDENT_AMBULATORY_CARE_PROVIDER_SITE_OTHER): Payer: BLUE CROSS/BLUE SHIELD | Admitting: Family Medicine

## 2017-06-18 ENCOUNTER — Other Ambulatory Visit: Payer: Self-pay

## 2017-06-18 VITALS — BP 122/78 | HR 82 | Resp 16

## 2017-06-18 DIAGNOSIS — C539 Malignant neoplasm of cervix uteri, unspecified: Secondary | ICD-10-CM | POA: Diagnosis not present

## 2017-06-18 DIAGNOSIS — M67912 Unspecified disorder of synovium and tendon, left shoulder: Secondary | ICD-10-CM | POA: Diagnosis not present

## 2017-06-18 DIAGNOSIS — N6320 Unspecified lump in the left breast, unspecified quadrant: Secondary | ICD-10-CM

## 2017-06-18 MED ORDER — MELOXICAM 7.5 MG PO TABS: 7.5000 mg | ORAL_TABLET | Freq: Every day | ORAL | 0 refills | Status: DC

## 2017-06-18 NOTE — Progress Notes (Signed)
Patient ID: April Ayala, female    DOB: 1976/02/11, 41 y.o.   MRN: [id number]  Chief Complaint  Patient presents with  . Shoulder Pain    left- a year    Allergies Patient has no known allergies.  Subjective:   April Ayala is a 41 y.o. female who presents to [facility] today.  HPI April Ayala presents today as a new patient to establish care.  She reports that she has recently gotten insurance and therefore is seeking care for some problems that have been bothering her for quite a while.  She reports that she has recently been seen at the urgent care clinic at [facility] for pain in the left shoulder.  She reports that she has had pain in her left shoulder for for a year.  She reports that she lifts heavy boxes, approximately 60 pounds each, a lot at work.  Reports that over the past couple months that the pain has worsened in quality and frequency.  Reports that she gets more pain when she moves shoulder up and around.  Pain is better with doing nothing or staying still.  Reports that the pain can be severe and even hurt to even touch shoulder.  Has been given Voltaren, tramadol, and Robaxin for pain at the urgent care clinic but reports it really did not improve her symptoms.  She also reports that 2-3 days prior to going to the urgent care clinic that she developed pain in left breast.  She reports that her left breast was tender to palpation.  There was no associated skin changes or nipple discharge.  She reports she was seen at the urgent care clinic and was told that she had a mass in her breast and that she needed to have a mammogram performed.  She reports that she tried to schedule the mammogram but was told that she had to have a referral from her primary care physician.  She presents today to get this referral.  She reports she has never had any problems or pain in her breast before.  No family history of breast cancer.  Reports that occasionally her breasts will  get tender associated with her cycles.  She does not have monthly bleeding secondary to the fact that she has had a hysterectomy due to cervical cancer.  Reports that she has not had yearly Pap smears because she has not had insurance.  Examination on 06/04/17 at the urgent care clinic revealed: Breasts: Breasts symmetric, pendulous.  +0.5-1 cm mobile tender mass in the right upper quadrant, positive tenderness in the right upper quadrant and left lower quadrant of her breast.  Patient reports that a couple days after being seen at the urgent care clinic that her pain in her breasts resolved.  She has not felt any lesions in her breast.  She denies any subsequent breast pain.  She reports that what has persisted is the pain in her left shoulder.  Patient denies any numbness and tingling in her upper or lower extremities.  She reports that she does not have any weakness in her upper and lower extremities.  She reports she just has pain in her left shoulder area which sometimes radiates to her left axilla.  Shoulder Pain   The pain is present in the left shoulder. This is a chronic problem. The current episode started more than 1 year ago. There has been no history of extremity trauma. The problem occurs constantly. The problem has been gradually  worsening. The pain is at a severity of 5/10. The pain is moderate. Associated symptoms include a limited range of motion. Pertinent negatives include no fever, joint locking, joint swelling, numbness, stiffness or tingling. The symptoms are aggravated by activity. She has tried acetaminophen, OTC pain meds, oral narcotics and rest for the symptoms. The treatment provided mild relief. Family history does not include gout or rheumatoid arthritis. There is no history of diabetes, gout or osteoarthritis.    Past Medical History:  Diagnosis Date  . Asthma    recent bronchitis-rescue inhaler  . 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 History of  kidney stones     Past Surgical History:  Procedure Laterality Date  . ABDOMINAL HYSTERECTOMY    . CERVICAL CONIZATION W/BX  03/24/2012   Procedure: CONIZATION CERVIX WITH BIOPSY;  Surgeon: Woodroe Mode, MD;  Location: [facility];  Service: Gynecology;  Laterality: N/A;  . DIAGNOSTIC LAPAROSCOPY  2007   ectopic  . ECTOPIC PREGNANCY SURGERY    . ESSURE TUBAL LIGATION  2010  . INSERTION OF SUPRAPUBIC CATHETER  05/03/2012   Procedure: INSERTION OF SUPRAPUBIC CATHETER;  Surgeon: Alvino Chapel, MD;  Location: [facility];  Service: Gynecology;;  . LEEP    . LYMPHADENECTOMY  05/03/2012   Procedure: LYMPHADENECTOMY;  Surgeon: Alvino Chapel, MD;  Location: [facility];  Service: Gynecology;  Laterality: Bilateral;  pelvic   . RADICAL HYSTERECTOMY  05/03/2012   Procedure: RADICAL HYSTERECTOMY;  Surgeon: Alvino Chapel, MD;  Location: [facility];  Service: Gynecology;;  transposition of the ovaries  . TUBAL LIGATION      Family History  Problem Relation Age of Onset  . Diabetes Mother   . Hypertension Father   . Diabetes Sister   . Hypothyroidism Sister      Social History   Socioeconomic History  . Marital status: Legally Separated    Spouse name: None  . Number of children: None  . Years of education: None  . Highest education level: None  Social Needs  . Financial resource strain: None  . Food insecurity - worry: None  . Food insecurity - inability: None  . Transportation needs - medical: None  . Transportation needs - non-medical: None  Occupational History    Employer: [employer]    Comment: Microbiologist  Tobacco Use  . Smoking status: Former Smoker    Packs/day: 1.00    Years: 19.00    Pack years: 19.00    Types: Cigarettes    Last attempt to quit: 05/19/2017    Years since quitting: 0.0  . Smokeless tobacco: Never Used  . Tobacco comment: smoking cessation information given  Substance and Sexual Activity  . Alcohol use: No  . Drug use: No    . Sexual activity: Yes    Birth control/protection: Surgical  Other Topics Concern  . None  Social History Narrative   Works as a Aeronautical engineer at [employer].   Lives in [place], [place].   Engaged to be married.    Married in the past and has four kids.   Has two new puppies.    Current Outpatient Medications on File Prior to Visit  Medication Sig Dispense Refill  . diclofenac (VOLTAREN) 75 MG EC tablet Take 1 tablet (75 mg total) by mouth 2 (two) times daily. Take with food (Patient not taking: Reported on 06/18/2017) 30 tablet 0  . methocarbamol (ROBAXIN) 750 MG tablet Take 1 tablet (750 mg  total) by mouth every 4 (four) hours. (Patient not taking: Reported on 06/18/2017) 40 tablet 0  . traMADol (ULTRAM) 50 MG tablet Take 1 tablet (50 mg total) by mouth every 6 (six) hours as needed for moderate pain or severe pain. (Patient not taking: Reported on 06/18/2017) 12 tablet 0   No current facility-administered medications on file prior to visit.      Review of Systems  Constitutional: Negative for activity change, appetite change and fever.  Eyes: Negative for visual disturbance.  Respiratory: Negative for cough, chest tightness and shortness of breath.   Cardiovascular: Negative for chest pain, palpitations and leg swelling.  Gastrointestinal: Negative for abdominal pain, constipation, diarrhea, nausea and vomiting.  Endocrine: Negative for polydipsia, polyphagia and polyuria.  Genitourinary: Negative for decreased urine volume, difficulty urinating, dyspareunia, dysuria, flank pain, frequency, hematuria, pelvic pain, urgency, vaginal bleeding, vaginal discharge and vaginal pain.  Musculoskeletal: Positive for arthralgias. Negative for back pain, gait problem, gout, joint swelling, neck pain, neck stiffness and stiffness.  Neurological: Negative for dizziness, tingling, syncope, light-headedness and numbness.  Hematological: Negative for adenopathy.  Psychiatric/Behavioral: Negative  for behavioral problems and dysphoric mood. The patient is not nervous/anxious.      Objective:   LMP 04/11/2012   Physical Exam  Constitutional: She is oriented to person, place, and time. She appears well-developed and well-nourished. No distress.  HENT:  Head: Normocephalic and atraumatic.  Eyes: Pupils are equal, round, and reactive to light.  Neck: Normal range of motion. Neck supple. No thyromegaly present.  Cardiovascular: Normal rate, regular rhythm and normal heart sounds.  Pulmonary/Chest: Effort normal and breath sounds normal. No respiratory distress. She exhibits no mass. Right breast exhibits no inverted nipple, no mass, no nipple discharge, no skin change and no tenderness. Left breast exhibits no inverted nipple, no mass, no nipple discharge, no skin change and no tenderness. Breasts are symmetrical. There is no breast swelling.  Left and right breasts with fatty changes.  No masses palpated bilaterally.  Breasts pendulous with stretch marks present.  No overlying skin abnormalities.  No pain with examination.  Genitourinary: No breast tenderness, discharge or bleeding.  Musculoskeletal:       Left shoulder: She exhibits decreased range of motion, tenderness and bony tenderness. She exhibits no swelling, no effusion, no crepitus, no deformity and normal strength.  Left shoulder pain reproduced with external rotation and internal rotation.  Negative Neer's and Hawkins test.  Strength 5 out of 5.  Tenderness to palpation over anterior shoulder joint.  Neurological: She is alert and oriented to person, place, and time. No cranial nerve deficit.  Skin: Skin is warm and dry.  Nursing note and vitals reviewed.    Assessment and Plan  1. Rotator cuff disorder, left Long discussion today with patient and her fianc.  Patient has had long-term shoulder pain with repetitive daily activity.  She has been on NSAIDs, muscle relaxers, and pain medication without improvement.  Will try  Mobic at this time and refer to orthopedics.  She wishes to try a steroid injection prior to obtaining MRI secondary to financial reasons.  Heat to the area 20 minutes 3 times daily as directed. - meloxicam (MOBIC) 7.5 MG tablet; Take 1 tablet (7.5 mg total) by mouth daily.  Dispense: 30 tablet; Refill: 0 - Ambulatory referral to Orthopedic Surgery  2. Malignant neoplasm of cervix, unspecified site [facility]) Patient has had a history of cervical cancer status post hysterectomy including lymph node resection.  She will follow-up for  her Pap pelvic and physical. Office visit and operative note from August/2016 reviewed. 3. Breast mass, left No breast lesions palpated today and pain has since resolved.  However secondary to exam by another physician concerning for breast mass we will go ahead and place the referral again for mammogram/diagnostic. - MM Digital Diagnostic Unilat L; Future  Patient recently quit smoking 1 month ago.  She was congratulated on her efforts and encouraged not to restart. Return in about 4 weeks (around 07/16/2017). Caren Macadam, MD 06/19/2017

## 2017-06-19 ENCOUNTER — Encounter: Payer: Self-pay | Admitting: Family Medicine

## 2017-06-21 ENCOUNTER — Telehealth: Payer: Self-pay | Admitting: Family Medicine

## 2017-06-24 ENCOUNTER — Telehealth: Payer: Self-pay | Admitting: Family Medicine

## 2017-06-24 DIAGNOSIS — M25512 Pain in left shoulder: Principal | ICD-10-CM

## 2017-06-24 DIAGNOSIS — G8929 Other chronic pain: Secondary | ICD-10-CM

## 2017-06-24 NOTE — Telephone Encounter (Signed)
Dr. Mannie Stabile, please see request below. Referral is pending a diagnosis and sign. I will take care of it if/after it is signed.

## 2017-06-24 NOTE — Telephone Encounter (Signed)
-----   Message from Eual Fines, LPN sent at [id number]/4037 10:50 AM EST ----- Regarding: FW: New referral   ----- Message ----- From: Wallis Bamberg Sent: 06/22/2017   9:39 AM To: Eual Fines, LPN Subject: RE: New referral                                ----- Message ----- From: Uvaldo Bristle Sent: 06/21/2017   3:44 PM To: Wallis Bamberg Subject: New referral                                   Rosaria Ferries, RE: RAFAELITA, FOISTER [[id number]]  Can clinical staff enter a new referral, as the patient's referral was closed for [place], as "patient refusal."  And for record keeping, best to leave that referral as it stands, and await a new one. Her appt is entered.    Thank you, Arbie Cookey

## 2017-06-25 NOTE — Telephone Encounter (Signed)
Done. April Ayala H. Mahir Prabhakar, MD  

## 2017-06-28 NOTE — Telephone Encounter (Signed)
closed

## 2017-07-01 ENCOUNTER — Telehealth: Payer: Self-pay | Admitting: Family Medicine

## 2017-07-01 DIAGNOSIS — N6320 Unspecified lump in the left breast, unspecified quadrant: Secondary | ICD-10-CM

## 2017-07-01 NOTE — Telephone Encounter (Signed)
Judson Roch calling with [facility] calling regarding a referral [phone] .[id number] ext [phone]

## 2017-07-02 ENCOUNTER — Telehealth: Payer: Self-pay | Admitting: Family Medicine

## 2017-07-02 NOTE — Telephone Encounter (Signed)
There is no order for the DX mammo, please complete an ORDER for pt --[facility] is [facility] [phone]. Ext [phone]

## 2017-07-02 NOTE — Telephone Encounter (Signed)
Left message with Judson Roch to return call

## 2017-07-02 NOTE — Telephone Encounter (Signed)
April Ayala returned call-they needed order for Mammo. Order sent

## 2017-07-03 ENCOUNTER — Other Ambulatory Visit: Payer: Self-pay | Admitting: Family Medicine

## 2017-07-03 DIAGNOSIS — N6320 Unspecified lump in the left breast, unspecified quadrant: Secondary | ICD-10-CM

## 2017-07-05 NOTE — Telephone Encounter (Signed)
Spoke to Judson Roch- the orders are in, they just need to be signed by PCP

## 2017-07-07 ENCOUNTER — Telehealth: Payer: Self-pay | Admitting: Family Medicine

## 2017-07-07 NOTE — Telephone Encounter (Signed)
Called patient regarding message below. No answer, left generic message for patient to return call.  Need to know which medication and how it is not working. Is she having side effects, etc?

## 2017-07-07 NOTE — Telephone Encounter (Signed)
Patient states that she is in a lot of pain, the medication was not helping the pain. Can not get into see Ortho until 8th. Has been put on wait list for sooner appointment but is having a lot of pain ad it is difficult to do her job. Is there anything you ca do to help until she sees ortho?  Okay to leave message on phone, per patient.

## 2017-07-07 NOTE — Telephone Encounter (Signed)
Pt is calling in and states that the RX Dr Mannie Stabile prescribed is not working, please call

## 2017-07-08 MED ORDER — MELOXICAM 15 MG PO TABS: 15.0000 mg | ORAL_TABLET | Freq: Every day | ORAL | 0 refills | Status: DC

## 2017-07-08 NOTE — Telephone Encounter (Signed)
Patient informed of message below, verbalized understanding.  

## 2017-07-08 NOTE — Telephone Encounter (Signed)
Please advise patient that I have called in a prescription for Mobic 15 mg 1 p.o. daily as needed pain.  This is an increase from her previous dose of Mobic.  Her appointment with orthopedics is less than 1 week away.  Please keep her follow-up visit with orthopedic surgery and use this medicine as needed.  Please advised to keep her scheduled follow-up visit in our office for her routine health maintenance/preventive care.

## 2017-07-12 ENCOUNTER — Other Ambulatory Visit: Payer: Self-pay | Admitting: Family Medicine

## 2017-07-12 DIAGNOSIS — N6320 Unspecified lump in the left breast, unspecified quadrant: Secondary | ICD-10-CM

## 2017-07-13 ENCOUNTER — Ambulatory Visit (INDEPENDENT_AMBULATORY_CARE_PROVIDER_SITE_OTHER): Payer: BLUE CROSS/BLUE SHIELD

## 2017-07-13 ENCOUNTER — Encounter: Payer: Self-pay | Admitting: Orthopedic Surgery

## 2017-07-13 ENCOUNTER — Ambulatory Visit (INDEPENDENT_AMBULATORY_CARE_PROVIDER_SITE_OTHER): Payer: BLUE CROSS/BLUE SHIELD | Admitting: Orthopedic Surgery

## 2017-07-13 VITALS — BP 134/80 | HR 100 | Ht 63.0 in | Wt 230.0 lb

## 2017-07-13 DIAGNOSIS — M7542 Impingement syndrome of left shoulder: Secondary | ICD-10-CM | POA: Diagnosis not present

## 2017-07-13 DIAGNOSIS — M25512 Pain in left shoulder: Secondary | ICD-10-CM

## 2017-07-13 MED ORDER — INDOMETHACIN 25 MG PO CAPS: 25.0000 mg | ORAL_CAPSULE | Freq: Two times a day (BID) | ORAL | 1 refills | Status: DC

## 2017-07-13 NOTE — Patient Instructions (Addendum)
Shoulder Impingement Syndrome Shoulder impingement syndrome is a condition that causes pain when connective tissues (tendons) surrounding the shoulder joint become pinched. These tendons are part of the group of muscles and tissues that help to stabilize the shoulder (rotator cuff). Beneath the rotator cuff is a fluid-filled sac (bursa) that allows the muscles and tendons to glide smoothly. The bursa may become swollen or irritated (bursitis). Bursitis, swelling in the rotator cuff tendons, or both conditions can decrease how much space is under a bone in the shoulder joint (acromion), resulting in impingement. What are the causes? Shoulder impingement syndrome can be caused by bursitis or swelling of the rotator cuff tendons, which may result from:  Repetitive overhead arm movements.  Falling onto the shoulder.  Weakness in the shoulder muscles.  What increases the risk? You may be more likely to develop this condition if you are an athlete who participates in:  Sports that involve throwing, such as baseball.  Tennis.  Swimming.  Volleyball.  Some people are also more likely to develop impingement syndrome because of the shape of their acromion bone. What are the signs or symptoms? The main symptom of this condition is pain on the front or side of the shoulder. Pain may:  Get worse when lifting or raising the arm.  Get worse at night.  Wake you up from sleeping.  Feel sharp when the shoulder is moved, and then fade to an ache.  Other signs and symptoms may include:  Tenderness.  Stiffness.  Inability to raise the arm above shoulder level or behind the body.  Weakness.  How is this diagnosed? This condition may be diagnosed based on:  Your symptoms.  Your medical history.  A physical exam.  Imaging tests, such as: ? X-rays. ? MRI. ? Ultrasound.  How is this treated? Treatment for this condition may include:  Resting your shoulder and avoiding all  activities that cause pain or put stress on the shoulder.  Icing your shoulder.  NSAIDs to help reduce pain and swelling.  One or more injections of medicines to numb the area and reduce inflammation.  Physical therapy.  Surgery. This may be needed if nonsurgical treatments have not helped. Surgery may involve repairing the rotator cuff, reshaping the acromion, or removing the bursa.  Follow these instructions at home: Managing pain, stiffness, and swelling  If directed, apply ice to the injured area. ? Put ice in a plastic bag. ? Place a towel between your skin and the bag. ? Leave the ice on for 20 minutes, 2-3 times a day. Activity  Rest and return to your normal activities as told by your health care provider. Ask your health care provider what activities are safe for you.  Do exercises as told by your health care provider. General instructions  Do not use any tobacco products, including cigarettes, chewing tobacco, or e-cigarettes. Tobacco can delay healing. If you need help quitting, ask your health care provider.  Ask your health care provider when it is safe for you to drive.  Take over-the-counter and prescription medicines only as told by your health care provider.  Keep all follow-up visits as told by your health care provider. This is important. How is this prevented?  Give your body time to rest between periods of activity.  Be safe and responsible while being active to avoid falls.  Maintain physical fitness, including strength and flexibility. Contact a health care provider if:  Your symptoms have not improved after 1-2 months of treatment and   rest.  You cannot lift your arm away from your body. This information is not intended to replace advice given to you by your health care provider. Make sure you discuss any questions you have with your health care provider. Document Released: 06/22/2005 Document Revised: 02/27/2016 Document Reviewed:  05/25/2015 Elsevier Interactive Patient Education  2018 [employer].   Treatment plan  Physical therapy Change meloxicam to new medication Inject left shoulder  You have received an injection of steroids into the joint. 15% of patients will have increased pain within the 24 hours postinjection.   This is transient and will go away.   We recommend that you use ice packs on the injection site for 20 minutes every 2 hours and extra strength Tylenol 2 tablets every 8 as needed until the pain resolves.  If you continue to have pain after taking the Tylenol and using the ice please call the office for further instructions.

## 2017-07-13 NOTE — Progress Notes (Signed)
Patient ID: April Ayala, female   DOB: 20-Sep-1975, 42 y.o.   MRN: [id number]  Chief Complaint  Patient presents with  . Shoulder Pain    left shoulder pain x 1year     HPI April Ayala is a 42 y.o. female.  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  She complains of a dull constant moderate to severe pain in the left shoulder in the peri-acromial and deltoid region which is nonradiating but associated with decreased range of motion and weakness in the left shoulder.  She has been treated with diclofenac muscle relaxers and Aloxi cam and has not improved.  She does report night pain.    Review of Systems Review of Systems  Constitutional: Negative for chills and fever.  Musculoskeletal: Negative for neck pain.  Skin: Negative.  Negative for rash.  Neurological: Negative for tingling and sensory change.    Past Medical History:  Diagnosis Date  . Asthma    recent bronchitis-rescue inhaler  . Cervical cancer ([place]) 04/2011   Stage 1B squamous cell  . History of kidney stones     Past Surgical History:  Procedure Laterality Date  . ABDOMINAL HYSTERECTOMY    . CERVICAL CONIZATION W/BX  03/24/2012   Procedure: CONIZATION CERVIX WITH BIOPSY;  Surgeon: Woodroe Mode, MD;  Location: [facility];  Service: Gynecology;  Laterality: N/A;  . DIAGNOSTIC LAPAROSCOPY  2007   ectopic  . ECTOPIC PREGNANCY SURGERY    . ESSURE TUBAL LIGATION  2010  . INSERTION OF SUPRAPUBIC CATHETER  05/03/2012   Procedure: INSERTION OF SUPRAPUBIC CATHETER;  Surgeon: Alvino Chapel, MD;  Location: [facility];  Service: Gynecology;;  . LEEP    . LYMPHADENECTOMY  05/03/2012   Procedure: LYMPHADENECTOMY;  Surgeon: Alvino Chapel, MD;  Location: [facility];  Service: Gynecology;  Laterality: Bilateral;  pelvic   . RADICAL HYSTERECTOMY  05/03/2012   Procedure:  RADICAL HYSTERECTOMY;  Surgeon: Alvino Chapel, MD;  Location: [facility];  Service: Gynecology;;  transposition of the ovaries  . TUBAL LIGATION      Family History  Problem Relation Age of Onset  . Diabetes Mother   . Hypertension Father   . Diabetes Sister   . Hypothyroidism Sister      Social History   Tobacco Use  . Smoking status: Former Smoker    Packs/day: 1.00    Years: 19.00    Pack years: 19.00    Types: Cigarettes    Last attempt to quit: 05/19/2017    Years since quitting: 0.1  . Smokeless tobacco: Never Used  . Tobacco comment: smoking cessation information given  Substance Use Topics  . Alcohol use: No  . Drug use: No    No Known Allergies    Current Meds  Medication Sig  . meloxicam (MOBIC) 7.5 MG tablet Take 7.5 mg by mouth daily.  . methocarbamol (ROBAXIN) 750 MG tablet Take 1 tablet (750 mg total) by mouth every 4 (four) hours.       Physical Exam BP 134/80   Pulse 100   Ht 5\' 3"  (1.6 m)   Wt 230 lb (104.3 kg)   LMP 04/11/2012   BMI 40.74 kg/m  Physical Exam  Constitutional: She is oriented to person, place, and time. She appears well-developed and well-nourished.  Neurological: She is alert and oriented to person, place, and time. Gait  normal.  Psychiatric: She has a normal mood and affect. Judgment normal.  Vitals reviewed.  Ambulatory status normal with no assistive devices Right Shoulder Exam   Tenderness  The patient is experiencing no tenderness.  Range of Motion  The patient has normal right shoulder ROM.  Muscle Strength  The patient has normal right shoulder strength.  Tests  Apprehension: negative  Other  Erythema: absent Sensation: normal Pulse: present   Left Shoulder Exam   Tenderness  Left shoulder tenderness location: deltoid   Range of Motion  Active abduction: abnormal  Passive abduction: normal  External rotation: normal  Forward flexion: abnormal   Muscle Strength  The patient has  normal left shoulder strength.  Tests  Apprehension: negative Hawkins test: negative Impingement: positive Drop arm: negative Sulcus: absent  Other  Erythema: absent Sensation: normal Pulse: present       Provocative test  drop arm test normal  Painful arc 90-120 Empty can wekness left supr spinatus  Jobst test neg Infraspinatus muscle strength test External rotation lag signnormal  Liftoff normal  Belly press test Supraspinatus motor exam mild weaness   Data Reviewed Imaging of the left shoulder are independently reviewed and I interpreted these as  Normal   Assessment  Encounter Diagnoses  Name Primary?  . Pain in joint of left shoulder   . Impingement syndrome of shoulder, left Yes   Procedure note the subacromial injection shoulder left   Verbal consent was obtained to inject the  Left   Shoulder  Timeout was completed to confirm the injection site is a subacromial space of the  left  shoulder  Medication used Depo-Medrol 40 mg and lidocaine 1% 3 cc  Anesthesia was provided by ethyl chloride  The injection was performed in the left  posterior subacromial space. After pinning the skin with alcohol and anesthetized the skin with ethyl chloride the subacromial space was injected using a 20-gauge needle. There were no complications  Sterile dressing was applied.   Plan  Injection PT New nsiad  6 weeks fu

## 2017-07-14 ENCOUNTER — Other Ambulatory Visit: Payer: Self-pay

## 2017-07-14 ENCOUNTER — Ambulatory Visit (HOSPITAL_COMMUNITY): Payer: BLUE CROSS/BLUE SHIELD | Attending: Orthopedic Surgery | Admitting: Occupational Therapy

## 2017-07-14 ENCOUNTER — Telehealth: Payer: Self-pay | Admitting: Orthopedic Surgery

## 2017-07-14 DIAGNOSIS — G8929 Other chronic pain: Secondary | ICD-10-CM | POA: Diagnosis present

## 2017-07-14 DIAGNOSIS — M25512 Pain in left shoulder: Secondary | ICD-10-CM | POA: Diagnosis present

## 2017-07-14 DIAGNOSIS — R29898 Other symptoms and signs involving the musculoskeletal system: Secondary | ICD-10-CM

## 2017-07-14 NOTE — Patient Instructions (Signed)
SHOULDER: Flexion On Table   Place hands on table, elbows straight. Move hips away from body. Press hands down into table. __15_ reps per set, _2-3__ sets per day  Abduction (Passive)   With arm out to side, resting on table, lower head toward arm, keeping trunk away from table.  Repeat __15__ times. Do __2-3__ sessions per day.  Copyright  VHI. All rights reserved.     Internal Rotation (Assistive)   Seated with elbow bent at right angle and held against side, slide arm on table surface in an inward arc. Repeat __15__ times. Do __2-3__ sessions per day. Activity: Use this motion to brush crumbs off the table.  Copyright  VHI. All rights reserved.    

## 2017-07-14 NOTE — Therapy (Signed)
[facility] [address] [place], [place], [address] Phone: [phone]   Fax:  [phone]  Occupational Therapy Evaluation  Patient Details  Name: April Ayala MRN: [id number] Date of Birth: Dec 30, 1975 Referring Provider: Dr. Arther Abbott   Encounter Date: 07/14/2017  OT End of Session - 07/14/17 1627    Visit Number  1    Number of Visits  8    Date for OT Re-Evaluation  08/13/17    Authorization Type  [employer]    OT Start Time  1553    OT Stop Time  1622    OT Time Calculation (min)  29 min    Activity Tolerance  Patient tolerated treatment well    Behavior During Therapy  [facility] for tasks assessed/performed       Past Medical History:  Diagnosis Date  . Asthma    recent bronchitis-rescue inhaler  . 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 History of kidney stones     Past Surgical History:  Procedure Laterality Date  . ABDOMINAL HYSTERECTOMY    . CERVICAL CONIZATION W/BX  03/24/2012   Procedure: CONIZATION CERVIX WITH BIOPSY;  Surgeon: Woodroe Mode, MD;  Location: [facility];  Service: Gynecology;  Laterality: N/A;  . DIAGNOSTIC LAPAROSCOPY  2007   ectopic  . ECTOPIC PREGNANCY SURGERY    . ESSURE TUBAL LIGATION  2010  . INSERTION OF SUPRAPUBIC CATHETER  05/03/2012   Procedure: INSERTION OF SUPRAPUBIC CATHETER;  Surgeon: Alvino Chapel, MD;  Location: [facility];  Service: Gynecology;;  . LEEP    . LYMPHADENECTOMY  05/03/2012   Procedure: LYMPHADENECTOMY;  Surgeon: Alvino Chapel, MD;  Location: [facility];  Service: Gynecology;  Laterality: Bilateral;  pelvic   . RADICAL HYSTERECTOMY  05/03/2012   Procedure: RADICAL HYSTERECTOMY;  Surgeon: Alvino Chapel, MD;  Location: [facility];  Service: Gynecology;;  transposition of the ovaries  . TUBAL LIGATION      There were no vitals filed for this visit.  Subjective Assessment - 07/14/17 1625    Subjective   S: It's been hurting for a  while but has been worse the last couple of months.     Pertinent History  Pt is a 42 y/o female presenting with shoulder pain limiting functional task completion at work and at home. Pt received cortisone shot on 07/13/17 and reports no improvement today. Dr. Arther Abbott referred pt to occupational therapy for evaluation and treatment.     Special Tests  FOTO Score: 41.5/100 (58.5% impairment)    Patient Stated Goals  To have less pain in my arm.    Currently in Pain?  Yes    Pain Score  8     Pain Location  Shoulder    Pain Orientation  Left    Pain Descriptors / Indicators  Aching;Sore    Pain Type  Chronic pain    Pain Radiating Towards  n/a    Pain Onset  More than a month ago    Pain Frequency  Constant    Aggravating Factors   movement, reaching    Pain Relieving Factors  heat    Effect of Pain on Daily Activities  mod effect on ADL completion    Multiple Pain Sites  No        [facility] OT Assessment - 07/14/17 1554      Assessment   Medical Diagnosis  Left shoulder pain    Referring Provider  Dr. Arther Abbott    Onset Date/Surgical Date  -- off and on for a year, worse in last 2 months    Hand Dominance  Right    Prior Therapy  None      Precautions   Precautions  None      Restrictions   Weight Bearing Restrictions  No      Balance Screen   Has the patient fallen in the past 6 months  No    Has the patient had a decrease in activity level because of a fear of falling?   No    Is the patient reluctant to leave their home because of a fear of falling?   No      Prior Function   Level of Independence  Independent    Vocation  Full time employment    Estate manager/land agent at [employer]      ADL   ADL comments  Pt has difficulty with dressing, grooming-washing and fixing hair, sleeping, lifting arm overhead, reaching up, and completing work tasks      Written Expression   Dominant Hand  Right      Cognition   Overall Cognitive Status   Within Functional Limits for tasks assessed      Observation/Other Assessments   Focus on Therapeutic Outcomes (FOTO)   41.5/100      ROM / Strength   AROM / PROM / Strength  AROM;PROM;Strength      Palpation   Palpation comment  Moderate fascial restrictions in left upper arm, trapezius, and scapularis regions       AROM   Overall AROM Comments  Assessed seated, er/IR adducted    AROM Assessment Site  Shoulder    Right/Left Shoulder  Left    Left Shoulder Flexion  98 Degrees    Left Shoulder ABduction  85 Degrees    Left Shoulder Internal Rotation  90 Degrees    Left Shoulder External Rotation  56 Degrees      PROM   Overall PROM Comments  Assessed supine, er/IR adducted    PROM Assessment Site  Shoulder    Right/Left Shoulder  Left    Left Shoulder Flexion  165 Degrees    Left Shoulder ABduction  180 Degrees    Left Shoulder Internal Rotation  90 Degrees    Left Shoulder External Rotation  74 Degrees      Strength   Overall Strength Comments  Assessed seated, er/IR adducted    Strength Assessment Site  Shoulder    Right/Left Shoulder  Left    Left Shoulder Flexion  3/5    Left Shoulder ABduction  3/5    Left Shoulder Internal Rotation  3/5    Left Shoulder External Rotation  3/5                      OT Education - 07/14/17 1615    Education provided  Yes    Education Details  table slides    Person(s) Educated  Patient    Methods  Explanation;Demonstration;Handout    Comprehension  Verbalized understanding;Returned demonstration       OT Short Term Goals - 07/14/17 1631      OT SHORT TERM GOAL #1   Title  Pt will be provided with and educated on HEP to improve LUE mobility during functional tasks.     Time  4    Period  Weeks    Status  New    Target Date  08/13/17      OT SHORT TERM GOAL #2   Title  Pt will decrease LUE pain to 5/10 or less to improve ability to sleep.     Time  4    Period  Weeks    Status  New      OT SHORT TERM GOAL  #3   Title  Pt will decrease LUE fascial restrictions to improve mobility required for functional reaching tasks.     Time  4    Period  Weeks    Status  New      OT SHORT TERM GOAL #4   Title  Pt will improve LUE A/ROM to WNL to improve ability to reach up and wash/fix hair.     Time  4    Period  Weeks    Status  New      OT SHORT TERM GOAL #5   Title  Pt will improve LUE strength to 4+/5 to improve ability to use LUE as non-dominant during work tasks.     Time  4    Period  Weeks    Status  New               Plan - 07/14/17 1628    Clinical Impression Statement  A: Pt is a 42 y/o female presenting with left shoulder pain for greater than a year which has been worsening over the past 2 months. Pt demonstrates full P/ROM however A/ROM is limited to 50% due to pain. Pt provided with table slides for HEP.     Occupational Profile and client history currently impacting functional performance  pt is independent and motivated to return to highest level of functioning.     Occupational performance deficits (Please refer to evaluation for details):  ADL's;IADL's;Rest and Sleep;Work;Leisure    Rehab Potential  Good    OT Frequency  2x / week    OT Duration  4 weeks    OT Treatment/Interventions  Self-care/ADL training;Ultrasound;Patient/family education;Passive range of motion;Cryotherapy;Electrical Stimulation;Therapeutic activities;Manual Therapy;Moist Heat;Therapeutic exercise    Plan  P: Pt will benefit from skilled OT services to decrease LUE pain and fascial restrictions and improve ROM, strength, and functional use during B/IADL tasks. Treatment plan: myofascial release, manual therapy, P/ROM, AA/ROM, A/ROM, general LUE strengthening, scapular stability and strengthening.     Clinical Decision Making  Limited treatment options, no task modification necessary    OT Home Exercise Plan  1/9: table slides    Consulted and Agree with Plan of Care  Patient       Patient will  benefit from skilled therapeutic intervention in order to improve the following deficits and impairments:  Decreased activity tolerance, Decreased strength, Decreased range of motion, Pain, Impaired UE functional use, Increased fascial restrictions  Visit Diagnosis: Chronic left shoulder pain - Plan: Ot plan of care cert/re-cert  Other symptoms and signs involving the musculoskeletal system - Plan: Ot plan of care cert/re-cert    Problem List Patient Active Problem List   Diagnosis Date Noted  . Urinary frequency 05/11/2014  . Cervical cancer [facility]) 03/30/2012   Guadelupe Sabin, OTR/L  [phone] 07/14/2017, 4:36 PM  [facility] [address] [place], [place], [address] Phone: [phone]   Fax:  [phone]  Name: JOHNNI WUNSCHEL MRN: [id number] Date of Birth: [birth year]

## 2017-07-14 NOTE — Telephone Encounter (Signed)
Patient is here asking if you would write her out of work until Monday 07/19/17. She states she just left PT and her shoulder is hurting her more.  Please advise

## 2017-07-15 ENCOUNTER — Encounter: Payer: Self-pay | Admitting: Orthopedic Surgery

## 2017-07-15 ENCOUNTER — Other Ambulatory Visit: Payer: Self-pay | Admitting: Family Medicine

## 2017-07-15 NOTE — Telephone Encounter (Signed)
Ok to provide work note from today until Jan 14th ?

## 2017-07-16 ENCOUNTER — Ambulatory Visit (HOSPITAL_COMMUNITY): Payer: BLUE CROSS/BLUE SHIELD

## 2017-07-16 ENCOUNTER — Telehealth (HOSPITAL_COMMUNITY): Payer: Self-pay

## 2017-07-16 ENCOUNTER — Encounter: Payer: Self-pay | Admitting: Orthopedic Surgery

## 2017-07-16 ENCOUNTER — Telehealth (HOSPITAL_COMMUNITY): Payer: Self-pay | Admitting: Family Medicine

## 2017-07-16 NOTE — Telephone Encounter (Signed)
07/16/17  I called and left a message to ask if patient could come in at 3:15 or 4:45 per Mickel Baas

## 2017-07-16 NOTE — Telephone Encounter (Signed)
Pt could not come in earlier or later and LE needed to leave early and will not be here at 4pm. Patient requested to r/s at the end at 4pm. NF

## 2017-07-16 NOTE — Telephone Encounter (Signed)
1/11 2:35 pt called stating she could not move her apptment time today. NF   1/11  1:48pm left a message to see if patient could come in at either 3:15 or 4:45 per Mickel Baas   MM

## 2017-07-16 NOTE — Telephone Encounter (Signed)
yes

## 2017-07-19 ENCOUNTER — Encounter: Payer: Self-pay | Admitting: Orthopedic Surgery

## 2017-07-20 ENCOUNTER — Other Ambulatory Visit: Payer: Self-pay

## 2017-07-20 ENCOUNTER — Encounter: Payer: Self-pay | Admitting: Family Medicine

## 2017-07-20 ENCOUNTER — Encounter (INDEPENDENT_AMBULATORY_CARE_PROVIDER_SITE_OTHER): Payer: Self-pay

## 2017-07-20 ENCOUNTER — Telehealth: Payer: Self-pay | Admitting: *Deleted

## 2017-07-20 ENCOUNTER — Ambulatory Visit (INDEPENDENT_AMBULATORY_CARE_PROVIDER_SITE_OTHER): Payer: BLUE CROSS/BLUE SHIELD | Admitting: Family Medicine

## 2017-07-20 VITALS — BP 116/80 | HR 85 | Temp 98.6°F | Resp 16 | Ht 63.0 in | Wt 219.8 lb

## 2017-07-20 DIAGNOSIS — G8929 Other chronic pain: Secondary | ICD-10-CM

## 2017-07-20 DIAGNOSIS — R739 Hyperglycemia, unspecified: Secondary | ICD-10-CM | POA: Diagnosis not present

## 2017-07-20 DIAGNOSIS — Z0000 Encounter for general adult medical examination without abnormal findings: Secondary | ICD-10-CM | POA: Diagnosis not present

## 2017-07-20 DIAGNOSIS — C539 Malignant neoplasm of cervix uteri, unspecified: Secondary | ICD-10-CM

## 2017-07-20 DIAGNOSIS — M25512 Pain in left shoulder: Secondary | ICD-10-CM

## 2017-07-20 DIAGNOSIS — Z720 Tobacco use: Secondary | ICD-10-CM | POA: Diagnosis not present

## 2017-07-20 DIAGNOSIS — Z23 Encounter for immunization: Secondary | ICD-10-CM

## 2017-07-20 DIAGNOSIS — Z113 Encounter for screening for infections with a predominantly sexual mode of transmission: Secondary | ICD-10-CM

## 2017-07-20 DIAGNOSIS — R5383 Other fatigue: Secondary | ICD-10-CM | POA: Diagnosis not present

## 2017-07-20 MED ORDER — METHOCARBAMOL 750 MG PO TABS: 750.0000 mg | ORAL_TABLET | Freq: Two times a day (BID) | ORAL | 0 refills | Status: DC | PRN

## 2017-07-20 MED ORDER — VARENICLINE TARTRATE 0.5 MG X 11 & 1 MG X 42 PO MISC: ORAL | 0 refills | Status: DC

## 2017-07-20 NOTE — Progress Notes (Signed)
Patient ID: April Ayala, female    DOB: [birth year], 42 y.o.   MRN: [id number]  Chief Complaint  Patient presents with  . Annual Exam    Allergies Patient has no known allergies.  Subjective:   April Ayala is a 42 y.o. female who presents to [name] today.  HPI April Ayala presents today for complete physical exam.  She is interested in discussing smoking cessation.  She is still being followed by orthopedics for left-sided shoulder pain.  She would like a refill on the muscle relaxers that have been given to her because she reports she gets spasms in her shoulder and it makes it difficult for her to sleep at night.  She has a history of cervical cancer status post hysterectomy and lymph node removal.  She did not follow-up with gynecology for subsequent examinations due to the fact that she did not have insurance.    Past Medical History:  Diagnosis Date  . Asthma    recent bronchitis-rescue inhaler  . 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 History of kidney stones     Past Surgical History:  Procedure Laterality Date  . ABDOMINAL HYSTERECTOMY    . CERVICAL CONIZATION W/BX  03/24/2012   Procedure: CONIZATION CERVIX WITH BIOPSY;  Surgeon: Woodroe Mode, MD;  Location: [facility];  Service: Gynecology;  Laterality: N/A;  . DIAGNOSTIC LAPAROSCOPY  2007   ectopic  . ECTOPIC PREGNANCY SURGERY    . ESSURE TUBAL LIGATION  2010  . INSERTION OF SUPRAPUBIC CATHETER  05/03/2012   Procedure: INSERTION OF SUPRAPUBIC CATHETER;  Surgeon: Alvino Chapel, MD;  Location: [facility];  Service: Gynecology;;  . LEEP    . LYMPHADENECTOMY  05/03/2012   Procedure: LYMPHADENECTOMY;  Surgeon: Alvino Chapel, MD;  Location: [facility];  Service: Gynecology;  Laterality: Bilateral;  pelvic   . RADICAL HYSTERECTOMY  05/03/2012   Procedure: RADICAL HYSTERECTOMY;  Surgeon: Alvino Chapel, MD;  Location: [facility];  Service: Gynecology;;   transposition of the ovaries  . TUBAL LIGATION      Family History  Problem Relation Age of Onset  . Diabetes Mother   . Hypertension Father   . Diabetes Sister   . Hypothyroidism Sister      Social History   Socioeconomic History  . Marital status: Legally Separated    Spouse name: None  . Number of children: None  . Years of education: None  . Highest education level: None  Social Needs  . Financial resource strain: None  . Food insecurity - worry: None  . Food insecurity - inability: None  . Transportation needs - medical: None  . Transportation needs - non-medical: None  Occupational History    Employer: [employer]    Comment: Microbiologist  Tobacco Use  . Smoking status: Former Smoker    Packs/day: 1.00    Years: 19.00    Pack years: 19.00    Types: Cigarettes    Last attempt to quit: 05/19/2017    Years since quitting: 0.1  . Smokeless tobacco: Never Used  . Tobacco comment: smoking cessation information given  Substance and Sexual Activity  . Alcohol use: Yes    Comment: occasionally  . Drug use: No  . Sexual activity: Yes    Birth control/protection: Surgical  Other Topics Concern  . None  Social History Narrative   Works as a Aeronautical engineer at [employer].   Lives in [place].   Engaged to be married.    Married in the past and has four kids.   Has two new puppies.     Review of Systems  Constitutional: Negative for activity change, appetite change and fever.  HENT: Negative for dental problem, hearing loss, nosebleeds, sinus pain, trouble swallowing and voice change.   Eyes: Negative for photophobia and visual disturbance.  Respiratory: Negative for cough, chest tightness and shortness of breath.   Cardiovascular: Negative for chest pain, palpitations and leg swelling.  Gastrointestinal: Negative for abdominal pain, nausea and vomiting.  Endocrine: Negative for polyphagia and polyuria.  Genitourinary: Negative for dysuria, frequency  and urgency.  Musculoskeletal:       Shoulder pain, currently being seen by orthopedics. Has been hard to sleep at night due to muscle spasms in shoulder.   Skin: Negative for rash.  Allergic/Immunologic: Negative for environmental allergies and food allergies.  Neurological: Negative for dizziness, syncope and light-headedness.  Hematological: Negative for adenopathy.  Psychiatric/Behavioral: Negative for behavioral problems, dysphoric mood and sleep disturbance.     Objective:   BP 116/80 (BP Location: Left Arm, Patient Position: Sitting, Cuff Size: Normal)   Pulse 85   Temp 98.6 F (37 C) (Temporal)   Resp 16   Ht 5\' 3"  (1.6 m)   Wt 219 lb 12 oz (99.7 kg)   LMP 04/11/2012   SpO2 99%   BMI 38.93 kg/m   Physical Exam  Constitutional: She is oriented to person, place, and time. She appears well-developed and well-nourished. No distress.  HENT:  Head: Normocephalic and atraumatic.  Right Ear: External ear normal.  Left Ear: External ear normal.  Nose: Nose normal.  Mouth/Throat: Oropharynx is clear and moist. No oropharyngeal exudate.  Eyes: Pupils are equal, round, and reactive to light.  Neck: Normal range of motion. Neck supple. No JVD present. No tracheal deviation present. No thyromegaly present.  Cardiovascular: Normal rate, regular rhythm, normal heart sounds and intact distal pulses.  Pulmonary/Chest: Effort normal and breath sounds normal. No respiratory distress. She has no wheezes. She has no rales. She exhibits no tenderness.  Abdominal: Soft. Bowel sounds are normal. She exhibits no distension and no mass. There is no tenderness. There is no guarding.  Musculoskeletal: She exhibits no edema.  Decreased ROM and tenderness to palpation in left shoulder.   Lymphadenopathy:    She has no cervical adenopathy.  Neurological: She is alert and oriented to person, place, and time. She displays normal reflexes. No cranial nerve deficit. She exhibits normal muscle tone.  Coordination normal.  Skin: Skin is warm and dry. Capillary refill takes less than 2 seconds.  Psychiatric: She has a normal mood and affect. Her behavior is normal. Judgment and thought content normal.  Nursing note and vitals reviewed.    Assessment and Plan  1. Well adult exam -Age-appropriate anticipatory guidance given and documented with patient.  She does have a scheduled diagnostic mammogram tomorrow. -We discussed routine health maintenance today but also discussed the fact that smoking and obesity were likely to cause her problems in the next several years if she is not Some lifestyle and behavioral changes to help combat these problems.  We discussed exercise and dietary changes.  She voiced understanding - CBC with Differential/Platelet - Basic metabolic panel - Lipid panel - Hepatic function panel  2. Tobacco abuse The 5 A's Model for treating Tobacco Use and Dependence was used today. I have identified and documented tobacco use status for this patient. I  have urged the patient to quit tobacco use. At this time, the patient is willing and ready to attempt to quit. We have discussed medication options to aid in smoking cessation including nicotine replacement therapy (NRT), zyban, and chantix. We have also discussed behavioral modifications, lifestyle changes, and patient support options to aid in tobacco cessation success. Patient was congratulated on their desire to make this positive change for their health. Patient instructed to keep their follow up to ensure success.   We discussed chantix for smoking cessation. Discussed that serious neuropsychiatric adverse events have been reported in patients treated with this medication, including changes in mood, psychosis, hallucinations, paranoia, delusion, homicidal ideation, aggression, hostility, agitation, anxiety, suicide ideation, suicide attempt, and completed suicide. Advised patient that if taking this medication and any symptoms  occur that the patient should STOP taking chantix and contact a healthcare provider via the ED or our office. Discussed that other common adverse reactions can occur with this medication such as nausea, abnormal dreams, constipation, gas, and vomiting. Patient told that upon starting medication that they should use caution when driving or operating machinery or engaging in hazardous activity until they know how this medication will affect them. Patient understands the risk of this medication and voiced understanding. Agrees to keep follow up appointments and follow the treatment plan.   - varenicline (CHANTIX STARTING MONTH PAK) 0.5 MG X 11 & 1 MG X 42 tablet; Take one 0.5 mg tablet by mouth once daily for 3 days, then increase to one 0.5 mg tablet twice daily for 4 days, then increase to one 1 mg tablet twice daily.  Dispense: 53 tablet; Refill: 0  3. Immunization due Patient will check and see if she has any immunization record and bring to next office visit.  Consider hepatitis vaccination. - Tdap vaccine greater than or equal to 7yo IM  4. Fatigue, unspecified type Check labs and follow up.   5. Screen for STD (sexually transmitted disease) Screening performed today. - Hepatitis panel, acute - HIV antibody - RPR  6. Hyperglycemia Rule out diabetes. The patient is asked to make an attempt to improve diet and exercise patterns to aid in medical management of this problem.  - Hemoglobin A1c  7. Chronic left shoulder pain Follow-up with orthopedics as directed. - methocarbamol (ROBAXIN) 750 MG tablet; Take 1 tablet (750 mg total) by mouth 2 (two) times daily as needed for muscle spasms.  Dispense: 40 tablet; Refill: 0 Patient counseled in detail regarding the risks of medication. Told to call or return to clinic if develop any worrisome signs or symptoms. Patient voiced understanding.  Sedation precautions of medication discussed.  She understands that she cannot take this medication if she  is driving, planning to drive or operating heavy machinery. 8. Malignant neoplasm of cervix, unspecified site [facility]) Referral placed for patient to follow-up with Dr. Fermin Schwab in gynecologic oncology.  Pending evaluation and surveillance plan I discussed with patient that I am fine due to her Pap and pelvic exams each year but would like for her to do a follow-up evaluation with gynecology first. - Ambulatory referral to Gynecology  Return in about 4 weeks (around 08/17/2017) for tobacco cessation. Caren Macadam, MD 07/21/2017

## 2017-07-20 NOTE — Telephone Encounter (Signed)
Patient called and scheduled a follow up appt for February 12th at 11:45am.

## 2017-07-21 ENCOUNTER — Ambulatory Visit (HOSPITAL_COMMUNITY): Payer: BLUE CROSS/BLUE SHIELD | Admitting: Occupational Therapy

## 2017-07-21 ENCOUNTER — Encounter (HOSPITAL_COMMUNITY): Payer: Self-pay | Admitting: Occupational Therapy

## 2017-07-21 ENCOUNTER — Other Ambulatory Visit: Payer: BLUE CROSS/BLUE SHIELD

## 2017-07-21 DIAGNOSIS — R29898 Other symptoms and signs involving the musculoskeletal system: Secondary | ICD-10-CM

## 2017-07-21 DIAGNOSIS — M25512 Pain in left shoulder: Principal | ICD-10-CM

## 2017-07-21 DIAGNOSIS — G8929 Other chronic pain: Secondary | ICD-10-CM

## 2017-07-21 LAB — LIPID PANEL
Cholesterol: 174 mg/dL (ref <200)
HDL: 52 mg/dL (ref >50)
LDL Cholesterol (Calc): 102 mg/dL (calc) — ABNORMAL HIGH
NON-HDL CHOLESTEROL (CALC): 122 mg/dL (ref <130)
Total CHOL/HDL Ratio: 3.3 (calc) (ref <5.0)
Triglycerides: 106 mg/dL (ref <150)

## 2017-07-21 LAB — BASIC METABOLIC PANEL
BUN: 19 mg/dL (ref 7–25)
CALCIUM: 9 mg/dL (ref 8.6–10.2)
CO2: 27 mmol/L (ref 20–32)
Chloride: 104 mmol/L (ref 98–110)
Creat: 0.94 mg/dL (ref 0.50–1.10)
Glucose, Bld: 102 mg/dL — ABNORMAL HIGH (ref 65–99)
Potassium: 4.2 mmol/L (ref 3.5–5.3)
SODIUM: 137 mmol/L (ref 135–146)

## 2017-07-21 LAB — CBC WITH DIFFERENTIAL/PLATELET
BASOS PCT: 0.7 %
Basophils Absolute: 85 cells/uL (ref 0–200)
EOS PCT: 1.6 %
Eosinophils Absolute: 195 cells/uL (ref 15–500)
HCT: 42.8 % (ref 35.0–45.0)
Hemoglobin: 15 g/dL (ref 11.7–15.5)
Lymphs Abs: 3709 cells/uL (ref 850–3900)
MCH: 30.5 pg (ref 27.0–33.0)
MCHC: 35 g/dL (ref 32.0–36.0)
MCV: 87.2 fL (ref 80.0–100.0)
MPV: 10.7 fL (ref 7.5–12.5)
Monocytes Relative: 6.6 %
Neutro Abs: 7405 cells/uL (ref 1500–7800)
Neutrophils Relative %: 60.7 %
PLATELETS: 367 10*3/uL (ref 140–400)
RBC: 4.91 10*6/uL (ref 3.80–5.10)
RDW: 12.9 % (ref 11.0–15.0)
TOTAL LYMPHOCYTE: 30.4 %
WBC mixed population: 805 cells/uL (ref 200–950)
WBC: 12.2 10*3/uL — ABNORMAL HIGH (ref 3.8–10.8)

## 2017-07-21 LAB — HEPATIC FUNCTION PANEL
AG Ratio: 1.7 (calc) (ref 1.0–2.5)
ALBUMIN MSPROF: 4.1 g/dL (ref 3.6–5.1)
ALKALINE PHOSPHATASE (APISO): 80 U/L (ref 33–115)
ALT: 39 U/L — AB (ref 6–29)
AST: 25 U/L (ref 10–30)
Bilirubin, Direct: 0.1 mg/dL (ref 0.0–0.2)
Globulin: 2.4 g/dL (calc) (ref 1.9–3.7)
Indirect Bilirubin: 0.2 mg/dL (calc) (ref 0.2–1.2)
Total Bilirubin: 0.3 mg/dL (ref 0.2–1.2)
Total Protein: 6.5 g/dL (ref 6.1–8.1)

## 2017-07-21 LAB — RPR: RPR: NONREACTIVE

## 2017-07-21 LAB — HEMOGLOBIN A1C
EAG (MMOL/L): 5.5 (calc)
HEMOGLOBIN A1C: 5.1 %{Hb} (ref <5.7)
Mean Plasma Glucose: 100 (calc)

## 2017-07-21 LAB — HEPATITIS PANEL, ACUTE
HEP A IGM: NONREACTIVE
HEP B C IGM: NONREACTIVE
HEP B S AG: NONREACTIVE
Hepatitis C Ab: NONREACTIVE
SIGNAL TO CUT-OFF: 0.01 (ref <1.00)

## 2017-07-21 LAB — HIV ANTIBODY (ROUTINE TESTING W REFLEX): HIV 1&2 Ab, 4th Generation: NONREACTIVE

## 2017-07-21 NOTE — Patient Instructions (Signed)

## 2017-07-21 NOTE — Therapy (Signed)
[place], [place], [address] Phone: [phone]   Fax:  [phone]  Occupational Therapy Treatment  Patient Details  Name: April Ayala MRN: [id number] Date of Birth: [birth year] Referring Provider: Dr. Arther Abbott   Encounter Date: 07/21/2017  OT End of Session - 07/21/17 1644    Visit Number  2    Number of Visits  8    Date for OT Re-Evaluation  08/13/17    Authorization Type  BCBS    OT Start Time  1601    OT Stop Time  1642    OT Time Calculation (min)  41 min    Activity Tolerance  Patient tolerated treatment well    Behavior During Therapy  [facility] for tasks assessed/performed       Past Medical History:  Diagnosis Date  . Asthma    recent bronchitis-rescue inhaler  . 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 History of kidney stones     Past Surgical History:  Procedure Laterality Date  . ABDOMINAL HYSTERECTOMY    . CERVICAL CONIZATION W/BX  03/24/2012   Procedure: CONIZATION CERVIX WITH BIOPSY;  Surgeon: Woodroe Mode, MD;  Location: [facility];  Service: Gynecology;  Laterality: N/A;  . DIAGNOSTIC LAPAROSCOPY  2007   ectopic  . ECTOPIC PREGNANCY SURGERY    . ESSURE TUBAL LIGATION  2010  . INSERTION OF SUPRAPUBIC CATHETER  05/03/2012   Procedure: INSERTION OF SUPRAPUBIC CATHETER;  Surgeon: Alvino Chapel, MD;  [facility];  Service: Gynecology;;  . LEEP    . LYMPHADENECTOMY  05/03/2012   Procedure: LYMPHADENECTOMY;  Surgeon: Alvino Chapel, MD;  Location: [facility];  Service: Gynecology;  Laterality: Bilateral;  pelvic   . RADICAL HYSTERECTOMY  05/03/2012   Procedure: RADICAL HYSTERECTOMY;  Surgeon: Alvino Chapel, MD;  Location: [facility];  Service: Gynecology;;  transposition of the ovaries  . TUBAL LIGATION      There were no vitals filed for this visit.  Subjective Assessment - 07/21/17 1602    Subjective   S: It's actually hurting  worse since having that shot    Currently in Pain?  Yes    Pain Score  5     Pain Location  Shoulder    Pain Orientation  Left    Pain Descriptors / Indicators  Aching;Sore    Pain Type  Chronic pain    Pain Radiating Towards  n/a    Pain Onset  More than a month ago    Pain Frequency  Constant    Aggravating Factors   movement, reaching    Pain Relieving Factors  heat    Effect of Pain on Daily Activities  mod effect on ADL completion    Multiple Pain Sites  No         [facility] OT Assessment - 07/21/17 1601      Assessment   Medical Diagnosis  Left shoulder pain      Precautions   Precautions  None               OT Treatments/Exercises (OP) - 07/21/17 1604      Exercises   Exercises  Shoulder      Shoulder Exercises: Supine   Protraction  PROM;AROM;10 reps    Horizontal ABduction  PROM;AROM;10 reps    External Rotation  PROM;AROM;10 reps    Internal Rotation  PROM;AROM;10 reps    Flexion  PROM;AROM;10  reps    ABduction  PROM;AROM;10 reps      Shoulder Exercises: Standing   Protraction  AROM;10 reps    Horizontal ABduction  AROM;10 reps    External Rotation  AROM;10 reps    Internal Rotation  AROM;10 reps    Flexion  AROM;10 reps    ABduction  AROM;10 reps    Extension  Theraband;10 reps    Theraband Level (Shoulder Extension)  Level 2 (Red)    Row  Theraband;10 reps    Theraband Level (Shoulder Row)  Level 2 (Red)    Retraction  Theraband;10 reps    Theraband Level (Shoulder Retraction)  Level 2 (Red)      Shoulder Exercises: ROM/Strengthening   UBE (Upper Arm Bike)  Level 1 2' forward 2' reverse    Proximal Shoulder Strengthening, Supine  10X each no rest breaks    Prot/Ret//Elev/Dep  1'      Manual Therapy   Manual Therapy  Myofascial release    Manual therapy comments  completed separately from therapeutic exercises    Myofascial Release  Myofascial release to left upper arm, trapezius, and scapularis regions to decrease pain and fascial  restrictions and increase joint range of motion             OT Education - 07/21/17 1632    Education provided  Yes    Education Details  shoulder a/rom    Person(s) Educated  Patient    Methods  Explanation;Demonstration;Handout    Comprehension  Verbalized understanding;Returned demonstration       OT Short Term Goals - 07/14/17 1631      OT SHORT TERM GOAL #1   Title  Pt will be provided with and educated on HEP to improve LUE mobility during functional tasks.     Time  4    Period  Weeks    Status  New    Target Date  08/13/17      OT SHORT TERM GOAL #2   Title  Pt will decrease LUE pain to 5/10 or less to improve ability to sleep.     Time  4    Period  Weeks    Status  New      OT SHORT TERM GOAL #3   Title  Pt will decrease LUE fascial restrictions to improve mobility required for functional reaching tasks.     Time  4    Period  Weeks    Status  New      OT SHORT TERM GOAL #4   Title  Pt will improve LUE A/ROM to WNL to improve ability to reach up and wash/fix hair.     Time  4    Period  Weeks    Status  New      OT SHORT TERM GOAL #5   Title  Pt will improve LUE strength to 4+/5 to improve ability to use LUE as non-dominant during work tasks.     Time  4    Period  Weeks    Status  New               Plan - 07/21/17 1644    Clinical Impression Statement  A: Initiated myofascial release, manual therapy, P/ROM, A/ROM, and scapular theraband exercises this session. Pt demonstrating ROM WNL today, muscle knot palpated at medial border of scapula. Verbal cuing for form and technique during exercises, HEP updated for A/ROM.     Plan  P: Add x to v  arms, follow up on HEP       Patient will benefit from skilled therapeutic intervention in order to improve the following deficits and impairments:  Decreased activity tolerance, Decreased strength, Decreased range of motion, Pain, Impaired UE functional use, Increased fascial restrictions  Visit  Diagnosis: Chronic left shoulder pain  Other symptoms and signs involving the musculoskeletal system    Problem List Patient Active Problem List   Diagnosis Date Noted  . Urinary frequency 05/11/2014  . Cervical cancer [facility]) 03/30/2012   Guadelupe Sabin, OTR/L  [phone] 07/21/2017, 5:36 PM  [facility] [address] [place], [place], [address] Phone: [phone]   Fax:  [phone]  Name: April Ayala MRN: [id number] Date of Birth: October 06, 1975

## 2017-07-22 ENCOUNTER — Telehealth: Payer: Self-pay | Admitting: *Deleted

## 2017-07-22 ENCOUNTER — Encounter: Payer: Self-pay | Admitting: Family Medicine

## 2017-07-22 NOTE — Telephone Encounter (Signed)
Returned the patient's call and moved her appt from 2/12 to 2/8

## 2017-07-23 ENCOUNTER — Ambulatory Visit (HOSPITAL_COMMUNITY): Payer: BLUE CROSS/BLUE SHIELD

## 2017-07-23 ENCOUNTER — Telehealth (HOSPITAL_COMMUNITY): Payer: Self-pay | Admitting: Family Medicine

## 2017-07-23 NOTE — Telephone Encounter (Signed)
07/23/17  pt called to cx and asked if it would be added to the end of her schedule

## 2017-07-28 ENCOUNTER — Ambulatory Visit (HOSPITAL_COMMUNITY): Payer: BLUE CROSS/BLUE SHIELD | Admitting: Occupational Therapy

## 2017-07-29 ENCOUNTER — Encounter (HOSPITAL_COMMUNITY): Payer: BLUE CROSS/BLUE SHIELD | Admitting: Occupational Therapy

## 2017-07-30 ENCOUNTER — Ambulatory Visit
Admission: RE | Admit: 2017-07-30 | Discharge: 2017-07-30 | Disposition: A | Discharge status: Home / Self Care | Payer: BLUE CROSS/BLUE SHIELD | Source: Ambulatory Visit | Attending: Family Medicine | Admitting: Family Medicine

## 2017-07-30 ENCOUNTER — Other Ambulatory Visit: Payer: Self-pay | Admitting: Family Medicine

## 2017-07-30 ENCOUNTER — Ambulatory Visit (HOSPITAL_COMMUNITY): Payer: BLUE CROSS/BLUE SHIELD | Admitting: Occupational Therapy

## 2017-07-30 DIAGNOSIS — N6320 Unspecified lump in the left breast, unspecified quadrant: Secondary | ICD-10-CM

## 2017-08-04 ENCOUNTER — Ambulatory Visit (HOSPITAL_COMMUNITY): Payer: BLUE CROSS/BLUE SHIELD | Admitting: Occupational Therapy

## 2017-08-04 ENCOUNTER — Encounter (HOSPITAL_COMMUNITY): Payer: Self-pay | Admitting: Occupational Therapy

## 2017-08-04 DIAGNOSIS — M25512 Pain in left shoulder: Secondary | ICD-10-CM | POA: Diagnosis not present

## 2017-08-04 DIAGNOSIS — G8929 Other chronic pain: Secondary | ICD-10-CM

## 2017-08-04 DIAGNOSIS — R29898 Other symptoms and signs involving the musculoskeletal system: Secondary | ICD-10-CM

## 2017-08-04 NOTE — Therapy (Addendum)
[place], [place], [address] Phone: [phone]   Fax:  [phone]  Occupational Therapy Treatment and Discharge  Patient Details  Name: April Ayala MRN: [id number] Date of Birth: March 26, 1976 Referring Provider: Dr. Arther Abbott   Encounter Date: 08/04/2017  OT End of Session - 08/04/17 1549    Visit Number  3    Number of Visits  8    Date for OT Re-Evaluation  08/13/17    Authorization Type  BCBS    OT Start Time  1545    OT Stop Time  1631    OT Time Calculation (min)  46 min    Activity Tolerance  Patient tolerated treatment well    Behavior During Therapy  [facility] for tasks assessed/performed       Past Medical History:  Diagnosis Date  . Asthma    recent bronchitis-rescue inhaler  . 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 History of kidney stones     Past Surgical History:  Procedure Laterality Date  . ABDOMINAL HYSTERECTOMY    . BREAST BIOPSY    . CERVICAL CONIZATION W/BX  03/24/2012   Procedure: CONIZATION CERVIX WITH BIOPSY;  Surgeon: Woodroe Mode, MD;  Location: [facility];  Service: Gynecology;  Laterality: N/A;  . DIAGNOSTIC LAPAROSCOPY  2007   ectopic  . ECTOPIC PREGNANCY SURGERY    . ESSURE TUBAL LIGATION  2010  . INSERTION OF SUPRAPUBIC CATHETER  05/03/2012   Procedure: INSERTION OF SUPRAPUBIC CATHETER;  Surgeon: Alvino Chapel, MD;  [facility];  Service: Gynecology;;  . LEEP    . LYMPHADENECTOMY  05/03/2012   Procedure: LYMPHADENECTOMY;  Surgeon: Alvino Chapel, MD;  [facility];  Service: Gynecology;  Laterality: Bilateral;  pelvic   . RADICAL HYSTERECTOMY  05/03/2012   Procedure: RADICAL HYSTERECTOMY;  Surgeon: Alvino Chapel, MD;  [facility];  Service: Gynecology;;  transposition of the ovaries  . TUBAL LIGATION      There were no vitals filed for this visit.  Subjective Assessment - 08/04/17 1545     Subjective   S: I've been doing those exercises at home.     Currently in Pain?  Yes    Pain Score  8     Pain Location  Shoulder    Pain Orientation  Left    Pain Descriptors / Indicators  Aching;Sore    Pain Type  Chronic pain    Pain Radiating Towards  n/a    Pain Onset  More than a month ago    Pain Frequency  Constant    Aggravating Factors   movement, reaching    Pain Relieving Factors  heat    Effect of Pain on Daily Activities  mod effect ADL completion    Multiple Pain Sites  No         [facility] OT Assessment - 08/04/17 1545      Assessment   Medical Diagnosis  Left shoulder pain      Precautions   Precautions  None               OT Treatments/Exercises (OP) - 08/04/17 1545      Exercises   Exercises  Shoulder      Shoulder Exercises: Supine   Protraction  PROM;5 reps    Horizontal ABduction  PROM;5 reps    External Rotation  PROM;5 reps    Internal Rotation  PROM;5  reps    Flexion  PROM;5 reps    ABduction  PROM;5 reps      Shoulder Exercises: Standing   Protraction  AROM;12 reps    Horizontal ABduction  AROM;12 reps    External Rotation  AROM;12 reps abducted    Internal Rotation  AROM;12 reps abducted    Flexion  AROM;12 reps    ABduction  AROM;12 reps    Extension  Theraband;10 reps    Theraband Level (Shoulder Extension)  Level 2 (Red)    Row  Theraband;10 reps    Theraband Level (Shoulder Row)  Level 2 (Red)    Retraction  Theraband;10 reps    Theraband Level (Shoulder Retraction)  Level 2 (Red)      Shoulder Exercises: ROM/Strengthening   X to V Arms  10X    Proximal Shoulder Strengthening, Seated  10X each no rest breaks      Modalities   Modalities  Electrical Stimulation      Electrical Stimulation   Electrical Stimulation Location  Left Shoulder    Electrical Stimulation Action  Interferential    Electrical Stimulation Parameters  11.0 CV    Electrical Stimulation Goals  Pain      Manual Therapy   Manual Therapy  Myofascial  release    Manual therapy comments  completed separately from therapeutic exercises    Myofascial Release  Myofascial release to left upper arm, trapezius, and scapularis regions to decrease pain and fascial restrictions and increase joint range of motion             OT Education - 08/04/17 1606    Education provided  Yes    Education Details  scapular theraband-red; provided information on purchasing TENS unit    Person(s) Educated  Patient    Methods  Explanation;Demonstration;Handout    Comprehension  Verbalized understanding;Returned demonstration       OT Short Term Goals - 07/14/17 1631      OT SHORT TERM GOAL #1   Title  Pt will be provided with and educated on HEP to improve LUE mobility during functional tasks.     Time  4    Period  Weeks    Status  New    Target Date  08/13/17      OT SHORT TERM GOAL #2   Title  Pt will decrease LUE pain to 5/10 or less to improve ability to sleep.     Time  4    Period  Weeks    Status  New      OT SHORT TERM GOAL #3   Title  Pt will decrease LUE fascial restrictions to improve mobility required for functional reaching tasks.     Time  4    Period  Weeks    Status  New      OT SHORT TERM GOAL #4   Title  Pt will improve LUE A/ROM to WNL to improve ability to reach up and wash/fix hair.     Time  4    Period  Weeks    Status  New      OT SHORT TERM GOAL #5   Title  Pt will improve LUE strength to 4+/5 to improve ability to use LUE as non-dominant during work tasks.     Time  4    Period  Weeks    Status  New               Plan - 08/04/17 1619  Clinical Impression Statement  A: Pt reporting increased pain this session from a combination of working and moving. Pt is completing A/ROM HEP without difficulty. Pt continues to have increased fascial restrictions in left upper arm, deltoid, trapezius, and scapularis regions. Pt is now reporting she is unable to sleep well due to the pain. During exercises pt  completing with good form and discomfort, no increases in pain however. ES applied at end of session for pain management. Pt reports pain decreased after using ES unit.     Plan  P: Follow up on TENS unit and pain, follow up on HEP. Continue with A/ROM, add theraband strengthening    Consulted and Agree with Plan of Care  Patient       Patient will benefit from skilled therapeutic intervention in order to improve the following deficits and impairments:  Decreased activity tolerance, Decreased strength, Decreased range of motion, Pain, Impaired UE functional use, Increased fascial restrictions  Visit Diagnosis: Chronic left shoulder pain  Other symptoms and signs involving the musculoskeletal system    Problem List Patient Active Problem List   Diagnosis Date Noted  . Urinary frequency 05/11/2014  . Cervical cancer [facility]) 03/30/2012   Guadelupe Sabin, OTR/L  [phone] 08/04/2017, 4:37 PM  [facility] [address] [place], [place], [address] Phone: [phone]   Fax:  [phone]  Name: April Ayala MRN: [id number] Date of Birth: 1976-03-04     08/18/2017  OCCUPATIONAL THERAPY DISCHARGE SUMMARY  Visits from Start of Care: 3  Current functional level related to goals / functional outcomes: Unknown. Pt has not returned since last visit on 08/04/17, discharging due to 3 no-shows.    Remaining deficits: unknown   Education / Equipment: HEP Plan: Patient agrees to discharge.  Patient goals were not met. Patient is being discharged due to not returning since the last visit.  ?????

## 2017-08-04 NOTE — Patient Instructions (Signed)

## 2017-08-05 ENCOUNTER — Other Ambulatory Visit: Payer: Self-pay | Admitting: Family Medicine

## 2017-08-05 DIAGNOSIS — Z720 Tobacco use: Secondary | ICD-10-CM

## 2017-08-06 ENCOUNTER — Encounter: Payer: Self-pay | Admitting: Orthopedic Surgery

## 2017-08-06 ENCOUNTER — Ambulatory Visit (HOSPITAL_COMMUNITY): Payer: BLUE CROSS/BLUE SHIELD | Attending: Orthopedic Surgery | Admitting: Occupational Therapy

## 2017-08-06 ENCOUNTER — Ambulatory Visit (INDEPENDENT_AMBULATORY_CARE_PROVIDER_SITE_OTHER): Payer: BLUE CROSS/BLUE SHIELD | Admitting: Orthopedic Surgery

## 2017-08-06 ENCOUNTER — Other Ambulatory Visit: Payer: Self-pay | Admitting: Family Medicine

## 2017-08-06 VITALS — BP 120/77 | HR 73 | Ht 63.0 in | Wt 216.0 lb

## 2017-08-06 DIAGNOSIS — M75112 Incomplete rotator cuff tear or rupture of left shoulder, not specified as traumatic: Secondary | ICD-10-CM | POA: Diagnosis not present

## 2017-08-06 DIAGNOSIS — M7542 Impingement syndrome of left shoulder: Secondary | ICD-10-CM

## 2017-08-06 DIAGNOSIS — G8929 Other chronic pain: Secondary | ICD-10-CM

## 2017-08-06 DIAGNOSIS — M25512 Pain in left shoulder: Secondary | ICD-10-CM

## 2017-08-06 NOTE — Addendum Note (Signed)
Addended byCandice Camp on: 08/06/2017 08:59 AM   Modules accepted: Orders

## 2017-08-06 NOTE — Progress Notes (Signed)
Progress Note   Patient ID: April Ayala, female   DOB: Aug 29, 1975, 42 y.o.   MRN: [id number]  Chief Complaint  Patient presents with  . Shoulder Pain    left    HPI April Ayala is a 42 y.o. female.  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   She complains of a dull constant moderate to severe pain in the left shoulder in the peri-acromial and deltoid region which is nonradiating but associated with decreased range of motion and weakness in the left shoulder.  She has been treated with diclofenac muscle relaxers and Aloxi cam and has not improved.  She does report night pain.   Physical therapy was started as well, injection was given and anti-inflammatory Indocin 25 mg was tried no improvement in patient's symptoms     Review of Systems  Constitutional: Negative for fever.  Skin: Negative.   Neurological: Negative for tingling and sensory change.   Current Meds  Medication Sig  . indomethacin (INDOCIN) 25 MG capsule Take 1 capsule (25 mg total) by mouth 2 (two) times daily with a meal.  . methocarbamol (ROBAXIN) 750 MG tablet Take 1 tablet (750 mg total) by mouth 2 (two) times daily as needed for muscle spasms.  . varenicline (CHANTIX STARTING MONTH PAK) 0.5 MG X 11 & 1 MG X 42 tablet Take one 0.5 mg tablet by mouth once daily for 3 days, then increase to one 0.5 mg tablet twice daily for 4 days, then increase to one 1 mg tablet twice daily.    No Known Allergies   BP 120/77   Pulse 73   Ht 5\' 3"  (1.6 m)   Wt 216 lb (98 kg)   LMP 04/11/2012   BMI 38.26 kg/m   Physical Exam  Constitutional: She is oriented to person, place, and time. She appears well-developed and well-nourished.  Neurological: She is alert and oriented to person, place, and time.  Psychiatric: She has a normal mood and affect. Judgment normal.  Vitals  reviewed.  Left shoulder weakness in abduction only active abduction is to 90 degrees, forward elevation passive range of motion is painful from 90 degrees through 150 degrees.  Shoulder remained stable, empty can sign shows weakness.  Drop arm test is mildly positive as well.  More pain with the drop arm test.  Apprehension test negative  No swelling is seen around the joint there is some tenderness around the acromial area as well as the deltoid muscle  Medical decision-making Encounter Diagnoses  Name Primary?  . Pain in joint of left shoulder   . Impingement syndrome of shoulder, left   . Incomplete rotator cuff tear or rupture of left shoulder, not specified as traumatic Yes      MRI left shoulder rotator cuff tear   Arther Abbott, MD 08/06/2017 8:50 AM

## 2017-08-09 ENCOUNTER — Encounter: Payer: Self-pay | Admitting: Family Medicine

## 2017-08-09 MED ORDER — VARENICLINE TARTRATE 1 MG PO TABS: 1.0000 mg | ORAL_TABLET | Freq: Two times a day (BID) | ORAL | 0 refills | Status: DC

## 2017-08-09 NOTE — Telephone Encounter (Signed)
Please put in for patient to get the Chantix continuation pack, #60, 1 refill.

## 2017-08-10 ENCOUNTER — Telehealth: Payer: Self-pay | Admitting: Radiology

## 2017-08-10 NOTE — Telephone Encounter (Signed)
I left message for patient to call back, MRI is sch for Friday Feb 8th at 6pm, arrive at 545. I also need to sch her a follow up appointment when she calls back / to review scan.

## 2017-08-11 ENCOUNTER — Telehealth (HOSPITAL_COMMUNITY): Payer: Self-pay | Admitting: Occupational Therapy

## 2017-08-11 ENCOUNTER — Ambulatory Visit (HOSPITAL_COMMUNITY): Payer: BLUE CROSS/BLUE SHIELD | Admitting: Occupational Therapy

## 2017-08-11 NOTE — Telephone Encounter (Signed)
Called pt regarding no-show. Left message for pt requesting she call back and let us know if she wants to keep appts or hold until after MRI. Reminded of next appt on Mon 2/11 at [facility], OTR/L  [phone] 08/11/2017

## 2017-08-13 ENCOUNTER — Encounter (HOSPITAL_COMMUNITY): Payer: BLUE CROSS/BLUE SHIELD | Admitting: Occupational Therapy

## 2017-08-13 ENCOUNTER — Ambulatory Visit (HOSPITAL_COMMUNITY): Admission: RE | Admit: 2017-08-13 | Payer: BLUE CROSS/BLUE SHIELD | Source: Ambulatory Visit

## 2017-08-13 ENCOUNTER — Ambulatory Visit: Payer: BLUE CROSS/BLUE SHIELD | Admitting: Gynecology

## 2017-08-16 ENCOUNTER — Ambulatory Visit (HOSPITAL_COMMUNITY): Payer: BLUE CROSS/BLUE SHIELD

## 2017-08-17 ENCOUNTER — Ambulatory Visit: Payer: BLUE CROSS/BLUE SHIELD | Admitting: Gynecology

## 2017-08-18 ENCOUNTER — Ambulatory Visit (HOSPITAL_COMMUNITY): Payer: BLUE CROSS/BLUE SHIELD | Admitting: Occupational Therapy

## 2017-08-18 ENCOUNTER — Telehealth (HOSPITAL_COMMUNITY): Payer: Self-pay | Admitting: Occupational Therapy

## 2017-08-18 NOTE — Telephone Encounter (Signed)
Called pt re: no-show for 4pm appt. Left message informing pt of discharge due to 3 no-shows.    Guadelupe Sabin, OTR/L  [phone] 08/18/2017

## 2017-08-21 ENCOUNTER — Encounter: Payer: Self-pay | Admitting: Family Medicine

## 2017-08-21 ENCOUNTER — Telehealth: Payer: BLUE CROSS/BLUE SHIELD | Admitting: Nurse Practitioner

## 2017-08-21 DIAGNOSIS — J0100 Acute maxillary sinusitis, unspecified: Secondary | ICD-10-CM

## 2017-08-21 MED ORDER — AMOXICILLIN-POT CLAVULANATE 875-125 MG PO TABS: 1.0000 | ORAL_TABLET | Freq: Two times a day (BID) | ORAL | 0 refills | Status: DC

## 2017-08-21 NOTE — Progress Notes (Signed)

## 2017-08-21 NOTE — Addendum Note (Signed)
Addended by: Chevis Pretty on: 08/21/2017 11:45 AM   Modules accepted: Orders

## 2017-08-25 ENCOUNTER — Ambulatory Visit: Payer: BLUE CROSS/BLUE SHIELD | Admitting: Orthopedic Surgery

## 2017-08-25 ENCOUNTER — Encounter (HOSPITAL_COMMUNITY): Payer: BLUE CROSS/BLUE SHIELD | Admitting: Occupational Therapy

## 2017-08-25 ENCOUNTER — Encounter: Payer: Self-pay | Admitting: Family Medicine

## 2017-08-30 ENCOUNTER — Ambulatory Visit: Payer: BLUE CROSS/BLUE SHIELD | Admitting: Orthopedic Surgery

## 2017-09-09 ENCOUNTER — Encounter: Payer: Self-pay | Admitting: Family Medicine

## 2017-09-09 DIAGNOSIS — M79643 Pain in unspecified hand: Secondary | ICD-10-CM

## 2017-09-10 ENCOUNTER — Telehealth: Payer: BLUE CROSS/BLUE SHIELD | Admitting: Family

## 2017-09-10 DIAGNOSIS — M5440 Lumbago with sciatica, unspecified side: Secondary | ICD-10-CM

## 2017-09-10 MED ORDER — ETODOLAC 300 MG PO CAPS: 300.0000 mg | ORAL_CAPSULE | Freq: Two times a day (BID) | ORAL | 0 refills | Status: DC

## 2017-09-10 MED ORDER — BACLOFEN 10 MG PO TABS: 10.0000 mg | ORAL_TABLET | Freq: Three times a day (TID) | ORAL | 0 refills | Status: DC | PRN

## 2017-09-10 NOTE — Progress Notes (Signed)
Thank you for the details you included in the comment boxes. Those details are very helpful in determining the best course of treatment for you and help [place] to provide the best care. See plan below. If this does not begin to improve within 24 hours, you should be seen face-to-face.   We are sorry that you are not feeling well.  Here is how we plan to help!  Based on what you have shared with me it looks like you mostly have acute back pain.  Acute back pain is defined as musculoskeletal pain that can resolve in 1-3 weeks with conservative treatment.  I have prescribed Etodolac 300 mg twice a day non-steroid anti-inflammatory (NSAID) as well as Baclofen 10 mg every eight hours as needed which is a muscle relaxer  Some patients experience stomach irritation or in increased heartburn with anti-inflammatory drugs.  Please keep in mind that muscle relaxer's can cause fatigue and should not be taken while at work or driving.  Back pain is very common.  The pain often gets better over time.  The cause of back pain is usually not dangerous.  Most people can learn to manage their back pain on their own.  Home Care  Stay active.  Start with short walks on flat ground if you can.  Try to walk farther each day.  Do not sit, drive or stand in one place for more than 30 minutes.  Do not stay in bed.  Do not avoid exercise or work.  Activity can help your back heal faster.  Be careful when you bend or lift an object.  Bend at your knees, keep the object close to you, and do not twist.  Sleep on a firm mattress.  Lie on your side, and bend your knees.  If you lie on your back, put a pillow under your knees.  Only take medicines as told by your doctor.  Put ice on the injured area.  Put ice in a plastic bag  Place a towel between your skin and the bag  Leave the ice on for 15-20 minutes, 3-4 times a day for the first 2-3 days. 210 After that, you can switch between ice and heat packs.  Ask your doctor  about back exercises or massage.  Avoid feeling anxious or stressed.  Find good ways to deal with stress, such as exercise.  Get Help Right Way If:  Your pain does not go away with rest or medicine.  Your pain does not go away in 1 week.  You have new problems.  You do not feel well.  The pain spreads into your legs.  You cannot control when you poop (bowel movement) or pee (urinate)  You feel sick to your stomach (nauseous) or throw up (vomit)  You have belly (abdominal) pain.  You feel like you may pass out (faint).  If you develop a fever.  Make Sure you:  Understand these instructions.  Will watch your condition  Will get help right away if you are not doing well or get worse.  Your e-visit answers were reviewed by a board certified advanced clinical practitioner to complete your personal care plan.  Depending on the condition, your plan could have included both over the counter or prescription medications.  If there is a problem please reply  once you have received a response from your provider.  Your safety is important to [place].  If you have drug allergies check your prescription carefully.    You  can use MyChart to ask questions about today's visit, request a non-urgent call back, or ask for a work or school excuse for 24 hours related to this e-Visit. If it has been greater than 24 hours you will need to follow up with your provider, or enter a new e-Visit to address those concerns.  You will get an e-mail in the next two days asking about your experience.  I hope that your e-visit has been valuable and will speed your recovery. Thank you for using e-visits.

## 2017-09-15 NOTE — Telephone Encounter (Signed)
She would like a referral

## 2017-09-15 NOTE — Telephone Encounter (Signed)
Please call and see if she can come in sooner or if she would like for [place] ot do a referral for her to see orthopedics regarding this hand pain. She is being followed there for shoulder issues. Gwen Her. Mannie Stabile, MD

## 2017-09-17 NOTE — Telephone Encounter (Signed)
Please advise that this has been done. April Ayala. Mannie Stabile, MD

## 2017-09-17 NOTE — Telephone Encounter (Signed)
She cannot get an appointment until April. Is there anything we can do to help her?

## 2017-09-29 ENCOUNTER — Ambulatory Visit (INDEPENDENT_AMBULATORY_CARE_PROVIDER_SITE_OTHER): Payer: Self-pay | Admitting: Orthopaedic Surgery

## 2017-09-29 ENCOUNTER — Ambulatory Visit (INDEPENDENT_AMBULATORY_CARE_PROVIDER_SITE_OTHER): Payer: Self-pay

## 2017-09-29 ENCOUNTER — Encounter (INDEPENDENT_AMBULATORY_CARE_PROVIDER_SITE_OTHER): Payer: Self-pay | Admitting: Orthopedic Surgery

## 2017-09-29 ENCOUNTER — Ambulatory Visit (INDEPENDENT_AMBULATORY_CARE_PROVIDER_SITE_OTHER): Payer: BLUE CROSS/BLUE SHIELD | Admitting: Orthopedic Surgery

## 2017-09-29 VITALS — BP 123/69 | HR 51 | Resp 18 | Ht 63.0 in | Wt 200.0 lb

## 2017-09-29 DIAGNOSIS — R200 Anesthesia of skin: Secondary | ICD-10-CM

## 2017-09-29 DIAGNOSIS — M79641 Pain in right hand: Secondary | ICD-10-CM | POA: Diagnosis not present

## 2017-09-29 DIAGNOSIS — M79642 Pain in left hand: Secondary | ICD-10-CM

## 2017-09-29 NOTE — Addendum Note (Signed)
Addended by: Deeann Dowse R on: 09/29/2017 02:29 PM   Modules accepted: Orders

## 2017-09-29 NOTE — Progress Notes (Signed)
Office Visit Note   Patient: April Ayala           Date of Birth: 1975/08/06           MRN: [id number] Visit Date: 09/29/2017              Requested by: Caren Macadam, [address], [place] [address] PCP: Caren Macadam, MD   Assessment & Plan: Visit Diagnoses:  1. Numbness in both hands   2. Pain in left hand   3. Pain in right hand     Plan:  #1: EMGs both hands to rule out carpal tunnel syndrome #2: Back up after tests are performed #3: Wrist splint was given to  Follow-Up Instructions: Return in about 2 weeks (around 10/13/2017).   Orders:  Orders Placed This Encounter  Procedures  . XR Hand Complete Right  . XR Hand Complete Left   No orders of the defined types were placed in this encounter.     Procedures: No procedures performed   Clinical Data: No additional findings.   Subjective: Chief Complaint  Patient presents with  . New Patient (Initial Visit)    bil lat hand pain for years, wears braces on wrist, now hurting worse and waking up with numbhands/arms    HPI  April Ayala is a 42 year old white female who presents today with bilateral hand pain and numbness.  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  Her right hand is much worse than her left when it comes to her numbness and tingling nighttime discomfort.  Does have some retrograde pain up into the forearm from the right wrist.  Numbness in her long finger to be the worst.  Have any dorsal numbness at this time.  Denies any cervical spine pain.  Denies any radicular type symptoms from the cervical spine. Denies triggering of the fingers.  Review of Systems  Constitutional: Negative for fatigue and fever.  HENT: Negative for ear pain.   Eyes: Negative for pain.  Respiratory: Negative for cough and shortness of breath.   Cardiovascular: Negative for leg swelling.  Gastrointestinal: Negative for blood in stool, constipation and diarrhea.    Genitourinary: Negative for difficulty urinating.  Musculoskeletal: Negative for back pain and neck pain.  Skin: Negative for rash and wound.  Allergic/Immunologic: Negative for food allergies.  Neurological: Positive for weakness and numbness. Negative for dizziness, light-headedness and headaches.  Hematological: Does not bruise/bleed easily.  Psychiatric/Behavioral: Positive for sleep disturbance.     Objective: Vital Signs: BP 123/69 (BP Location: Left Arm, Patient Position: Sitting, Cuff Size: Normal)   Pulse (!) 51   Resp 18   Ht 5\' 3"  (1.6 m)   Wt 200 lb (90.7 kg)   LMP 04/11/2012   BMI 35.43 kg/m   Physical Exam  Constitutional: She is oriented to person, place, and time. She appears well-developed and well-nourished.  HENT:  Head: Normocephalic and atraumatic.  Eyes: Pupils are equal, round, and reactive to light. EOM are normal.  Pulmonary/Chest: Effort normal.  Neurological: She is alert and oriented to person, place, and time.  Skin: Skin is warm and dry.  Psychiatric: She has a normal mood and affect. Her behavior is normal. Judgment and thought content normal.    Ortho Exam  She has a positive Phalen's test bilaterally.  Bilateral Tinel sign at the median nerve.  Some weakness with thumb to little finger on the right comparison to  the left.  Able to make a fist.  Left hand is stronger making her fist in the right.  Diffuse tenderness about the right wrist more in the scaphoid area.  Some pain with grinding of the first [place] joint.  Specialty Comments:  No specialty comments available.  Imaging: Xr Hand Complete Left  Result Date: 09/29/2017 X-rays of both hands reveals mild subluxation first CMC joint.  No other acute pathology noted.  Xr Hand Complete Right  Result Date: 09/29/2017 X-rays of both hands reveals mild subluxation first CMC joint.  No other acute pathology noted.    PMFS History: Patient Active Problem List   Diagnosis Date Noted  .  Urinary frequency 05/11/2014  . Cervical cancer ([place]) 03/30/2012   Past Medical History:  Diagnosis Date  . Asthma    recent bronchitis-rescue inhaler  . 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 History of kidney stones     Family History  Problem Relation Age of Onset  . Diabetes Mother   . Hypertension Father   . Diabetes Sister   . Hypothyroidism Sister     Past Surgical History:  Procedure Laterality Date  . ABDOMINAL HYSTERECTOMY    . BREAST BIOPSY    . CERVICAL CONIZATION W/BX  03/24/2012   Procedure: CONIZATION CERVIX WITH BIOPSY;  Surgeon: Woodroe Mode, MD;  Location: [facility];  Service: Gynecology;  Laterality: N/A;  . DIAGNOSTIC LAPAROSCOPY  2007   ectopic  . ECTOPIC PREGNANCY SURGERY    . ESSURE TUBAL LIGATION  2010  . INSERTION OF SUPRAPUBIC CATHETER  05/03/2012   Procedure: INSERTION OF SUPRAPUBIC CATHETER;  Surgeon: Alvino Chapel, MD;  Location: [facility];  Service: Gynecology;;  . LEEP    . LYMPHADENECTOMY  05/03/2012   Procedure: LYMPHADENECTOMY;  Surgeon: Alvino Chapel, MD;  Location: [facility];  Service: Gynecology;  Laterality: Bilateral;  pelvic   . RADICAL HYSTERECTOMY  05/03/2012   Procedure: RADICAL HYSTERECTOMY;  Surgeon: Alvino Chapel, MD;  Location: [facility];  Service: Gynecology;;  transposition of the ovaries  . TUBAL LIGATION     Social History   Occupational History    Employer: [employer]    Comment: Microbiologist  Tobacco Use  . Smoking status: Former Smoker    Packs/day: 1.00    Years: 19.00    Pack years: 19.00    Types: Cigarettes    Last attempt to quit: 05/19/2017    Years since quitting: 0.3  . Smokeless tobacco: Never Used  . Tobacco comment: smoking cessation information given  Substance and Sexual Activity  . Alcohol use: Yes    Comment: occasionally  . Drug use: No  . Sexual activity: Yes    Birth control/protection: Surgical

## 2017-10-06 ENCOUNTER — Ambulatory Visit: Payer: BLUE CROSS/BLUE SHIELD | Admitting: Family Medicine

## 2017-10-06 ENCOUNTER — Telehealth: Payer: Self-pay | Admitting: Family Medicine

## 2017-10-06 ENCOUNTER — Encounter (HOSPITAL_COMMUNITY): Payer: Self-pay | Admitting: Emergency Medicine

## 2017-10-06 ENCOUNTER — Emergency Department (HOSPITAL_COMMUNITY)
Admission: EM | Admit: 2017-10-06 | Discharge: 2017-10-06 | Disposition: A | Discharge status: Home / Self Care | Payer: BLUE CROSS/BLUE SHIELD | Attending: Emergency Medicine | Admitting: Emergency Medicine

## 2017-10-06 ENCOUNTER — Encounter: Payer: Self-pay | Admitting: Family Medicine

## 2017-10-06 ENCOUNTER — Other Ambulatory Visit: Payer: Self-pay

## 2017-10-06 DIAGNOSIS — R519 Headache, unspecified: Secondary | ICD-10-CM

## 2017-10-06 DIAGNOSIS — Z87891 Personal history of nicotine dependence: Secondary | ICD-10-CM | POA: Insufficient documentation

## 2017-10-06 DIAGNOSIS — J45909 Unspecified asthma, uncomplicated: Secondary | ICD-10-CM | POA: Diagnosis not present

## 2017-10-06 DIAGNOSIS — R110 Nausea: Secondary | ICD-10-CM | POA: Insufficient documentation

## 2017-10-06 DIAGNOSIS — R51 Headache: Secondary | ICD-10-CM | POA: Diagnosis present

## 2017-10-06 MED ORDER — DIAZEPAM 5 MG PO TABS
10.0000 mg | ORAL_TABLET | Freq: Once | ORAL | Status: AC
  Administered 2017-10-06: 10 mg via ORAL
  Filled 2017-10-06: qty 2

## 2017-10-06 MED ORDER — ONDANSETRON HCL 4 MG PO TABS
4.0000 mg | ORAL_TABLET | Freq: Once | ORAL | Status: AC
  Administered 2017-10-06: 4 mg via ORAL
  Filled 2017-10-06: qty 1

## 2017-10-06 MED ORDER — ONDANSETRON HCL 4 MG PO TABS: 4.0000 mg | ORAL_TABLET | Freq: Four times a day (QID) | ORAL | 0 refills | Status: DC

## 2017-10-06 MED ORDER — BUTALBITAL-APAP-CAFF-COD 50-325-40-30 MG PO CAPS: 1.0000 | ORAL_CAPSULE | ORAL | 0 refills | Status: DC | PRN

## 2017-10-06 MED ORDER — DIPHENHYDRAMINE HCL 25 MG PO CAPS
25.0000 mg | ORAL_CAPSULE | Freq: Once | ORAL | Status: AC
  Administered 2017-10-06: 25 mg via ORAL
  Filled 2017-10-06: qty 1

## 2017-10-06 MED ORDER — TRAMADOL HCL 50 MG PO TABS
100.0000 mg | ORAL_TABLET | Freq: Once | ORAL | Status: AC
  Administered 2017-10-06: 100 mg via ORAL
  Filled 2017-10-06: qty 2

## 2017-10-06 MED ORDER — VARENICLINE TARTRATE 1 MG PO TABS: 1.0000 mg | ORAL_TABLET | Freq: Two times a day (BID) | ORAL | 0 refills | Status: DC

## 2017-10-06 NOTE — Telephone Encounter (Signed)
Sent message via Mychart.

## 2017-10-06 NOTE — Telephone Encounter (Signed)
Please call patient and find out the quality of her headaches.  If this is the worst headache of her life that she is ever had, she should go to the emergency department for evaluation.  Headaches at this time of the year can be caused by seasonal allergies or sinus issues.  In addition, headaches can be caused due to stress or tension.  Please advise her to keep her scheduled follow-up with Dr. Meda Coffee tomorrow.  Advised her that if she is having worrisome symptoms such as numbness, tingling, worst headache of life that she should go to the emergency department for evaluation.  Otherwise, she can try over-the-counter Tylenol or Advil if she is not concurrently taking any other NSAIDs related to her shoulder or joint pains.

## 2017-10-06 NOTE — ED Triage Notes (Signed)
Pt c/o of migraine x3 days with light sensitivity and nausea.

## 2017-10-06 NOTE — ED Provider Notes (Signed)
[facility] EMERGENCY DEPARTMENT Provider Note   CSN: [id number] Arrival date & time: 10/06/17  1538     History   Chief Complaint Chief Complaint  Patient presents with  . Migraine    HPI April Ayala is a 42 y.o. female.  The history is provided by the patient.  Headache   This is a new problem. The current episode started more than 2 days ago. The problem occurs hourly. The problem has been gradually worsening. The headache is associated with nothing. The pain is located in the temporal (back of the neck, and around the eyes) region. The quality of the pain is described as throbbing. The pain is moderate. The pain radiates to the face, left neck and right neck. Associated symptoms include nausea. Pertinent negatives include no fever, no palpitations, no syncope and no shortness of breath. She has tried NSAIDs for the symptoms. The treatment provided no relief.    Past Medical History:  Diagnosis Date  . Asthma    recent bronchitis-rescue inhaler  . 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 History of kidney stones     Patient Active Problem List   Diagnosis Date Noted  . Urinary frequency 05/11/2014  . Cervical cancer ([place]) 03/30/2012    Past Surgical History:  Procedure Laterality Date  . ABDOMINAL HYSTERECTOMY    . BREAST BIOPSY    . CERVICAL CONIZATION W/BX  03/24/2012   Procedure: CONIZATION CERVIX WITH BIOPSY;  Surgeon: Woodroe Mode, MD;  Location: [facility];  Service: Gynecology;  Laterality: N/A;  . DIAGNOSTIC LAPAROSCOPY  2007   ectopic  . ECTOPIC PREGNANCY SURGERY    . ESSURE TUBAL LIGATION  2010  . INSERTION OF SUPRAPUBIC CATHETER  05/03/2012   Procedure: INSERTION OF SUPRAPUBIC CATHETER;  Surgeon: Alvino Chapel, MD;  Location: [facility];  Service: Gynecology;;  . LEEP    . LYMPHADENECTOMY  05/03/2012   Procedure: LYMPHADENECTOMY;  Surgeon: Alvino Chapel, MD;  Location: [facility];  Service: Gynecology;  Laterality:  Bilateral;  pelvic   . RADICAL HYSTERECTOMY  05/03/2012   Procedure: RADICAL HYSTERECTOMY;  Surgeon: Alvino Chapel, MD;  Location: [facility];  Service: Gynecology;;  transposition of the ovaries  . TUBAL LIGATION       OB History    Gravida  4   Para  4   Term  4   Preterm      AB      Living  4     SAB      TAB      Ectopic      Multiple      Live Births               Home Medications    Prior to Admission medications   Medication Sig Start Date End Date Taking? Authorizing Provider  amoxicillin-clavulanate (AUGMENTIN) 875-125 MG tablet Take 1 tablet by mouth 2 (two) times daily. Patient not taking: Reported on 09/29/2017 08/21/17   Chevis Pretty, FNP  baclofen (LIORESAL) 10 MG tablet Take 1 tablet (10 mg total) by mouth every 8 (eight) hours as needed for muscle spasms. 09/10/17   Withrow, Elyse Jarvis, FNP  etodolac (LODINE) 300 MG capsule Take 1 capsule (300 mg total) by mouth 2 (two) times daily. Patient not taking: Reported on 09/29/2017 09/10/17   Benjamine Mola, FNP  indomethacin (INDOCIN) 25 MG capsule Take 1 capsule (25 mg total) by mouth 2 (two) times daily  with a meal. 07/13/17   Carole Civil, MD  methocarbamol (ROBAXIN) 750 MG tablet Take 1 tablet (750 mg total) by mouth 2 (two) times daily as needed for muscle spasms. Patient not taking: Reported on 09/29/2017 07/20/17   Caren Macadam, MD  varenicline (CHANTIX CONTINUING MONTH PAK) 1 MG tablet Take 1 tablet (1 mg total) by mouth 2 (two) times daily. 10/06/17   Caren Macadam, MD    Family History Family History  Problem Relation Age of Onset  . Diabetes Mother   . Hypertension Father   . Diabetes Sister   . Hypothyroidism Sister     Social History Social History   Tobacco Use  . Smoking status: Former Smoker    Packs/day: 1.00    Years: 19.00    Pack years: 19.00    Types: Cigarettes    Last attempt to quit: 05/19/2017    Years since quitting: 0.3  . Smokeless tobacco: Never  Used  . Tobacco comment: smoking cessation information given  Substance Use Topics  . Alcohol use: Yes    Comment: occasionally  . Drug use: No     Allergies   Patient has no known allergies.   Review of Systems Review of Systems  Constitutional: Negative for activity change and fever.       All ROS Neg except as noted in HPI  HENT: Negative for nosebleeds.   Eyes: Negative for photophobia and discharge.  Respiratory: Negative for cough, shortness of breath and wheezing.   Cardiovascular: Negative for chest pain, palpitations and syncope.  Gastrointestinal: Positive for nausea. Negative for abdominal pain and blood in stool.  Genitourinary: Negative for dysuria, frequency and hematuria.  Musculoskeletal: Negative for arthralgias, back pain and neck pain.  Skin: Negative.   Neurological: Positive for light-headedness and headaches. Negative for dizziness, seizures and speech difficulty.  Psychiatric/Behavioral: Negative for confusion and hallucinations.     Physical Exam Updated Vital Signs BP (!) 118/59 (BP Location: Right Arm)   Pulse 96   Temp 98.8 F (37.1 C) (Oral)   Resp 18   Ht 5\' 3"  (1.6 m)   Wt 90.7 kg (200 lb)   LMP 04/11/2012   SpO2 98%   BMI 35.43 kg/m   Physical Exam  Constitutional: She is oriented to person, place, and time. She appears well-developed and well-nourished.  Non-toxic appearance. No distress.  HENT:  Head: Normocephalic and atraumatic.  Right Ear: Tympanic membrane and external ear normal.  Left Ear: Tympanic membrane and external ear normal.  Eyes: Pupils are equal, round, and reactive to light. Conjunctivae, EOM and lids are normal. Right eye exhibits no discharge. Left eye exhibits no discharge. No scleral icterus.  Neck: Normal range of motion. Neck supple. Carotid bruit is not present. No tracheal deviation present.  Cardiovascular: Normal rate, regular rhythm, normal heart sounds, intact distal pulses and normal pulses.    Pulmonary/Chest: Effort normal and breath sounds normal. No stridor. No respiratory distress. She has no wheezes. She has no rales.  Abdominal: Soft. Bowel sounds are normal. She exhibits no distension. There is no tenderness. There is no rebound and no guarding.  Musculoskeletal: Normal range of motion. She exhibits no edema or tenderness.  Lymphadenopathy:       Head (right side): No submandibular adenopathy present.       Head (left side): No submandibular adenopathy present.    She has no cervical adenopathy.  Neurological: She is alert and oriented to person, place, and time. She has normal  strength. No cranial nerve deficit or sensory deficit. She exhibits normal muscle tone. She displays no seizure activity. Coordination normal.  Skin: Skin is warm and dry. No rash noted.  Psychiatric: She has a normal mood and affect. Her speech is normal.  Nursing note and vitals reviewed.    ED Treatments / Results  Labs (all labs ordered are listed, but only abnormal results are displayed) Labs Reviewed - No data to display  EKG None  Radiology No results found.  Procedures Procedures (including critical care time)  Medications Ordered in ED Medications  diazepam (VALIUM) tablet 10 mg (has no administration in time range)  traMADol (ULTRAM) tablet 100 mg (has no administration in time range)  diphenhydrAMINE (BENADRYL) capsule 25 mg (has no administration in time range)  ondansetron (ZOFRAN) tablet 4 mg (has no administration in time range)     Initial Impression / Assessment and Plan / ED Course  I have reviewed the triage vital signs and the nursing notes.  Pertinent labs & imaging results that were available during my care of the patient were reviewed by me and considered in my medical decision making (see chart for details).       Final Clinical Impressions(s) / ED Diagnoses MDM  Vital signs reviewed.  Pulse oximetry is 98% on room air.  No gross neurologic deficits  appreciated on examination at this time.  Patient is ambulatory without problem.  Patient treated in the emergency department with oral Valium, oral Ultram, and oral Benadryl.  Recheck.  Patient states she is beginning to feel some better.  Patient is discharged home.  She has an appointment with Dr. Renato Battles in the next few days.  She will use Fioricet codeine and Zofran.  The patient will return to the emergency department if any emergent changes in condition, problems, or concerns.   Final diagnoses:  Bad headache    ED Discharge Orders        Ordered    butalbital-acetaminophen-caffeine (FIORICET/CODEINE) 50-325-40-30 MG capsule  Every 4 hours PRN     10/06/17 1731    ondansetron (ZOFRAN) 4 MG tablet  Every 6 hours     10/06/17 1731       Lily Kocher, PA-C 10/06/17 [place], Ankit, MD 10/09/17 1544

## 2017-10-06 NOTE — Telephone Encounter (Signed)
She is seeing Dr Meda Coffee tomorrow.

## 2017-10-06 NOTE — Telephone Encounter (Signed)
Called patient regarding message below. No answer, unable to leave message.  

## 2017-10-06 NOTE — Telephone Encounter (Signed)
Pt is calling in to see what else she can do for the headaches.

## 2017-10-06 NOTE — ED Notes (Signed)
Complains of HA - out of tramadol due to loss of insurance and has just returned with insurance Wearing sunglasses but is non photophobic, playing on cell phone when RN walks in room

## 2017-10-06 NOTE — Discharge Instructions (Addendum)
Please use 600 mg of ibuprofen and 500 mg of Tylenol every 6 hours for mild pain.  Please use Fioricet for more severe pain.  This medication may cause drowsiness. This medication may cause drowsiness. Please do not drink, drive, or participate in activity that requires concentration while taking this medication.  Please keep your appointment with Dr. Mannie Stabile concerning your headaches.  Return to the emergency department if any emergent changes in your condition, problems, or concerns.

## 2017-10-07 ENCOUNTER — Other Ambulatory Visit: Payer: Self-pay

## 2017-10-07 ENCOUNTER — Ambulatory Visit (INDEPENDENT_AMBULATORY_CARE_PROVIDER_SITE_OTHER): Payer: BLUE CROSS/BLUE SHIELD | Admitting: Family Medicine

## 2017-10-07 ENCOUNTER — Encounter: Payer: Self-pay | Admitting: Family Medicine

## 2017-10-07 VITALS — BP 110/64 | HR 82 | Temp 98.4°F | Ht 63.0 in | Wt 228.0 lb

## 2017-10-07 DIAGNOSIS — M25512 Pain in left shoulder: Secondary | ICD-10-CM

## 2017-10-07 DIAGNOSIS — G8929 Other chronic pain: Secondary | ICD-10-CM

## 2017-10-07 DIAGNOSIS — G44229 Chronic tension-type headache, not intractable: Secondary | ICD-10-CM | POA: Diagnosis not present

## 2017-10-07 DIAGNOSIS — Z720 Tobacco use: Secondary | ICD-10-CM | POA: Insufficient documentation

## 2017-10-07 HISTORY — DX: Other chronic pain: G89.29

## 2017-10-07 HISTORY — DX: Pain in left shoulder: M25.512

## 2017-10-07 HISTORY — DX: Chronic tension-type headache, not intractable: G44.229

## 2017-10-07 MED ORDER — TRAMADOL HCL 50 MG PO TABS: 100.0000 mg | ORAL_TABLET | Freq: Four times a day (QID) | ORAL | 0 refills | Status: AC | PRN

## 2017-10-07 MED ORDER — METHOCARBAMOL 750 MG PO TABS: 750.0000 mg | ORAL_TABLET | Freq: Two times a day (BID) | ORAL | 0 refills | Status: DC | PRN

## 2017-10-07 MED ORDER — ONDANSETRON HCL 4 MG PO TABS: 4.0000 mg | ORAL_TABLET | Freq: Three times a day (TID) | ORAL | 0 refills | Status: DC | PRN

## 2017-10-07 MED ORDER — IBUPROFEN 800 MG PO TABS: 800.0000 mg | ORAL_TABLET | Freq: Three times a day (TID) | ORAL | 0 refills | Status: DC | PRN

## 2017-10-07 NOTE — Progress Notes (Signed)
Chief Complaint  Patient presents with  . Headache   Usual PCP is Caren Macadam MD I am seeing her today for an acute visit after emergency room visit yesterday Patient states she has a long history of "migraine" and tension headaches She currently has had a headache for several days went to the emergency room yesterday.  She was given medication for pain and nausea, and felt better.  She picked up a prescription for Zofran and for Fioricet, went home and went to bed.  Woke up in the middle the night with a lower severe headache so she took these medications.  She states that the Fioricet "made me worse".  She has had a headache all day today.  She describes it as severe.  She states it has been "10 out of 10".  She has photophobia.  She has "flashing lights".  She has noise sensitivity.  She has nausea.  In spite of these complaints, she did work all day.  She is here today requesting the medications that work for her headaches previously.  Her prior physician used to give her ibuprofen and a muscle relaxer for tension headaches.  This worked well for her.  She then took tramadol if the headache was "severe".  She states she took this infrequently.  She has not seen a neurologist for her headache condition.  Patient Active Problem List   Diagnosis Date Noted  . Chronic tension-type headache, not intractable 10/07/2017  . Chronic left shoulder pain 10/07/2017  . Cervical cancer ([place]) 03/30/2012    Outpatient Encounter Medications as of 10/07/2017  Medication Sig  . ondansetron (ZOFRAN) 4 MG tablet Take 1 tablet (4 mg total) by mouth every 6 (six) hours.  . varenicline (CHANTIX CONTINUING MONTH PAK) 1 MG tablet Take 1 tablet (1 mg total) by mouth 2 (two) times daily.  Marland Kitchen ibuprofen (ADVIL,MOTRIN) 800 MG tablet Take 1 tablet (800 mg total) by mouth every 8 (eight) hours as needed for moderate pain.  . methocarbamol (ROBAXIN) 750 MG tablet Take 1 tablet (750 mg total) by mouth 2 (two) times daily  as needed for muscle spasms.  . ondansetron (ZOFRAN) 4 MG tablet Take 1 tablet (4 mg total) by mouth every 8 (eight) hours as needed for nausea or vomiting.  . traMADol (ULTRAM) 50 MG tablet Take 2 tablets (100 mg total) by mouth every 6 (six) hours as needed for up to 5 days.   No facility-administered encounter medications on file as of 10/07/2017.     No Known Allergies  Review of Systems  Constitutional: Negative for activity change, appetite change and unexpected weight change.  HENT: Negative for congestion, postnasal drip, sinus pressure and sinus pain.        Sensitivity to noise  Eyes: Positive for photophobia and visual disturbance.  Respiratory: Negative for cough and shortness of breath.   Cardiovascular: Negative for chest pain and palpitations.  Gastrointestinal: Positive for nausea. Negative for vomiting.  Neurological: Positive for headaches. Negative for dizziness.  Psychiatric/Behavioral: Positive for sleep disturbance.    BP 110/64   Pulse 82   Temp 98.4 F (36.9 C) (Oral)   Ht 5\' 3"  (1.6 m)   Wt 228 lb 0.6 oz (103.4 kg)   LMP 04/11/2012   SpO2 98%   BMI 40.40 kg/m   Physical Exam  Constitutional: She is oriented to person, place, and time. She appears well-developed and well-nourished.  HENT:  Head: Normocephalic and atraumatic.  Right Ear: External ear normal.  Left Ear: External ear normal.  Mouth/Throat: Oropharynx is clear and moist.  Eyes: Pupils are equal, round, and reactive to light. Conjunctivae and EOM are normal.  Discs flat  Neck: Normal range of motion. Neck supple. No thyromegaly present.  No tenderness in the neck or trapezius muscle  Cardiovascular: Normal rate, regular rhythm and normal heart sounds.  Pulmonary/Chest: Effort normal and breath sounds normal. No respiratory distress.  Abdominal: Soft. Bowel sounds are normal.  Musculoskeletal: Normal range of motion. She exhibits no edema.  Lymphadenopathy:    She has no cervical  adenopathy.  Neurological: She is alert and oriented to person, place, and time. She has normal strength. She displays normal reflexes. Coordination normal.  Gait normal  Skin: Skin is warm and dry.  Psychiatric: She has a normal mood and affect. Her behavior is normal. Thought content normal.  Nursing note and vitals reviewed.   ASSESSMENT/PLAN:  1. Chronic tension-type headache, not intractable No physical exam findings or historical findings to concern me.  I am going to refill her prior medications of ibuprofen 800 mg and methocarbamol to take as needed for routine muscle tension headaches.  We discussed the tramadol is a narcotic and should be used only for headaches that are severe.  She cannot drive while taking this medication.  Can cause dizziness drowsiness and constipation.  I did check the [facility] narcotic database and she had one prescription for tramadol, 12 tablets, in December of last year.  No other narcotics or controlled substances prescribed for a 1 year period of time  2. Chronic left shoulder pain  - methocarbamol (ROBAXIN) 750 MG tablet; Take 1 tablet (750 mg total) by mouth 2 (two) times daily as needed for muscle spasms.  Dispense: 40 tablet; Refill:    Patient Instructions  We discussed a plan of treatment for your headaches.  For moderate headaches take ibuprofen and muscle relaxer and rest.  For severe headaches take tramadol as needed.  I have given you enough medicine that should last 30 days.  For nausea take Zofran.  See Dr. Mannie Stabile within the month   Raylene Everts, MD

## 2017-10-07 NOTE — Patient Instructions (Signed)
We discussed a plan of treatment for your headaches.  For moderate headaches take ibuprofen and muscle relaxer and rest.  For severe headaches take tramadol as needed.  I have given you enough medicine that should last 30 days.  For nausea take Zofran.  See Dr. Mannie Stabile within the month

## 2017-10-20 ENCOUNTER — Other Ambulatory Visit: Payer: Self-pay | Admitting: Family Medicine

## 2017-10-20 ENCOUNTER — Encounter: Payer: Self-pay | Admitting: Family Medicine

## 2017-10-20 DIAGNOSIS — M25512 Pain in left shoulder: Principal | ICD-10-CM

## 2017-10-20 DIAGNOSIS — G8929 Other chronic pain: Secondary | ICD-10-CM

## 2017-10-21 NOTE — Telephone Encounter (Signed)
NO, I do not give tramadol for headaches and this was not prescribed by me. Please advise patient that she will need to contact prescribing doctor. This is not a good medication for headache management as it has some negative side effects and habit forming potential.  Gwen Her. Mannie Stabile, MD

## 2017-10-25 NOTE — Telephone Encounter (Signed)
I have tried twice to reach patient, I have been unsuccessful both times. I replied to her message on here and will follow up

## 2017-10-26 NOTE — Telephone Encounter (Signed)
This is the mychart message that patient sent reporting that she was taking the tramadol for her hand and it was helping her headaches:   I have an appt on may1st for my hands not sure who to ask but I'm waking up in middle of the night with pain in my hand even with wareing braces on taking aleve....what can do you suggest also the meds work great for my headaches....and my hand unfortunately I try not to take tramadol because it's only 15 days supply I'm going to have to rescadule do to my scadule at work ...     Just advise patient that she is supposed to have an appointment on May 1 and we will address at that time. Gwen Her. Mannie Stabile, MD

## 2017-10-27 ENCOUNTER — Ambulatory Visit: Payer: BLUE CROSS/BLUE SHIELD | Admitting: Family Medicine

## 2017-11-03 ENCOUNTER — Encounter (INDEPENDENT_AMBULATORY_CARE_PROVIDER_SITE_OTHER): Payer: Self-pay | Admitting: Physical Medicine and Rehabilitation

## 2017-11-03 ENCOUNTER — Ambulatory Visit (INDEPENDENT_AMBULATORY_CARE_PROVIDER_SITE_OTHER): Payer: BLUE CROSS/BLUE SHIELD | Admitting: Physical Medicine and Rehabilitation

## 2017-11-03 DIAGNOSIS — R202 Paresthesia of skin: Secondary | ICD-10-CM

## 2017-11-03 NOTE — Progress Notes (Signed)
  Numeric Pain Rating Scale and Functional Assessment Average Pain 5   In the last MONTH (on 0-10 scale) has pain interfered with the following?  1. General activity like being  able to carry out your everyday physical activities such as walking, climbing stairs, carrying groceries, or moving a chair?  Rating(6)     

## 2017-11-04 NOTE — Procedures (Signed)
EMG & NCV Findings: Evaluation of the left median motor nerve showed prolonged distal onset latency (9.4 ms) and reduced amplitude (0.4 mV).  The right median motor nerve showed prolonged distal onset latency (7.1 ms) and decreased conduction velocity (Elbow-Wrist, 43 m/s).  The left median (across palm) sensory and the right median (across palm) sensory nerves showed prolonged distal peak latency (Wrist, L8.4, R9.8 ms), reduced amplitude (L1.5, R3.0 V), and prolonged distal peak latency (Palm, L4.7, R4.7 ms).  All remaining nerves (as indicated in the following tables) were within normal limits.  Left vs. Right side comparison data for the median motor nerve indicates abnormal L-R latency difference (2.3 ms), abnormal L-R amplitude difference (94.0 %), and abnormal L-R velocity difference (Elbow-Wrist, 86 m/s).  All remaining left vs. right side differences were within normal limits.    Needle evaluation of the left abductor pollicis brevis muscle showed increased insertional activity and moderately increased spontaneous activity.  All remaining muscles (as indicated in the following table) showed no evidence of electrical instability.    Impression: The above electrodiagnostic study is ABNORMAL and reveals evidence of:  1.  A moderate to severe RIGHT median nerve entrapment at the wrist (carpal tunnel syndrome) affecting sensory and motor components.   2.  A severe LEFT median nerve entrapment at the wrist (carpal tunnel syndrome) affecting sensory and motor components. The lesion is characterized by sensory and motor demyelination with evidence of axonal injury.   There is no significant electrodiagnostic evidence of any other focal nerve entrapment, brachial plexopathy or generalized peripheral neuropathy.   Recommendations: 1.  Follow-up with referring physician. 2.  Continue current management of symptoms. 3.  Suggest surgical evaluation.    Nerve Conduction Studies Anti Sensory Summary  Table   Stim Site NR Peak (ms) Norm Peak (ms) P-T Amp (V) Norm P-T Amp Site1 Site2 Delta-P (ms) Dist (cm) Vel (m/s) Norm Vel (m/s)  Left Median Acr Palm Anti Sensory (2nd Digit)  33.1C  Wrist    *8.4 <3.6 *1.5 >10 Wrist Palm 3.7 0.0    Palm    *4.7 <2.0 17.9         Right Median Acr Palm Anti Sensory (2nd Digit)  32.6C  Wrist    *9.8 <3.6 *3.0 >10 Wrist Palm 5.1 0.0    Palm    *4.7 <2.0 34.4         Left Radial Anti Sensory (Base 1st Digit)  32.9C  Wrist    1.8 <3.1 42.9  Wrist Base 1st Digit 1.8 0.0    Right Radial Anti Sensory (Base 1st Digit)  33.9C  Wrist    1.9 <3.1 36.7  Wrist Base 1st Digit 1.9 0.0    Left Ulnar Anti Sensory (5th Digit)  33.2C  Wrist    3.0 <3.7 20.0 >15.0 Wrist 5th Digit 3.0 14.0 47 >38  Right Ulnar Anti Sensory (5th Digit)  33.2C  Wrist    3.0 <3.7 23.7 >15.0 Wrist 5th Digit 3.0 14.0 47 >38   Motor Summary Table   Stim Site NR Onset (ms) Norm Onset (ms) O-P Amp (mV) Norm O-P Amp Site1 Site2 Delta-0 (ms) Dist (cm) Vel (m/s) Norm Vel (m/s)  Left Median Motor (Abd Poll Brev)  33.1C  Wrist    *9.4 <4.2 *0.4 >5 Elbow Wrist 1.4 18.0 129 >50  Elbow    10.8  1.1  Axilla Elbow 3.0 0.0    Axilla    13.8  0.0  Right Median Motor (Abd Poll Brev)  33.9C  Wrist    *7.1 <4.2 6.7 >5 Elbow Wrist 4.2 18.0 *43 >50  Elbow    11.3  6.8         Left Ulnar Motor (Abd Dig Min)  33.2C  Wrist    2.7 <4.2 12.9 >3 B Elbow Wrist 2.8 18.0 64 >53  B Elbow    5.5  12.0  A Elbow B Elbow 1.0 10.0 100 >53  A Elbow    6.5  11.9         Right Ulnar Motor (Abd Dig Min)  34.4C  Wrist    2.9 <4.2 11.1 >3 B Elbow Wrist 2.9 18.0 62 >53  B Elbow    5.8  10.9  A Elbow B Elbow 1.0 10.0 100 >53  A Elbow    6.8  10.8          EMG   Side Muscle Nerve Root Ins Act Fibs Psw Amp Dur Poly Recrt Int Fraser Din Comment  Right Abd Poll Brev Median C8-T1 Nml Nml Nml Nml Nml 0 Nml Nml   Right 1stDorInt Ulnar C8-T1 Nml Nml Nml Nml Nml 0 Nml Nml   Left Abd Poll Brev Median C8-T1 *Incr *2+ *2+ Nml  Nml 0 Nml Nml   Left 1stDorInt Ulnar C8-T1 Nml Nml Nml Nml Nml 0 Nml Nml     Nerve Conduction Studies Anti Sensory Left/Right Comparison   Stim Site L Lat (ms) R Lat (ms) L-R Lat (ms) L Amp (V) R Amp (V) L-R Amp (%) Site1 Site2 L Vel (m/s) R Vel (m/s) L-R Vel (m/s)  Median Acr Palm Anti Sensory (2nd Digit)  33.1C  Wrist *8.4 *9.8 1.4 *1.5 *3.0 50.0 Wrist Palm     Palm *4.7 *4.7 0.0 17.9 34.4 48.0       Radial Anti Sensory (Base 1st Digit)  32.9C  Wrist 1.8 1.9 0.1 42.9 36.7 14.5 Wrist Base 1st Digit     Ulnar Anti Sensory (5th Digit)  33.2C  Wrist 3.0 3.0 0.0 20.0 23.7 15.6 Wrist 5th Digit 47 47 0   Motor Left/Right Comparison   Stim Site L Lat (ms) R Lat (ms) L-R Lat (ms) L Amp (mV) R Amp (mV) L-R Amp (%) Site1 Site2 L Vel (m/s) R Vel (m/s) L-R Vel (m/s)  Median Motor (Abd Poll Brev)  33.1C  Wrist *9.4 *7.1 *2.3 *0.4 6.7 *94.0 Elbow Wrist 129 *43 *86  Elbow 10.8 11.3 0.5 1.1 6.8 83.8 Axilla Elbow     Axilla 13.8   0.0         Ulnar Motor (Abd Dig Min)  33.2C  Wrist 2.7 2.9 0.2 12.9 11.1 14.0 B Elbow Wrist 64 62 2  B Elbow 5.5 5.8 0.3 12.0 10.9 9.2 A Elbow B Elbow 100 100 0  A Elbow 6.5 6.8 0.3 11.9 10.8 9.2          Waveforms:

## 2017-11-04 NOTE — Progress Notes (Signed)
April Ayala - 42 y.o. female MRN [id number]  Date of birth: [birth year]  Office Visit Note: Visit Date: 11/03/2017 PCP: Caren Macadam, MD Referred by: Caren Macadam, MD  Subjective: Chief Complaint  Patient presents with  . Right Forearm - Pain  . Right Hand - Numbness, Pain  . Left Hand - Pain, Numbness   HPI: April Ayala is a 42 year old right-hand-dominant female who comes in today at the request of Biagio Borg, PA-C for electrodiagnostic evaluation of both upper limbs.  She reports chronic worsening pain in the right arm and pain with numbness and tingling in the right hand particularly the middle finger being worse than the other fingers.  She predominately gets symptoms in the radial portion of the hand.  She gets left hand numbness more than pain in the middle and the thumb.  She reports is been going on for about 6 months from a severe standpoint but maybe has been off and on before that.  She reports that cold and movement makes her symptoms worse.  She does report nocturnal complaints.  She does report more numbness with holding her hand upper using the phone.  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  She reports wearing braces but that actually makes her symptoms worse.  She is tried anti-inflammatories.  She has not had prior electrodiagnostic study.  She has had no significant trauma or specific injury.  She denies frank radicular symptoms.   ROS Otherwise per HPI.  Assessment & Plan: Visit Diagnoses:  1. Paresthesia of skin     Plan: No additional findings.  Impression: The above electrodiagnostic study is ABNORMAL and reveals evidence of:  1.  A moderate to severe RIGHT median nerve entrapment at the wrist (carpal tunnel syndrome) affecting sensory and motor components.   2.  A severe LEFT median nerve entrapment at the wrist (carpal tunnel syndrome) affecting sensory and motor components. The lesion is characterized by sensory and  motor demyelination with evidence of axonal injury.   There is no significant electrodiagnostic evidence of any other focal nerve entrapment, brachial plexopathy or generalized peripheral neuropathy.   Recommendations: 1.  Follow-up with referring physician. 2.  Continue current management of symptoms. 3.  Suggest surgical evaluation.   Meds & Orders: No orders of the defined types were placed in this encounter.   Orders Placed This Encounter  Procedures  . NCV with EMG (electromyography)    Follow-up: Return for Dr. Alvia Grove, PA-C.   Procedures: No procedures performed  EMG & NCV Findings: Evaluation of the left median motor nerve showed prolonged distal onset latency (9.4 ms) and reduced amplitude (0.4 mV).  The right median motor nerve showed prolonged distal onset latency (7.1 ms) and decreased conduction velocity (Elbow-Wrist, 43 m/s).  The left median (across palm) sensory and the right median (across palm) sensory nerves showed prolonged distal peak latency (Wrist, L8.4, R9.8 ms), reduced amplitude (L1.5, R3.0 V), and prolonged distal peak latency (Palm, L4.7, R4.7 ms).  All remaining nerves (as indicated in the following tables) were within normal limits.  Left vs. Right side comparison data for the median motor nerve indicates abnormal L-R latency difference (2.3 ms), abnormal L-R amplitude difference (94.0 %), and abnormal L-R velocity difference (Elbow-Wrist, 86 m/s).  All remaining left vs. right side differences were within normal limits.    Needle evaluation of the left abductor pollicis brevis muscle showed increased insertional activity and moderately increased spontaneous activity.  All remaining muscles (as indicated in the following table) showed no evidence of electrical instability.    Impression: The above electrodiagnostic study is ABNORMAL and reveals evidence of:  1.  A moderate to severe RIGHT median nerve entrapment at the wrist (carpal tunnel  syndrome) affecting sensory and motor components.   2.  A severe LEFT median nerve entrapment at the wrist (carpal tunnel syndrome) affecting sensory and motor components. The lesion is characterized by sensory and motor demyelination with evidence of axonal injury.   There is no significant electrodiagnostic evidence of any other focal nerve entrapment, brachial plexopathy or generalized peripheral neuropathy.   Recommendations: 1.  Follow-up with referring physician. 2.  Continue current management of symptoms. 3.  Suggest surgical evaluation.    Nerve Conduction Studies Anti Sensory Summary Table   Stim Site NR Peak (ms) Norm Peak (ms) P-T Amp (V) Norm P-T Amp Site1 Site2 Delta-P (ms) Dist (cm) Vel (m/s) Norm Vel (m/s)  Left Median Acr Palm Anti Sensory (2nd Digit)  33.1C  Wrist    *8.4 <3.6 *1.5 >10 Wrist Palm 3.7 0.0    Palm    *4.7 <2.0 17.9         Right Median Acr Palm Anti Sensory (2nd Digit)  32.6C  Wrist    *9.8 <3.6 *3.0 >10 Wrist Palm 5.1 0.0    Palm    *4.7 <2.0 34.4         Left Radial Anti Sensory (Base 1st Digit)  32.9C  Wrist    1.8 <3.1 42.9  Wrist Base 1st Digit 1.8 0.0    Right Radial Anti Sensory (Base 1st Digit)  33.9C  Wrist    1.9 <3.1 36.7  Wrist Base 1st Digit 1.9 0.0    Left Ulnar Anti Sensory (5th Digit)  33.2C  Wrist    3.0 <3.7 20.0 >15.0 Wrist 5th Digit 3.0 14.0 47 >38  Right Ulnar Anti Sensory (5th Digit)  33.2C  Wrist    3.0 <3.7 23.7 >15.0 Wrist 5th Digit 3.0 14.0 47 >38   Motor Summary Table   Stim Site NR Onset (ms) Norm Onset (ms) O-P Amp (mV) Norm O-P Amp Site1 Site2 Delta-0 (ms) Dist (cm) Vel (m/s) Norm Vel (m/s)  Left Median Motor (Abd Poll Brev)  33.1C  Wrist    *9.4 <4.2 *0.4 >5 Elbow Wrist 1.4 18.0 129 >50  Elbow    10.8  1.1  Axilla Elbow 3.0 0.0    Axilla    13.8  0.0         Right Median Motor (Abd Poll Brev)  33.9C  Wrist    *7.1 <4.2 6.7 >5 Elbow Wrist 4.2 18.0 *43 >50  Elbow    11.3  6.8         Left Ulnar Motor (Abd  Dig Min)  33.2C  Wrist    2.7 <4.2 12.9 >3 B Elbow Wrist 2.8 18.0 64 >53  B Elbow    5.5  12.0  A Elbow B Elbow 1.0 10.0 100 >53  A Elbow    6.5  11.9         Right Ulnar Motor (Abd Dig Min)  34.4C  Wrist    2.9 <4.2 11.1 >3 B Elbow Wrist 2.9 18.0 62 >53  B Elbow    5.8  10.9  A Elbow B Elbow 1.0 10.0 100 >53  A Elbow    6.8  10.8          EMG  Side Muscle Nerve Root Ins Act Fibs Psw Amp Dur Poly Recrt Int Fraser Din Comment  Right Abd Poll Brev Median C8-T1 Nml Nml Nml Nml Nml 0 Nml Nml   Right 1stDorInt Ulnar C8-T1 Nml Nml Nml Nml Nml 0 Nml Nml   Left Abd Poll Brev Median C8-T1 *Incr *2+ *2+ Nml Nml 0 Nml Nml   Left 1stDorInt Ulnar C8-T1 Nml Nml Nml Nml Nml 0 Nml Nml     Nerve Conduction Studies Anti Sensory Left/Right Comparison   Stim Site L Lat (ms) R Lat (ms) L-R Lat (ms) L Amp (V) R Amp (V) L-R Amp (%) Site1 Site2 L Vel (m/s) R Vel (m/s) L-R Vel (m/s)  Median Acr Palm Anti Sensory (2nd Digit)  33.1C  Wrist *8.4 *9.8 1.4 *1.5 *3.0 50.0 Wrist Palm     Palm *4.7 *4.7 0.0 17.9 34.4 48.0       Radial Anti Sensory (Base 1st Digit)  32.9C  Wrist 1.8 1.9 0.1 42.9 36.7 14.5 Wrist Base 1st Digit     Ulnar Anti Sensory (5th Digit)  33.2C  Wrist 3.0 3.0 0.0 20.0 23.7 15.6 Wrist 5th Digit 47 47 0   Motor Left/Right Comparison   Stim Site L Lat (ms) R Lat (ms) L-R Lat (ms) L Amp (mV) R Amp (mV) L-R Amp (%) Site1 Site2 L Vel (m/s) R Vel (m/s) L-R Vel (m/s)  Median Motor (Abd Poll Brev)  33.1C  Wrist *9.4 *7.1 *2.3 *0.4 6.7 *94.0 Elbow Wrist 129 *43 *86  Elbow 10.8 11.3 0.5 1.1 6.8 83.8 Axilla Elbow     Axilla 13.8   0.0         Ulnar Motor (Abd Dig Min)  33.2C  Wrist 2.7 2.9 0.2 12.9 11.1 14.0 B Elbow Wrist 64 62 2  B Elbow 5.5 5.8 0.3 12.0 10.9 9.2 A Elbow B Elbow 100 100 0  A Elbow 6.5 6.8 0.3 11.9 10.8 9.2          Waveforms:                     Clinical History: No specialty comments available.   She reports that she quit smoking about 5 months ago. Her smoking  use included cigarettes. She has a 19.00 pack-year smoking history. She has never used smokeless tobacco.  Recent Labs    07/20/17 1542  HGBA1C 5.1    Objective:  VS:  HT:    WT:   BMI:     BP:   HR: bpm  TEMP: ( )  RESP:  Physical Exam  Musculoskeletal:  Inspection reveals flattening of the left more than right APB no atrophy of the bilateral  FDI or hand intrinsics. There is no swelling, color changes, allodynia or dystrophic changes. There is 5 out of 5 strength in the bilateral wrist extension, finger abduction and long finger flexion.  There is decreased sensation to light touch in the left more than right median nerve distribution.   There is a negative Hoffmann's test bilaterally.    Ortho Exam Imaging: No results found.  Past Medical/Family/Surgical/Social History: Medications & Allergies reviewed per EMR, new medications updated. Patient Active Problem List   Diagnosis Date Noted  . Chronic tension-type headache, not intractable 10/07/2017  . Chronic left shoulder pain 10/07/2017  . Cervical cancer ([place]) 03/30/2012   Past Medical History:  Diagnosis Date  . Asthma    recent bronchitis-rescue inhaler  . 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 History of kidney stones    Family History  Problem Relation Age of Onset  . Diabetes Mother   . Hypertension Father   . Diabetes Sister   . Hypothyroidism Sister    Past Surgical History:  Procedure Laterality Date  . ABDOMINAL HYSTERECTOMY    . BREAST BIOPSY    . CERVICAL CONIZATION W/BX  03/24/2012   Procedure: CONIZATION CERVIX WITH BIOPSY;  Surgeon: Woodroe Mode, MD;  Location: [facility];  Service: Gynecology;  Laterality: N/A;  . DIAGNOSTIC LAPAROSCOPY  2007   ectopic  . ECTOPIC PREGNANCY SURGERY    . ESSURE TUBAL LIGATION  2010  . INSERTION OF SUPRAPUBIC CATHETER  05/03/2012   Procedure: INSERTION OF SUPRAPUBIC CATHETER;  Surgeon: Alvino Chapel, MD;  Location: [facility];  Service:  Gynecology;;  . LEEP    . LYMPHADENECTOMY  05/03/2012   Procedure: LYMPHADENECTOMY;  Surgeon: Alvino Chapel, MD;  Location: [facility];  Service: Gynecology;  Laterality: Bilateral;  pelvic   . RADICAL HYSTERECTOMY  05/03/2012   Procedure: RADICAL HYSTERECTOMY;  Surgeon: Alvino Chapel, MD;  Location: [facility];  Service: Gynecology;;  transposition of the ovaries  . TUBAL LIGATION     Social History   Occupational History    Employer: [employer]    Comment: Microbiologist  Tobacco Use  . Smoking status: Former Smoker    Packs/day: 1.00    Years: 19.00    Pack years: 19.00    Types: Cigarettes    Last attempt to quit: 05/19/2017    Years since quitting: 0.4  . Smokeless tobacco: Never Used  . Tobacco comment: smoking cessation information given  Substance and Sexual Activity  . Alcohol use: Yes    Comment: occasionally  . Drug use: No  . Sexual activity: Yes    Birth control/protection: Surgical

## 2017-11-08 ENCOUNTER — Encounter (INDEPENDENT_AMBULATORY_CARE_PROVIDER_SITE_OTHER): Payer: Self-pay | Admitting: Orthopaedic Surgery

## 2017-11-08 ENCOUNTER — Ambulatory Visit (INDEPENDENT_AMBULATORY_CARE_PROVIDER_SITE_OTHER): Payer: BLUE CROSS/BLUE SHIELD | Admitting: Orthopaedic Surgery

## 2017-11-08 VITALS — BP 125/85 | HR 90 | Resp 16 | Ht 63.0 in | Wt 200.0 lb

## 2017-11-08 DIAGNOSIS — G5601 Carpal tunnel syndrome, right upper limb: Secondary | ICD-10-CM

## 2017-11-08 NOTE — Progress Notes (Signed)
Office Visit Note   Patient: April Ayala           Date of Birth: [birth year]           MRN: [id number] Visit Date: 11/08/2017              Requested by: Caren Macadam, [address], [place] [id number] PCP: Caren Macadam, MD   Assessment & Plan: Visit Diagnoses:  1. Carpal tunnel syndrome, right upper limb     Plan: EMGs nerve conduction studies reveal moderate to severe right carpal tunnel syndrome and severe left carpal tunnel syndrome.  She is more symptomatic on the right with pain.  Long discussion regarding treatment options.  She is been using a splint and wants to proceed with surgery.  We will schedule this at her convenience.  I have discussed the surgery, the incision and potential time out of work.  Follow-Up Instructions: Return will schedule surgery for right wrist.   Orders:  No orders of the defined types were placed in this encounter.  No orders of the defined types were placed in this encounter.     Procedures: No procedures performed   Clinical Data: No additional findings.   Subjective: Chief Complaint  Patient presents with  . Follow-up    review ncs pt states still having pain in both hands but right is worse  EMGs and nerve conduction studies demonstrate moderate to severe right carpal tunnel syndrome and severe left carpal tunnel syndrome more symptomatic on the right with pain.  He does have some numbness and tingling on the left.  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  HENT: Negative for ear pain.   Eyes: Negative for pain.  Respiratory: Negative for cough and shortness of breath.   Cardiovascular: Negative for leg swelling.  Gastrointestinal: Negative for constipation and diarrhea.  Genitourinary: Negative for difficulty urinating.  Musculoskeletal: Negative for back pain and neck pain.  Skin: Negative for rash.    Allergic/Immunologic: Negative for food allergies.  Neurological: Positive for weakness and numbness.  Hematological: Does not bruise/bleed easily.  Psychiatric/Behavioral: Positive for sleep disturbance.     Objective: Vital Signs: BP 125/85 (BP Location: Right Arm, Patient Position: Sitting, Cuff Size: Normal)   Pulse 90   Resp 16   Ht 5\' 3"  (1.6 m)   Wt 200 lb (90.7 kg)   LMP 04/11/2012   BMI 35.43 kg/m   Physical Exam  Constitutional: She is oriented to person, place, and time. She appears well-developed and well-nourished.  HENT:  Mouth/Throat: Oropharynx is clear and moist.  Eyes: Pupils are equal, round, and reactive to light. EOM are normal.  Pulmonary/Chest: Effort normal.  Neurological: She is alert and oriented to person, place, and time.  Skin: Skin is warm and dry.  Psychiatric: She has a normal mood and affect. Her behavior is normal.    Ortho Exam awake alert and oriented x3.  Comfortable sitting.  Positive Tinel's and Phalen's over the median nerve both wrists.  No finger swelling.  Neurovascular exam intact.  Good opposition of thumb to little finger.   Specialty Comments:  No specialty comments available.  Imaging: No results found.   PMFS History: Patient Active Problem List   Diagnosis Date Noted  . Chronic tension-type headache, not intractable 10/07/2017  . Chronic left shoulder pain 10/07/2017  . Cervical cancer ([facility]) 03/30/2012  Past Medical History:  Diagnosis Date  . Asthma    recent bronchitis-rescue inhaler  . 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 History of kidney stones     Family History  Problem Relation Age of Onset  . Diabetes Mother   . Hypertension Father   . Diabetes Sister   . Hypothyroidism Sister     Past Surgical History:  Procedure Laterality Date  . ABDOMINAL HYSTERECTOMY    . BREAST BIOPSY    . CERVICAL CONIZATION W/BX  03/24/2012   Procedure: CONIZATION CERVIX WITH BIOPSY;  Surgeon: Woodroe Mode, MD;  Location: [facility];  Service: Gynecology;  Laterality: N/A;  . DIAGNOSTIC LAPAROSCOPY  2007   ectopic  . ECTOPIC PREGNANCY SURGERY    . ESSURE TUBAL LIGATION  2010  . INSERTION OF SUPRAPUBIC CATHETER  05/03/2012   Procedure: INSERTION OF SUPRAPUBIC CATHETER;  Surgeon: Alvino Chapel, MD;  Location: [facility];  Service: Gynecology;;  . LEEP    . LYMPHADENECTOMY  05/03/2012   Procedure: LYMPHADENECTOMY;  Surgeon: Alvino Chapel, MD;  Location: [facility];  Service: Gynecology;  Laterality: Bilateral;  pelvic   . RADICAL HYSTERECTOMY  05/03/2012   Procedure: RADICAL HYSTERECTOMY;  Surgeon: Alvino Chapel, MD;  Location: [facility];  Service: Gynecology;;  transposition of the ovaries  . TUBAL LIGATION     Social History   Occupational History    Employer: [employer]    Comment: Microbiologist  Tobacco Use  . Smoking status: Former Smoker    Packs/day: 1.00    Years: 19.00    Pack years: 19.00    Types: Cigarettes    Last attempt to quit: 05/19/2017    Years since quitting: 0.4  . Smokeless tobacco: Never Used  . Tobacco comment: smoking cessation information given  Substance and Sexual Activity  . Alcohol use: Yes    Comment: occasionally  . Drug use: No  . Sexual activity: Yes    Birth control/protection: Surgical

## 2017-11-19 ENCOUNTER — Telehealth (INDEPENDENT_AMBULATORY_CARE_PROVIDER_SITE_OTHER): Payer: Self-pay | Admitting: Orthopaedic Surgery

## 2017-11-19 NOTE — Telephone Encounter (Signed)
Patient called stating her right hand is still swollen and is having trouble gripping things.  Patient states she is planning on making an appointment in the [place] office next Wednesday, but is asking if there is anything she can take until her appointment.

## 2017-11-19 NOTE — Telephone Encounter (Signed)
Called pt to notify she can take 600 mg of ibuprofen up to 3 times a day and tylenol in between: up to 3000 mg every 24 hrs. Per Dr. Durward Fortes. Could also use a splint in the mean time.

## 2017-11-23 ENCOUNTER — Ambulatory Visit: Payer: BLUE CROSS/BLUE SHIELD | Admitting: Family Medicine

## 2017-11-24 ENCOUNTER — Ambulatory Visit (INDEPENDENT_AMBULATORY_CARE_PROVIDER_SITE_OTHER): Payer: BLUE CROSS/BLUE SHIELD | Admitting: Orthopaedic Surgery

## 2017-11-24 ENCOUNTER — Encounter (INDEPENDENT_AMBULATORY_CARE_PROVIDER_SITE_OTHER): Payer: Self-pay | Admitting: Orthopaedic Surgery

## 2017-11-24 ENCOUNTER — Encounter: Payer: Self-pay | Admitting: Family Medicine

## 2017-11-24 VITALS — BP 126/92 | HR 96 | Ht 63.0 in | Wt 225.0 lb

## 2017-11-24 DIAGNOSIS — M79641 Pain in right hand: Secondary | ICD-10-CM

## 2017-11-24 NOTE — Progress Notes (Signed)
Office Visit Note   Patient: April Ayala           Date of Birth: [birth year]           MRN: [id number] Visit Date: 11/24/2017              Requested by: Caren Macadam, [address], [place] [address] PCP: Caren Macadam, MD   Assessment & Plan: Visit Diagnoses:  1. Pain of right hand     Plan: History of carpal tunnel syndrome treated with a splint.  Had insidious onset of right hand pain for several days.  Seen in the emergency room and placed on prednisone.  Kept out of work for 5 days and now follows up.  Carpal tunnel syndrome has not been that symptomatic but still having a fair amount of discomfort across the metacarpal phalangeal joints of her right hand with mild swelling.  Will have her continue with the prednisone and finish its course.  Keep out of work until Tuesday the 28th.  Continue with a volar wrist splint.  Appears that she has some inflammatory change about the metacarpal phalangeal joints of unknown etiology.  Several diagnostic possibilities including overuse syndrome.  Could have an extensor tenosynovitis. hopefully the prednisone, the splint and out of work will make a difference  Follow-Up Instructions: Return if symptoms worsen or fail to improve.   Orders:  No orders of the defined types were placed in this encounter.  No orders of the defined types were placed in this encounter.     Procedures: No procedures performed   Clinical Data: No additional findings.   Subjective: Chief Complaint  Patient presents with  . Right Hand - Pain  . Follow-up    HAND PAIN AND SWELLING, USING IBPROUPIN, ICE AND HEAT   Sided established diagnosis of right carpal tunnel syndrome by EMGs and nerve conduction studies.  April Ayala has elected to pursue a nonoperative treatment with a volar wrist splint and seems to be improving.  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  This  past weekend she developed a mild swelling in the dorsum of her hand with pain across the metacarpal phalangeal joints.  She was seen in the emergency room.  X-rays were obtained which I reviewed on the PACS system.  There were no abnormalities about the MP PIP or DIP joints.  Start prednisone and kept out of work until today.  Not sure she is feeling much better HPI  Review of Systems  Constitutional: Negative for fever.  HENT: Negative for ear pain.   Eyes: Negative for pain.  Respiratory: Negative for cough.   Cardiovascular: Negative for leg swelling.  Gastrointestinal: Negative for diarrhea.  Genitourinary: Negative for difficulty urinating.  Musculoskeletal: Negative for neck pain.  Skin: Negative for rash.  Allergic/Immunologic: Negative for food allergies.  Neurological: Positive for weakness and numbness.  Hematological: Does not bruise/bleed easily.  Psychiatric/Behavioral: Positive for sleep disturbance.     Objective: Vital Signs: BP (!) 126/92 (BP Location: Left Arm, Patient Position: Sitting, Cuff Size: Normal)   Pulse 96   Ht 5\' 3"  (1.6 m)   Wt 225 lb (102.1 kg)   LMP 04/11/2012   BMI 39.86 kg/m   Physical Exam  Constitutional: She is oriented to person, place, and time. She appears well-developed and well-nourished.  HENT:  Mouth/Throat: Oropharynx is clear and moist.  Eyes: Pupils are equal,  round, and reactive to light. EOM are normal.  Pulmonary/Chest: Effort normal.  Neurological: She is alert and oriented to person, place, and time.  Skin: Skin is warm and dry.  Psychiatric: She has a normal mood and affect. Her behavior is normal.    Ortho Exam awake alert and oriented x3 comfortable sitting.  Very mild swelling of right hand compared to left.  Able to make a full fist but tight across the metacarpal phalangeal joints diffusely of the right hand.  They are somewhat tender.  Fingers with minimal swelling.  No numbness.  Good capillary refill.  No pain in  the dorsum of her hand other than the metacarpal phalangeal joints.  No wrist pain.  Specialty Comments:  No specialty comments available.  Imaging: No results found.   PMFS History: Patient Active Problem List   Diagnosis Date Noted  . Chronic tension-type headache, not intractable 10/07/2017  . Chronic left shoulder pain 10/07/2017  . Cervical cancer ([facility]) 03/30/2012   Past Medical History:  Diagnosis Date  . Asthma    recent bronchitis-rescue inhaler  . Cervical cancer ([place]) 04/2011   Stage 1B squamous cell  . History of kidney stones     Family History  Problem Relation Age of Onset  . Diabetes Mother   . Hypertension Father   . Diabetes Sister   . Hypothyroidism Sister     Past Surgical History:  Procedure Laterality Date  . ABDOMINAL HYSTERECTOMY    . BREAST BIOPSY    . CERVICAL CONIZATION W/BX  03/24/2012   Procedure: CONIZATION CERVIX WITH BIOPSY;  Surgeon: Woodroe Mode, MD;  Location: [facility];  Service: Gynecology;  Laterality: N/A;  . DIAGNOSTIC LAPAROSCOPY  2007   ectopic  . ECTOPIC PREGNANCY SURGERY    . ESSURE TUBAL LIGATION  2010  . INSERTION OF SUPRAPUBIC CATHETER  05/03/2012   Procedure: INSERTION OF SUPRAPUBIC CATHETER;  Surgeon: Alvino Chapel, MD;  Location: [facility];  Service: Gynecology;;  . LEEP    . LYMPHADENECTOMY  05/03/2012   Procedure: LYMPHADENECTOMY;  Surgeon: Alvino Chapel, MD;  Location: [facility];  Service: Gynecology;  Laterality: Bilateral;  pelvic   . RADICAL HYSTERECTOMY  05/03/2012   Procedure: RADICAL HYSTERECTOMY;  Surgeon: Alvino Chapel, MD;  Location: [facility];  Service: Gynecology;;  transposition of the ovaries  . TUBAL LIGATION     Social History   Occupational History    Employer: [employer]    Comment: Microbiologist  Tobacco Use  . Smoking status: Former Smoker    Packs/day: 1.00    Years: 19.00    Pack years: 19.00    Types: Cigarettes    Last attempt to quit: 05/19/2017    Years  since quitting: 0.5  . Smokeless tobacco: Never Used  . Tobacco comment: smoking cessation information given  Substance and Sexual Activity  . Alcohol use: Yes    Comment: occasionally  . Drug use: No  . Sexual activity: Yes    Birth control/protection: Surgical

## 2017-12-09 ENCOUNTER — Encounter: Payer: Self-pay | Admitting: Orthopedic Surgery

## 2017-12-10 ENCOUNTER — Encounter: Payer: Self-pay | Admitting: Family Medicine

## 2018-01-14 ENCOUNTER — Ambulatory Visit: Payer: BLUE CROSS/BLUE SHIELD | Admitting: Family Medicine

## 2018-01-16 ENCOUNTER — Telehealth: Payer: BLUE CROSS/BLUE SHIELD | Admitting: Family

## 2018-01-16 DIAGNOSIS — R112 Nausea with vomiting, unspecified: Secondary | ICD-10-CM

## 2018-01-16 MED ORDER — ONDANSETRON 4 MG PO TBDP: 4.0000 mg | ORAL_TABLET | Freq: Three times a day (TID) | ORAL | 0 refills | Status: DC | PRN

## 2018-01-16 NOTE — Progress Notes (Signed)
Thank you for the details you included in the comment boxes. Those details are very helpful in determining the best course of treatment for you and help [place] to provide the best care.  We are sorry that you are not feeling well. Here is how we plan to help!  Based on what you have shared with me it looks like you have a Virus that is irritating your GI tract.  Vomiting is the forceful emptying of a portion of the stomach's content through the mouth.  Although nausea and vomiting can make you feel miserable, it's important to remember that these are not diseases, but rather symptoms of an underlying illness.  When we treat short term symptoms, we always caution that any symptoms that persist should be fully evaluated in a medical office.  I have prescribed a medication that will help alleviate your symptoms and allow you to stay hydrated:  Zofran 4 mg 1 tablet every 8 hours as needed for nausea and vomiting  HOME CARE:  Drink clear liquids.  This is very important! Dehydration (the lack of fluid) can lead to a serious complication.  Start off with 1 tablespoon every 5 minutes for 8 hours.  You may begin eating bland foods after 8 hours without vomiting.  Start with saltine crackers, white bread, rice, mashed potatoes, applesauce.  After 48 hours on a bland diet, you may resume a normal diet.  Try to go to sleep.  Sleep often empties the stomach and relieves the need to vomit.  GET HELP RIGHT AWAY IF:   Your symptoms do not improve or worsen within 2 days after treatment.  You have a fever for over 3 days.  You cannot keep down fluids after trying the medication.  MAKE SURE YOU:   Understand these instructions.  Will watch your condition.  Will get help right away if you are not doing well or get worse.   Thank you for choosing an e-visit. Your e-visit answers were reviewed by a board certified advanced clinical practitioner to complete your personal care plan. Depending upon the  condition, your plan could have included both over the counter or prescription medications. Please review your pharmacy choice. Be sure that the pharmacy you have chosen is open so that you can pick up your prescription now.  If there is a problem you may message your provider in [place] to have the prescription routed to another pharmacy. Your safety is important to [place]. If you have drug allergies check your prescription carefully.  For the next 24 hours, you can use MyChart to ask questions about today's visit, request a non-urgent call back, or ask for a work or school excuse from your e-visit provider. You will get an e-mail in the next two days asking about your experience. I hope that your e-visit has been valuable and will speed your recovery.

## 2018-01-21 ENCOUNTER — Other Ambulatory Visit: Payer: Self-pay

## 2018-01-21 ENCOUNTER — Emergency Department (HOSPITAL_COMMUNITY)
Admission: EM | Admit: 2018-01-21 | Discharge: 2018-01-22 | Disposition: A | Discharge status: Home / Self Care | Payer: BLUE CROSS/BLUE SHIELD | Attending: Emergency Medicine | Admitting: Emergency Medicine

## 2018-01-21 ENCOUNTER — Emergency Department (HOSPITAL_COMMUNITY): Payer: BLUE CROSS/BLUE SHIELD

## 2018-01-21 ENCOUNTER — Encounter (HOSPITAL_COMMUNITY): Payer: Self-pay | Admitting: Emergency Medicine

## 2018-01-21 DIAGNOSIS — N8301 Follicular cyst of right ovary: Secondary | ICD-10-CM | POA: Diagnosis not present

## 2018-01-21 DIAGNOSIS — R1031 Right lower quadrant pain: Secondary | ICD-10-CM | POA: Insufficient documentation

## 2018-01-21 DIAGNOSIS — Z79899 Other long term (current) drug therapy: Secondary | ICD-10-CM | POA: Insufficient documentation

## 2018-01-21 DIAGNOSIS — J45909 Unspecified asthma, uncomplicated: Secondary | ICD-10-CM | POA: Insufficient documentation

## 2018-01-21 DIAGNOSIS — Z87891 Personal history of nicotine dependence: Secondary | ICD-10-CM | POA: Diagnosis not present

## 2018-01-21 DIAGNOSIS — N8300 Follicular cyst of ovary, unspecified side: Secondary | ICD-10-CM

## 2018-01-21 LAB — URINALYSIS, ROUTINE W REFLEX MICROSCOPIC
Bilirubin Urine: NEGATIVE
GLUCOSE, UA: NEGATIVE mg/dL
Hgb urine dipstick: NEGATIVE
Ketones, ur: NEGATIVE mg/dL
LEUKOCYTES UA: NEGATIVE
Nitrite: NEGATIVE
PROTEIN: NEGATIVE mg/dL
Specific Gravity, Urine: 1.008 (ref 1.005–1.030)
pH: 7 (ref 5.0–8.0)

## 2018-01-21 LAB — CBC WITH DIFFERENTIAL/PLATELET
BASOS ABS: 0 10*3/uL (ref 0.0–0.1)
Basophils Relative: 0 %
Eosinophils Absolute: 0.2 10*3/uL (ref 0.0–0.7)
Eosinophils Relative: 1 %
HEMATOCRIT: 40.7 % (ref 36.0–46.0)
HEMOGLOBIN: 13.9 g/dL (ref 12.0–15.0)
LYMPHS PCT: 31 %
Lymphs Abs: 3.4 10*3/uL (ref 0.7–4.0)
MCH: 31.7 pg (ref 26.0–34.0)
MCHC: 34.2 g/dL (ref 30.0–36.0)
MCV: 92.9 fL (ref 78.0–100.0)
MONO ABS: 1 10*3/uL (ref 0.1–1.0)
Monocytes Relative: 9 %
NEUTROS PCT: 59 %
Neutro Abs: 6.5 10*3/uL (ref 1.7–7.7)
Platelets: 299 10*3/uL (ref 150–400)
RBC: 4.38 MIL/uL (ref 3.87–5.11)
RDW: 14.1 % (ref 11.5–15.5)
WBC: 11 10*3/uL — AB (ref 4.0–10.5)

## 2018-01-21 LAB — COMPREHENSIVE METABOLIC PANEL
ALT: 41 U/L (ref 0–44)
AST: 25 U/L (ref 15–41)
Albumin: 3.6 g/dL (ref 3.5–5.0)
Alkaline Phosphatase: 61 U/L (ref 38–126)
Anion gap: 7 (ref 5–15)
BILIRUBIN TOTAL: 0.6 mg/dL (ref 0.3–1.2)
BUN: 9 mg/dL (ref 6–20)
CO2: 26 mmol/L (ref 22–32)
CREATININE: 0.82 mg/dL (ref 0.44–1.00)
Calcium: 8.3 mg/dL — ABNORMAL LOW (ref 8.9–10.3)
Chloride: 102 mmol/L (ref 98–111)
GFR calc Af Amer: >60 mL/min (ref >60)
GLUCOSE: 99 mg/dL (ref 70–99)
Potassium: 3.6 mmol/L (ref 3.5–5.1)
Sodium: 135 mmol/L (ref 135–145)
TOTAL PROTEIN: 6.4 g/dL — AB (ref 6.5–8.1)

## 2018-01-21 LAB — PREGNANCY, URINE: PREG TEST UR: NEGATIVE

## 2018-01-21 MED ORDER — KETOROLAC TROMETHAMINE 30 MG/ML IJ SOLN
15.0000 mg | Freq: Once | INTRAMUSCULAR | Status: AC
  Administered 2018-01-21: 15 mg via INTRAVENOUS
  Filled 2018-01-21: qty 1

## 2018-01-21 MED ORDER — IOPAMIDOL (ISOVUE-300) INJECTION 61%
100.0000 mL | Freq: Once | INTRAVENOUS | Status: AC | PRN
  Administered 2018-01-21: 100 mL via INTRAVENOUS

## 2018-01-21 NOTE — ED Triage Notes (Signed)
Pt c/o rlq/groin/flank and back pain since this am. Hard to urinate.

## 2018-01-21 NOTE — ED Provider Notes (Addendum)
[facility] EMERGENCY DEPARTMENT Provider Note   CSN: [id number] Arrival date & time: 01/21/18  1932     History   Chief Complaint Chief Complaint  Patient presents with  . Flank Pain    HPI April Ayala is a 42 y.o. female.  HPI 42 year old female comes in with chief complaint of flank pain.  Patient has history of kidney stone and also remote history of cervical cancer and ovarian cyst.  Patient is status post hysterectomy.  Patient reports that she started having her current right-sided abdominal pain early in the morning.  The pain has been constant and located in the right lower quadrant, and it radiates to the groin and also towards her back.  Patient is not sure if her pain is similar to her ovarian cyst pain or kidney stone pain that she has had in the past.  Review of system is positive for nausea without emesis.  Patient's bowel movements have been normal.  She does have some discomfort with urination, however she has not seen any blood in the urine.  Past Medical History:  Diagnosis Date  . Asthma    recent bronchitis-rescue inhaler  . 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 History of kidney stones     Patient Active Problem List   Diagnosis Date Noted  . Chronic tension-type headache, not intractable 10/07/2017  . Chronic left shoulder pain 10/07/2017  . Cervical cancer ([place]) 03/30/2012    Past Surgical History:  Procedure Laterality Date  . ABDOMINAL HYSTERECTOMY    . BREAST BIOPSY    . CERVICAL CONIZATION W/BX  03/24/2012   Procedure: CONIZATION CERVIX WITH BIOPSY;  Surgeon: Woodroe Mode, MD;  Location: [facility];  Service: Gynecology;  Laterality: N/A;  . DIAGNOSTIC LAPAROSCOPY  2007   ectopic  . ECTOPIC PREGNANCY SURGERY    . ESSURE TUBAL LIGATION  2010  . INSERTION OF SUPRAPUBIC CATHETER  05/03/2012   Procedure: INSERTION OF SUPRAPUBIC CATHETER;  Surgeon: Alvino Chapel, MD;  Location: [facility];  Service:  Gynecology;;  . LEEP    . LYMPHADENECTOMY  05/03/2012   Procedure: LYMPHADENECTOMY;  Surgeon: Alvino Chapel, MD;  Location: [facility];  Service: Gynecology;  Laterality: Bilateral;  pelvic   . RADICAL HYSTERECTOMY  05/03/2012   Procedure: RADICAL HYSTERECTOMY;  Surgeon: Alvino Chapel, MD;  Location: [facility];  Service: Gynecology;;  transposition of the ovaries  . TUBAL LIGATION       OB History    Gravida  4   Para  4   Term  4   Preterm      AB      Living  4     SAB      TAB      Ectopic      Multiple      Live Births               Home Medications    Prior to Admission medications   Medication Sig Start Date End Date Taking? Authorizing Provider  acetaminophen (TYLENOL 8 HOUR) 650 MG CR tablet Take 1 tablet (650 mg total) by mouth every 8 (eight) hours. 01/22/18   Varney Biles, MD  ibuprofen (ADVIL,MOTRIN) 400 MG tablet Take 400 mg by mouth every 8 (eight) hours as needed. for pain 11/20/17   [provider]  ibuprofen (ADVIL,MOTRIN) 800 MG tablet Take 1 tablet (800 mg total) by mouth every 8 (eight) hours as needed for  moderate pain. 10/07/17   Raylene Everts, MD  methocarbamol (ROBAXIN) 750 MG tablet Take 1 tablet (750 mg total) by mouth 2 (two) times daily as needed for muscle spasms. 10/07/17   Raylene Everts, MD  naproxen (NAPROSYN) 500 MG tablet Take 1 tablet (500 mg total) by mouth 2 (two) times daily. 01/22/18   Varney Biles, MD  ondansetron (ZOFRAN ODT) 4 MG disintegrating tablet Take 1 tablet (4 mg total) by mouth every 8 (eight) hours as needed for nausea or vomiting. 01/16/18   Withrow, Elyse Jarvis, FNP  ondansetron (ZOFRAN) 4 MG tablet Take 1 tablet (4 mg total) by mouth every 6 (six) hours. 10/06/17   Lily Kocher, PA-C  ondansetron (ZOFRAN) 4 MG tablet Take 1 tablet (4 mg total) by mouth every 8 (eight) hours as needed for nausea or vomiting. 10/07/17   Raylene Everts, MD  predniSONE (DELTASONE) 20 MG tablet Take 40 mg  by mouth daily. 11/20/17   [provider]  varenicline (CHANTIX CONTINUING MONTH PAK) 1 MG tablet Take 1 tablet (1 mg total) by mouth 2 (two) times daily. 10/06/17   Caren Macadam, MD    Family History Family History  Problem Relation Age of Onset  . Diabetes Mother   . Hypertension Father   . Diabetes Sister   . Hypothyroidism Sister     Social History Social History   Tobacco Use  . Smoking status: Former Smoker    Packs/day: 1.00    Years: 19.00    Pack years: 19.00    Types: Cigarettes    Last attempt to quit: 05/19/2017    Years since quitting: 0.6  . Smokeless tobacco: Never Used  . Tobacco comment: smoking cessation information given  Substance Use Topics  . Alcohol use: Yes    Comment: occasionally  . Drug use: No     Allergies   Patient has no known allergies.   Review of Systems Review of Systems  Constitutional: Positive for activity change and appetite change.  Respiratory: Negative for shortness of breath.   Cardiovascular: Negative for chest pain.  Gastrointestinal: Positive for abdominal pain.  Genitourinary: Positive for flank pain.  Allergic/Immunologic: Negative for immunocompromised state.  All other systems reviewed and are negative.    Physical Exam Updated Vital Signs BP 123/79   Pulse 85   Temp 98.6 F (37 C) (Oral)   Resp 20   LMP 04/11/2012   SpO2 99%   Physical Exam  Constitutional: She is oriented to person, place, and time. She appears well-developed and well-nourished.  HENT:  Head: Normocephalic and atraumatic.  Eyes: Pupils are equal, round, and reactive to light. EOM are normal.  Neck: Neck supple.  Cardiovascular: Normal rate, regular rhythm and normal heart sounds.  No murmur heard. Pulmonary/Chest: Effort normal. No respiratory distress.  Abdominal: Soft. She exhibits no distension. There is tenderness. There is guarding. There is no rebound.  Right lower quadrant tenderness without rebound or guarding.   Patient also has tenderness over the right upper quadrant and right flank region  Neurological: She is alert and oriented to person, place, and time.  Skin: Skin is warm and dry.  Nursing note and vitals reviewed.    ED Treatments / Results  Labs (all labs ordered are listed, but only abnormal results are displayed) Labs Reviewed  URINALYSIS, ROUTINE W REFLEX MICROSCOPIC - Abnormal; Notable for the following components:      Result Value   Color, Urine STRAW (*)    All  other components within normal limits  CBC WITH DIFFERENTIAL/PLATELET - Abnormal; Notable for the following components:   WBC 11.0 (*)    All other components within normal limits  COMPREHENSIVE METABOLIC PANEL - Abnormal; Notable for the following components:   Calcium 8.3 (*)    Total Protein 6.4 (*)    All other components within normal limits  PREGNANCY, URINE    EKG None  Radiology Ct Abdomen Pelvis W Contrast  Result Date: 01/21/2018 CLINICAL DATA:  42 year old female with right lower quadrant abdominal pain. Concern for kidney stone versus appendicitis. EXAM: CT ABDOMEN AND PELVIS WITH CONTRAST TECHNIQUE: Multidetector CT imaging of the abdomen and pelvis was performed using the standard protocol following bolus administration of intravenous contrast. CONTRAST:  166mL ISOVUE-300 IOPAMIDOL (ISOVUE-300) INJECTION 61% COMPARISON:  CT of the abdomen pelvis dated 09/07/2014 and lumbar spine CT dated 12/13/2017 FINDINGS: Lower chest: The visualized lung bases are clear. No intra-abdominal free air or free fluid. Hepatobiliary: The liver is unremarkable. No intrahepatic biliary ductal dilatation. The gallbladder is predominantly contracted and contains multiple small stones. No pericholecystic fluid. Pancreas: Unremarkable. No pancreatic ductal dilatation or surrounding inflammatory changes. Spleen: Normal in size without focal abnormality. Adrenals/Urinary Tract: The adrenal glands are unremarkable. There is no  hydronephrosis on either side. There is symmetric enhancement of the kidneys. The visualized ureters appear unremarkable. The urinary bladder is collapsed. Stomach/Bowel: There is no bowel obstruction or active inflammation. The appendix is normal. Vascular/Lymphatic: No significant vascular findings are present. No enlarged abdominal or pelvic lymph nodes. Reproductive: Hysterectomy. The left ovary is unremarkable. The right ovary is enlarged with multiple follicles with the largest follicle measuring up to 3.5 cm. There is abutment of the right ovary to the base of the cecum which may represent adhesions. 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 There is associated narrowing of the central canal at L5-S1 as well as narrowing of the neural foramina. No acute fracture. IMPRESSION: 1. No bowel obstruction or active inflammation.  Normal appendix. 2. No hydronephrosis. 3. Mildly enlarged right ovary with multiple follicles. The right ovary abuts the base of the cecum which may represent adhesions. 4. Cholelithiasis. 5. Grade 2 L5-S1 anterolisthesis with associated narrowing of the central canal and neural foramina at this level. Electronically Signed   By: Anner Crete M.D.   On: 01/21/2018 22:01    Procedures Procedures (including critical care time)  Medications Ordered in ED Medications  ketorolac (TORADOL) 30 MG/ML injection 15 mg (15 mg Intravenous Given 01/21/18 2119)  iopamidol (ISOVUE-300) 61 % injection 100 mL (100 mLs Intravenous Contrast Given 01/21/18 2143)     Initial Impression / Assessment and Plan / ED Course  I have reviewed the triage vital signs and the nursing notes.  Pertinent labs & imaging results that were available during my care of the patient were reviewed by me and considered in my medical decision making (see chart for details).     42 year old female comes in with chief complaint of right-sided pain.  Patient's pain  is constant, and is located over the right side with radiation to the groin and her back.  Clinical concerns are for appendicitis, ureteral colic, ovarian cyst.  PID considered in the differential diagnosis, however patient does not have vaginal discharge, bleeding or high risk sexual behavior.  Plans to get CT abdomen and pelvis with contrast.  Final Clinical Impressions(s) / ED Diagnoses   Final diagnoses:  Ovarian follicular cyst    ED  Discharge Orders        Ordered    acetaminophen (TYLENOL 8 HOUR) 650 MG CR tablet  Every 8 hours     01/22/18 0020    naproxen (NAPROSYN) 500 MG tablet  2 times daily     01/22/18 0020       Varney Biles, MD 01/21/18 2128    Varney Biles, MD 01/22/18 0998

## 2018-01-21 NOTE — ED Notes (Signed)
Pt laughing w children IV est  meds given   Pain report 10/10

## 2018-01-21 NOTE — ED Notes (Signed)
Pt reports continued pain 

## 2018-01-21 NOTE — ED Notes (Signed)
Flank pain since this am Urine spec in lab

## 2018-01-21 NOTE — ED Notes (Signed)
To Rad 

## 2018-01-22 MED ORDER — ACETAMINOPHEN ER 650 MG PO TBCR: 650.0000 mg | EXTENDED_RELEASE_TABLET | Freq: Three times a day (TID) | ORAL | 0 refills | Status: DC

## 2018-01-22 MED ORDER — NAPROXEN 500 MG PO TABS: 500.0000 mg | ORAL_TABLET | Freq: Two times a day (BID) | ORAL | 0 refills | Status: DC

## 2018-01-22 NOTE — Discharge Instructions (Signed)
We saw you in the ER for flank pain. CT scan of your abdomen and pelvis does not show appendicitis or kidney stone, however large right-sided ovarian follicles were noted, which we suspect are causing her discomfort.  It is prudent that you follow-up with a gynecologist to ensure there is no concerning process going on. Please contact the OB physicians at the number provided for a prompt follow-up appointment.  Return to the ER immediately if you start having severe pain, sweating, severe nausea and vomiting, fevers.

## 2018-01-24 ENCOUNTER — Telehealth: Payer: Self-pay | Admitting: *Deleted

## 2018-01-24 NOTE — Telephone Encounter (Signed)
Patient stopped by to request a follow appt with Dr. Fermin Schwab. Patient was in the ER over the weekend. Patient scheduled for 8/13 at 1:15pm

## 2018-01-31 ENCOUNTER — Encounter: Payer: BLUE CROSS/BLUE SHIELD | Admitting: Obstetrics and Gynecology

## 2018-02-15 ENCOUNTER — Inpatient Hospital Stay: Payer: BLUE CROSS/BLUE SHIELD | Attending: Gynecology | Admitting: Gynecology

## 2018-02-15 ENCOUNTER — Encounter: Payer: Self-pay | Admitting: Gynecology

## 2018-02-15 ENCOUNTER — Other Ambulatory Visit: Payer: Self-pay | Admitting: Gynecologic Oncology

## 2018-02-15 ENCOUNTER — Other Ambulatory Visit (HOSPITAL_COMMUNITY)
Admission: RE | Admit: 2018-02-15 | Discharge: 2018-02-15 | Disposition: A | Discharge status: Home / Self Care | Payer: BLUE CROSS/BLUE SHIELD | Source: Ambulatory Visit | Attending: Gynecology | Admitting: Gynecology

## 2018-02-15 ENCOUNTER — Other Ambulatory Visit: Payer: Self-pay | Admitting: Family Medicine

## 2018-02-15 VITALS — BP 108/68 | HR 92 | Temp 98.4°F | Resp 20 | Ht 63.0 in | Wt 233.5 lb

## 2018-02-15 DIAGNOSIS — Z90710 Acquired absence of both cervix and uterus: Secondary | ICD-10-CM | POA: Diagnosis not present

## 2018-02-15 DIAGNOSIS — Z8541 Personal history of malignant neoplasm of cervix uteri: Secondary | ICD-10-CM | POA: Diagnosis not present

## 2018-02-15 DIAGNOSIS — Z1272 Encounter for screening for malignant neoplasm of vagina: Secondary | ICD-10-CM | POA: Diagnosis not present

## 2018-02-15 DIAGNOSIS — F17210 Nicotine dependence, cigarettes, uncomplicated: Secondary | ICD-10-CM | POA: Diagnosis not present

## 2018-02-15 DIAGNOSIS — C539 Malignant neoplasm of cervix uteri, unspecified: Secondary | ICD-10-CM | POA: Insufficient documentation

## 2018-02-15 DIAGNOSIS — N83209 Unspecified ovarian cyst, unspecified side: Secondary | ICD-10-CM

## 2018-02-15 NOTE — Addendum Note (Signed)
Addended by: Joylene John D on: 02/15/2018 04:26 PM   Modules accepted: Orders

## 2018-02-15 NOTE — Patient Instructions (Signed)
We will reach out to you about further information about receiving the depo injection to suppress your ovaries.  If your symptoms persist call the office so an appointment can be made for evaluation.  If your symptoms resolve you can follow up in one year.  Please call our office closer to the date to schedule at [phone]

## 2018-02-15 NOTE — Progress Notes (Signed)
Consult Note: Gyn-Onc   April Ayala 42 y.o. female  Chief Complaint  Patient presents with  . 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  No evidence of disease.  Right ovarian cyst and right lower quadrant pain.  Plan: We will attempt to suppress her ovary.  The patient says that she is not a good pill taker and therefore I would recommend we give her Depo-Provera injection 150 mg to suppress her over for the next 3 months.  (We will contact the pharmacy to have the Depo-Provera ordered and the patient return for her injection) if her symptoms resolve she can return in 1 year.  If they are worsening she will contact us for reevaluation and potential surgery.  Interval History: The patient was last seen here in 2016.  Over the last 2 weeks she is developed right lower quadrant pain.  She has had one prior episode.  She was seen in the emergency room where a CT of the abdomen and pelvis was performed revealing a right ovary with multiple follicles the largest of which is 3.5 cm.  Patient reports that she still has pain but that is somewhat improving.  Otherwise she denies any other pelvic symptoms.  Her health is been good except for some back problems.  HPI:  Stage IB 1 squamous cell carcinoma of the cervix undergoing radical hysterectomy and pelvic lymphadenectomy October 2012. Final pathology showed negative lymph nodes negative margins and cervical stroma invasion of less than 50%. No postoperative therapy was advised  Review of Systems:10 point review of systems is negative except as noted in interval history.   Vitals: Blood pressure 108/68, pulse 92, temperature 98.4 F (36.9 C), temperature source Oral, resp. rate 20, height 5\' 3"  (1.6 m), weight 233 lb 8 oz (105.9 kg), last menstrual period 04/11/2012, SpO2 98 %.  Physical Exam: General : The patient is a healthy woman in no acute distress.  HEENT:  normocephalic, extraoccular movements normal; neck is supple without thyromegally  Lynphnodes: Supraclavicular and inguinal nodes not enlarged  Abdomen: Obese, soft, non-tender, no ascites, no organomegally, no masses, no hernias  Pelvic:  EGBUS: Normal female  Vagina: Normal, no lesions  Urethra and Bladder: Normal, non-tender  Cervix: Surgically absent  Uterus: Surgically absent  Bi-manual examination: Non-tender; no adenxal masses or nodularity there is no pain on bimanual exam. Rectal: normal sphincter tone, no masses, no blood  Lower extremities: No edema or varicosities. Normal range of motion      No Known Allergies  Past Medical History:  Diagnosis Date  . Asthma    recent bronchitis-rescue inhaler  . 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 History of kidney stones     Past Surgical History:  Procedure Laterality Date  . ABDOMINAL HYSTERECTOMY    . BREAST BIOPSY    . CERVICAL CONIZATION W/BX  03/24/2012   Procedure: CONIZATION CERVIX WITH BIOPSY;  Surgeon: Woodroe Mode, MD;  Location: [facility];  Service: Gynecology;  Laterality: N/A;  . DIAGNOSTIC LAPAROSCOPY  2007   ectopic  . ECTOPIC PREGNANCY SURGERY    . ESSURE TUBAL LIGATION  2010  . INSERTION OF SUPRAPUBIC CATHETER  05/03/2012   Procedure: INSERTION OF SUPRAPUBIC CATHETER;  Surgeon: Alvino Chapel, MD;  Location: [facility];  Service: Gynecology;;  . LEEP    . LYMPHADENECTOMY  05/03/2012   Procedure: LYMPHADENECTOMY;  Surgeon: Quillian Quince  Doylene Bode, MD;  Location: [facility];  Service: Gynecology;  Laterality: Bilateral;  pelvic   . RADICAL HYSTERECTOMY  05/03/2012   Procedure: RADICAL HYSTERECTOMY;  Surgeon: Alvino Chapel, MD;  Location: [facility];  Service: Gynecology;;  transposition of the ovaries  . TUBAL LIGATION      Current Outpatient Medications  Medication Sig Dispense Refill  . acetaminophen (TYLENOL 8 HOUR) 650 MG CR tablet Take 1 tablet (650 mg total) by mouth  every 8 (eight) hours. 30 tablet 0  . ibuprofen (ADVIL,MOTRIN) 400 MG tablet Take 400 mg by mouth every 8 (eight) hours as needed. for pain  0  . ibuprofen (ADVIL,MOTRIN) 800 MG tablet Take 1 tablet (800 mg total) by mouth every 8 (eight) hours as needed for moderate pain. 50 tablet 0  . naproxen (NAPROSYN) 500 MG tablet Take 1 tablet (500 mg total) by mouth 2 (two) times daily. 30 tablet 0  . ondansetron (ZOFRAN ODT) 4 MG disintegrating tablet Take 1 tablet (4 mg total) by mouth every 8 (eight) hours as needed for nausea or vomiting. 20 tablet 0  . ondansetron (ZOFRAN) 4 MG tablet Take 1 tablet (4 mg total) by mouth every 6 (six) hours. 10 tablet 0  . ondansetron (ZOFRAN) 4 MG tablet Take 1 tablet (4 mg total) by mouth every 8 (eight) hours as needed for nausea or vomiting. 20 tablet 0  . varenicline (CHANTIX CONTINUING MONTH PAK) 1 MG tablet Take 1 tablet (1 mg total) by mouth 2 (two) times daily. 60 tablet 0  . methocarbamol (ROBAXIN) 750 MG tablet Take 1 tablet (750 mg total) by mouth 2 (two) times daily as needed for muscle spasms. (Patient not taking: Reported on 02/15/2018) 40 tablet 0   No current facility-administered medications for this visit.     Social History   Socioeconomic History  . Marital status: Legally Separated    Spouse name: Not on file  . Number of children: Not on file  . Years of education: Not on file  . Highest education level: Not on file  Occupational History    Employer: [employer]    Comment: [place]  . Financial resource strain: Not on file  . Food insecurity:    Worry: Not on file    Inability: Not on file  . Transportation needs:    Medical: Not on file    Non-medical: Not on file  Tobacco Use  . Smoking status: Former Smoker    Packs/day: 1.00    Years: 19.00    Pack years: 19.00    Types: Cigarettes    Last attempt to quit: 05/19/2017    Years since quitting: 0.7  . Smokeless tobacco: Never Used  . Tobacco  comment: smoking cessation information given  Substance and Sexual Activity  . Alcohol use: Yes    Comment: occasionally  . Drug use: No  . Sexual activity: Yes    Birth control/protection: Surgical  Lifestyle  . Physical activity:    Days per week: Not on file    Minutes per session: Not on file  . Stress: Not on file  Relationships  . Social connections:    Talks on phone: Not on file    Gets together: Not on file    Attends religious service: Not on file    Active member of club or organization: Not on file    Attends meetings of clubs or organizations: Not on file    Relationship status: Not on  file  . Intimate partner violence:    Fear of current or ex partner: Not on file    Emotionally abused: Not on file    Physically abused: Not on file    Forced sexual activity: Not on file  Other Topics Concern  . Not on file  Social History Narrative   Works as a Aeronautical engineer at [employer].   Lives in [place], [place].   Engaged to be married.    Married in the past and has four kids.   Has two new puppies.     Family History  Problem Relation Age of Onset  . Diabetes Mother   . Hypertension Father   . Diabetes Sister   . Hypothyroidism Sister       Marti Sleigh, MD 02/15/2018, 1:54 PM

## 2018-02-16 MED ORDER — VARENICLINE TARTRATE 1 MG PO TABS: 1.0000 mg | ORAL_TABLET | Freq: Two times a day (BID) | ORAL | 0 refills | Status: DC

## 2018-02-16 NOTE — Telephone Encounter (Signed)
Please put in refill in for chantix continuation pack. #1, 1 refill.

## 2018-02-17 ENCOUNTER — Encounter: Payer: Self-pay | Admitting: Gynecology

## 2018-02-17 NOTE — Progress Notes (Signed)
No authorization required per patient insurance. Called [employer] [place] spoke with April Reference # for my call - [id number] Depo-Provera

## 2018-02-18 LAB — CYTOLOGY - PAP: Diagnosis: NEGATIVE

## 2018-02-21 ENCOUNTER — Telehealth: Payer: Self-pay

## 2018-02-21 NOTE — Telephone Encounter (Signed)
Outgoing call to patient regarding recent Pap results per Joylene John NP as "normal"- no answer, left VM with results as normal and our contact info.

## 2018-02-23 ENCOUNTER — Encounter: Payer: Self-pay | Admitting: Family Medicine

## 2018-02-23 ENCOUNTER — Telehealth: Payer: Self-pay

## 2018-02-23 ENCOUNTER — Other Ambulatory Visit: Payer: Self-pay

## 2018-02-23 ENCOUNTER — Ambulatory Visit (INDEPENDENT_AMBULATORY_CARE_PROVIDER_SITE_OTHER): Payer: BLUE CROSS/BLUE SHIELD | Admitting: Family Medicine

## 2018-02-23 VITALS — BP 110/80 | HR 100 | Temp 98.7°F | Resp 16 | Ht 63.0 in | Wt 236.0 lb

## 2018-02-23 DIAGNOSIS — N83201 Unspecified ovarian cyst, right side: Secondary | ICD-10-CM | POA: Diagnosis not present

## 2018-02-23 DIAGNOSIS — M79641 Pain in right hand: Secondary | ICD-10-CM | POA: Diagnosis not present

## 2018-02-23 DIAGNOSIS — Z716 Tobacco abuse counseling: Secondary | ICD-10-CM | POA: Diagnosis not present

## 2018-02-23 DIAGNOSIS — Z23 Encounter for immunization: Secondary | ICD-10-CM

## 2018-02-23 MED ORDER — MEDROXYPROGESTERONE ACETATE 150 MG/ML IM SUSP: 150.0000 mg | Freq: Once | INTRAMUSCULAR | Status: DC

## 2018-02-23 MED ORDER — BUPROPION HCL ER (XL) 150 MG PO TB24: ORAL_TABLET | ORAL | 0 refills | Status: DC

## 2018-02-23 NOTE — Telephone Encounter (Signed)
Pt scheduled for her Depo-Provera injection on 02-24-18 at 1015 am.  Order is under sign and held. Spoke with [facility]  Pharmacist Kennith Center as Depo needs to be obtained through the [facility].

## 2018-02-23 NOTE — Progress Notes (Signed)
Patient ID: April Ayala, female    DOB: 16-Feb-1976, 42 y.o.   MRN: [id number]  Chief Complaint  Patient presents with  . swelling in right hand    Allergies Patient has no known allergies.  Subjective:   April Ayala is a 42 y.o. female who presents to [facility] today.  HPI Here for follow up.  She is currently out of work due to chronic back issues and injury on her job.  Since she has been out of work she reports that she has gained a good bit of weight because she cannot be as physically active.  Has not been successful in quitting smoking despite the fact that she has been on the chantix for many months.  Would like to try something else for smoking that could also maybe help with her appetite.  She reports that she has gained weight.  Reports that she has never had any seizures.  Has never tried Wellbutrin.  Believes it could possibly help with her mood to.  Does have a prior history of ADD and has been on Adderall in the past.  She denies any chest pain, shortness of breath, palpitations, or swelling in her extremities.  Has recently been seen by gynecologic oncologist and her checkup for history of cervical cancer was performed.  She reports that she got a good report and there is no evidence of any cancer.  However, she does have an ovarian cyst and is supposed to be getting the Depo-Provera injection.  She would like to get this injection in our office.  However, upon checking we are currently out of this injection but it is on order.  The note from her gynecologist was reviewed today.  She does have a follow-up with him in November 2019.  She is supposed to have been on the Depo Provera when she follows up.   She also reports that she would like to be evaluated for pain in her right hand.  She reports that approximately 4 months ago she started having pain in her knuckles.  She reports she was told this is not related to her carpal tunnel syndrome.  She  has been told she needs surgery for carpal tunnel syndrome but at this time she cannot afford the $300 co-pay for the surgery.  She reports that she has had pain in the knuckles of her right hand for the past 4 months.  She reports the do occasionally get red and swollen.  She reports that the pain is aching in quality.  Reports that her knuckles are tender.  She is concerned that she could have an inflammatory/autoimmune arthritis and would like to be checked.  She did have x-rays of her hand approximately 4 months ago which did not reveal any acute abnormalities.  Her sensation and grip strength are unchanged.   Hand Pain   The incident occurred more than 1 week ago. There was no injury mechanism. The pain is present in the right hand. The quality of the pain is described as aching. The pain does not radiate. The pain is at a severity of 5/10. The pain is moderate. The pain has been fluctuating since the incident. Pertinent negatives include no chest pain, muscle weakness, numbness or tingling. Nothing aggravates the symptoms. She has tried acetaminophen and rest for the symptoms. The treatment provided mild relief.    Past Medical History:  Diagnosis Date  . Asthma    recent bronchitis-rescue inhaler  . 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 History of kidney stones     Past Surgical History:  Procedure Laterality Date  . ABDOMINAL HYSTERECTOMY    . BREAST BIOPSY    . CERVICAL CONIZATION W/BX  03/24/2012   Procedure: CONIZATION CERVIX WITH BIOPSY;  Surgeon: Woodroe Mode, MD;  Location: [facility];  Service: Gynecology;  Laterality: N/A;  . DIAGNOSTIC LAPAROSCOPY  2007   ectopic  . ECTOPIC PREGNANCY SURGERY    . ESSURE TUBAL LIGATION  2010  . INSERTION OF SUPRAPUBIC CATHETER  05/03/2012   Procedure: INSERTION OF SUPRAPUBIC CATHETER;  Surgeon: Alvino Chapel, MD;  Location: [facility];  Service: Gynecology;;  . LEEP    . LYMPHADENECTOMY  05/03/2012   Procedure:  LYMPHADENECTOMY;  Surgeon: Alvino Chapel, MD;  Location: [facility];  Service: Gynecology;  Laterality: Bilateral;  pelvic   . RADICAL HYSTERECTOMY  05/03/2012   Procedure: RADICAL HYSTERECTOMY;  Surgeon: Alvino Chapel, MD;  Location: [facility];  Service: Gynecology;;  transposition of the ovaries  . TUBAL LIGATION      Family History  Problem Relation Age of Onset  . Diabetes Mother   . Hypertension Father   . Diabetes Sister   . Hypothyroidism Sister      Social History   Socioeconomic History  . Marital status: Legally Separated    Spouse name: Not on file  . Number of children: Not on file  . Years of education: Not on file  . Highest education level: Not on file  Occupational History    Employer: [employer]    Comment: [place]  . Financial resource strain: Not on file  . Food insecurity:    Worry: Not on file    Inability: Not on file  . Transportation needs:    Medical: Not on file    Non-medical: Not on file  Tobacco Use  . Smoking status: Former Smoker    Packs/day: 1.00    Years: 19.00    Pack years: 19.00    Types: Cigarettes    Last attempt to quit: 05/19/2017    Years since quitting: 0.7  . Smokeless tobacco: Never Used  . Tobacco comment: smoking cessation information given  Substance and Sexual Activity  . Alcohol use: Yes    Comment: occasionally  . Drug use: No  . Sexual activity: Yes    Birth control/protection: Surgical  Lifestyle  . Physical activity:    Days per week: Not on file    Minutes per session: Not on file  . Stress: Not on file  Relationships  . Social connections:    Talks on phone: Not on file    Gets together: Not on file    Attends religious service: Not on file    Active member of club or organization: Not on file    Attends meetings of clubs or organizations: Not on file    Relationship status: Not on file  Other Topics Concern  . Not on file  Social History Narrative   Works  as a Aeronautical engineer at [employer].   Lives in [place], [place].   Engaged to be married.    Married in the past and has four kids.   Has two new puppies.    Current Outpatient Medications on File Prior to Visit  Medication Sig Dispense Refill  . acetaminophen (TYLENOL 8 HOUR) 650 MG CR tablet Take 1 tablet (650 mg total) by mouth  every 8 (eight) hours. 30 tablet 0  . naproxen (NAPROSYN) 500 MG tablet Take 1 tablet (500 mg total) by mouth 2 (two) times daily. 30 tablet 0   No current facility-administered medications on file prior to visit.     Review of Systems  Cardiovascular: Negative for chest pain.  Neurological: Negative for tingling and numbness.     Objective:   BP 110/80 (BP Location: Right Arm, Patient Position: Sitting, Cuff Size: Large)   Pulse 100   Temp 98.7 F (37.1 C) (Temporal)   Resp 16   Ht '5\' 3"'  (1.6 m)   Wt 236 lb (107 kg)   LMP 04/11/2012   SpO2 98% Comment: room air  BMI 41.81 kg/m   Physical Exam  Constitutional: She is oriented to person, place, and time. She appears well-developed and well-nourished. No distress.  HENT:  Head: Normocephalic and atraumatic.  Eyes: Pupils are equal, round, and reactive to light.  Neck: Normal range of motion. Neck supple. No JVD present. No thyromegaly present.  Cardiovascular: Normal rate, regular rhythm and normal heart sounds.  Pulmonary/Chest: Effort normal and breath sounds normal. No respiratory distress.  Musculoskeletal: Normal range of motion. She exhibits no edema or deformity.       Right hand: She exhibits tenderness and bony tenderness. She exhibits normal two-point discrimination, normal capillary refill, no deformity, no laceration and no swelling.  Patient with right MCP joint tender to palpation.  No obvious edema or erythema present.  Mild tenderness to palpation over the joints.  Grip strength strong bilaterally.  Strength 5 out of 5 in upper and lower extremities. Neuro vasculature intact in upper  extremities bilaterally.  Lymphadenopathy:    She has no cervical adenopathy.  Neurological: She is alert and oriented to person, place, and time. No cranial nerve deficit.  Skin: Skin is warm and dry. Capillary refill takes less than 2 seconds.  Psychiatric: She has a normal mood and affect. Her behavior is normal. Judgment and thought content normal.  Nursing note and vitals reviewed.  Depression screen [facility] 2/9 02/23/2018 07/20/2017 06/18/2017  Decreased Interest 2 0 0  Down, Depressed, Hopeless 0 0 0  PHQ - 2 Score 2 0 0  Altered sleeping 3 - -  Tired, decreased energy 2 - -  Change in appetite 3 - -  Feeling bad or failure about yourself  0 - -  Trouble concentrating 3 - -  Moving slowly or fidgety/restless 2 - -  Suicidal thoughts 0 - -  PHQ-9 Score 15 - -  Difficult doing work/chores Somewhat difficult - -     Assessment and Plan  1. Encounter for tobacco use cessation counseling The 5 A's Model for treating Tobacco Use and Dependence was used today. I have identified and documented tobacco use status for this patient. I have urged the patient to quit tobacco use. At this time, the patient is willing and ready to attempt to quit. We have discussed medication options to aid in smoking cessation including nicotine replacement therapy (NRT), zyban, and chantix. We have also discussed behavioral modifications, lifestyle changes, and patient support options to aid in tobacco cessation success. Patient was congratulated on their desire to make this positive change for their health. Patient instructed to keep their follow up to ensure success.   Patient counseled in detail regarding the risks of medication. Told to call or return to clinic if develop any worrisome signs or symptoms. Patient voiced understanding.   - buPROPion (WELLBUTRIN XL) 150  MG 24 hr tablet; Take one pill each morning for 7 days then increase to two pills each morning  Dispense: 49 tablet; Refill: 0 We did discuss  that this medication is used for tobacco cessation and depression.  She does have symptoms of mild mood disorder at this time with history of ADD.  I do believe that she could benefit from this medication.  She was told to call if she has any abrupt changes in her mood or develops any suicidal or homicidal ideation. 2. Need for immunization against influenza Vaccine given.  - Flu Vaccine QUAD 36+ mos IM  3. Cyst of right ovary We are currently out of this injection at this time.  However she will check back with our office and if it is easy for her to get this year rather than from the pharmacy taking to [facility], she is welcome to get this done in our office. - medroxyPROGESTERone (DEPO-PROVERA) injection 150 mg-patient may return to clinic for injection.  4. Hand pain, right Discussed arthritis with patient today.  X-rays were reviewed.  We discussed osteoarthritis versus autoimmune arthritis.  Will check labs today.  She has an established relationship with orthopedics and if blood work is negative she can follow-up with orthopedics.  She can also try Tylenol as needed.  She also has a follow-up appointment regarding carpal tunnel syndrome. - Rheumatoid Factor - ANA,IFA RA Diag Pnl w/rflx Tit/Patn - Sed Rate (ESR) - CBC with Differential/Platelet  Diet, exercise, and weight loss discussed with patient today.  Greater than 25 minutes of was spent on this office visit.  Greater than 50% of office visit spent counseling and coordinating care. Return in about 2 weeks (around 03/09/2018). Caren Macadam, MD 02/23/2018

## 2018-02-24 ENCOUNTER — Encounter: Payer: Self-pay | Admitting: Family Medicine

## 2018-02-24 ENCOUNTER — Inpatient Hospital Stay: Payer: BLUE CROSS/BLUE SHIELD

## 2018-02-24 DIAGNOSIS — C539 Malignant neoplasm of cervix uteri, unspecified: Secondary | ICD-10-CM | POA: Diagnosis not present

## 2018-02-24 DIAGNOSIS — Z716 Tobacco abuse counseling: Secondary | ICD-10-CM

## 2018-02-24 DIAGNOSIS — N83209 Unspecified ovarian cyst, unspecified side: Secondary | ICD-10-CM

## 2018-02-24 MED ORDER — MEDROXYPROGESTERONE ACETATE 150 MG/ML IM SUSP
150.0000 mg | Freq: Once | INTRAMUSCULAR | Status: AC
  Administered 2018-02-24: 150 mg via INTRAMUSCULAR
  Filled 2018-02-24: qty 1

## 2018-02-24 NOTE — Patient Instructions (Addendum)
Medroxyprogesterone injection [Malignancy] What is this medicine? MEDROXYPROGESTERONE (me DROX ee proe JES te rone) is a man-made hormone. It is used to treat the symptoms of endometrial and renal cancer. This medicine may be used for other purposes; ask your health care provider or pharmacist if you have questions. COMMON BRAND NAME(S): Depo-Provera What should I tell my health care provider before I take this medicine? They need to know if you have any of these conditions: -blood vessel disease -breast cancer -history of a blood clot in the lungs or legs -liver disease -mental depression -migraine -seizures -stroke -vaginal bleeding -an unusual or allergic reaction to medroxyprogesterone, other hormones, medicines, foods, dyes, or preservatives -pregnant or trying to get pregnant -breast-feeding How should I use this medicine? This medicine is for injection into a muscle. It is given by a health care professional in a hospital or clinic setting. Talk to your pediatrician regarding the use of this medicine in children. Special care may be needed. Overdosage: If you think you have taken too much of this medicine contact a poison control center or emergency room at once. NOTE: This medicine is only for you. Do not share this medicine with others. What if I miss a dose? It is important not to miss your dose. Call your doctor or health care professional if you are unable to keep an appointment. What may interact with this medicine? Do not take this medicine with any of the following medications: -bosentan This medicine may also interact with the following medications: -aminoglutethimide -antibiotics or medicines for infections, especially rifampin, rifabutin, rifapentine, and griseofulvin -aprepitant -barbiturate medicines such as phenobarbital or primidone -bexarotene -carbamazepine -medicines for seizures like ethotoin, felbamate, oxcarbazepine, phenytoin,  topiramate -modafinil -St. John's wort This list may not describe all possible interactions. Give your health care provider a list of all the medicines, herbs, non-prescription drugs, or dietary supplements you use. Also tell them if you smoke, drink alcohol, or use illegal drugs. Some items may interact with your medicine. What should I watch for while using this medicine? Your condition will be monitored carefully while you are receiving this medicine. Women should inform their doctor if they wish to become pregnant or think they might be pregnant. There is a potential for serious side effects to an unborn child. Talk to your health care professional or pharmacist for more information. What side effects may I notice from receiving this medicine? Side effects that you should report to your doctor or health care professional as soon as possible: -allergic reactions like skin rash, itching or hives, swelling of the face, lips, or tongue -breast tenderness or discharge -breathing problems -changes in vision -depression -feeling faint or lightheaded, falls -fever -pain in the abdomen, chest, groin, or leg -problems with balance, talking, walking -unusually weak or tired -yellowing of the eyes or skin Side effects that usually do not require medical attention (report to your doctor or health care professional if they continue or are bothersome): -fluid retention, swelling -headache -irregular periods, spotting, or absent periods -irritation at site where injected -nausea -trouble sleeping -weight gain or loss This list may not describe all possible side effects. Call your doctor for medical advice about side effects. You may report side effects to FDA at 1-800-FDA-1088. Where should I keep my medicine? This drug is given in a hospital or clinic and will not be stored at home. NOTE: This sheet is a summary. It may not cover all possible information. If you have questions about this  medicine, talk  to your doctor, pharmacist, or health care provider.  2018 Elsevier/Gold Standard (2008-06-20 14:30:47)

## 2018-02-25 LAB — CBC WITH DIFFERENTIAL/PLATELET
BASOS ABS: 75 {cells}/uL (ref 0–200)
Basophils Relative: 0.7 %
EOS ABS: 203 {cells}/uL (ref 15–500)
Eosinophils Relative: 1.9 %
HCT: 43.8 % (ref 35.0–45.0)
Hemoglobin: 14.9 g/dL (ref 11.7–15.5)
Lymphs Abs: 3435 cells/uL (ref 850–3900)
MCH: 31.2 pg (ref 27.0–33.0)
MCHC: 34 g/dL (ref 32.0–36.0)
MCV: 91.8 fL (ref 80.0–100.0)
MONOS PCT: 7.3 %
MPV: 10.6 fL (ref 7.5–12.5)
NEUTROS PCT: 58 %
Neutro Abs: 6206 cells/uL (ref 1500–7800)
PLATELETS: 314 10*3/uL (ref 140–400)
RBC: 4.77 10*6/uL (ref 3.80–5.10)
RDW: 13.3 % (ref 11.0–15.0)
TOTAL LYMPHOCYTE: 32.1 %
WBC: 10.7 10*3/uL (ref 3.8–10.8)
WBCMIX: 781 {cells}/uL (ref 200–950)

## 2018-02-25 LAB — ANA,IFA RA DIAG PNL W/RFLX TIT/PATN: Anti Nuclear Antibody(ANA): NEGATIVE

## 2018-02-25 LAB — SEDIMENTATION RATE: Sed Rate: 2 mm/h (ref 0–20)

## 2018-03-01 ENCOUNTER — Encounter: Payer: Self-pay | Admitting: Family Medicine

## 2018-03-01 MED ORDER — BUPROPION HCL ER (XL) 150 MG PO TB24: 150.0000 mg | ORAL_TABLET | Freq: Every day | ORAL | 0 refills | Status: DC

## 2018-03-15 ENCOUNTER — Encounter: Payer: Self-pay | Admitting: Family Medicine

## 2018-03-16 ENCOUNTER — Ambulatory Visit (INDEPENDENT_AMBULATORY_CARE_PROVIDER_SITE_OTHER): Payer: BLUE CROSS/BLUE SHIELD | Admitting: Family Medicine

## 2018-03-16 ENCOUNTER — Ambulatory Visit (HOSPITAL_COMMUNITY)
Admission: RE | Admit: 2018-03-16 | Discharge: 2018-03-16 | Disposition: A | Discharge status: Home / Self Care | Payer: PRIVATE HEALTH INSURANCE | Source: Ambulatory Visit | Attending: Family Medicine | Admitting: Family Medicine

## 2018-03-16 ENCOUNTER — Other Ambulatory Visit: Payer: Self-pay

## 2018-03-16 ENCOUNTER — Encounter: Payer: Self-pay | Admitting: Family Medicine

## 2018-03-16 VITALS — BP 114/78 | HR 74 | Temp 97.8°F | Resp 12 | Ht 63.0 in | Wt 233.0 lb

## 2018-03-16 DIAGNOSIS — Z01818 Encounter for other preprocedural examination: Secondary | ICD-10-CM

## 2018-03-16 DIAGNOSIS — Z716 Tobacco abuse counseling: Secondary | ICD-10-CM | POA: Diagnosis not present

## 2018-03-16 DIAGNOSIS — Z23 Encounter for immunization: Secondary | ICD-10-CM | POA: Diagnosis not present

## 2018-03-16 MED ORDER — BUPROPION HCL ER (XL) 300 MG PO TB24: 300.0000 mg | ORAL_TABLET | Freq: Every day | ORAL | 1 refills | Status: DC

## 2018-03-16 NOTE — Progress Notes (Signed)
Patient ID: April Ayala, female    DOB: 07/22/1975, 42 y.o.   MRN: [id number]  Chief Complaint  Patient presents with  . Surgical Clearance    Back surgery    Allergies Patient has no known allergies.  Subjective:   April Ayala is a 42 y.o. female who presents to [facility] today.  HPI April Ayala presents today for surgical clearance.  She is going to have a TLIF L4-5 and fusion L4-S1 performed by Dr. Rolena Infante at [facility].  She has had numerous surgeries in the past and they are reviewed today.  She has never had any surgical anesthesia complications.  She denies any chest pain, shortness of breath, palpitations, or lower extremity edema.  She quit smoking approximately 5 days ago.  She has previously smoked 1/2 pack/day for approximately 20 years.  She was able to quit smoking with the help of Wellbutrin XL 150 mg.  She has been on this medication for several weeks.  She believes that it is helping her to quit smoking.  However, she would be interested in increasing the dose.  She has had no side effects with the medication.  She has no history of seizures.  She denies any history of asthma.  She has never been intubated.  She has no prior history of pneumonia.  She is not on any medications for cholesterol, hypertension, or diabetes.  She does not have these conditions to her knowledge.  She has no family history of sudden cardiac death.  Her family history is reviewed and updated today.  She has no known drug allergies.  She denies alcohol or drug use.   Past Medical History:  Diagnosis Date  . Cervical cancer ([place]) 04/2011   Stage 1B squamous cell  . History of kidney stones     Past Surgical History:  Procedure Laterality Date  . ABDOMINAL HYSTERECTOMY    . BREAST BIOPSY    . CERVICAL CONIZATION W/BX  03/24/2012   Procedure: CONIZATION CERVIX WITH BIOPSY;  Surgeon: Woodroe Mode, MD;  Location: [facility];  Service: Gynecology;  Laterality: N/A;  .  DIAGNOSTIC LAPAROSCOPY  2007   ectopic  . ECTOPIC PREGNANCY SURGERY    . ESSURE TUBAL LIGATION  2010  . INSERTION OF SUPRAPUBIC CATHETER  05/03/2012   Procedure: INSERTION OF SUPRAPUBIC CATHETER;  Surgeon: Alvino Chapel, MD;  Location: [facility];  Service: Gynecology;;  . LEEP    . LYMPHADENECTOMY  05/03/2012   Procedure: LYMPHADENECTOMY;  Surgeon: Alvino Chapel, MD;  Location: [facility];  Service: Gynecology;  Laterality: Bilateral;  pelvic   . RADICAL HYSTERECTOMY  05/03/2012   Procedure: RADICAL HYSTERECTOMY;  Surgeon: Alvino Chapel, MD;  Location: [facility];  Service: Gynecology;;  transposition of the ovaries  . TUBAL LIGATION      Family History  Problem Relation Age of Onset  . Diabetes Mother   . Hypertension Father   . Diabetes Sister   . Hypothyroidism Sister      Social History   Socioeconomic History  . Marital status: Legally Separated    Spouse name: Not on file  . Number of children: Not on file  . Years of education: Not on file  . Highest education level: Not on file  Occupational History    Employer: [employer]    Comment: [place]  . Financial resource strain: Not on file  . Food insecurity:    Worry: Not on  file    Inability: Not on file  . Transportation needs:    Medical: Not on file    Non-medical: Not on file  Tobacco Use  . Smoking status: Former Smoker    Packs/day: 1.00    Years: 19.00    Pack years: 19.00    Types: Cigarettes    Last attempt to quit: 05/19/2017    Years since quitting: 0.8  . Smokeless tobacco: Never Used  . Tobacco comment: smoking cessation information given  Substance and Sexual Activity  . Alcohol use: Not Currently    Comment: occasionally  . Drug use: No  . Sexual activity: Yes    Birth control/protection: Surgical  Lifestyle  . Physical activity:    Days per week: Not on file    Minutes per session: Not on file  . Stress: Not on file  Relationships  .  Social connections:    Talks on phone: Not on file    Gets together: Not on file    Attends religious service: Not on file    Active member of club or organization: Not on file    Attends meetings of clubs or organizations: Not on file    Relationship status: Not on file  Other Topics Concern  . 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    Lives in [place], [place].   Engaged to be married.    Married in the past and has four kids.   Has two new puppies.    Current Outpatient Medications on File Prior to Visit  Medication Sig Dispense Refill  . acetaminophen (TYLENOL 8 HOUR) 650 MG CR tablet Take 1 tablet (650 mg total) by mouth every 8 (eight) hours. 30 tablet 0  . naproxen (NAPROSYN) 500 MG tablet Take 1 tablet (500 mg total) by mouth 2 (two) times daily. 30 tablet 0  . traMADol (ULTRAM) 50 MG tablet Take 50 mg by mouth every 6 (six) hours as needed for pain.     No current facility-administered medications on file prior to visit.     Review of Systems  Constitutional: Negative for activity change, appetite change, chills, diaphoresis, fever and unexpected weight change.  HENT: Negative for mouth sores, trouble swallowing and voice change.   Eyes: Negative for visual disturbance.  Respiratory: Negative for cough, chest tightness and shortness of breath.   Cardiovascular: Negative for chest pain, palpitations and leg swelling.  Gastrointestinal: Negative for abdominal pain, anal bleeding, blood in stool, constipation, nausea and vomiting.  Endocrine: Negative for polyphagia and polyuria.  Genitourinary: Negative for dysuria, frequency, hematuria and urgency.  Skin: Negative for rash and wound.  Neurological: Negative for dizziness, seizures, syncope and light-headedness.  Hematological: Negative for adenopathy.  Psychiatric/Behavioral: Negative for decreased concentration, dysphoric mood,  sleep disturbance and suicidal ideas.     Objective:   BP 114/78 (BP Location: Right Arm, Patient Position: Sitting, Cuff Size: Large)   Pulse 74   Temp 97.8 F (36.6 C) (Temporal)   Resp 12   Ht 5\' 3"  (1.6 m)   Wt 233 lb 0.6 oz (105.7 kg)   LMP 04/11/2012   SpO2 99% Comment: room air  BMI 41.28 kg/m   Physical Exam  Constitutional: She is oriented to person, place, and time. She appears well-developed and well-nourished. No distress.  HENT:  Head: Normocephalic and atraumatic.  Mouth/Throat: Oropharynx is clear and moist. No  oropharyngeal exudate.  Eyes: Pupils are equal, round, and reactive to light. Conjunctivae are normal. No scleral icterus.  Neck: Normal range of motion. Neck supple. No JVD present. No tracheal deviation present. No thyromegaly present.  Cardiovascular: Normal rate, regular rhythm, normal heart sounds and intact distal pulses.  No murmur heard. Pulmonary/Chest: Effort normal and breath sounds normal. No respiratory distress. She has no wheezes.  Abdominal: Soft. Bowel sounds are normal. She exhibits no distension. There is no tenderness.  Lymphadenopathy:    She has no cervical adenopathy.  Neurological: She is alert and oriented to person, place, and time. No cranial nerve deficit. Coordination normal.  Skin: Skin is warm and dry. No rash noted.  Psychiatric: She has a normal mood and affect. Her behavior is normal. Judgment and thought content normal.  Nursing note and vitals reviewed.  Depression screen [facility] 2/9 03/16/2018 02/23/2018 07/20/2017 06/18/2017  Decreased Interest 2 2 0 0  Down, Depressed, Hopeless 0 0 0 0  PHQ - 2 Score 2 2 0 0  Altered sleeping 3 3 - -  Tired, decreased energy 2 2 - -  Change in appetite 2 3 - -  Feeling bad or failure about yourself  0 0 - -  Trouble concentrating 3 3 - -  Moving slowly or fidgety/restless 2 2 - -  Suicidal thoughts 0 0 - -  PHQ-9 Score 14 15 - -  Difficult doing work/chores Somewhat difficult  Somewhat difficult - -   EKG reviewed and performed in office today.  EKG with normal sinus rhythm.  Ventricular rate of 72.  Assessment and Plan  1. Pre-operative clearance Patient lab work performed recently.  Lab work was reviewed and discussed with patient.  She is unsure of her actual surgery date.  We will hold off on labs at this time due to the fact that she has had a recent CBC, BMP which were within normal limits.  Will obtain chest x-ray at this time.  Upon completion of chest x-ray will fax in form to [facility]. - EKG 12-Lead - DG Chest 2 View; Future  2. Encounter for tobacco use cessation counseling Patient was congratulated on her tobacco cessation.  We did discuss that she needs to maintain this cessation in order to have proper healing from her surgery and also to prevent future cardiovascular and pulmonary complications.  Patient voiced understanding.  Will increase medication at this time.  She is going to establish with a new PCP within the next month and will follow-up for review of her medication. - buPROPion (WELLBUTRIN XL) 300 MG 24 hr tablet; Take 1 tablet (300 mg total) by mouth daily.  Dispense: 30 tablet; Refill: 1 - Pneumococcal conjugate vaccine 13-valent  No follow-ups on file. Caren Macadam, MD 03/16/2018

## 2018-03-17 ENCOUNTER — Encounter: Payer: Self-pay | Admitting: Family Medicine

## 2018-03-28 ENCOUNTER — Ambulatory Visit: Payer: BLUE CROSS/BLUE SHIELD | Admitting: Family Medicine

## 2018-04-12 NOTE — H&P (Addendum)
Patient ID: April Ayala MRN: [id number] DOB/AGE: 03/29/1976 42 y.o.  Admit date: (Not on file)  Admission Diagnoses:  Lumbar five through sacral one slip with spinal stenosis  HPI: The patient is here today for a pre-operative History and Physical. They are scheduled for Decompression and fusion L4-S1   on 04-20-18 with Dr. Rolena Infante at [facility].  Pt stopped soking a month ago.  She smoked 2-3 cigarettes a day.  Past Medical History: Past Medical History:  Diagnosis Date  . Cervical cancer ([place]) 04/2011   Stage 1B squamous cell  . History of kidney stones     Surgical History: Past Surgical History:  Procedure Laterality Date  . ABDOMINAL HYSTERECTOMY    . BREAST BIOPSY    . CERVICAL CONIZATION W/BX  03/24/2012   Procedure: CONIZATION CERVIX WITH BIOPSY;  Surgeon: Woodroe Mode, MD;  Location: [facility];  Service: Gynecology;  Laterality: N/A;  . DIAGNOSTIC LAPAROSCOPY  2007   ectopic  . ECTOPIC PREGNANCY SURGERY    . ESSURE TUBAL LIGATION  2010  . INSERTION OF SUPRAPUBIC CATHETER  05/03/2012   Procedure: INSERTION OF SUPRAPUBIC CATHETER;  Surgeon: Alvino Chapel, MD;  Location: [facility];  Service: Gynecology;;  . LEEP    . LYMPHADENECTOMY  05/03/2012   Procedure: LYMPHADENECTOMY;  Surgeon: Alvino Chapel, MD;  Location: [facility];  Service: Gynecology;  Laterality: Bilateral;  pelvic   . RADICAL HYSTERECTOMY  05/03/2012   Procedure: RADICAL HYSTERECTOMY;  Surgeon: Alvino Chapel, MD;  Location: [facility];  Service: Gynecology;;  transposition of the ovaries  . TUBAL LIGATION      Family History: Family History  Problem Relation Age of Onset  . Diabetes Mother   . Hypertension Father   . Diabetes Sister   . Hypothyroidism Sister     Social History: Social History   Socioeconomic History  . Marital status: Legally Separated    Spouse name: Not on file  . Number of children: Not on file  . Years of education: Not on file    . Highest education level: Not on file  Occupational History    Employer: [employer]    Comment: [place]  . Financial resource strain: Not on file  . Food insecurity:    Worry: Not on file    Inability: Not on file  . Transportation needs:    Medical: Not on file    Non-medical: Not on file  Tobacco Use  . Smoking status: Former Smoker    Packs/day: 1.00    Years: 19.00    Pack years: 19.00    Types: Cigarettes    Last attempt to quit: 05/19/2017    Years since quitting: 0.8  . Smokeless tobacco: Never Used  . Tobacco comment: smoking cessation information given  Substance and Sexual Activity  . Alcohol use: Not Currently    Comment: occasionally  . Drug use: No  . Sexual activity: Yes    Birth control/protection: Surgical  Lifestyle  . Physical activity:    Days per week: Not on file    Minutes per session: Not on file  . Stress: Not on file  Relationships  . Social connections:    Talks on phone: Not on file    Gets together: Not on file    Attends religious service: Not on file    Active member of club or organization: Not on file    Attends meetings of clubs or organizations:  Not on file    Relationship status: Not on file  . Intimate partner violence:    Fear of current or ex partner: Not on file    Emotionally abused: Not on file    Physically abused: Not on file    Forced sexual activity: Not on file  Other Topics Concern  . 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    Lives in [place], [place].   Engaged to be married.    Married in the past and has four kids.   Has two new puppies.     Allergies: Patient has no known allergies.  Medications: I have reviewed the patient's current medications.  Vital Signs: No data found.  Radiology: Dg Chest 2 View  Result Date: 03/16/2018 CLINICAL DATA:  Pre operative respiratory exam. EXAM:  CHEST - 2 VIEW COMPARISON:  08/19/2016 and 08/31/2015 and 05/13/2013 FINDINGS: The heart size and mediastinal contours are within normal limits. Both lungs are clear. The visualized skeletal structures are unremarkable. IMPRESSION: Normal exam. Electronically Signed   By: Lorriane Shire M.D.   On: 03/16/2018 14:49    Labs: No results for input(s): WBC, RBC, HCT, PLT in the last 72 hours. No results for input(s): NA, K, CL, CO2, BUN, CREATININE, GLUCOSE, CALCIUM in the last 72 hours. No results for input(s): LABPT, INR in the last 72 hours.  Review of Systems: ROS  Physical Exam: There is no height or weight on file to calculate BMI.  Physical Exam  Constitutional: She is oriented to person, place, and time. She appears well-developed and well-nourished.  HENT:  Head: Normocephalic.  Cardiovascular: Normal rate and regular rhythm.  Respiratory: Effort normal and breath sounds normal.  GI: Soft. Bowel sounds are normal.  Neurological: She is alert and oriented to person, place, and time.  Skin: Skin is warm and dry.  Psychiatric: She has a normal mood and affect. Her behavior is normal. Judgment and thought content normal.   Gait pattern: Patient has significant back pain which affects her overall gait pattern but she is stable.  Assistive devices: No assistive devices  Neuro: 5/5 strength in the lower extremities bilaterally.  She has numbness and dysesthesias in the L5 and S1 dermatome bilaterally.  Negative Babinski test, negative clonus, negative Hoffman test.  Negative nerve root tension signs in the lower extremity.  Musculoskeletal: Significant low back pain with palpation and range of motion.  Pain radiates from the middle of the lower lumbar spine out into the paraspinal muscles and into both lower extremities.  No significant SI joint with direct palpation.  No significant hip, knee, ankle pain with isolated joint range of motion.  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  No scoliosis.  Also significant degenerative disc disease at L5-S1.  MRI of the lumbar spine dated 01/21/18 demonstrates mild degenerative disease at L4-5 no significant canal stenosis.  There is bilateral L5 pars defects with a grade 2 approaching grade 3 anterolisthesis of L5 on S1.  Severe spinal canal stenosis.   Assessment and Plan: Risks and benefits of surgery were discussed with the patient. These include: Infection, bleeding, death, stroke, paralysis, ongoing or worse pain, need for additional surgery, nonunion, leak of spinal fluid, adjacent segment degeneration requiring additional fusion surgery, Injury to abdominal vessels that can require anterior surgery to stop bleeding. Malposition of the cage and/or  pedicle screws that could require additional surgery. Loss of bowel and bladder control. Postoperative hematoma causing neurologic compression that could require urgent or emergent re-operation.  Goals of surgery: Reduced (not eliminated) pain, and improved quality of life.  Ronette Deter, PAC for Melina Schools, MD [facility] [phone]  There is no change in the patient's exam from her last exam completed on 04/12/2018.  She continues to have severe back buttock and bilateral lower extremity pain.  Imaging studies demonstrate L5/S1 grade 3 isthmic spondylolisthesis with pars defect.  At this point my plan is to move forward with the open decompression and complete foraminotomy of L5-S1 to adequately decompress the entrapped L5 nerve root.  However because of the severe instability I would like to get an interbody fixation to maintain the foraminal decompression to provide the best opportunity to obtain and maintain adequate space for L5 nerve.  Given the slip angle I may need to place pedicle screws at L4 instead of L5 in order to have enough room to connect the posterior construct.  In great detail I went over this with the patient and  her significant other.  I stressed the fact that postoperatively she may have worse nerve pain to the point where a revision surgery may be required including even potentially an anterior lumbar interbody fusion.  However given her body habitus this would be my last resort, and it is because of her body habitus that I elected to move forward with a posterior based surgery.  I also went over the remaining risks as outlined above.  The consent was noted to be incorrect.  As a result I created a new consent to reflect the actual procedure.

## 2018-04-12 NOTE — Pre-Procedure Instructions (Signed)
April Ayala  04/12/2018       Your procedure is scheduled on Wednesday October 16.  Report to [facility] Admitting at 9:00 A.M.  Call this number if you have problems the morning of surgery:  [phone]   Remember:  Do not eat or drink after midnight.    Take these medicines the morning of surgery with A SIP OF WATER:   Bupropion (Wellbutrin) Tramadol (ultram) if needed Acetaminophen (Tylenol) if needed  7 days prior to surgery STOP taking any Aspirin(unless otherwise instructed by your surgeon), Aleve, Naproxen, Ibuprofen, Motrin, Advil, Goody's, BC's, all herbal medications, fish oil, and all vitamins     Do not wear jewelry, make-up or nail polish.  Do not wear lotions, powders, or perfumes, or deodorant.  Do not shave 48 hours prior to surgery.  Men may shave face and neck.  Do not bring valuables to the hospital.  [facility] is not responsible for any belongings or valuables.  Contacts, dentures or bridgework may not be worn into surgery.  Leave your suitcase in the car.  After surgery it may be brought to your room.  For patients admitted to the hospital, discharge time will be determined by your treatment team.  Patients discharged the day of surgery will not be allowed to drive home.   Special instructions:    [facility]- Preparing For Surgery  Before surgery, you can play an important role. Because skin is not sterile, your skin needs to be as free of germs as possible. You can reduce the number of germs on your skin by washing with CHG (chlorahexidine gluconate) Soap before surgery.  CHG is an antiseptic cleaner which kills germs and bonds with the skin to continue killing germs even after washing.    Oral Hygiene is also important to reduce your risk of infection.  Remember - BRUSH YOUR TEETH THE MORNING OF SURGERY WITH YOUR REGULAR TOOTHPASTE  Please do not use if you have an allergy to CHG or antibacterial soaps. If your skin becomes  reddened/irritated stop using the CHG.  Do not shave (including legs and underarms) for at least 48 hours prior to first CHG shower. It is OK to shave your face.  Please follow these instructions carefully.   1. Shower the NIGHT BEFORE SURGERY and the MORNING OF SURGERY with CHG.   2. If you chose to wash your hair, wash your hair first as usual with your normal shampoo.  3. After you shampoo, rinse your hair and body thoroughly to remove the shampoo.  4. Use CHG as you would any other liquid soap. You can apply CHG directly to the skin and wash gently with a scrungie or a clean washcloth.   5. Apply the CHG Soap to your body ONLY FROM THE NECK DOWN.  Do not use on open wounds or open sores. Avoid contact with your eyes, ears, mouth and genitals (private parts). Wash Face and genitals (private parts)  with your normal soap.  6. Wash thoroughly, paying special attention to the area where your surgery will be performed.  7. Thoroughly rinse your body with warm water from the neck down.  8. DO NOT shower/wash with your normal soap after using and rinsing off the CHG Soap.  9. Pat yourself dry with a CLEAN TOWEL.  10. Wear CLEAN PAJAMAS to bed the night before surgery, wear comfortable clothes the morning of surgery  11. Place CLEAN SHEETS on your bed the night of  your first shower and DO NOT SLEEP WITH PETS.    Day of Surgery:  Do not apply any deodorants/lotions.  Please wear clean clothes to the hospital/surgery center.   Remember to brush your teeth WITH YOUR REGULAR TOOTHPASTE.    Please read over the following fact sheets that you were given. Coughing and Deep Breathing and Surgical Site Infection Prevention

## 2018-04-13 ENCOUNTER — Other Ambulatory Visit: Payer: Self-pay

## 2018-04-13 ENCOUNTER — Encounter (HOSPITAL_COMMUNITY): Payer: Self-pay

## 2018-04-13 ENCOUNTER — Encounter (HOSPITAL_COMMUNITY)
Admission: RE | Admit: 2018-04-13 | Discharge: 2018-04-13 | Disposition: A | Discharge status: Home / Self Care | Payer: No Typology Code available for payment source | Source: Ambulatory Visit | Attending: Orthopedic Surgery | Admitting: Orthopedic Surgery

## 2018-04-13 DIAGNOSIS — Z01812 Encounter for preprocedural laboratory examination: Secondary | ICD-10-CM | POA: Diagnosis present

## 2018-04-13 LAB — CBC
HEMATOCRIT: 43.4 % (ref 36.0–46.0)
HEMOGLOBIN: 14.7 g/dL (ref 12.0–15.0)
MCH: 30.7 pg (ref 26.0–34.0)
MCHC: 33.9 g/dL (ref 30.0–36.0)
MCV: 90.6 fL (ref 80.0–100.0)
Platelets: 361 10*3/uL (ref 150–400)
RBC: 4.79 MIL/uL (ref 3.87–5.11)
RDW: 12.5 % (ref 11.5–15.5)
WBC: 8 10*3/uL (ref 4.0–10.5)
nRBC: 0 % (ref 0.0–0.2)

## 2018-04-13 LAB — SURGICAL PCR SCREEN
MRSA, PCR: NEGATIVE
Staphylococcus aureus: NEGATIVE

## 2018-04-13 NOTE — Progress Notes (Signed)
PCP: [facility]  DM: denies  Pt denies SOB, cough, fever, chest pain at PAT appointment.   Pt stated understanding of instructions given at PAT appointment for day of surgery.

## 2018-04-14 ENCOUNTER — Ambulatory Visit: Payer: BLUE CROSS/BLUE SHIELD | Admitting: Emergency Medicine

## 2018-04-19 ENCOUNTER — Encounter (HOSPITAL_COMMUNITY): Payer: Self-pay | Admitting: Certified Registered Nurse Anesthetist

## 2018-04-20 ENCOUNTER — Inpatient Hospital Stay (HOSPITAL_COMMUNITY)
Admission: RE | Disposition: A | Discharge status: Home Health | Payer: Self-pay | Source: Ambulatory Visit | Attending: Orthopedic Surgery

## 2018-04-20 ENCOUNTER — Inpatient Hospital Stay (HOSPITAL_COMMUNITY): Payer: No Typology Code available for payment source | Admitting: Certified Registered Nurse Anesthetist

## 2018-04-20 ENCOUNTER — Encounter (HOSPITAL_COMMUNITY): Payer: Self-pay

## 2018-04-20 ENCOUNTER — Inpatient Hospital Stay (HOSPITAL_COMMUNITY)
Admission: RE | Admit: 2018-04-20 | Discharge: 2018-04-22 | DRG: 460 | Disposition: A | Discharge status: Home Health | Payer: No Typology Code available for payment source | Source: Ambulatory Visit | Attending: Orthopedic Surgery | Admitting: Orthopedic Surgery

## 2018-04-20 ENCOUNTER — Inpatient Hospital Stay (HOSPITAL_COMMUNITY): Payer: No Typology Code available for payment source

## 2018-04-20 ENCOUNTER — Other Ambulatory Visit: Payer: Self-pay

## 2018-04-20 DIAGNOSIS — Z981 Arthrodesis status: Secondary | ICD-10-CM

## 2018-04-20 DIAGNOSIS — M4807 Spinal stenosis, lumbosacral region: Secondary | ICD-10-CM | POA: Diagnosis present

## 2018-04-20 DIAGNOSIS — Z8541 Personal history of malignant neoplasm of cervix uteri: Secondary | ICD-10-CM | POA: Diagnosis not present

## 2018-04-20 DIAGNOSIS — Z6841 Body Mass Index (BMI) 40.0 and over, adult: Secondary | ICD-10-CM | POA: Diagnosis not present

## 2018-04-20 DIAGNOSIS — Z419 Encounter for procedure for purposes other than remedying health state, unspecified: Secondary | ICD-10-CM

## 2018-04-20 DIAGNOSIS — Z87891 Personal history of nicotine dependence: Secondary | ICD-10-CM | POA: Diagnosis not present

## 2018-04-20 DIAGNOSIS — M4317 Spondylolisthesis, lumbosacral region: Principal | ICD-10-CM | POA: Diagnosis present

## 2018-04-20 HISTORY — PX: LUMBAR LAMINECTOMY/DECOMPRESSION MICRODISCECTOMY: SHX5026

## 2018-04-20 HISTORY — DX: Arthrodesis status: Z98.1

## 2018-04-20 LAB — TYPE AND SCREEN
ABO/RH(D): O NEG
Antibody Screen: NEGATIVE

## 2018-04-20 LAB — ABO/RH: ABO/RH(D): O NEG

## 2018-04-20 SURGERY — LUMBAR LAMINECTOMY/DECOMPRESSION MICRODISCECTOMY 2 LEVELS
Anesthesia: General

## 2018-04-20 MED ORDER — METOPROLOL TARTRATE 5 MG/5ML IV SOLN
INTRAVENOUS | Status: DC | PRN
  Administered 2018-04-20: 2 mg via INTRAVENOUS
  Administered 2018-04-20: 1 mg via INTRAVENOUS
  Administered 2018-04-20: 2 mg via INTRAVENOUS

## 2018-04-20 MED ORDER — HYDROMORPHONE HCL 1 MG/ML IJ SOLN
0.2500 mg | INTRAMUSCULAR | Status: DC | PRN
  Administered 2018-04-20 (×3): 0.5 mg via INTRAVENOUS

## 2018-04-20 MED ORDER — GLYCOPYRROLATE 0.2 MG/ML IJ SOLN
INTRAMUSCULAR | Status: DC | PRN
  Administered 2018-04-20: 0.2 mg via INTRAVENOUS

## 2018-04-20 MED ORDER — FENTANYL CITRATE (PF) 250 MCG/5ML IJ SOLN
INTRAMUSCULAR | Status: AC
  Filled 2018-04-20: qty 5

## 2018-04-20 MED ORDER — ONDANSETRON HCL 4 MG/2ML IJ SOLN
INTRAMUSCULAR | Status: AC
  Filled 2018-04-20: qty 2

## 2018-04-20 MED ORDER — PROPOFOL 1000 MG/100ML IV EMUL
INTRAVENOUS | Status: AC
  Filled 2018-04-20: qty 100

## 2018-04-20 MED ORDER — DEXAMETHASONE SODIUM PHOSPHATE 10 MG/ML IJ SOLN
INTRAMUSCULAR | Status: AC
  Filled 2018-04-20: qty 1

## 2018-04-20 MED ORDER — THROMBIN (RECOMBINANT) 20000 UNITS EX SOLR
CUTANEOUS | Status: AC
  Filled 2018-04-20: qty 20000

## 2018-04-20 MED ORDER — SCOPOLAMINE 1 MG/3DAYS TD PT72
MEDICATED_PATCH | TRANSDERMAL | Status: DC | PRN
  Administered 2018-04-20: 1 via TRANSDERMAL

## 2018-04-20 MED ORDER — DEXAMETHASONE SODIUM PHOSPHATE 10 MG/ML IJ SOLN
INTRAMUSCULAR | Status: DC | PRN
  Administered 2018-04-20: 10 mg via INTRAVENOUS

## 2018-04-20 MED ORDER — CEFAZOLIN SODIUM-DEXTROSE 2-4 GM/100ML-% IV SOLN
2.0000 g | Freq: Three times a day (TID) | INTRAVENOUS | Status: AC
  Administered 2018-04-21 – 2018-04-22 (×4): 2 g via INTRAVENOUS
  Filled 2018-04-20 (×4): qty 100

## 2018-04-20 MED ORDER — SCOPOLAMINE 1 MG/3DAYS TD PT72
MEDICATED_PATCH | TRANSDERMAL | Status: AC
  Filled 2018-04-20: qty 1

## 2018-04-20 MED ORDER — MIDAZOLAM HCL 2 MG/2ML IJ SOLN
INTRAMUSCULAR | Status: AC
  Filled 2018-04-20: qty 2

## 2018-04-20 MED ORDER — POLYETHYLENE GLYCOL 3350 17 G PO PACK: 17.0000 g | PACK | Freq: Every day | ORAL | Status: DC | PRN

## 2018-04-20 MED ORDER — ACETAMINOPHEN 325 MG PO TABS
650.0000 mg | ORAL_TABLET | ORAL | Status: DC | PRN
  Administered 2018-04-20 – 2018-04-22 (×4): 650 mg via ORAL
  Filled 2018-04-20 (×4): qty 2

## 2018-04-20 MED ORDER — SODIUM CHLORIDE 0.9 % IV SOLN
0.0500 ug/kg/min | INTRAVENOUS | Status: AC
  Administered 2018-04-20: 0.1 ug/kg/min via INTRAVENOUS
  Filled 2018-04-20: qty 4000

## 2018-04-20 MED ORDER — GLYCOPYRROLATE PF 0.2 MG/ML IJ SOSY
PREFILLED_SYRINGE | INTRAMUSCULAR | Status: AC
  Filled 2018-04-20: qty 1

## 2018-04-20 MED ORDER — ONDANSETRON HCL 4 MG/2ML IJ SOLN: 4.0000 mg | Freq: Four times a day (QID) | INTRAMUSCULAR | Status: DC | PRN

## 2018-04-20 MED ORDER — THROMBIN 20000 UNITS EX SOLR
CUTANEOUS | Status: DC | PRN
  Administered 2018-04-20: 20 mL via TOPICAL

## 2018-04-20 MED ORDER — SUCCINYLCHOLINE CHLORIDE 200 MG/10ML IV SOSY
PREFILLED_SYRINGE | INTRAVENOUS | Status: AC
  Filled 2018-04-20: qty 10

## 2018-04-20 MED ORDER — PROPOFOL 500 MG/50ML IV EMUL
INTRAVENOUS | Status: DC | PRN
  Administered 2018-04-20 (×2): via INTRAVENOUS
  Administered 2018-04-20: 75 ug/kg/min via INTRAVENOUS

## 2018-04-20 MED ORDER — SUGAMMADEX SODIUM 200 MG/2ML IV SOLN: INTRAVENOUS | Status: DC | PRN

## 2018-04-20 MED ORDER — MAGNESIUM CITRATE PO SOLN
1.0000 | Freq: Once | ORAL | Status: AC | PRN
  Administered 2018-04-22: 1 via ORAL
  Filled 2018-04-20: qty 296

## 2018-04-20 MED ORDER — LACTATED RINGERS IV SOLN
INTRAVENOUS | Status: DC | PRN
  Administered 2018-04-20 (×2): via INTRAVENOUS

## 2018-04-20 MED ORDER — BUPIVACAINE-EPINEPHRINE (PF) 0.25% -1:200000 IJ SOLN
INTRAMUSCULAR | Status: DC | PRN
  Administered 2018-04-20: 10 mL

## 2018-04-20 MED ORDER — DOCUSATE SODIUM 100 MG PO CAPS
100.0000 mg | ORAL_CAPSULE | Freq: Two times a day (BID) | ORAL | Status: DC
  Administered 2018-04-20 – 2018-04-22 (×4): 100 mg via ORAL
  Filled 2018-04-20 (×4): qty 1

## 2018-04-20 MED ORDER — MENTHOL 3 MG MT LOZG
1.0000 | LOZENGE | OROMUCOSAL | Status: DC | PRN
  Administered 2018-04-21: 3 mg via ORAL
  Filled 2018-04-20: qty 9

## 2018-04-20 MED ORDER — CEFAZOLIN SODIUM-DEXTROSE 2-4 GM/100ML-% IV SOLN
INTRAVENOUS | Status: AC
  Filled 2018-04-20: qty 100

## 2018-04-20 MED ORDER — ONDANSETRON HCL 4 MG/2ML IJ SOLN
INTRAMUSCULAR | Status: DC | PRN
  Administered 2018-04-20 (×2): 4 mg via INTRAVENOUS

## 2018-04-20 MED ORDER — ALBUMIN HUMAN 5 % IV SOLN
INTRAVENOUS | Status: DC | PRN
  Administered 2018-04-20 (×2): via INTRAVENOUS

## 2018-04-20 MED ORDER — CEFAZOLIN SODIUM 1 G IJ SOLR
INTRAMUSCULAR | Status: AC
  Filled 2018-04-20: qty 30

## 2018-04-20 MED ORDER — MIDAZOLAM HCL 2 MG/2ML IJ SOLN
INTRAMUSCULAR | Status: DC | PRN
  Administered 2018-04-20 (×2): 1 mg via INTRAVENOUS

## 2018-04-20 MED ORDER — SODIUM CHLORIDE 0.9% FLUSH: 3.0000 mL | INTRAVENOUS | Status: DC | PRN

## 2018-04-20 MED ORDER — 0.9 % SODIUM CHLORIDE (POUR BTL) OPTIME
TOPICAL | Status: DC | PRN
  Administered 2018-04-20: 1000 mL

## 2018-04-20 MED ORDER — CEFAZOLIN SODIUM-DEXTROSE 2-4 GM/100ML-% IV SOLN
2.0000 g | INTRAVENOUS | Status: AC
  Administered 2018-04-20 (×2): 2 g via INTRAVENOUS
  Administered 2018-04-20: 1 g via INTRAVENOUS

## 2018-04-20 MED ORDER — ROCURONIUM BROMIDE 50 MG/5ML IV SOSY
PREFILLED_SYRINGE | INTRAVENOUS | Status: AC
  Filled 2018-04-20: qty 5

## 2018-04-20 MED ORDER — MORPHINE SULFATE (PF) 2 MG/ML IV SOLN
1.0000 mg | INTRAVENOUS | Status: DC | PRN
  Administered 2018-04-20: 1 mg via INTRAVENOUS
  Filled 2018-04-20: qty 1

## 2018-04-20 MED ORDER — ARTIFICIAL TEARS OPHTHALMIC OINT
TOPICAL_OINTMENT | OPHTHALMIC | Status: DC | PRN
  Administered 2018-04-20: 1 via OPHTHALMIC

## 2018-04-20 MED ORDER — SODIUM CHLORIDE 0.9 % IV SOLN: 250.0000 mL | INTRAVENOUS | Status: DC

## 2018-04-20 MED ORDER — LIDOCAINE 2% (20 MG/ML) 5 ML SYRINGE
INTRAMUSCULAR | Status: AC
  Filled 2018-04-20: qty 5

## 2018-04-20 MED ORDER — PROPOFOL 10 MG/ML IV BOLUS
INTRAVENOUS | Status: AC
  Filled 2018-04-20: qty 20

## 2018-04-20 MED ORDER — METHOCARBAMOL 1000 MG/10ML IJ SOLN
500.0000 mg | Freq: Four times a day (QID) | INTRAVENOUS | Status: DC | PRN
  Filled 2018-04-20: qty 5

## 2018-04-20 MED ORDER — ACETAMINOPHEN 10 MG/ML IV SOLN
INTRAVENOUS | Status: AC
  Filled 2018-04-20: qty 100

## 2018-04-20 MED ORDER — HYDROMORPHONE HCL 1 MG/ML IJ SOLN
INTRAMUSCULAR | Status: AC
  Administered 2018-04-20: 0.5 mg via INTRAVENOUS
  Filled 2018-04-20: qty 1

## 2018-04-20 MED ORDER — PROMETHAZINE HCL 25 MG/ML IJ SOLN: 6.2500 mg | INTRAMUSCULAR | Status: DC | PRN

## 2018-04-20 MED ORDER — SODIUM CHLORIDE 0.9 % IV SOLN
INTRAVENOUS | Status: DC | PRN
  Administered 2018-04-20: 25 ug/min via INTRAVENOUS
  Administered 2018-04-20: 15:00:00 via INTRAVENOUS

## 2018-04-20 MED ORDER — GABAPENTIN 600 MG PO TABS
300.0000 mg | ORAL_TABLET | Freq: Two times a day (BID) | ORAL | Status: DC
  Administered 2018-04-20 – 2018-04-22 (×4): 300 mg via ORAL
  Filled 2018-04-20 (×4): qty 1

## 2018-04-20 MED ORDER — BUPIVACAINE-EPINEPHRINE (PF) 0.25% -1:200000 IJ SOLN
INTRAMUSCULAR | Status: AC
  Filled 2018-04-20: qty 30

## 2018-04-20 MED ORDER — PROPOFOL 10 MG/ML IV BOLUS
INTRAVENOUS | Status: DC | PRN
  Administered 2018-04-20: 200 mg via INTRAVENOUS

## 2018-04-20 MED ORDER — LIDOCAINE IN D5W 4-5 MG/ML-% IV SOLN
INTRAVENOUS | Status: DC | PRN
  Administered 2018-04-20: 1 mg/min via INTRAVENOUS

## 2018-04-20 MED ORDER — LIDOCAINE HCL (CARDIAC) PF 100 MG/5ML IV SOSY
PREFILLED_SYRINGE | INTRAVENOUS | Status: DC | PRN
  Administered 2018-04-20: 80 mg via INTRATRACHEAL

## 2018-04-20 MED ORDER — FENTANYL CITRATE (PF) 250 MCG/5ML IJ SOLN
INTRAMUSCULAR | Status: DC | PRN
  Administered 2018-04-20 (×2): 50 ug via INTRAVENOUS
  Administered 2018-04-20 (×2): 100 ug via INTRAVENOUS
  Administered 2018-04-20: 50 ug via INTRAVENOUS
  Administered 2018-04-20: 150 ug via INTRAVENOUS

## 2018-04-20 MED ORDER — OXYCODONE HCL 5 MG PO TABS
10.0000 mg | ORAL_TABLET | ORAL | Status: DC | PRN
  Administered 2018-04-20 – 2018-04-22 (×10): 10 mg via ORAL
  Filled 2018-04-20 (×10): qty 2

## 2018-04-20 MED ORDER — METHOCARBAMOL 500 MG PO TABS
500.0000 mg | ORAL_TABLET | Freq: Four times a day (QID) | ORAL | Status: DC | PRN
  Administered 2018-04-20 – 2018-04-22 (×6): 500 mg via ORAL
  Filled 2018-04-20 (×6): qty 1

## 2018-04-20 MED ORDER — ONDANSETRON HCL 4 MG PO TABS: 4.0000 mg | ORAL_TABLET | Freq: Four times a day (QID) | ORAL | Status: DC | PRN

## 2018-04-20 MED ORDER — SODIUM CHLORIDE 0.9% FLUSH
3.0000 mL | Freq: Two times a day (BID) | INTRAVENOUS | Status: DC
  Administered 2018-04-22 (×2): 3 mL via INTRAVENOUS

## 2018-04-20 MED ORDER — ACETAMINOPHEN 650 MG RE SUPP: 650.0000 mg | RECTAL | Status: DC | PRN

## 2018-04-20 MED ORDER — SUCCINYLCHOLINE CHLORIDE 20 MG/ML IJ SOLN
INTRAMUSCULAR | Status: DC | PRN
  Administered 2018-04-20: 140 mg via INTRAVENOUS

## 2018-04-20 MED ORDER — BUPROPION HCL ER (XL) 300 MG PO TB24: 300.0000 mg | ORAL_TABLET | Freq: Every day | ORAL | Status: DC

## 2018-04-20 MED ORDER — LACTATED RINGERS IV SOLN: INTRAVENOUS | Status: DC

## 2018-04-20 MED ORDER — PHENOL 1.4 % MT LIQD
1.0000 | OROMUCOSAL | Status: DC | PRN
  Administered 2018-04-21: 1 via OROMUCOSAL
  Filled 2018-04-20: qty 177

## 2018-04-20 MED ORDER — OXYCODONE HCL 5 MG PO TABS: 5.0000 mg | ORAL_TABLET | ORAL | Status: DC | PRN

## 2018-04-20 SURGICAL SUPPLY — 79 items
BUR EGG ELITE 4.0 (BURR) IMPLANT
BUR EGG ELITE 4.0MM (BURR)
BUR MATCHSTICK NEURO 3.0 LAGG (BURR) IMPLANT
CAGE POST IBF 8X8D 22/9 (Cage) ×3 IMPLANT
CLIP NEUROVISION LG (CLIP) ×3 IMPLANT
CLOSURE STERI-STRIP 1/2X4 (GAUZE/BANDAGES/DRESSINGS) ×1
CLSR STERI-STRIP ANTIMIC 1/2X4 (GAUZE/BANDAGES/DRESSINGS) ×2 IMPLANT
CONNECTOR RELINE 45-65 5.5 (Connector) ×3 IMPLANT
CORD BI POLAR (MISCELLANEOUS) ×3 IMPLANT
COVER WAND RF STERILE (DRAPES) ×3 IMPLANT
DRAIN CHANNEL 15F RND FF W/TCR (WOUND CARE) IMPLANT
DRAPE INCISE IOBAN 66X45 STRL (DRAPES) ×3 IMPLANT
DRAPE MICROSCOPE LEICA 46X105 (MISCELLANEOUS) IMPLANT
DRAPE POUCH INSTRU U-SHP 10X18 (DRAPES) ×3 IMPLANT
DRAPE SURG 17X11 SM STRL (DRAPES) ×3 IMPLANT
DRAPE U-SHAPE 47X51 STRL (DRAPES) ×3 IMPLANT
DRSG OPSITE POSTOP 4X6 (GAUZE/BANDAGES/DRESSINGS) IMPLANT
DRSG OPSITE POSTOP 4X8 (GAUZE/BANDAGES/DRESSINGS) ×3 IMPLANT
DURAPREP 26ML APPLICATOR (WOUND CARE) ×3 IMPLANT
ELECT BLADE 4.0 EZ CLEAN MEGAD (MISCELLANEOUS)
ELECT CAUTERY BLADE 6.4 (BLADE) ×3 IMPLANT
ELECT PENCIL ROCKER SW 15FT (MISCELLANEOUS) ×3 IMPLANT
ELECT REM PT RETURN 9FT ADLT (ELECTROSURGICAL) ×3
ELECTRODE BLDE 4.0 EZ CLN MEGD (MISCELLANEOUS) IMPLANT
ELECTRODE REM PT RTRN 9FT ADLT (ELECTROSURGICAL) ×1 IMPLANT
EVACUATOR SILICONE 100CC (DRAIN) IMPLANT
FLOSEAL 10ML (HEMOSTASIS) IMPLANT
GLOVE BIO SURGEON STRL SZ 6.5 (GLOVE) ×2 IMPLANT
GLOVE BIO SURGEONS STRL SZ 6.5 (GLOVE) ×1
GLOVE BIOGEL PI IND STRL 6.5 (GLOVE) ×1 IMPLANT
GLOVE BIOGEL PI IND STRL 8.5 (GLOVE) ×1 IMPLANT
GLOVE BIOGEL PI INDICATOR 6.5 (GLOVE) ×2
GLOVE BIOGEL PI INDICATOR 8.5 (GLOVE) ×2
GLOVE SS BIOGEL STRL SZ 8.5 (GLOVE) ×1 IMPLANT
GLOVE SUPERSENSE BIOGEL SZ 8.5 (GLOVE) ×2
GOWN STRL REUS W/ TWL LRG LVL3 (GOWN DISPOSABLE) ×1 IMPLANT
GOWN STRL REUS W/TWL 2XL LVL3 (GOWN DISPOSABLE) ×6 IMPLANT
GOWN STRL REUS W/TWL LRG LVL3 (GOWN DISPOSABLE) ×2
GUIDEWIRE NITINOL BEVEL TIP (WIRE) ×12 IMPLANT
KIT BASIN OR (CUSTOM PROCEDURE TRAY) ×3 IMPLANT
MODULE EMG NEEDLE SSEP NVM5 (NEEDLE) ×3 IMPLANT
MODULE NVM5 NEXT GEN EMG (NEEDLE) ×3 IMPLANT
NEEDLE 22X1 1/2 (OR ONLY) (NEEDLE) ×3 IMPLANT
NEEDLE I-PASS III (NEEDLE) ×3 IMPLANT
NEEDLE SPNL 18GX3.5 QUINCKE PK (NEEDLE) ×6 IMPLANT
NS IRRIG 1000ML POUR BTL (IV SOLUTION) ×3 IMPLANT
PACK LAMINECTOMY ORTHO (CUSTOM PROCEDURE TRAY) ×3 IMPLANT
PACK UNIVERSAL I (CUSTOM PROCEDURE TRAY) ×3 IMPLANT
PATTIES SURGICAL .5 X.5 (GAUZE/BANDAGES/DRESSINGS) ×6 IMPLANT
PATTIES SURGICAL .5 X1 (DISPOSABLE) ×3 IMPLANT
PROBE BALL TIP NVM5 SNG USE (BALLOONS) ×6 IMPLANT
REDUCTION EXT RELINE MAS MOD (Neuro Prosthesis/Implant) ×15 IMPLANT
ROD RELINE MAS LORD 5.5X45MM (Rod) ×3 IMPLANT
ROD RELINE MAS LORD 5.5X50 (Rod) ×3 IMPLANT
RUBBERBAND STERILE (MISCELLANEOUS) IMPLANT
SCREW LOCK RELINE 5.5 TULIP (Screw) ×18 IMPLANT
SCREW SHANK RELINE 6.5X45MM 2C (Screw) ×3 IMPLANT
SCREW SHANK RELINE 7.5X40MM 2C (Screw) ×6 IMPLANT
SCREW SHANK RELINE MOD 7.5X45 (Screw) ×4 IMPLANT
SCREW SHANK RLINE MD 7.5X45 2C (Screw) ×2 IMPLANT
SPONGE LAP 4X18 RFD (DISPOSABLE) ×21 IMPLANT
SPONGE SURGIFOAM ABS GEL 100 (HEMOSTASIS) ×3 IMPLANT
STAPLER VISISTAT 35W (STAPLE) IMPLANT
SUT BONE WAX W31G (SUTURE) ×3 IMPLANT
SUT MON AB 3-0 SH 27 (SUTURE) ×2
SUT MON AB 3-0 SH27 (SUTURE) ×1 IMPLANT
SUT VIC AB 1 CT1 18XCR BRD 8 (SUTURE) ×2 IMPLANT
SUT VIC AB 1 CT1 27 (SUTURE)
SUT VIC AB 1 CT1 27XBRD ANTBC (SUTURE) IMPLANT
SUT VIC AB 1 CT1 8-18 (SUTURE) ×4
SUT VIC AB 2-0 CT1 18 (SUTURE) ×3 IMPLANT
SUT VICRYL 0 UR6 27IN ABS (SUTURE) ×3 IMPLANT
SYR BULB IRRIGATION 50ML (SYRINGE) ×3 IMPLANT
SYR CONTROL 10ML LL (SYRINGE) ×3 IMPLANT
TOWEL GREEN STERILE (TOWEL DISPOSABLE) ×3 IMPLANT
TOWEL GREEN STERILE FF (TOWEL DISPOSABLE) ×3 IMPLANT
TRAY FOLEY MTR SLVR 16FR STAT (SET/KITS/TRAYS/PACK) ×3 IMPLANT
WATER STERILE IRR 1000ML POUR (IV SOLUTION) ×3 IMPLANT
YANKAUER SUCT BULB TIP NO VENT (SUCTIONS) ×3 IMPLANT

## 2018-04-20 NOTE — Anesthesia Procedure Notes (Signed)
Procedure Name: Intubation Date/Time: 04/20/2018 11:36 AM Performed by: Duane Boston, MD Pre-anesthesia Checklist: Patient identified, Emergency Drugs available, Suction available and Patient being monitored Patient Re-evaluated:Patient Re-evaluated prior to induction Oxygen Delivery Method: Circle system utilized Preoxygenation: Pre-oxygenation with 100% oxygen Induction Type: IV induction Laryngoscope Size: Mac and 3 Grade View: Grade I Tube type: Oral Tube size: 7.0 mm Number of attempts: 1 Airway Equipment and Method: Stylet Placement Confirmation: ETT inserted through vocal cords under direct vision,  positive ETCO2 and breath sounds checked- equal and bilateral Secured at: 22 cm Tube secured with: Tape Dental Injury: Teeth and Oropharynx as per pre-operative assessment

## 2018-04-20 NOTE — Transfer of Care (Signed)
Immediate Anesthesia Transfer of Care Note  Patient: April Ayala  Procedure(s) Performed: LUMBAR FIVE TO SACRAL ONE DECOMPRESSION AND FUSION, POSSIBLE LUMBAR FOUR TO SACRAL ONE; SCREW AND CAGE INSTRUMENTATION (N/A )  Patient Location: PACU  Anesthesia Type:General  Level of Consciousness: awake, alert  and oriented  Airway & Oxygen Therapy: Patient Spontanous Breathing and Patient connected to nasal cannula oxygen  Post-op Assessment: Report given to RN and Post -op Vital signs reviewed and stable  Post vital signs: Reviewed and stable  Last Vitals:  Vitals Value Taken Time  BP 124/79 04/20/2018  7:13 PM  Temp 37.3 C 04/20/2018  7:13 PM  Pulse 117 04/20/2018  7:19 PM  Resp 18 04/20/2018  7:19 PM  SpO2 99 % 04/20/2018  7:19 PM  Vitals shown include unvalidated device data.  Last Pain:  Vitals:   04/20/18 0853  TempSrc:   PainSc: 4       Patients Stated Pain Goal: 2 (67/67/20 9470)  Complications: No apparent anesthesia complications

## 2018-04-20 NOTE — Discharge Instructions (Signed)
Spinal Fusion, Care After °These instructions give you information about caring for yourself after your procedure. Your doctor may also give you more specific instructions. Call your doctor if you have any problems or questions after your procedure. °Follow these instructions at home: °Medicines °· Take over-the-counter and prescription medicines only as told by your doctor. These include any medicines for pain. °· Do not drive for 24 hours if you received a sedative. °· Do not drive or use heavy machinery while taking prescription pain medicine. °· If you were prescribed an antibiotic medicine, take it as told by your doctor. Do not stop taking the antibiotic even if you start to feel better. °Surgical Cut (Incision) Care °· Follow instructions from your doctor about how to take care of your surgical cut. Make sure you: °? Wash your hands with soap and water before you change your bandage (dressing). If you cannot use soap and water, use hand sanitizer. °? Change your bandage as told by your doctor. °? Leave stitches (sutures), skin glue, or skin tape (adhesive) strips in place. They may need to stay in place for 2 weeks or longer. If tape strips get loose and curl up, you may trim the loose edges. Do not remove tape strips completely unless your doctor says it is okay. °· Keep your surgical cut clean and dry. Do not take baths, swim, or use a hot tub until your doctor says it is okay. °· Check your surgical cut and the area around it every day for: °? Redness. °? Swelling. °? Fluid. °Physical Activity °· Return to your normal activities as told by your doctor. Ask your doctor what activities are safe for you. Rest and protect your back as much as you can. °· Follow instructions from your doctor about how to move. Use good posture to help your spine heal. °· Do not lift anything that is heavier than 8 lb (3.6 kg) or as told by your doctor until he or she says that it is safe. Do not lift anything over your  head. °· Do not twist or bend at the waist until your doctor says it is okay. °· Avoid pushing or pulling motions. °· Do not sit or lie down in the same position for long periods of time. °· Do not start to exercise until your doctor says it is okay. Ask your doctor what kinds of exercise you can do to make your back stronger. °General instructions °· If you were given a brace, use it as told by your doctor. °· Wear compression stockings as told by your doctor. °· Do not use tobacco products. These include cigarettes, chewing tobacco, or e-cigarettes. If you need help quitting, ask your doctor. °· Keep all follow-up visits as told by your doctor. This is important. This includes any visits with your physical therapist, if this applies. °Contact a doctor if: °· Your pain gets worse. °· Your medicine does not help your pain. °· Your legs or feet become painful or swollen. °· Your surgical cut is red, swollen, or painful. °· You have fluid, blood, or pus coming from your surgical cut. °· You feel sick to your stomach (nauseous). °· You throw up (vomit). °· Your have weakness or loss of feeling (numbness) in your legs that is new or getting worse. °· You have a fever. °· You have trouble controlling when you pee (urinate) or poop (have a bowel movement). °Get help right away if: °· Your pain is very bad. °· You have   chest pain. °· You have trouble breathing. °· You start to have a cough. °These symptoms may be an emergency. Do not wait to see if the symptoms will go away. Get medical help right away. Call your local emergency services (911 in the [place] Do not drive yourself to the hospital. °This information is not intended to replace advice given to you by your health care provider. Make sure you discuss any questions you have with your health care provider. °Document Released: 10/16/2010 Document Revised: 02/18/2016 Document Reviewed: 12/05/2014 °Elsevier Interactive Patient Education © 2018 [employer] ° °

## 2018-04-20 NOTE — OR Nursing (Signed)
Family notified of surgery status at 14.

## 2018-04-20 NOTE — Anesthesia Preprocedure Evaluation (Signed)
Anesthesia Evaluation  Patient identified by MRN, date of birth, ID band Patient awake    Reviewed: Allergy & Precautions, NPO status , Patient's Chart, lab work & pertinent test results  History of Anesthesia Complications Negative for: history of anesthetic complications  Airway Mallampati: II  TM Distance: >3 FB Neck ROM: Full    Dental no notable dental hx. (+) Dental Advisory Given   Pulmonary former smoker,    Pulmonary exam normal        Cardiovascular negative cardio ROS Normal cardiovascular exam     Neuro/Psych  Headaches, negative psych ROS   GI/Hepatic negative GI ROS, Neg liver ROS,   Endo/Other  Morbid obesity  Renal/GU negative Renal ROS  negative genitourinary   Musculoskeletal negative musculoskeletal ROS (+)   Abdominal   Peds negative pediatric ROS (+)  Hematology negative hematology ROS (+)   Anesthesia Other Findings   Reproductive/Obstetrics negative OB ROS                             Anesthesia Physical Anesthesia Plan  ASA: III  Anesthesia Plan: General   Post-op Pain Management:    Induction: Intravenous  PONV Risk Score and Plan: 3 and Ondansetron, Dexamethasone and Scopolamine patch - Pre-op  Airway Management Planned: Oral ETT  Additional Equipment:   Intra-op Plan:   Post-operative Plan: Extubation in OR  Informed Consent: I have reviewed the patients History and Physical, chart, labs and discussed the procedure including the risks, benefits and alternatives for the proposed anesthesia with the patient or authorized representative who has indicated his/her understanding and acceptance.   Dental advisory given  Plan Discussed with: CRNA, Anesthesiologist and Surgeon  Anesthesia Plan Comments:         Anesthesia Quick Evaluation

## 2018-04-20 NOTE — OR Nursing (Signed)
Family notified of surgery status at 1501.

## 2018-04-20 NOTE — Brief Op Note (Signed)
04/20/2018  6:44 PM  PATIENT:  April Ayala  42 y.o. female  PRE-OPERATIVE DIAGNOSIS:  L5-S1 slip with spinal stenosis  POST-OPERATIVE DIAGNOSIS:  L5-S1 slip with spinal stenosis  PROCEDURE:  Procedure(s) with comments: LUMBAR FIVE TO SACRAL ONE DECOMPRESSION AND FUSION, POSSIBLE LUMBAR FOUR TO SACRAL ONE; SCREW AND CAGE INSTRUMENTATION (N/A) - 5 hrs  SURGEON:  Surgeon(s) and Role:    Melina Schools, MD - Primary  PHYSICIAN ASSISTANT:   ASSISTANTS: Carmen Mayo   ANESTHESIA:   general  EBL:  350 mL   BLOOD ADMINISTERED:none  DRAINS: 1 JP drain   LOCAL MEDICATIONS USED:  MARCAINE     SPECIMEN:  No Specimen  DISPOSITION OF SPECIMEN:  N/A  COUNTS:  YES  TOURNIQUET:  * No tourniquets in log *  DICTATION: .Dragon Dictation  PLAN OF CARE: Admit to inpatient   PATIENT DISPOSITION:  PACU - hemodynamically stable.

## 2018-04-20 NOTE — Op Note (Signed)
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  Postoperative diagnosis: Same  Operative procedure: Gill decompression L5-S1 with complete resection of the facet bilaterally.      Interbody fusion L5-S1    Posterior instrumented fusion with pedicle screw fixation L5-S1  First assistant: Ronette Deter, PA  Complications: None  Intraoperative neuro monitoring: No adverse events were recorded.  No abnormal free running EMGs in the L5 or S1 dermatome were noted.  SSEPs and evoked motor potentials in the lower and upper extremities remained normal with no adverse responses.  Pedicle screws were all stimulated directly with no evidence of breach based on stimulation.  All screws stimulated above 13 mA.  Implants:  Depuy Conduit 28mmx22 mm lordotic cellular titanium cage   Nuvasive pedicle screw: L5: 7.5 x 24mm.  S1: 7.5x69mm.  Cross link  Graft: Autograft from decompression and DBX  Indications: This is a very pleasant 42 year old morbidly obese woman who is having significant back bilateral leg pain right worse than the left.  Imaging studies demonstrated severe foraminal stenosis and central stenosis at L5-S1 secondary to a high-grade isthmic spondylolisthesis.  Attempts at conservative management failed to alleviate her symptoms so elected to proceed with surgery.  All appropriate risks benefits and alternatives including nerve damage and chronic neuropathic pain were discussed with the patient and her husband.  Consent was obtained.  Operative report: Patient was brought the operating room placed upon the operating table.  After successful induction of general anesthesia and endotracheal intubation teds SCDs and a Foley were inserted.  She was turned prone onto the axis table and the neuro monitoring representative placed all appropriate monitoring devices for intraoperative evoked  motor, SSEP, and free running EMG monitoring.  Once the patient was properly positioned the bag was placed into a kyphotic position.  Is maximized the foraminal decompression.  X-rays were taken to identify the incision site and the back was prepped and draped in a standard fashion.  Timeout was taken to confirm patient procedure and all other important data.  Once this was completed a midline incision was made and sharp dissection was carried out down to the deep fascia.  I incised the deep fascia and continued my dissection.  I was able to palpate the spinous process of L4 stripped the paraspinal muscles to expose the lamina of L4.  I then moved inferiorly bluntly dissecting until I could palpate the bony structure I removed and expose the L5 lamina and then L4-5 facet complex.  I then went inferiorly to expose the S1 5 process and lamina as well as the L5-S1 facet complex.  Once this was done bilaterally I placed my self-retaining retractors for better visualization.  At this point I could clearly visualize the significant slip.  At this point I started working on my decompression.  Once I confirmed the appropriate level with fluoroscopy and then began resecting inferior L5 facet.  Using an osteotome I resected this facet which allowed me to begin to the anatomical landmarks.  Because of the significant central stenosis I elected to start working out lateral so I could decrease the potential risk of iatrogenic durotomy.  Working out laterally I was able to remove the L5 lamina and the leading edge of the S1 lamina.  I ultimately was able to identify the superior portion of the S1 pedicle.  I then continued line decompression inferiorly by performing S1 foraminotomy so I could  now clearly visualize the medial superior and I could palpate the inferior aspect of the S1 pedicle.  The S1 nerve root was also visualized.  At this point with the right S1 level identified I could now continue to work laterally removing  the remainder of the L5 facet and taking down the remainder of the pars.  Great care was taken as I was mobilizing towards the elbow for 5 facet is not to injure the nerve.  I ultimately was able to palpate the L5 pedicle with my [facility] confirming I had a complete foraminotomy performed.  The entire L5 pars and facet were removed.  I could now visualize the posterior lateral epidural veins these were coagulated with bipolar electrocautery to facilitate visualization of the posterior lateral disc space.  At this point I now had visualization of the thecal sac.  I noted that the as started to mobilize the remainder of the lamina it was very adherent to the underlying thecal sac.  I could not easily mobilize this.  I also attempted to come centrally and worked my way out laterally but again it was very difficult.  However the remaining posterior elements (central portion of the spinous process and the remaining portion of the L5 lamina were completely free on the right side.  There were no longer attached to the pars or to the facet.  At this point I elected to leave that and continue my posterior lateral decompression during the course of performing this decompression the lateral recess I directly stimulated the area to identify the L5 nerve and to ensure that I was not traumatizing.  This was done using the neuro monitoring probe.  I also monitor the free running EMGs to ensure there was no injury to the V nerve root.  Once I completed the posterior lateral decompression and foraminotomy of L5 on the right side I went to the contralateral side using the same technique I performed a foraminotomy on this side.  At this point with the lateral aspect of the lamina of L5 on the left side released and the facet removed and the remaining pars resected central portion of the bony canal was now completely free floating fragment.  I attempted again to try and elevate this and resected but it was very adherent to  the underlying ligamentum flavum and even possibly the underlying thecal sac.  At this point since the fragment was now free-floating felt as though the performed an adequate hearing a very significant durotomy and significant potential complications I elected to leave the actual bony elements.  At this point I then placed my pedicle screws.  At S1 I used direct visualization.  I could palpate and visualize the medial and superior borders of the S1 pedicle and so I placed my starting spot.  Because of the angle and a small stab incision lateral and then advanced it through that down to the open wound and into the pedicle.  I repeated this on the contralateral side.  I again advanced the pedicle probe to the medial wall of the pedicle and confirmed satisfactory position in both the AP and lateral planes.  During insertion with the Jamshidi needle I directly stimulated to ensure I was not causing any neural injury once I confirmed satisfactory position in both planes I then advanced the Jamshidi needle into the vertebral body of S1.  This was repeated on the contralateral side.  A second stab incision and placed the L5 pedicle screws as well.  X-rays demonstrate satisfactory positioning of all 4 guidepins into the pedicles.  Guidepin was Using fluoroscopy and direct neural stimulation and then the screws were placed.  All screws were directly stimulated and there was no adverse activity below 13 mA.  With all 4 pedicle screws now in place I now moved forward with the discectomy.  I placed the rod on the left-hand side distracted the pedicle screw construct and locked them in place.  Using an osteotome I resected the overhanging osteophyte from the superior aspect of the S1 vertebral body.  I could now visualize the posterior disc.  An annulotomy was then performed with a 15 blade scalpel.  I then secured it was only in the disc space.  Using pituitary rongeurs I resected as much of the disc material as I could  safely do.  This was very difficult given the severity of the spondylolisthesis.  At one point I felt as though there may have been excessive traction on the L5 nerve root and possible neural neural injury.  However there was no change in the SSEPs or evoked motor potentials nor was there any adverse activity specifically in the L5 and S1 dermatome.  As such I continued with the discectomy.  At this point with the discectomy with Dr. do completed I then used the trial devices and elected to use the size 7 mm x 22 mm lordotic cage.  I was unable to angle this gave but I was able to get it into the disc space and countersunk.  X-rays demonstrated at the intervertebral cage was lateral on the right side.  Since she had predominantly right leg pain I elected to keep the position of the intervertebral cage.  With the cage in place I then applied the polyaxial heads and then applied the appropriate size rods and locked them in place.  I then took the traction off the left side and reposition the rod and locked the slightly shorter rod in place.  At this point I also place a cross-link for added stability.  Given the extensive procedure I did not feel it showed putting any interbody cage on the left side was warranted  This w would prolong surgical case chronic decrease the risk of neural injury, and her predominant pain was on the right not the left.  With the complete foraminotomy I felt as though the left L5 nerve root was adequately decompressed.  I could visualize the nerve and I was able to pass my nerve hook along the L5 pedicle on the left side superiorly.  The foramen itself was completely decompressed and there was adequate space for the nerve.  Final x-rays were satisfactory, and final EMG/SSEP/evoked motor potentials were normal. I then irrigated the wound copiously with normal saline.  I then placed thrombin-soaked Gelfoam patty over the right foraminotomy side as this is where the more extensive  decompression was done to place the intervertebral cage.  On the left-hand side hemostasis was achieved using using bipolar electrocautery.  I did place a deep drain and brought out a separate stab incision.  The wound was then closed in a layered fashion with interrupted #1 Vicryl suture, 2-0 Vicryl suture, 3-0 Monocryl.  Stab incisions for the past screws placed were also irrigated and closed in a layered fashion..  The end of the case all needle and sponge counts were correct.  At the end of the case patient was ultimately extubated transfer the PACU that incident

## 2018-04-21 ENCOUNTER — Encounter (HOSPITAL_COMMUNITY): Payer: Self-pay | Admitting: Orthopedic Surgery

## 2018-04-21 LAB — GLUCOSE, CAPILLARY: GLUCOSE-CAPILLARY: 290 mg/dL — AB (ref 70–99)

## 2018-04-21 MED ORDER — GABAPENTIN 300 MG PO CAPS: 300.0000 mg | ORAL_CAPSULE | Freq: Two times a day (BID) | ORAL | 11 refills | Status: DC

## 2018-04-21 MED ORDER — METHOCARBAMOL 500 MG PO TABS: 500.0000 mg | ORAL_TABLET | Freq: Four times a day (QID) | ORAL | 0 refills | Status: DC

## 2018-04-21 MED ORDER — ONDANSETRON 4 MG PO TBDP: 4.0000 mg | ORAL_TABLET | Freq: Three times a day (TID) | ORAL | 0 refills | Status: DC | PRN

## 2018-04-21 MED ORDER — OXYCODONE-ACETAMINOPHEN 10-325 MG PO TABS: 1.0000 | ORAL_TABLET | Freq: Four times a day (QID) | ORAL | 0 refills | Status: AC | PRN

## 2018-04-21 MED FILL — Thrombin (Recombinant) For Soln 20000 Unit: CUTANEOUS | Qty: 1 | Status: AC

## 2018-04-21 NOTE — Evaluation (Signed)
Occupational Therapy Evaluation and Discharge Patient Details Name: April Ayala MRN: [id number] DOB: [birth year] Today's Date: 04/21/2018    History of Present Illness  Posterior instrumented fusion with pedicle screw fixation L5-S1   Clinical Impression   This 42 yo female admitted and underwent above presents to acute OT with all education completed, we will D/C from acute OT.    Follow Up Recommendations  No OT follow up;Supervision - Intermittent    Equipment Recommendations  3 in 1 bedside commode;Tub/shower seat;Other (comment)(reacher, sock aid, long handled sponge, long handled shoe horn)       Precautions / Restrictions Precautions Precautions: Back Precaution Booklet Issued: Yes (comment) Required Braces or Orthoses: Spinal Brace Spinal Brace: Lumbar corset;Applied in sitting position Restrictions Weight Bearing Restrictions: No      Mobility Bed Mobility Overal bed mobility: Needs Assistance Bed Mobility: Rolling;Sidelying to Sit;Sit to Sidelying Rolling: Supervision Sidelying to sit: Supervision     Sit to sidelying: Supervision General bed mobility comments: VCs for technique  Transfers Overall transfer level: Needs assistance Equipment used: Rolling walker (2 wheeled) Transfers: Sit to/from Stand Sit to Stand: Supervision                  ADL either performed or assessed with clinical judgement   ADL                                         General ADL Comments: Educated pt on use of 2 cups for oral care, use of wet wipes with toilet aid for peri care, not sitting more than 20-30 minutes at a time, use of AE for LBD, stance for sit<>stand when there is nothing to push up from, side stepping or sitting on side of tub (she says hers is higher and wider than normal) to turn into tub and then stand up to sit on seat.     Vision Patient Visual Report: No change from baseline              Pertinent Vitals/Pain Pain  Assessment: 0-10 Pain Score: 5  Pain Location: incisonal Pain Descriptors / Indicators: Sore Pain Intervention(s): Limited activity within patient's tolerance;Monitored during session     Hand Dominance Right   Extremity/Trunk Assessment Upper Extremity Assessment Upper Extremity Assessment: Overall WFL for tasks assessed           Communication Communication Communication: No difficulties   Cognition Arousal/Alertness: Awake/alert Behavior During Therapy: WFL for tasks assessed/performed Overall Cognitive Status: Within Functional Limits for tasks assessed                                                Home Living Family/patient expects to be discharged to:: Private residence Living Arrangements: Spouse/significant other;Children Available Help at Discharge: Family;Available 24 hours/day Type of Home: House             Bathroom Shower/Tub: Tub/shower unit;Curtain   Bathroom Toilet: Standard     Home Equipment: Hand held shower head          Prior Functioning/Environment Level of Independence: Independent                 OT Problem List: Decreased range of motion;Pain;Obesity;Decreased knowledge of use of DME or AE  OT Goals(Current goals can be found in the care plan section) Acute Rehab OT Goals Patient Stated Goal: to go home today  OT Frequency:                AM-PAC PT "6 Clicks" Daily Activity     Outcome Measure Help from another person eating meals?: None Help from another person taking care of personal grooming?: None Help from another person toileting, which includes using toliet, bedpan, or urinal?: None Help from another person bathing (including washing, rinsing, drying)?: None Help from another person to put on and taking off regular upper body clothing?: None Help from another person to put on and taking off regular lower body clothing?: None 6 Click Score: 24   End of Session Equipment Utilized  During Treatment: Rolling walker;Back brace Nurse Communication: (pt asking for throat losenges, DME/AE needs written on sheet)  Activity Tolerance: Patient tolerated treatment well Patient left: (sitting EOB)  OT Visit Diagnosis: Pain Pain - Right/Left: (incisional) Pain - part of body: (back)                Time: 5697-9480 OT Time Calculation (min): 58 min Charges:  OT General Charges $OT Visit: 1 Visit OT Evaluation $OT Eval Moderate Complexity: 1 Mod OT Treatments $Self Care/Home Management : 38-52 mins  [address], OTR/L Acute [employer] Pager [phone] Office [phone]

## 2018-04-21 NOTE — Evaluation (Signed)
Physical Therapy Evaluation Patient Details Name: April Ayala MRN: [id number] DOB: 1975/11/28 Today's Date: 04/21/2018   History of Present Illness  Pt is a 42 y/o female s/p L5-S1 decompression and fusion. PMH including but not limited to cervical cancer.  Clinical Impression  Pt presented sitting EOB, awake and willing to participate in therapy session. Prior to admission, pt reported that she was independent with all functional mobility and ADLs. Pt will have 24/7 supervision/assistance upon d/c. Pt currently at supervision level for transfers and ambulation with RW. Pt required min guard for stair training with use of hand rails. PT reviewed 3/3 back precautions with pt throughout. PT also discussed a generalized walking program for pt to initiate upon d/c. No further acute PT needs identified at this time. PT signing off.    Follow Up Recommendations No PT follow up;Supervision - Intermittent    Equipment Recommendations  Rolling walker with 5" wheels;3in1 (PT)    Recommendations for Other Services       Precautions / Restrictions Precautions Precautions: Back Precaution Comments: reviewed 3/3 back precautions with pt throughout Required Braces or Orthoses: Spinal Brace Spinal Brace: Lumbar corset;Applied in sitting position Restrictions Weight Bearing Restrictions: No      Mobility  Bed Mobility               General bed mobility comments: pt sitting EOB upon arrival  Transfers Overall transfer level: Needs assistance Equipment used: Rolling walker (2 wheeled) Transfers: Sit to/from Stand Sit to Stand: Supervision         General transfer comment: for safety  Ambulation/Gait Ambulation/Gait assistance: Supervision Gait Distance (Feet): 500 Feet Assistive device: Rolling walker (2 wheeled) Gait Pattern/deviations: Step-through pattern;Decreased stride length Gait velocity: decreased   General Gait Details: pt steady with RW, using only for  balance (not really bearing down hard with UEs), supervision for safety  Stairs Stairs: Yes Stairs assistance: Min guard Stair Management: One rail Left;Step to pattern;Sideways Number of Stairs: 2 General stair comments: min guard for safety, cueing for technique, heavy reliance on bilateral UEs on railing  Wheelchair Mobility    Modified Rankin (Stroke Patients Only)       Balance Overall balance assessment: Needs assistance Sitting-balance support: Feet supported Sitting balance-Leahy Scale: Good     Standing balance support: Bilateral upper extremity supported Standing balance-Leahy Scale: Poor                               Pertinent Vitals/Pain Pain Assessment: 0-10 Pain Score: 7  Pain Location: back Pain Descriptors / Indicators: Sore Pain Intervention(s): Monitored during session;Repositioned    Home Living Family/patient expects to be discharged to:: Private residence Living Arrangements: Spouse/significant other;Children Available Help at Discharge: Family;Available 24 hours/day Type of Home: House Home Access: Stairs to enter Entrance Stairs-Rails: Psychiatric nurse of Steps: 2 Home Layout: One level Home Equipment: Hand held shower head      Prior Function Level of Independence: Independent               Hand Dominance        Extremity/Trunk Assessment   Upper Extremity Assessment Upper Extremity Assessment: Overall WFL for tasks assessed    Lower Extremity Assessment Lower Extremity Assessment: Overall WFL for tasks assessed    Cervical / Trunk Assessment Cervical / Trunk Assessment: Other exceptions Cervical / Trunk Exceptions: s/p lumbar sx  Communication   Communication: No difficulties  Cognition Arousal/Alertness: Awake/alert  Behavior During Therapy: WFL for tasks assessed/performed Overall Cognitive Status: Within Functional Limits for tasks assessed                                         General Comments      Exercises     Assessment/Plan    PT Assessment Patent does not need any further PT services  PT Problem List Decreased strength;Decreased activity tolerance;Decreased mobility;Decreased balance;Decreased coordination;Decreased knowledge of use of DME;Decreased safety awareness;Decreased knowledge of precautions;Pain       PT Treatment Interventions      PT Goals (Current goals can be found in the Care Plan section)  Acute Rehab PT Goals Patient Stated Goal: to go home today    Frequency     Barriers to discharge        Co-evaluation               AM-PAC PT "6 Clicks" Daily Activity  Outcome Measure Difficulty turning over in bed (including adjusting bedclothes, sheets and blankets)?: A Little Difficulty moving from lying on back to sitting on the side of the bed? : A Little Difficulty sitting down on and standing up from a chair with arms (e.g., wheelchair, bedside commode, etc,.)?: Unable Help needed moving to and from a bed to chair (including a wheelchair)?: None Help needed walking in hospital room?: None Help needed climbing 3-5 steps with a railing? : A Little 6 Click Score: 18    End of Session Equipment Utilized During Treatment: Back brace Activity Tolerance: Patient tolerated treatment well Patient left: in chair;with call bell/phone within reach;with family/visitor present Nurse Communication: Mobility status PT Visit Diagnosis: Other abnormalities of gait and mobility (R26.89);Pain Pain - part of body: (back)    Time: 1021-1173 PT Time Calculation (min) (ACUTE ONLY): 16 min   Charges:   PT Evaluation $PT Eval Low Complexity: 1 Low          Sherie Don, PT, DPT  Acute Rehabilitation Services Pager [phone] Office [place] 04/21/2018, 2:19 PM

## 2018-04-21 NOTE — Care Management Note (Addendum)
Case Management Note  Patient Details  Name: April Ayala MRN: [id number] Date of Birth: 1976/01/01  Subjective/Objective:     Posterior instrumented fusion with pedicle screw fixation L5-S1                Action/Plan: NCM spoke to pt at bedside. Provided contact for [employer] # [phone], or [phone] claim # [id number]. Contacted Petra Kuba and left HIPAA compliant voice message.  330 pm Attempted second call to CW and left message for return call.    04/22/2018 Contacted Brad WC CW, fax [phone], stated DME goes through [phone]. States he will send orders to [employer] once received. Requested NCM follow up with [employer] this am on local DME provided that will deliver to room prior to dc.   Expected Discharge Date:              Expected Discharge Plan:  Home/Self Care  In-House Referral:  NA  Discharge planning Services  CM Consult  Post Acute Care Choice:  Durable Medical Equipment Choice offered to:  Patient  DME Arranged:  3-N-1, Walker rolling, Shower stool, Other see comment DME Agency:  [place]:  NA [place] Agency:  NA  Status of Service:  In process, will continue to follow  If discussed at Long Length of Stay Meetings, dates discussed:    Additional Comments:  Erenest Rasher, RN 04/21/2018, 1:51 PM

## 2018-04-21 NOTE — Progress Notes (Signed)
    Subjective: 1 Day Post-Op Procedure(s) (LRB): LUMBAR FIVE TO SACRAL ONE DECOMPRESSION AND FUSION, POSSIBLE LUMBAR FOUR TO SACRAL ONE; SCREW AND CAGE INSTRUMENTATION (N/A) Patient reports pain as 4 on 0-10 scale.   Denies CP or SOB.  Voiding without difficulty. Positive flatus. Objective: Vital signs in last 24 hours: Temp:  [97.9 F (36.6 C)-99.1 F (37.3 C)] 98.7 F (37.1 C) (10/17 0707) Pulse Rate:  [89-117] 92 (10/17 0707) Resp:  [13-21] 20 (10/17 0707) BP: (95-133)/(67-102) 114/71 (10/17 0707) SpO2:  [95 %-100 %] 96 % (10/17 0707) Weight:  [104.8 kg] 104.8 kg (10/16 0843)  Intake/Output from previous day: 10/16 0701 - 10/17 0700 In: 4140 [P.O.:240; I.V.:3000; IV Piggyback:850] Out: 7356 [Urine:2775; Drains:370; Stool:1; Blood:350] Intake/Output this shift: No intake/output data recorded.  Labs: No results for input(s): HGB in the last 72 hours. No results for input(s): WBC, RBC, HCT, PLT in the last 72 hours. No results for input(s): NA, K, CL, CO2, BUN, CREATININE, GLUCOSE, CALCIUM in the last 72 hours. No results for input(s): LABPT, INR in the last 72 hours.  Physical Exam: Sensation intact distally Dorsiflexion/Plantar flexion intact Incision: drain still in place. 250 cc overnight Compartment soft Body mass index is 40.92 kg/m.   Assessment/Plan: 1 Day Post-Op Procedure(s) (LRB): LUMBAR FIVE TO SACRAL ONE DECOMPRESSION AND FUSION, POSSIBLE LUMBAR FOUR TO SACRAL ONE; SCREW AND CAGE INSTRUMENTATION (N/A) Advance diet Up with therapy Plan for discharge tomorrow  Dio Giller, Darla Lesches for Dr. Melina Schools [place] [phone] 04/21/2018, 7:29 AM

## 2018-04-21 NOTE — Social Work (Signed)
Acknowledging SNF consult, plan for pt to discharge with [place].   CSW signing off. Please consult if any additional needs arise.  [place], [place] Work [phone]

## 2018-04-22 NOTE — Anesthesia Postprocedure Evaluation (Signed)
Anesthesia Post Note  Patient: April Ayala  Procedure(s) Performed: LUMBAR FIVE TO SACRAL ONE DECOMPRESSION AND FUSION, POSSIBLE LUMBAR FOUR TO SACRAL ONE; SCREW AND CAGE INSTRUMENTATION (N/A )     Patient location during evaluation: PACU Anesthesia Type: General Level of consciousness: awake and alert Pain management: pain level controlled Vital Signs Assessment: post-procedure vital signs reviewed and stable Respiratory status: spontaneous breathing, nonlabored ventilation, respiratory function stable and patient connected to nasal cannula oxygen Cardiovascular status: blood pressure returned to baseline and stable Postop Assessment: no apparent nausea or vomiting Anesthetic complications: no    Last Vitals:  Vitals:   04/22/18 0504 04/22/18 0705  BP: 96/63 112/71  Pulse: (!) 101 (!) 101  Resp: 20 18  Temp: 37.4 C 37.1 C  SpO2: 97% 96%    Last Pain:  Vitals:   04/22/18 0705  TempSrc: Oral  PainSc:                  [place] S

## 2018-04-22 NOTE — Progress Notes (Signed)
Per Dr. Rolena Infante came to see pt.  Her drain output was 15cc this am.  Pulled drain.  injected lidocaine around incision.  Put two 4cc Monocryl sutures in the incision.  Pt tolerated procedure.  Covered wound.  Reviewed discharge instructions.  Meds on chart.  We will see her in 2 weeks in clinic   [place], [facility]

## 2018-04-22 NOTE — Progress Notes (Signed)
    Subjective: Procedure(s) (LRB): LUMBAR FIVE TO SACRAL ONE DECOMPRESSION AND FUSION, POSSIBLE LUMBAR FOUR TO SACRAL ONE; SCREW AND CAGE INSTRUMENTATION (N/A) 2 Days Post-Op  Patient reports pain as 3 on 0-10 scale.  Reports decreased leg pain reports incisional back pain   Positive void Negative bowel movement Positive flatus Negative chest pain or shortness of breath  Objective: Vital signs in last 24 hours: Temp:  [98.5 F (36.9 C)-100.3 F (37.9 C)] 99.3 F (37.4 C) (10/18 0504) Pulse Rate:  [92-117] 101 (10/18 0504) Resp:  [18-20] 20 (10/18 0504) BP: (96-120)/(54-71) 96/63 (10/18 0504) SpO2:  [95 %-99 %] 97 % (10/18 0504)  Intake/Output from previous day: 10/17 0701 - 10/18 0700 In: 200 [IV Piggyback:200] Out: 260 [Drains:260]  Labs: No results for input(s): WBC, RBC, HCT, PLT in the last 72 hours. No results for input(s): NA, K, CL, CO2, BUN, CREATININE, GLUCOSE, CALCIUM in the last 72 hours. No results for input(s): LABPT, INR in the last 72 hours.  Physical Exam: Neurologically intact ABD soft Intact pulses distally Incision: dressing C/D/I Compartment soft Body mass index is 40.92 kg/m.   Assessment/Plan: Patient stable  xrays n/a Continue mobilization with physical therapy Continue care  Patient is doing well overall.  Drain output 70 cc total from overnight. 1.  If the drain output remains below 70 cc then will remove drain around noon.  After the drain is removed more than likely the drain site may require a second stitch to reapproximate the edges.  If so this can be done at the bedside. 2.  If she has not had a bowel movement then we will address this with with fleets enema and mag citrate. 3.  Continue mobilization and ambulation. 4.  Plan on discharge later today or first thing in the morning. 5.  She will follow-up with me in 2 weeks for reevaluation of the wound.  Melina Schools, MD [facility] [phone]

## 2018-04-22 NOTE — Progress Notes (Signed)
NCM spoke to [place] with [employer] and states DME request has to be processed and auth given. Waiting auth from [place] on DME, paperwork was faxed to [phone].

## 2018-04-22 NOTE — Progress Notes (Signed)
Patient is discharged from room 3C09 at this time. Alert and in stable condition. IV site d/c'd and instructions read to patient and spouse with understanding verbalized. Spoke with Danae Chen at [facility] about DME and they will be delivered to patient's address. Patient aware and agreed. Left unit via wheelchair with all belongings at side.

## 2018-04-25 NOTE — Discharge Summary (Signed)
Physician Discharge Summary  Patient ID: April Ayala MRN: [id number] DOB/AGE: [birth year] 42 y.o.  Admit date: 04/20/2018 Discharge date: 04/22/18  Admission Diagnoses:  Lumbar L5-S1 spondylolisthesis  Discharge Diagnoses:  Active Problems:   S/P lumbar fusion   Past Medical History:  Diagnosis Date  . Cervical cancer ([place]) 04/2011   Stage 1B squamous cell  . History of kidney stones     Surgeries: Procedure(s): LUMBAR FIVE TO SACRAL ONE DECOMPRESSION AND FUSION, POSSIBLE LUMBAR FOUR TO SACRAL ONE; SCREW AND CAGE INSTRUMENTATION on 04/20/2018   Consultants (if any):   Discharged Condition: Improved  Hospital Course: April Ayala is an 42 y.o. female who was admitted 04/20/2018 with a diagnosis of Lumbar L5-S1 spondylolisthesis and went to the operating room on 04/20/2018 and underwent the above named procedures.  Post op day one pt reports moderate incisional pain.  Pt reports decreased leg pain.  Pt is voiding w/o difficulty.  Pt is ambulating in hallway. Post op day 2 pt reports moderate incisional pain.  Pt drain output had decreased and the drain was pulled in the afternoon. Pt was cleared by PT and OT for DC. Rolling walker and 3-1 was recommended and ordered.   She was given perioperative antibiotics:  Anti-infectives (From admission, onward)   Start     Dose/Rate Route Frequency Ordered Stop   04/21/18 0100  ceFAZolin (ANCEF) IVPB 2g/100 mL premix     2 g 200 mL/hr over 30 Minutes Intravenous Every 8 hours 04/20/18 2007 04/22/18 1659   04/20/18 0833  ceFAZolin (ANCEF) 2-4 GM/100ML-% IVPB    Note to Pharmacy:  Gustavo Lah   : cabinet override      04/20/18 0833 04/20/18 1144   04/20/18 0826  ceFAZolin (ANCEF) IVPB 2g/100 mL premix     2 g 200 mL/hr over 30 Minutes Intravenous 30 min pre-op 04/20/18 0826 04/20/18 1657    .  She was given sequential compression devices, early ambulation, and TED for DVT prophylaxis.  She benefited maximally from the  hospital stay and there were no complications.    Recent vital signs:  Vitals:   04/22/18 0705 04/22/18 1125  BP: 112/71 (!) 117/55  Pulse: (!) 101 83  Resp: 18 19  Temp: 98.7 F (37.1 C) 99.5 F (37.5 C)  SpO2: 96% 98%    Recent laboratory studies:  Lab Results  Component Value Date   HGB 14.7 04/13/2018   HGB 14.9 02/23/2018   HGB 13.9 01/21/2018   Lab Results  Component Value Date   WBC 8.0 04/13/2018   PLT 361 04/13/2018   No results found for: INR Lab Results  Component Value Date   NA 135 01/21/2018   K 3.6 01/21/2018   CL 102 01/21/2018   CO2 26 01/21/2018   BUN 9 01/21/2018   CREATININE 0.82 01/21/2018   GLUCOSE 99 01/21/2018    Discharge Medications:   Allergies as of 04/22/2018   No Known Allergies     Medication List    STOP taking these medications   acetaminophen 650 MG CR tablet Commonly known as:  TYLENOL   cyclobenzaprine 10 MG tablet Commonly known as:  FLEXERIL   naproxen 500 MG tablet Commonly known as:  NAPROSYN   traMADol 50 MG tablet Commonly known as:  ULTRAM     TAKE these medications   buPROPion 300 MG 24 hr tablet Commonly known as:  WELLBUTRIN XL Take 1 tablet (300 mg total) by mouth daily.   gabapentin 300 MG  capsule Commonly known as:  NEURONTIN Take 1 capsule (300 mg total) by mouth 2 (two) times daily.   methocarbamol 500 MG tablet Commonly known as:  ROBAXIN Take 1 tablet (500 mg total) by mouth 4 (four) times daily.   ondansetron 4 MG disintegrating tablet Commonly known as:  ZOFRAN-ODT Take 1 tablet (4 mg total) by mouth every 8 (eight) hours as needed.   oxyCODONE-acetaminophen 10-325 MG tablet Commonly known as:  PERCOCET Take 1 tablet by mouth every 6 (six) hours as needed for up to 5 days for pain.       Diagnostic Studies: Dg Lumbar Spine 2-3 Views  Result Date: 04/20/2018 CLINICAL DATA:  Fluoroscopic intraoperative localization for L5-S1 decompression and fusion. EXAM: DG C-ARM 61-120 MIN;  LUMBAR SPINE - 2-3 VIEW COMPARISON:  Prior CT from 12/13/2017. FINDINGS: PA and lateral intraoperative fluoroscopic views from [facility] decompression at L5-S1. Bilateral transpedicular screws in place at L5 and S1 with associated air locule rods. Interbody device in place within the L5-S1 disc space. Anterolisthesis of L5 on S1 appears slightly improved from previous CT. IMPRESSION: Fluoroscopic intraoperative localization for posterior decompression and fusion at L5-S1. Electronically Signed   By: Jeannine Boga M.D.   On: 04/20/2018 20:09   Dg C-arm 1-60 Min  Result Date: 04/20/2018 CLINICAL DATA:  Fluoroscopic intraoperative localization for L5-S1 decompression and fusion. EXAM: DG C-ARM 61-120 MIN; LUMBAR SPINE - 2-3 VIEW COMPARISON:  Prior CT from 12/13/2017. FINDINGS: PA and lateral intraoperative fluoroscopic views from [facility] decompression at L5-S1. Bilateral transpedicular screws in place at L5 and S1 with associated air locule rods. Interbody device in place within the L5-S1 disc space. Anterolisthesis of L5 on S1 appears slightly improved from previous CT. IMPRESSION: Fluoroscopic intraoperative localization for posterior decompression and fusion at L5-S1. Electronically Signed   By: Jeannine Boga M.D.   On: 04/20/2018 20:09   Dg C-arm 1-60 Min  Result Date: 04/20/2018 CLINICAL DATA:  Fluoroscopic intraoperative localization for L5-S1 decompression and fusion. EXAM: DG C-ARM 61-120 MIN; LUMBAR SPINE - 2-3 VIEW COMPARISON:  Prior CT from 12/13/2017. FINDINGS: PA and lateral intraoperative fluoroscopic views from [facility] decompression at L5-S1. Bilateral transpedicular screws in place at L5 and S1 with associated air locule rods. Interbody device in place within the L5-S1 disc space. Anterolisthesis of L5 on S1 appears slightly improved from previous CT. IMPRESSION: Fluoroscopic intraoperative localization for posterior decompression and fusion at L5-S1. Electronically Signed   By: Jeannine Boga M.D.   On: 04/20/2018 20:09   Dg C-arm 1-60 Min  Result Date: 04/20/2018 CLINICAL DATA:  Fluoroscopic intraoperative localization for L5-S1 decompression and fusion. EXAM: DG C-ARM 61-120 MIN; LUMBAR SPINE - 2-3 VIEW COMPARISON:  Prior CT from 12/13/2017. FINDINGS: PA and lateral intraoperative fluoroscopic views from [facility] decompression at L5-S1. Bilateral transpedicular screws in place at L5 and S1 with associated air locule rods. Interbody device in place within the L5-S1 disc space. Anterolisthesis of L5 on S1 appears slightly improved from previous CT. IMPRESSION: Fluoroscopic intraoperative localization for posterior decompression and fusion at L5-S1. Electronically Signed   By: Jeannine Boga M.D.   On: 04/20/2018 20:09    Disposition:  Post op meds provided Pt will present to clinic in 2 weeks Discharge Instructions    Incentive spirometry RT   Complete by:  As directed       Follow-up Information    Melina Schools, MD Follow up in 2 week(s).   Specialty:  Orthopedic Surgery Contact information: [address] [place] [address] [phone]            Signed: Valinda Hoar 04/25/2018, 10:36 AM

## 2018-05-19 NOTE — H&P (Signed)
April Ayala is an 42 y.o. female.   Chief Complaint: Numbness right hand HPI: April Ayala is a 42 year old female seen in the past for numbness in her right upper extremity.She in the past has had numbness and pain in both hands right worse than left.  EMGs were ordered which demonstrates moderate to severe right carpal tunnel syndrome and severe left carpal tunnel syndrome more symptomatic on the right with pain.    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  She is now to the point where she is having great difficulty with activities and her work and would like to proceed with a carpal tunnel release.  Past Medical History:  Diagnosis Date  . Cervical cancer ([place]) 04/2011   Stage 1B squamous cell  . History of kidney stones     Past Surgical History:  Procedure Laterality Date  . ABDOMINAL HYSTERECTOMY    . BREAST BIOPSY    . CERVICAL CONIZATION W/BX  03/24/2012   Procedure: CONIZATION CERVIX WITH BIOPSY;  Surgeon: Woodroe Mode, MD;  Location: [facility];  Service: Gynecology;  Laterality: N/A;  . DIAGNOSTIC LAPAROSCOPY  2007   ectopic  . ECTOPIC PREGNANCY SURGERY    . ESSURE TUBAL LIGATION  2010  . INSERTION OF SUPRAPUBIC CATHETER  05/03/2012   Procedure: INSERTION OF SUPRAPUBIC CATHETER;  Surgeon: Alvino Chapel, MD;  Location: [facility];  Service: Gynecology;;  . LEEP    . LUMBAR LAMINECTOMY/DECOMPRESSION MICRODISCECTOMY N/A 04/20/2018   Procedure: LUMBAR FIVE TO SACRAL ONE DECOMPRESSION AND FUSION, POSSIBLE LUMBAR FOUR TO SACRAL ONE; SCREW AND CAGE INSTRUMENTATION;  Surgeon: Melina Schools, MD;  Location: [facility];  Service: Orthopedics;  Laterality: N/A;  5 hrs  . LYMPHADENECTOMY  05/03/2012   Procedure: LYMPHADENECTOMY;  Surgeon: Alvino Chapel, MD;  Location: [facility];  Service: Gynecology;  Laterality: Bilateral;  pelvic   . RADICAL HYSTERECTOMY  05/03/2012   Procedure: RADICAL HYSTERECTOMY;  Surgeon: Alvino Chapel, MD;  Location: [facility];  Service: Gynecology;;  transposition of the ovaries  . TUBAL LIGATION      Family History  Problem Relation Age of Onset  . Diabetes Mother   . Hypertension Father   . Diabetes Sister   . Hypothyroidism Sister    Social History:  reports that she quit smoking about a year ago. Her smoking use included cigarettes. She has a 19.00 pack-year smoking history. She has never used smokeless tobacco. She reports that she drank alcohol. She reports that she does not use drugs.  Allergies: No Known Allergies  No medications prior to admission.   No current facility-administered medications for this encounter.    Current Outpatient Medications  Medication Sig Dispense Refill  . cyclobenzaprine (FLEXERIL) 10 MG tablet Take 10 mg by mouth 3 (three) times daily as needed for muscle spasms.    Marland Kitchen gabapentin (NEURONTIN) 300 MG capsule Take 1 capsule (300 mg total) by mouth 2 (two) times daily. (Patient taking differently: Take 300 mg by mouth 3 (three) times daily. ) 60 capsule 11  . nabumetone (RELAFEN) 500 MG tablet Take 500 mg by mouth 2 (two) times daily.  0  . ondansetron (ZOFRAN) 4 MG tablet Take 4 mg by mouth 2 (two) times daily as needed for nausea/vomiting.  0  . oxyCODONE-acetaminophen (PERCOCET) 10-325 MG tablet Take 1 tablet by mouth 3 (three) times daily as needed for pain.  0  . buPROPion (WELLBUTRIN XL) 300  MG 24 hr tablet Take 1 tablet (300 mg total) by mouth daily. (Patient not taking: Reported on 04/07/2018) 30 tablet 1  . methocarbamol (ROBAXIN) 500 MG tablet Take 1 tablet (500 mg total) by mouth 4 (four) times daily. (Patient not taking: Reported on 05/19/2018) 30 tablet 0     No results found for this or any previous visit (from the past 48 hour(s)). No results found.  Review of Systems  Constitutional: Negative for fatigue and fever.  HENT: Negative for ear pain.   Eyes: Negative for pain.  Respiratory: Negative for cough and shortness  of breath.   Cardiovascular: Negative for leg swelling.  Gastrointestinal: Negative for constipation and diarrhea.  Genitourinary: Negative for difficulty urinating.  Musculoskeletal: Negative for back pain and neck pain.  Skin: Negative for rash.  Allergic/Immunologic: Negative for food allergies.  Neurological: Positive for weakness and numbness.  Hematological: Does not bruise/bleed easily.  Psychiatric/Behavioral: Positive for sleep disturbance.    Last menstrual period 04/11/2012.   Physical Exam  Constitutional: She appears well-developed and well-nourished.  HENT:  Head: Normocephalic and atraumatic.  Eyes: Pupils are equal, round, and reactive to light. Conjunctivae are normal.  Neck: Neck supple.  Cardiovascular: Normal rate, regular rhythm, normal heart sounds and intact distal pulses.  Respiratory: Effort normal and breath sounds normal.  GI: Soft. Bowel sounds are normal.  Neurological: She is alert.  Skin: Skin is warm and dry.  Psychiatric: She has a normal mood and affect. Her behavior is normal. Judgment and thought content normal.  Musculoskeletal: She does have a positive Tinel's and Phalen's test both wrists.  No edema at this time.  Assessment/Plan #1: Bilateral carpal tunnel syndrome right worse than left  Plan is to perform a right carpal tunnel release.  Biagio Borg, PA-C 05/19/2018, 6:39 PM

## 2018-05-23 ENCOUNTER — Encounter (HOSPITAL_COMMUNITY): Payer: Self-pay | Admitting: Certified Registered"

## 2018-05-23 ENCOUNTER — Other Ambulatory Visit: Payer: Self-pay

## 2018-05-23 ENCOUNTER — Encounter (HOSPITAL_COMMUNITY): Payer: Self-pay | Admitting: *Deleted

## 2018-05-23 NOTE — Progress Notes (Signed)
April Ayala is scheduled for right hand carpal tunnel, she is requesting that left be done first, I left a message on Dr Rudene Anda scheduler, Jackelyn Poling. Patient also said that she cannot arrive until 0830, I left this message for Jackelyn Poling and informed Rolla Flatten, RN.

## 2018-05-24 ENCOUNTER — Encounter (HOSPITAL_COMMUNITY): Admission: RE | Payer: Self-pay | Source: Ambulatory Visit

## 2018-05-24 ENCOUNTER — Ambulatory Visit (HOSPITAL_COMMUNITY)
Admission: RE | Admit: 2018-05-24 | Payer: PRIVATE HEALTH INSURANCE | Source: Ambulatory Visit | Admitting: Orthopaedic Surgery

## 2018-05-24 SURGERY — CARPAL TUNNEL RELEASE
Anesthesia: Regional | Laterality: Right

## 2018-05-24 MED ORDER — MIDAZOLAM HCL 2 MG/2ML IJ SOLN
INTRAMUSCULAR | Status: AC
  Filled 2018-05-24: qty 2

## 2018-05-24 MED ORDER — PROPOFOL 10 MG/ML IV BOLUS
INTRAVENOUS | Status: AC
  Filled 2018-05-24: qty 20

## 2018-05-24 MED ORDER — PHENYLEPHRINE 40 MCG/ML (10ML) SYRINGE FOR IV PUSH (FOR BLOOD PRESSURE SUPPORT)
PREFILLED_SYRINGE | INTRAVENOUS | Status: AC
  Filled 2018-05-24: qty 10

## 2018-05-24 MED ORDER — FENTANYL CITRATE (PF) 250 MCG/5ML IJ SOLN
INTRAMUSCULAR | Status: AC
  Filled 2018-05-24: qty 5

## 2018-05-24 MED ORDER — DEXAMETHASONE SODIUM PHOSPHATE 10 MG/ML IJ SOLN
INTRAMUSCULAR | Status: AC
  Filled 2018-05-24: qty 1

## 2018-05-24 MED ORDER — ONDANSETRON HCL 4 MG/2ML IJ SOLN
INTRAMUSCULAR | Status: AC
  Filled 2018-05-24: qty 2

## 2018-05-24 MED ORDER — LIDOCAINE 2% (20 MG/ML) 5 ML SYRINGE
INTRAMUSCULAR | Status: AC
  Filled 2018-05-24: qty 5

## 2018-05-31 ENCOUNTER — Inpatient Hospital Stay: Payer: Medicaid Other | Attending: Gynecology | Admitting: Gynecology

## 2018-06-01 ENCOUNTER — Inpatient Hospital Stay (INDEPENDENT_AMBULATORY_CARE_PROVIDER_SITE_OTHER): Payer: BLUE CROSS/BLUE SHIELD | Admitting: Orthopaedic Surgery

## 2018-06-06 ENCOUNTER — Ambulatory Visit: Payer: BLUE CROSS/BLUE SHIELD | Admitting: Emergency Medicine

## 2018-11-30 DIAGNOSIS — K047 Periapical abscess without sinus: Secondary | ICD-10-CM | POA: Diagnosis not present

## 2019-04-27 DIAGNOSIS — Z23 Encounter for immunization: Secondary | ICD-10-CM | POA: Diagnosis not present

## 2019-05-06 ENCOUNTER — Emergency Department (HOSPITAL_COMMUNITY)
Admission: EM | Admit: 2019-05-06 | Discharge: 2019-05-06 | Disposition: A | Discharge status: Home / Self Care | Payer: Medicaid Other | Attending: Emergency Medicine | Admitting: Emergency Medicine

## 2019-05-06 ENCOUNTER — Other Ambulatory Visit: Payer: Self-pay

## 2019-05-06 ENCOUNTER — Encounter (HOSPITAL_COMMUNITY): Payer: Self-pay | Admitting: *Deleted

## 2019-05-06 DIAGNOSIS — Z87891 Personal history of nicotine dependence: Secondary | ICD-10-CM | POA: Diagnosis not present

## 2019-05-06 DIAGNOSIS — R109 Unspecified abdominal pain: Secondary | ICD-10-CM | POA: Diagnosis not present

## 2019-05-06 DIAGNOSIS — Z8541 Personal history of malignant neoplasm of cervix uteri: Secondary | ICD-10-CM | POA: Diagnosis not present

## 2019-05-06 DIAGNOSIS — Z79899 Other long term (current) drug therapy: Secondary | ICD-10-CM | POA: Insufficient documentation

## 2019-05-06 DIAGNOSIS — R1031 Right lower quadrant pain: Secondary | ICD-10-CM | POA: Diagnosis not present

## 2019-05-06 LAB — URINALYSIS, ROUTINE W REFLEX MICROSCOPIC
Bilirubin Urine: NEGATIVE
Glucose, UA: NEGATIVE mg/dL
Ketones, ur: NEGATIVE mg/dL
Nitrite: POSITIVE — AB
Protein, ur: 30 mg/dL — AB
Specific Gravity, Urine: 1.013 (ref 1.005–1.030)
Trans Epithel, UA: 14
WBC, UA: >50 WBC/hpf — ABNORMAL HIGH (ref 0–5)
pH: 6 (ref 5.0–8.0)

## 2019-05-06 MED ORDER — CEPHALEXIN 500 MG PO CAPS
1000.0000 mg | ORAL_CAPSULE | Freq: Once | ORAL | Status: AC
  Administered 2019-05-06: 10:00:00 1000 mg via ORAL
  Filled 2019-05-06: qty 2

## 2019-05-06 MED ORDER — CEPHALEXIN 500 MG PO CAPS: 500.0000 mg | ORAL_CAPSULE | Freq: Four times a day (QID) | ORAL | 0 refills | Status: DC

## 2019-05-06 MED ORDER — KETOROLAC TROMETHAMINE 60 MG/2ML IM SOLN
60.0000 mg | Freq: Once | INTRAMUSCULAR | Status: AC
  Administered 2019-05-06: 60 mg via INTRAMUSCULAR
  Filled 2019-05-06: qty 2

## 2019-05-06 MED ORDER — KETOROLAC TROMETHAMINE 10 MG PO TABS: 10.0000 mg | ORAL_TABLET | Freq: Four times a day (QID) | ORAL | 0 refills | Status: DC | PRN

## 2019-05-06 NOTE — ED Provider Notes (Addendum)
[facility] EMERGENCY DEPARTMENT Provider Note   CSN: [id number] Arrival date & time: 05/06/19  [id number]     History   Chief Complaint Chief Complaint  Patient presents with  . Flank Pain    HPI April Ayala is a 43 y.o. female.     Right suprapubic pain radiating to the right flank for the past 24 hours with decreased urinary output.  No frank dysuria, hematuria, fever, chills.  Previous history of kidney stone.  No vaginal discharge.  Severity is moderate.  Nothing makes symptoms better or worse.     Past Medical History:  Diagnosis Date  . 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 History of kidney stones    passed    Patient Active Problem List   Diagnosis Date Noted  . S/P lumbar fusion 04/20/2018  . Chronic tension-type headache, not intractable 10/07/2017  . Chronic left shoulder pain 10/07/2017  . Cervical cancer ([place]) 03/30/2012    Past Surgical History:  Procedure Laterality Date  . BREAST BIOPSY Right   . CERVICAL CONIZATION W/BX  03/24/2012   Procedure: CONIZATION CERVIX WITH BIOPSY;  Surgeon: Woodroe Mode, MD;  Location: [facility];  Service: Gynecology;  Laterality: N/A;  . DIAGNOSTIC LAPAROSCOPY  2007   ectopic  . ECTOPIC PREGNANCY SURGERY    . ESSURE TUBAL LIGATION  2010  . INSERTION OF SUPRAPUBIC CATHETER  05/03/2012   Procedure: INSERTION OF SUPRAPUBIC CATHETER;  Surgeon: Alvino Chapel, MD;  Location: [facility];  Service: Gynecology;;  . LEEP    . LUMBAR LAMINECTOMY/DECOMPRESSION MICRODISCECTOMY N/A 04/20/2018   Procedure: LUMBAR FIVE TO SACRAL ONE DECOMPRESSION AND FUSION, POSSIBLE LUMBAR FOUR TO SACRAL ONE; SCREW AND CAGE INSTRUMENTATION;  Surgeon: Melina Schools, MD;  Location: [facility];  Service: Orthopedics;  Laterality: N/A;  5 hrs  . LYMPHADENECTOMY  05/03/2012   Procedure: LYMPHADENECTOMY;  Surgeon: Alvino Chapel, MD;  Location: [facility];  Service: Gynecology;  Laterality: Bilateral;  pelvic  . RADICAL  HYSTERECTOMY  05/03/2012   Procedure: RADICAL HYSTERECTOMY;  Surgeon: Alvino Chapel, MD;  Location: [facility];  Service: Gynecology;;  transposition of the ovaries  . TUBAL LIGATION       OB History    Gravida  4   Para  4   Term  4   Preterm      AB      Living  4     SAB      TAB      Ectopic      Multiple      Live Births               Home Medications    Prior to Admission medications   Medication Sig Start Date End Date Taking? Authorizing Provider  buPROPion (WELLBUTRIN XL) 300 MG 24 hr tablet Take 1 tablet (300 mg total) by mouth daily. Patient not taking: Reported on 04/07/2018 03/16/18   Caren Macadam, MD  cephALEXin (KEFLEX) 500 MG capsule Take 1 capsule (500 mg total) by mouth 4 (four) times daily. 05/06/19   Nat Christen, MD  cyclobenzaprine (FLEXERIL) 10 MG tablet Take 10 mg by mouth 3 (three) times daily as needed for muscle spasms.    [provider]  gabapentin (NEURONTIN) 300 MG capsule Take 1 capsule (300 mg total) by mouth 2 (two) times daily. Patient taking differently: Take 300 mg by mouth 3 (three) times daily.  04/21/18 04/21/19  Mayo, Darla Lesches, PA-C  ketorolac (TORADOL) 10 MG tablet Take 1 tablet (10 mg total) by mouth every 6 (six) hours as needed. 05/06/19   Nat Christen, MD  methocarbamol (ROBAXIN) 500 MG tablet Take 1 tablet (500 mg total) by mouth 4 (four) times daily. Patient not taking: Reported on 05/19/2018 04/21/18   Mayo, Darla Lesches, PA-C  nabumetone (RELAFEN) 500 MG tablet Take 500 mg by mouth 2 (two) times daily. 04/25/18   [provider]  ondansetron (ZOFRAN) 4 MG tablet Take 4 mg by mouth 2 (two) times daily as needed for nausea/vomiting. 05/03/18   [provider]  oxyCODONE-acetaminophen (PERCOCET) 10-325 MG tablet Take 1 tablet by mouth 3 (three) times daily as needed for pain. 05/03/18   [provider]    Family History Family History  Problem Relation Age of Onset   . Diabetes Mother   . Hypertension Father   . Diabetes Sister   . Hypothyroidism Sister     Social History Social History   Tobacco Use  . Smoking status: Former Smoker    Packs/day: 1.00    Years: 19.00    Pack years: 19.00    Types: Cigarettes    Quit date: 05/19/2017    Years since quitting: 1.9  . Smokeless tobacco: Never Used  . Tobacco comment: smoking cessation information given  Substance Use Topics  . Alcohol use: Not Currently    Comment: occasionally  . Drug use: No     Allergies   Patient has no known allergies.   Review of Systems Review of Systems  All other systems reviewed and are negative.    Physical Exam Updated Vital Signs BP 119/71 (BP Location: Left Arm)   Pulse (!) 102   Temp 98.2 F (36.8 C) (Oral)   Resp 16   Ht 5\' 3"  (1.6 m)   LMP 04/11/2012   SpO2 98%   BMI 40.92 kg/m   Physical Exam Vitals signs and nursing note reviewed.  Constitutional:      Appearance: She is well-developed.  HENT:     Head: Normocephalic and atraumatic.  Eyes:     Conjunctiva/sclera: Conjunctivae normal.  Neck:     Musculoskeletal: Neck supple.  Cardiovascular:     Rate and Rhythm: Normal rate and regular rhythm.  Pulmonary:     Effort: Pulmonary effort is normal.     Breath sounds: Normal breath sounds.  Abdominal:     General: Bowel sounds are normal.     Palpations: Abdomen is soft.     Comments: Minimal right suprapubic tenderness.  Genitourinary:    Comments: Minimal right flank tenderness. Musculoskeletal: Normal range of motion.  Skin:    General: Skin is warm and dry.  Neurological:     General: No focal deficit present.     Mental Status: She is alert and oriented to person, place, and time.  Psychiatric:        Behavior: Behavior normal.      ED Treatments / Results  Labs (all labs ordered are listed, but only abnormal results are displayed) Labs Reviewed  URINALYSIS, ROUTINE W REFLEX MICROSCOPIC - Abnormal; Notable for  the following components:      Result Value   Color, Urine AMBER (*)    APPearance HAZY (*)    Hgb urine dipstick MODERATE (*)    Protein, ur 30 (*)    Nitrite POSITIVE (*)    Leukocytes,Ua SMALL (*)    WBC, UA >50 (*)    Bacteria, UA RARE (*)  All other components within normal limits  URINE CULTURE    EKG None  Radiology No results found.  Procedures Procedures (including critical care time)  Medications Ordered in ED Medications  cephALEXin (KEFLEX) capsule 1,000 mg (has no administration in time range)  ketorolac (TORADOL) injection 60 mg (60 mg Intramuscular Given 05/06/19 0810)     Initial Impression / Assessment and Plan / ED Course  I have reviewed the triage vital signs and the nursing notes.  Pertinent labs & imaging results that were available during my care of the patient were reviewed by me and considered in my medical decision making (see chart for details).    Suspect a urinary tract infection versus kidney stone.  2045: Urinalysis reveals evidence of infection.  Will Rx Keflex 500 mg, Toradol orally, urine culture.  Could still be a stone.  Patient will return if symptoms worsen.     Final Clinical Impressions(s) / ED Diagnoses   Final diagnoses:  Right flank pain    ED Discharge Orders         Ordered    cephALEXin (KEFLEX) 500 MG capsule  4 times daily     05/06/19 0854    ketorolac (TORADOL) 10 MG tablet  Every 6 hours PRN     05/06/19 0854           Nat Christen, MD 05/06/19 0800    Nat Christen, MD 05/06/19 [phone]

## 2019-05-06 NOTE — ED Triage Notes (Signed)
Pt c/o right flank pain that radiates around to RLQ of abdomen, urinary frequency and feeling as if she cannot empty her bladder when urinating. Denies n/v, fever.

## 2019-05-06 NOTE — Discharge Instructions (Signed)
Increase fluids.  Prescription for antibiotic and anti-inflammatory medicine.  Return if pain will not go away.

## 2019-05-09 LAB — URINE CULTURE: Culture: >=100000 — AB

## 2019-05-10 ENCOUNTER — Telehealth: Payer: Self-pay | Admitting: Emergency Medicine

## 2019-05-10 NOTE — Telephone Encounter (Signed)
Post ED Visit - Positive Culture Follow-up  Culture report reviewed by antimicrobial stewardship pharmacist: [place] Team []  Elenor Quinones, Pharm.D. []  Heide Guile, Pharm.D., BCPS AQ-ID []  Parks Neptune, Pharm.D., BCPS []  Alycia Rossetti, Pharm.D., BCPS []  [place], [place].D., BCPS, AAHIVP []  Legrand Como, Pharm.D., BCPS, AAHIVP []  Salome Arnt, PharmD, BCPS []  Johnnette Gourd, PharmD, BCPS []  Hughes Better, PharmD, BCPS []  Leeroy Cha, PharmD []  Laqueta Linden, PharmD, BCPS []  Albertina Parr, PharmD  [place] Team []  Leodis Sias, PharmD []  Lindell Spar, PharmD []  Royetta Asal, PharmD []  Graylin Shiver, Rph []  Rema Fendt) Glennon Mac, PharmD []  Arlyn Dunning, PharmD []  Netta Cedars, PharmD []  Dia Sitter, PharmD []  Leone Haven, PharmD []  Gretta Arab, PharmD []  Theodis Shove, PharmD []  Peggyann Juba, PharmD []  Reuel Boom, PharmD   Positive urine culture Treated with cephalexin, organism sensitive to the same and no further patient follow-up is required at this time.  Hazle Nordmann 05/10/2019, 5:03 PM

## 2019-05-21 ENCOUNTER — Emergency Department (HOSPITAL_COMMUNITY)
Admission: EM | Admit: 2019-05-21 | Discharge: 2019-05-21 | Disposition: A | Discharge status: Home / Self Care | Payer: Medicaid Other | Attending: Emergency Medicine | Admitting: Emergency Medicine

## 2019-05-21 ENCOUNTER — Other Ambulatory Visit: Payer: Self-pay

## 2019-05-21 ENCOUNTER — Emergency Department (HOSPITAL_COMMUNITY): Payer: Medicaid Other

## 2019-05-21 ENCOUNTER — Encounter (HOSPITAL_COMMUNITY): Payer: Self-pay | Admitting: Emergency Medicine

## 2019-05-21 DIAGNOSIS — Z8541 Personal history of malignant neoplasm of cervix uteri: Secondary | ICD-10-CM | POA: Diagnosis not present

## 2019-05-21 DIAGNOSIS — R1031 Right lower quadrant pain: Secondary | ICD-10-CM | POA: Diagnosis present

## 2019-05-21 DIAGNOSIS — N390 Urinary tract infection, site not specified: Secondary | ICD-10-CM | POA: Insufficient documentation

## 2019-05-21 DIAGNOSIS — Z79899 Other long term (current) drug therapy: Secondary | ICD-10-CM | POA: Diagnosis not present

## 2019-05-21 DIAGNOSIS — R109 Unspecified abdominal pain: Secondary | ICD-10-CM | POA: Diagnosis not present

## 2019-05-21 DIAGNOSIS — N201 Calculus of ureter: Secondary | ICD-10-CM

## 2019-05-21 DIAGNOSIS — R319 Hematuria, unspecified: Secondary | ICD-10-CM | POA: Diagnosis not present

## 2019-05-21 DIAGNOSIS — Z87891 Personal history of nicotine dependence: Secondary | ICD-10-CM | POA: Diagnosis not present

## 2019-05-21 LAB — CBC WITH DIFFERENTIAL/PLATELET
Abs Immature Granulocytes: 0.06 10*3/uL (ref 0.00–0.07)
Basophils Absolute: 0.1 10*3/uL (ref 0.0–0.1)
Basophils Relative: 1 %
Eosinophils Absolute: 0.2 10*3/uL (ref 0.0–0.5)
Eosinophils Relative: 2 %
HCT: 44.2 % (ref 36.0–46.0)
Hemoglobin: 14.7 g/dL (ref 12.0–15.0)
Immature Granulocytes: 1 %
Lymphocytes Relative: 19 %
Lymphs Abs: 2.4 10*3/uL (ref 0.7–4.0)
MCH: 30.1 pg (ref 26.0–34.0)
MCHC: 33.3 g/dL (ref 30.0–36.0)
MCV: 90.4 fL (ref 80.0–100.0)
Monocytes Absolute: 1 10*3/uL (ref 0.1–1.0)
Monocytes Relative: 8 %
Neutro Abs: 8.9 10*3/uL — ABNORMAL HIGH (ref 1.7–7.7)
Neutrophils Relative %: 69 %
Platelets: 379 10*3/uL (ref 150–400)
RBC: 4.89 MIL/uL (ref 3.87–5.11)
RDW: 13.6 % (ref 11.5–15.5)
WBC: 12.6 10*3/uL — ABNORMAL HIGH (ref 4.0–10.5)
nRBC: 0 % (ref 0.0–0.2)

## 2019-05-21 LAB — COMPREHENSIVE METABOLIC PANEL
ALT: 40 U/L (ref 0–44)
AST: 47 U/L — ABNORMAL HIGH (ref 15–41)
Albumin: 3.8 g/dL (ref 3.5–5.0)
Alkaline Phosphatase: 71 U/L (ref 38–126)
Anion gap: 11 (ref 5–15)
BUN: 15 mg/dL (ref 6–20)
CO2: 24 mmol/L (ref 22–32)
Calcium: 8.6 mg/dL — ABNORMAL LOW (ref 8.9–10.3)
Chloride: 102 mmol/L (ref 98–111)
Creatinine, Ser: 0.78 mg/dL (ref 0.44–1.00)
GFR calc Af Amer: >60 mL/min (ref >60)
GFR calc non Af Amer: >60 mL/min (ref >60)
Glucose, Bld: 105 mg/dL — ABNORMAL HIGH (ref 70–99)
Potassium: 3.6 mmol/L (ref 3.5–5.1)
Sodium: 137 mmol/L (ref 135–145)
Total Bilirubin: 0.8 mg/dL (ref 0.3–1.2)
Total Protein: 6.9 g/dL (ref 6.5–8.1)

## 2019-05-21 LAB — URINALYSIS, ROUTINE W REFLEX MICROSCOPIC
Bacteria, UA: NONE SEEN
Bilirubin Urine: NEGATIVE
Glucose, UA: NEGATIVE mg/dL
Ketones, ur: NEGATIVE mg/dL
Nitrite: NEGATIVE
Protein, ur: 100 mg/dL — AB
RBC / HPF: >50 RBC/hpf — ABNORMAL HIGH (ref 0–5)
Specific Gravity, Urine: 1.012 (ref 1.005–1.030)
pH: 5 (ref 5.0–8.0)

## 2019-05-21 LAB — POC URINE PREG, ED: Preg Test, Ur: NEGATIVE

## 2019-05-21 LAB — PREGNANCY, URINE: Preg Test, Ur: NEGATIVE

## 2019-05-21 MED ORDER — KETOROLAC TROMETHAMINE 30 MG/ML IJ SOLN
30.0000 mg | Freq: Once | INTRAMUSCULAR | Status: AC
  Administered 2019-05-21: 30 mg via INTRAMUSCULAR
  Filled 2019-05-21: qty 1

## 2019-05-21 MED ORDER — CEPHALEXIN 500 MG PO CAPS
500.0000 mg | ORAL_CAPSULE | Freq: Once | ORAL | Status: AC
  Administered 2019-05-21: 500 mg via ORAL
  Filled 2019-05-21: qty 1

## 2019-05-21 MED ORDER — ONDANSETRON HCL 4 MG PO TABS: 4.0000 mg | ORAL_TABLET | Freq: Three times a day (TID) | ORAL | 0 refills | Status: DC | PRN

## 2019-05-21 MED ORDER — CEPHALEXIN 500 MG PO CAPS: 500.0000 mg | ORAL_CAPSULE | Freq: Three times a day (TID) | ORAL | 0 refills | Status: DC

## 2019-05-21 MED ORDER — TAMSULOSIN HCL 0.4 MG PO CAPS: ORAL_CAPSULE | ORAL | 0 refills | Status: DC

## 2019-05-21 NOTE — Discharge Instructions (Addendum)
Drink plenty of fluids. Take the medications as prescribed.  Take your pain medication, you just got #90 oxycodone 10/325 on November 9.  You can let your pain management doctor know that you had a kidney stone and you may have to take a couple of extra.  You can also take ibuprofen 600 mg 4 times a day which is very good for kidney stone pain.  Return to the ED if you get fever or have uncontrolled vomiting or pain, otherwise call [facility] to get a follow-up appointment.  Tell them you have a "4 mm stone in the mid right ureter".  If you call in the morning they should see you that afternoon, you may have to go to the [place] office if they are not in [place] that day.

## 2019-05-21 NOTE — ED Provider Notes (Signed)
[facility] EMERGENCY DEPARTMENT Provider Note   CSN: [id number] Arrival date & time: 05/21/19  0230   Time seen 3:15 AM  History   Chief Complaint Chief Complaint  Patient presents with   Abdominal Pain    HPI MARYMARGARET ECKEL is a 43 y.o. female.     HPI patient states on November 14 when she went to the bathroom she was having burning on urination, frequency, urgency, but denies hematuria, or fever.  She states she has been having pain in her right lower abdomen that radiates into her right flank area.  She has had nausea without vomiting.  She states urinating and walking makes her abdominal pain worse, nothing makes it feel better.  She was seen in the ED on October 31 with similar symptoms and was found to have a UTI.  She states she took cephalexin 4 times a day for 7 days.  She states her symptoms did get better.  When I review her chart she had 100,000 colonies of E. coli that was sensitive to most antibiotics including cephalexin.  Patient is on Percocet for chronic back pain for over a year.  PCP Patient, No Pcp Per Pain management Dr. Herma Mering.  Past Medical History:  Diagnosis Date   Cervical cancer ([place]) 04/2011   Stage 1B squamous cell   History of kidney stones    passed    Patient Active Problem List   Diagnosis Date Noted   S/P lumbar fusion 04/20/2018   Chronic tension-type headache, not intractable 10/07/2017   Chronic left shoulder pain 10/07/2017   Cervical cancer ([place]) 03/30/2012    Past Surgical History:  Procedure Laterality Date   BREAST BIOPSY Right    CERVICAL CONIZATION W/BX  03/24/2012   Procedure: CONIZATION CERVIX WITH BIOPSY;  Surgeon: Woodroe Mode, MD;  Location: [facility];  Service: Gynecology;  Laterality: N/A;   DIAGNOSTIC LAPAROSCOPY  2007   ectopic   ECTOPIC PREGNANCY SURGERY     ESSURE TUBAL LIGATION  2010   INSERTION OF SUPRAPUBIC CATHETER  05/03/2012   Procedure: INSERTION OF SUPRAPUBIC CATHETER;  Surgeon:  Alvino Chapel, MD;  Location: [facility];  Service: Gynecology;;   LEEP     LUMBAR LAMINECTOMY/DECOMPRESSION MICRODISCECTOMY N/A 04/20/2018   Procedure: LUMBAR FIVE TO SACRAL ONE DECOMPRESSION AND FUSION, POSSIBLE LUMBAR FOUR TO SACRAL ONE; SCREW AND CAGE INSTRUMENTATION;  Surgeon: Melina Schools, MD;  Location: [facility];  Service: Orthopedics;  Laterality: N/A;  5 hrs   LYMPHADENECTOMY  05/03/2012   Procedure: LYMPHADENECTOMY;  Surgeon: Alvino Chapel, MD;  Location: [facility];  Service: Gynecology;  Laterality: Bilateral;  pelvic   RADICAL HYSTERECTOMY  05/03/2012   Procedure: RADICAL HYSTERECTOMY;  Surgeon: Alvino Chapel, MD;  Location: [facility];  Service: Gynecology;;  transposition of the ovaries   TUBAL LIGATION       OB History    Gravida  4   Para  4   Term  4   Preterm      AB      Living  4     SAB      TAB      Ectopic      Multiple      Live Births               Home Medications    Prior to Admission medications   Medication Sig Start Date End Date Taking? Authorizing Provider  buPROPion (WELLBUTRIN XL) 300 MG 24 hr tablet Take  1 tablet (300 mg total) by mouth daily. Patient not taking: Reported on 04/07/2018 03/16/18   Caren Macadam, MD  cephALEXin (KEFLEX) 500 MG capsule Take 1 capsule (500 mg total) by mouth 3 (three) times daily. 05/21/19   Rolland Porter, MD  cyclobenzaprine (FLEXERIL) 10 MG tablet Take 10 mg by mouth 3 (three) times daily as needed for muscle spasms.    [provider]  gabapentin (NEURONTIN) 300 MG capsule Take 1 capsule (300 mg total) by mouth 2 (two) times daily. Patient taking differently: Take 300 mg by mouth 3 (three) times daily.  04/21/18 04/21/19  Mayo, Darla Lesches, PA-C  ketorolac (TORADOL) 10 MG tablet Take 1 tablet (10 mg total) by mouth every 6 (six) hours as needed. 05/06/19   Nat Christen, MD  methocarbamol (ROBAXIN) 500 MG tablet Take 1 tablet (500 mg total) by mouth 4 (four) times  daily. Patient not taking: Reported on 05/19/2018 04/21/18   Mayo, Darla Lesches, PA-C  nabumetone (RELAFEN) 500 MG tablet Take 500 mg by mouth 2 (two) times daily. 04/25/18   [provider]  ondansetron (ZOFRAN) 4 MG tablet Take 1 tablet (4 mg total) by mouth every 8 (eight) hours as needed for nausea or vomiting. 05/21/19   Rolland Porter, MD  oxyCODONE-acetaminophen (PERCOCET) 10-325 MG tablet Take 1 tablet by mouth 3 (three) times daily as needed for pain. 05/03/18   [provider]  tamsulosin (FLOMAX) 0.4 MG CAPS capsule Take 1 po QD until you pass the stone. 05/21/19   Rolland Porter, MD    Family History Family History  Problem Relation Age of Onset   Diabetes Mother    Hypertension Father    Diabetes Sister    Hypothyroidism Sister     Social History Social History   Tobacco Use   Smoking status: Former Smoker    Packs/day: 1.00    Years: 19.00    Pack years: 19.00    Types: Cigarettes    Quit date: 05/19/2017    Years since quitting: 2.0   Smokeless tobacco: Never Used   Tobacco comment: smoking cessation information given  Substance Use Topics   Alcohol use: Not Currently    Frequency: Never    Comment: occasionally   Drug use: No     Allergies   Patient has no known allergies.   Review of Systems Review of Systems  All other systems reviewed and are negative.    Physical Exam Updated Vital Signs BP (!) 124/95    Pulse 99    Temp 98.8 F (37.1 C) (Oral)    Resp 19    Ht 5\' 3"  (1.6 m)    Wt 92.1 kg    LMP 04/11/2012    SpO2 95%    BMI 35.96 kg/m   Physical Exam Vitals signs and nursing note reviewed.  Constitutional:      General: She is not in acute distress.    Appearance: Normal appearance. She is well-developed. She is not ill-appearing or toxic-appearing.     Comments: Dramatic  HENT:     Head: Normocephalic and atraumatic.     Right Ear: External ear normal.     Left Ear: External ear normal.     Nose: Nose normal.  No mucosal edema or rhinorrhea.     Mouth/Throat:     Dentition: No dental abscesses.     Pharynx: No uvula swelling.  Eyes:     Extraocular Movements: Extraocular movements intact.     Conjunctiva/sclera: Conjunctivae normal.  Pupils: Pupils are equal, round, and reactive to light.  Neck:     Musculoskeletal: Full passive range of motion without pain, normal range of motion and neck supple.  Cardiovascular:     Rate and Rhythm: Normal rate and regular rhythm.     Heart sounds: Normal heart sounds. No murmur. No friction rub. No gallop.   Pulmonary:     Effort: Pulmonary effort is normal. No respiratory distress.     Breath sounds: Normal breath sounds. No wheezing, rhonchi or rales.  Chest:     Chest wall: No tenderness or crepitus.  Abdominal:     General: Bowel sounds are normal. There is no distension.     Palpations: Abdomen is soft.     Tenderness: There is abdominal tenderness. There is right CVA tenderness. There is no left CVA tenderness, guarding or rebound.    Musculoskeletal: Normal range of motion.        General: No tenderness.     Comments: Moves all extremities well.   Skin:    General: Skin is warm and dry.     Coloration: Skin is not pale.     Findings: No erythema or rash.  Neurological:     General: No focal deficit present.     Mental Status: She is alert and oriented to person, place, and time.     Cranial Nerves: No cranial nerve deficit.  Psychiatric:        Mood and Affect: Mood normal. Mood is not anxious.        Speech: Speech normal.        Behavior: Behavior normal.        Thought Content: Thought content normal.      ED Treatments / Results  Labs (all labs ordered are listed, but only abnormal results are displayed) Results for orders placed or performed during the hospital encounter of 05/21/19  Urinalysis, Routine w reflex microscopic  Result Value Ref Range   Color, Urine YELLOW YELLOW   APPearance CLOUDY (A) CLEAR   Specific  Gravity, Urine 1.012 1.005 - 1.030   pH 5.0 5.0 - 8.0   Glucose, UA NEGATIVE NEGATIVE mg/dL   Hgb urine dipstick MODERATE (A) NEGATIVE   Bilirubin Urine NEGATIVE NEGATIVE   Ketones, ur NEGATIVE NEGATIVE mg/dL   Protein, ur 100 (A) NEGATIVE mg/dL   Nitrite NEGATIVE NEGATIVE   Leukocytes,Ua LARGE (A) NEGATIVE   RBC / HPF >50 (H) 0 - 5 RBC/hpf   WBC, UA 21-50 0 - 5 WBC/hpf   Bacteria, UA NONE SEEN NONE SEEN   Squamous Epithelial / LPF 0-5 0 - 5   WBC Clumps PRESENT    Mucus PRESENT    Non Squamous Epithelial 6-10 (A) NONE SEEN  Pregnancy, urine  Result Value Ref Range   Preg Test, Ur NEGATIVE NEGATIVE  Comprehensive metabolic panel  Result Value Ref Range   Sodium 137 135 - 145 mmol/L   Potassium 3.6 3.5 - 5.1 mmol/L   Chloride 102 98 - 111 mmol/L   CO2 24 22 - 32 mmol/L   Glucose, Bld 105 (H) 70 - 99 mg/dL   BUN 15 6 - 20 mg/dL   Creatinine, Ser 0.78 0.44 - 1.00 mg/dL   Calcium 8.6 (L) 8.9 - 10.3 mg/dL   Total Protein 6.9 6.5 - 8.1 g/dL   Albumin 3.8 3.5 - 5.0 g/dL   AST 47 (H) 15 - 41 U/L   ALT 40 0 - 44 U/L   Alkaline Phosphatase  71 38 - 126 U/L   Total Bilirubin 0.8 0.3 - 1.2 mg/dL   GFR calc non Af Amer >60 >60 mL/min   GFR calc Af Amer >60 >60 mL/min   Anion gap 11 5 - 15  CBC with Differential  Result Value Ref Range   WBC 12.6 (H) 4.0 - 10.5 K/uL   RBC 4.89 3.87 - 5.11 MIL/uL   Hemoglobin 14.7 12.0 - 15.0 g/dL   HCT 44.2 36.0 - 46.0 %   MCV 90.4 80.0 - 100.0 fL   MCH 30.1 26.0 - 34.0 pg   MCHC 33.3 30.0 - 36.0 g/dL   RDW 13.6 11.5 - 15.5 %   Platelets 379 150 - 400 K/uL   nRBC 0.0 0.0 - 0.2 %   Neutrophils Relative % 69 %   Neutro Abs 8.9 (H) 1.7 - 7.7 K/uL   Lymphocytes Relative 19 %   Lymphs Abs 2.4 0.7 - 4.0 K/uL   Monocytes Relative 8 %   Monocytes Absolute 1.0 0.1 - 1.0 K/uL   Eosinophils Relative 2 %   Eosinophils Absolute 0.2 0.0 - 0.5 K/uL   Basophils Relative 1 %   Basophils Absolute 0.1 0.0 - 0.1 K/uL   Immature Granulocytes 1 %   Abs  Immature Granulocytes 0.06 0.00 - 0.07 K/uL  POC urine preg, ED (not at [facility])  Result Value Ref Range   Preg Test, Ur NEGATIVE NEGATIVE   Laboratory interpretation all normal except leukocytosis, UTI    EKG None  Radiology Ct Renal Stone Study  Result Date: 05/21/2019 CLINICAL DATA:  Right flank pain EXAM: CT ABDOMEN AND PELVIS WITHOUT CONTRAST TECHNIQUE: Multidetector CT imaging of the abdomen and pelvis was performed following the standard protocol without IV contrast. COMPARISON:  CT abdomen pelvis 01/21/2018 FINDINGS: LOWER CHEST: No basilar pleural or apical pericardial effusion. HEPATOBILIARY: Normal hepatic contours. There is no intra- or extrahepatic biliary dilatation. There is cholelithiasis without acute inflammation. PANCREAS: Normal pancreatic contours without pancreatic ductal dilatation or peripancreatic fluid collection. SPLEEN: Normal. ADRENALS/URINARY TRACT: --Adrenal glands: Normal. --Right kidney/ureter: There is a stone within the mid ureter measuring 4 mm, causing mild hydroureteronephrosis and mild perinephric stranding. --Left kidney/ureter: No hydronephrosis, nephroureterolithiasis or solid renal mass. --Urinary bladder: Normal for degree of distention STOMACH/BOWEL: --Stomach/Duodenum: There is no hiatal hernia. The duodenal course and caliber are normal. --Small bowel: No dilatation or inflammation. --Colon: No focal abnormality. --Appendix: Normal. VASCULAR/LYMPHATIC: Normal course and caliber of the major abdominal vessels. No abdominal or pelvic lymphadenopathy. REPRODUCTIVE: Status post hysterectomy. No adnexal mass. MUSCULOSKELETAL. L5-S1 PLIF with grade 2 anterolisthesis. OTHER: None. IMPRESSION: 1. Right-sided obstructive uropathy with 4 mm stone in the mid right ureter causing mild hydroureteronephrosis and perinephric stranding. 2. Cholelithiasis without acute inflammation. 3. L5-S1 PLIF with grade 2 anterolisthesis. Electronically Signed   By: Ulyses Jarred M.D.    On: 05/21/2019 05:01    Procedures Procedures (including critical care time)   Urine Culture 05/06/2019    Component 2wk ago  Specimen Description URINE, CLEAN CATCH  Performed at [facility], [address], [place], [place] [address]   Special Requests NONE  Performed at [facility], [address], [place], [place] [address]   Culture >=100,000 COLONIES/mL ESCHERICHIA COLIAbnormal    Report Status 05/09/2019 FINAL   Organism ID, Bacteria ESCHERICHIA COLIAbnormal    Resulting Agency [facility]  Susceptibility   Escherichia coli    MIC    AMPICILLIN <=2 SENSITIVE  Sensitive    AMPICILLIN/SULBACTAM <=2  SENSITIVE  Sensitive    CEFAZOLIN <=4 SENSITIVE  Sensitive    CEFTRIAXONE <=1 SENSITIVE  Sensitive    CIPROFLOXACIN <=0.25 SENS... Sensitive    Extended ESBL NEGATIVE  Sensitive    GENTAMICIN <=1 SENSITIVE  Sensitive    IMIPENEM <=0.25 SENS... Sensitive    NITROFURANTOIN <=16 SENSIT... Sensitive    PIP/TAZO <=4 SENSITIVE  Sensitive    TRIMETH/SULFA <=20 SENSIT... Sensitive         Susceptibility Comments  Escherichia coli  >=100,000 COLONIES/mL ESCHERICHIA COLI      Specimen Collected: 05/06/19 08:02 Last Resulted: 05/09/19 08:02           Medications Ordered in ED Medications  cephALEXin (KEFLEX) capsule 500 mg (has no administration in time range)  ketorolac (TORADOL) 30 MG/ML injection 30 mg (30 mg Intramuscular Given 05/21/19 0347)     Initial Impression / Assessment and Plan / ED Course  I have reviewed the triage vital signs and the nursing notes.  Pertinent labs & imaging results that were available during my care of the patient were reviewed by me and considered in my medical decision making (see chart for details).       I reviewed her recent urine culture as discussed above.  She had a CT in July 2019 when she presented with right-sided abdominal pain and it was negative for renal stones or appendicitis.  Patient's BUN and creatinine were 9 and  0.82 in July 2019.  She was given Toradol IM.  CT renal was done.  Recheck at 6:00 AM patient's pain is much better.  We discussed her CT result.  She appears to have a urinary tract infection again, the ureteral stone is probably a nidus for the infection.  She was started back on the cephalexin.  Her urine culture showed her E. coli was sensitive to all antibiotics.  Review of the [facility] shows patient gets #90 oxycodone 10/325 monthly, last filled November 9.  Final Clinical Impressions(s) / ED Diagnoses   Final diagnoses:  Right ureteral stone  Urinary tract infection with hematuria, site unspecified    ED Discharge Orders         Ordered    tamsulosin (FLOMAX) 0.4 MG CAPS capsule     05/21/19 0626    ondansetron (ZOFRAN) 4 MG tablet  Every 8 hours PRN     05/21/19 0626    cephALEXin (KEFLEX) 500 MG capsule  3 times daily     05/21/19 [id number]          Plan discharge  Rolland Porter, MD, Barbette Or, MD 05/21/19 [phone]

## 2019-05-21 NOTE — ED Triage Notes (Addendum)
Pt presents to ED w/complaints of RLQ abd pain that radiates to her back. Recent UTI. Pt states symptoms started yesterday. Pt states she has been nauseous but denies episodes of vomiting

## 2019-05-22 LAB — URINE CULTURE: Culture: <10000 — AB

## 2019-09-14 IMAGING — CT CT ABD-PELV W/ CM
2 of 4 series · 16 of 46 positions shown, 18 images · IV contrast (Isovue)
Comparison: CT of the abdomen pelvis dated 09/07/2014 and lumbar
spine CT dated 12/13/2017

CLINICAL DATA: 42-year-old female with right lower quadrant
abdominal pain. Concern for kidney stone versus appendicitis.

EXAM:
CT ABDOMEN AND PELVIS WITH CONTRAST
TECHNIQUE: Multidetector CT imaging of the abdomen and pelvis was performed
using the standard protocol following bolus administration of
intravenous contrast.
CONTRAST:  100mL LWRP41-QDD IOPAMIDOL (LWRP41-QDD) INJECTION 61%

[Series 2: axial st · axial · 0.74mm/px · z∈[-158,+277]mm · 13 of 97 slices shown, 15 images]
[im 5/97  soft-tissue]
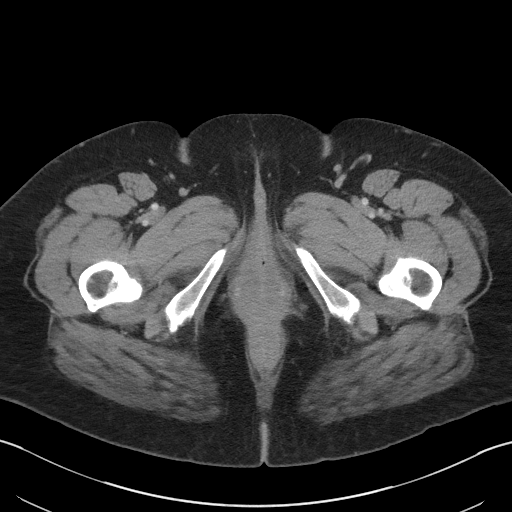
[im 5/97  bone]
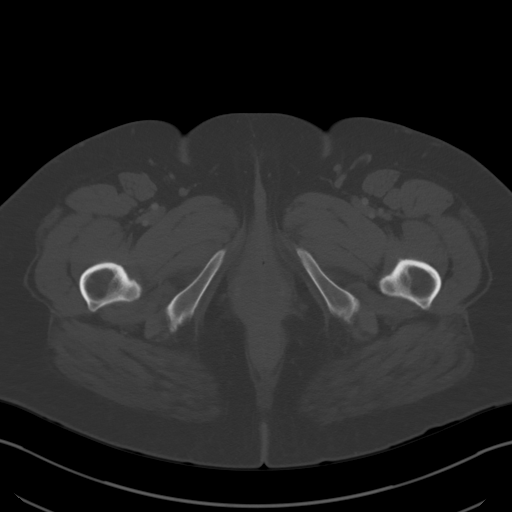
[im 13/97  soft-tissue]
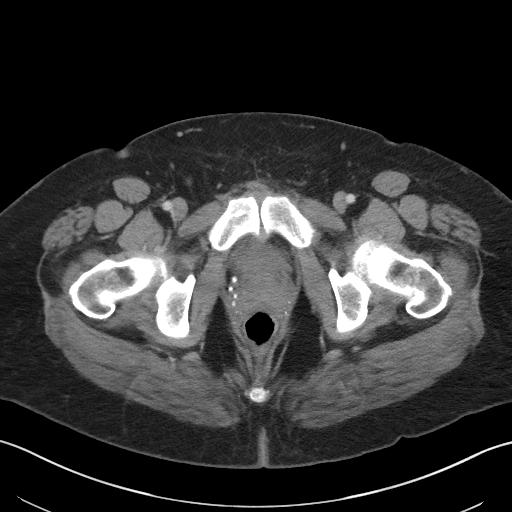
[im 21/97  soft-tissue]
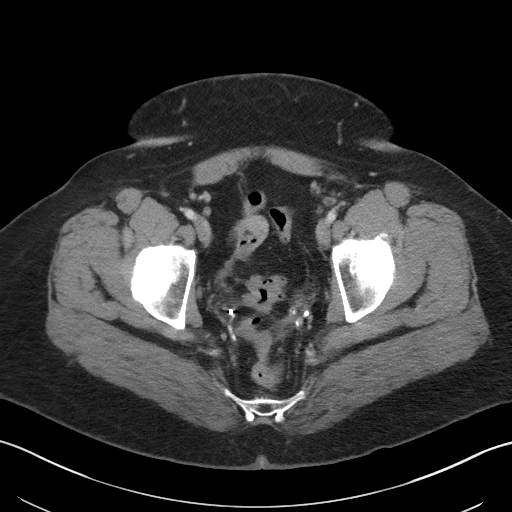
[im 26/97  soft-tissue]
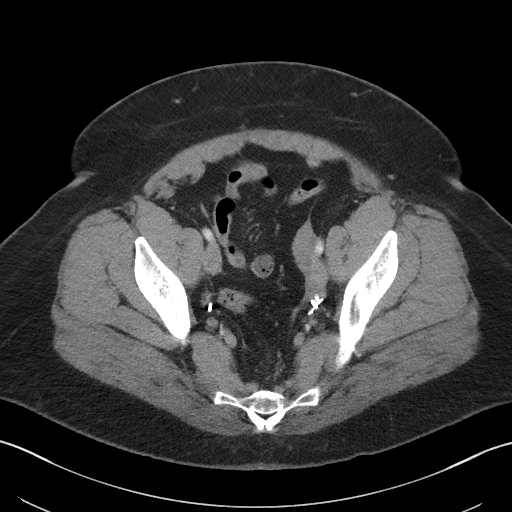
[im 34/97  soft-tissue]
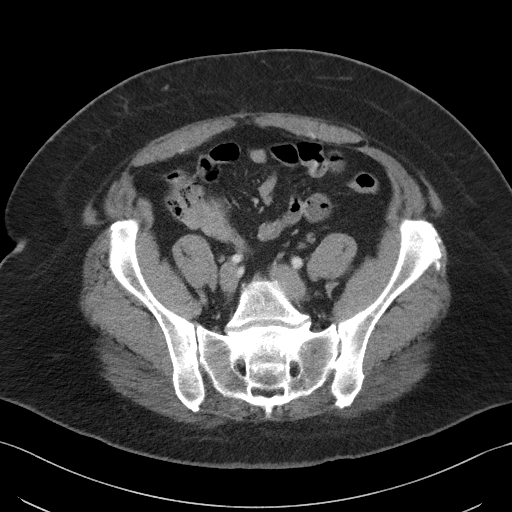
[im 42/97  soft-tissue]
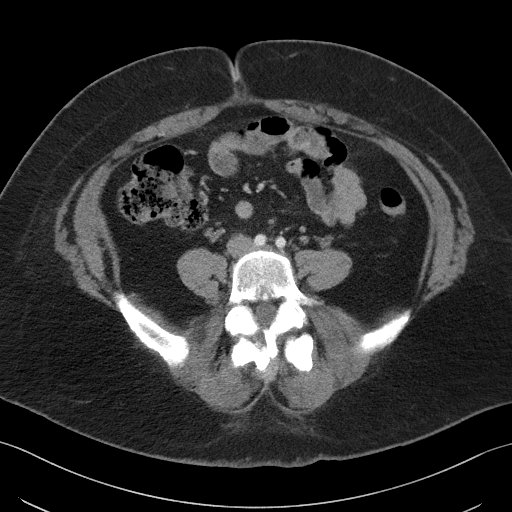
[im 51/97  soft-tissue]
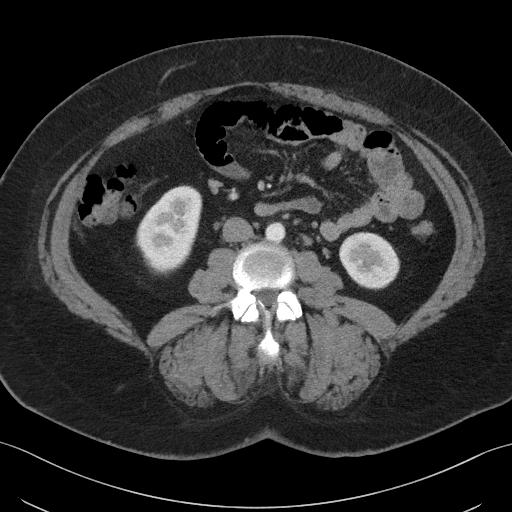
[im 55/97  soft-tissue]
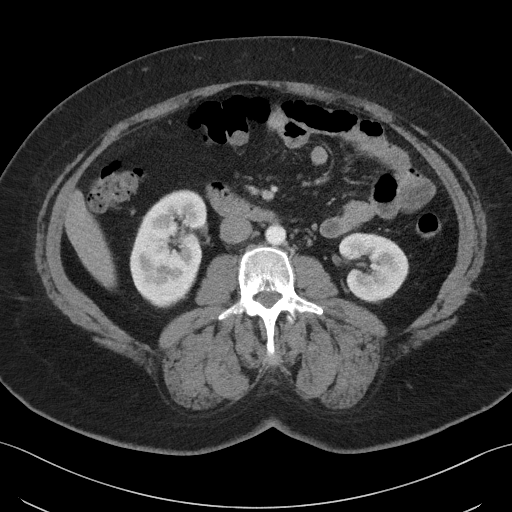
[im 63/97  soft-tissue]
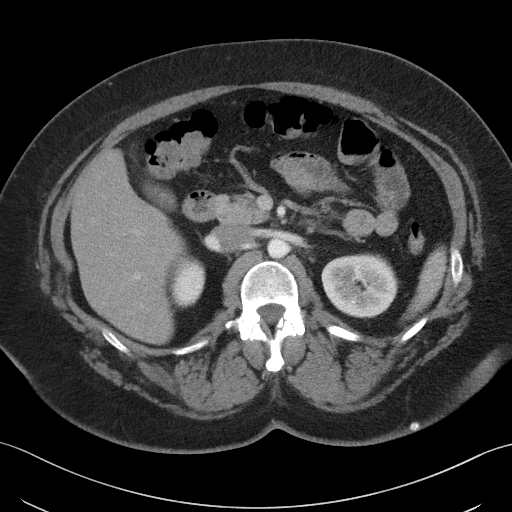
[im 63/97  bone]
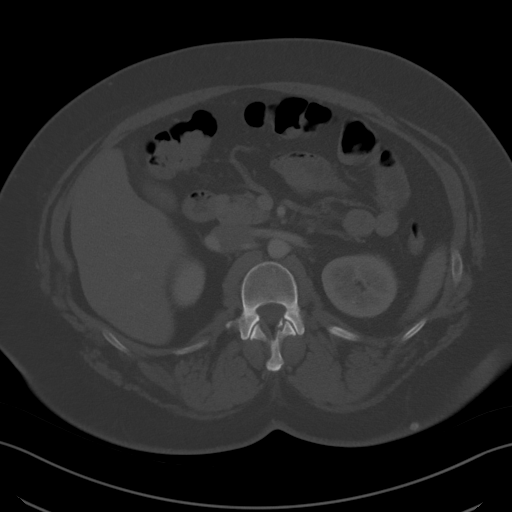
[im 71/97  soft-tissue]
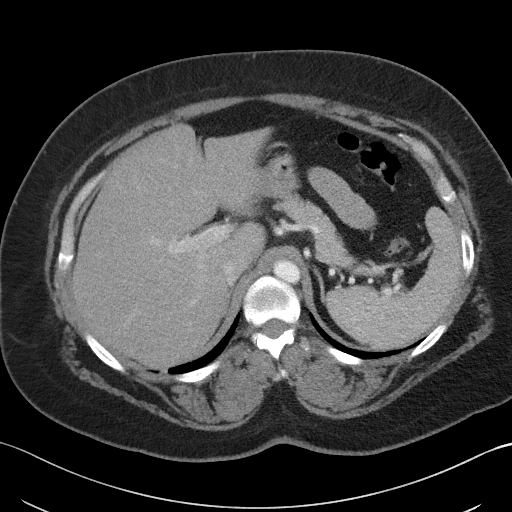
[im 76/97  soft-tissue]
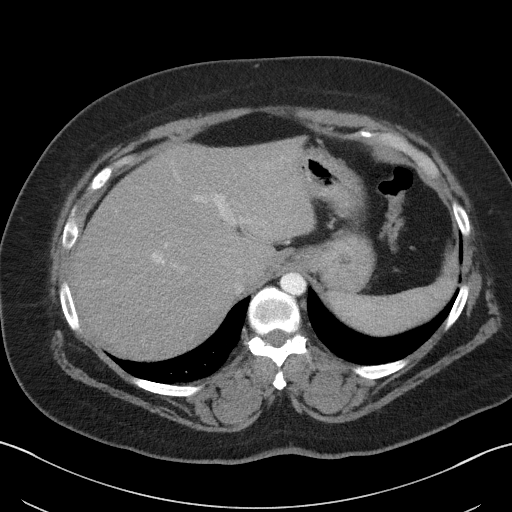
[im 84/97  soft-tissue]
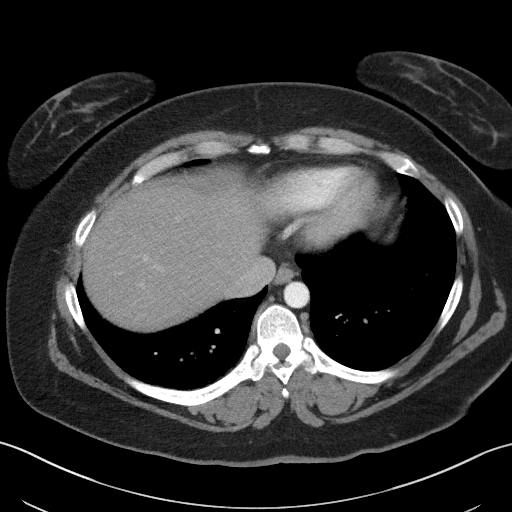
[im 92/97  soft-tissue]
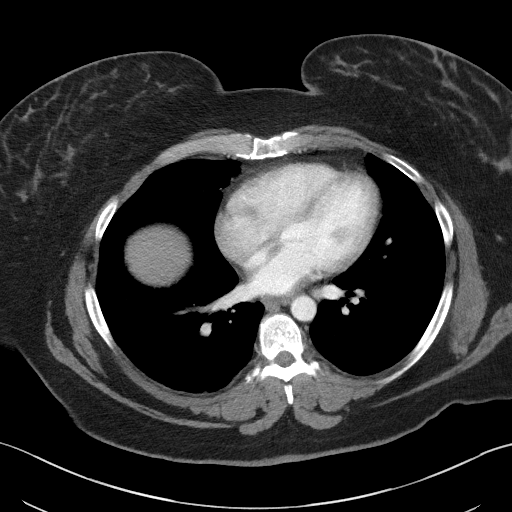

[Series 5: coronal st · coronal · 0.81mm/px · 3 of 106 slices shown]
[im 36/106  soft-tissue]
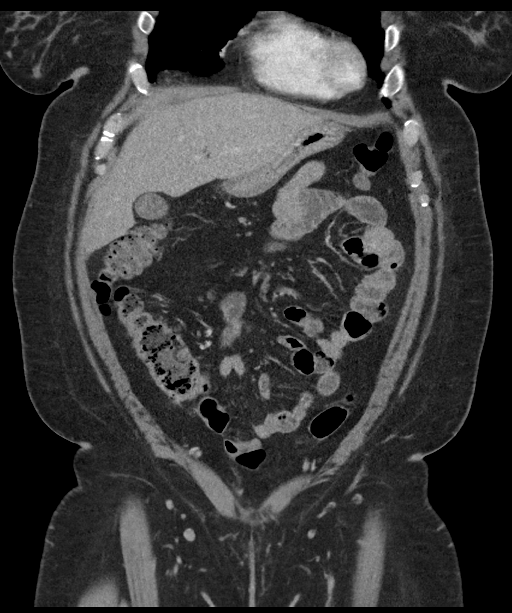
[im 47/106  soft-tissue]
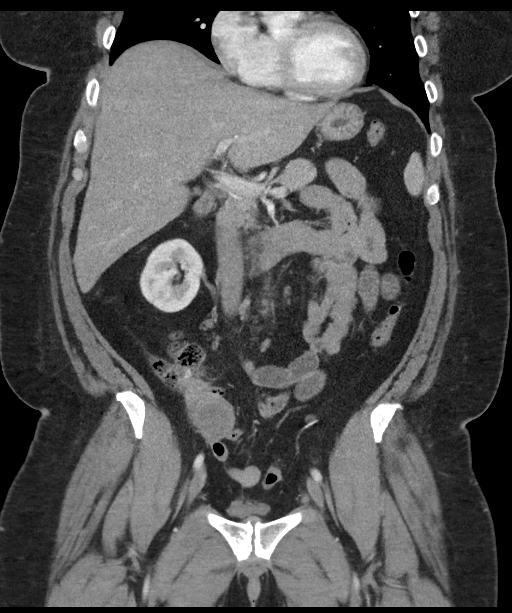
[im 59/106  soft-tissue]
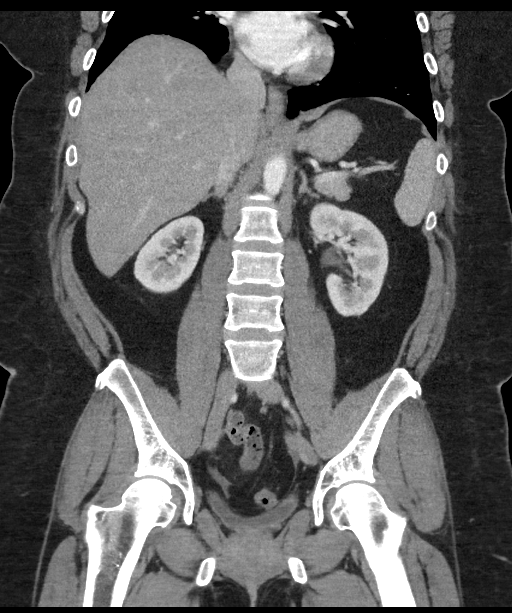

[16 of 46 positions shown; findings below may reference images not displayed]

FINDINGS: Lower chest: The visualized lung bases are clear.

No intra-abdominal free air or free fluid.

Hepatobiliary: The liver is unremarkable. No intrahepatic biliary
ductal dilatation. The gallbladder is predominantly contracted and
contains multiple small stones. No pericholecystic fluid.

Pancreas: Unremarkable. No pancreatic ductal dilatation or
surrounding inflammatory changes.

Spleen: Normal in size without focal abnormality.

Adrenals/Urinary Tract: The adrenal glands are unremarkable. There
is no hydronephrosis on either side. There is symmetric enhancement
of the kidneys. The visualized ureters appear unremarkable. The
urinary bladder is collapsed.

Stomach/Bowel: There is no bowel obstruction or active inflammation.
The appendix is normal.

Vascular/Lymphatic: No significant vascular findings are present. No
enlarged abdominal or pelvic lymph nodes.

Reproductive: Hysterectomy. The left ovary is unremarkable. The
right ovary is enlarged with multiple follicles with the largest
follicle measuring up to 3.5 cm. There is abutment of the right
ovary to the base of the cecum which may represent adhesions.

Other: None

Musculoskeletal: Bilateral L5 pars defects with grade 2 L5-S1
anterolisthesis. There is associated narrowing of the central canal
at L5-S1 as well as narrowing of the neural foramina. No acute
fracture.
IMPRESSION: 1. No bowel obstruction or active inflammation.  Normal appendix.
2. No hydronephrosis.
3. Mildly enlarged right ovary with multiple follicles. The right
ovary abuts the base of the cecum which may represent adhesions.
4. Cholelithiasis.
5. Grade 2 L5-S1 anterolisthesis with associated narrowing of the
central canal and neural foramina at this level.

## 2019-09-29 ENCOUNTER — Ambulatory Visit (INDEPENDENT_AMBULATORY_CARE_PROVIDER_SITE_OTHER): Payer: Medicaid Other | Admitting: Family Medicine

## 2019-09-29 ENCOUNTER — Other Ambulatory Visit: Payer: Self-pay

## 2019-09-29 ENCOUNTER — Encounter: Payer: Self-pay | Admitting: Family Medicine

## 2019-09-29 VITALS — BP 118/62 | HR 99 | Temp 97.1°F | Ht 63.0 in | Wt 212.0 lb

## 2019-09-29 DIAGNOSIS — Z1231 Encounter for screening mammogram for malignant neoplasm of breast: Secondary | ICD-10-CM | POA: Diagnosis not present

## 2019-09-29 DIAGNOSIS — G43809 Other migraine, not intractable, without status migrainosus: Secondary | ICD-10-CM

## 2019-09-29 DIAGNOSIS — Z83438 Family history of other disorder of lipoprotein metabolism and other lipidemia: Secondary | ICD-10-CM | POA: Insufficient documentation

## 2019-09-29 DIAGNOSIS — J3089 Other allergic rhinitis: Secondary | ICD-10-CM | POA: Diagnosis not present

## 2019-09-29 DIAGNOSIS — E669 Obesity, unspecified: Secondary | ICD-10-CM | POA: Diagnosis not present

## 2019-09-29 DIAGNOSIS — N159 Renal tubulo-interstitial disease, unspecified: Secondary | ICD-10-CM

## 2019-09-29 DIAGNOSIS — Z716 Tobacco abuse counseling: Secondary | ICD-10-CM | POA: Diagnosis not present

## 2019-09-29 DIAGNOSIS — G5603 Carpal tunnel syndrome, bilateral upper limbs: Secondary | ICD-10-CM | POA: Diagnosis not present

## 2019-09-29 DIAGNOSIS — R5383 Other fatigue: Secondary | ICD-10-CM | POA: Insufficient documentation

## 2019-09-29 DIAGNOSIS — Z8349 Family history of other endocrine, nutritional and metabolic diseases: Secondary | ICD-10-CM

## 2019-09-29 DIAGNOSIS — G43909 Migraine, unspecified, not intractable, without status migrainosus: Secondary | ICD-10-CM | POA: Insufficient documentation

## 2019-09-29 DIAGNOSIS — Z833 Family history of diabetes mellitus: Secondary | ICD-10-CM

## 2019-09-29 DIAGNOSIS — R632 Polyphagia: Secondary | ICD-10-CM | POA: Diagnosis not present

## 2019-09-29 DIAGNOSIS — F17200 Nicotine dependence, unspecified, uncomplicated: Secondary | ICD-10-CM | POA: Insufficient documentation

## 2019-09-29 DIAGNOSIS — E559 Vitamin D deficiency, unspecified: Secondary | ICD-10-CM | POA: Insufficient documentation

## 2019-09-29 DIAGNOSIS — Z720 Tobacco use: Secondary | ICD-10-CM | POA: Insufficient documentation

## 2019-09-29 HISTORY — DX: Encounter for screening mammogram for malignant neoplasm of breast: Z12.31

## 2019-09-29 HISTORY — DX: Obesity, unspecified: E66.9

## 2019-09-29 HISTORY — DX: Family history of other disorder of lipoprotein metabolism and other lipidemia: Z83.438

## 2019-09-29 HISTORY — DX: Family history of other endocrine, nutritional and metabolic diseases: Z83.49

## 2019-09-29 HISTORY — DX: Family history of diabetes mellitus: Z83.3

## 2019-09-29 HISTORY — DX: Morbid (severe) obesity due to excess calories: E66.01

## 2019-09-29 LAB — POCT URINALYSIS DIP (CLINITEK)
Bilirubin, UA: NEGATIVE
Blood, UA: NEGATIVE
Glucose, UA: NEGATIVE mg/dL
Ketones, POC UA: NEGATIVE mg/dL
Leukocytes, UA: NEGATIVE
Nitrite, UA: NEGATIVE
POC PROTEIN,UA: NEGATIVE
Spec Grav, UA: 1.02 (ref 1.010–1.025)
Urobilinogen, UA: 0.2 E.U./dL
pH, UA: 6.5 (ref 5.0–8.0)

## 2019-09-29 MED ORDER — TOPIRAMATE 25 MG PO TABS: 25.0000 mg | ORAL_TABLET | Freq: Every day | ORAL | 1 refills | Status: DC

## 2019-09-29 MED ORDER — LEVOCETIRIZINE DIHYDROCHLORIDE 5 MG PO TABS: 5.0000 mg | ORAL_TABLET | Freq: Every evening | ORAL | 1 refills | Status: DC

## 2019-09-29 MED ORDER — CHANTIX STARTING MONTH PAK 0.5 MG X 11 & 1 MG X 42 PO TABS: ORAL_TABLET | ORAL | 0 refills | Status: DC

## 2019-09-29 NOTE — Assessment & Plan Note (Signed)
Will be starting Topamax to help with this.  Encouraged to maintain a heart healthy and active lifestyle.   Reviewed side effects, risks and benefits of medication.

## 2019-09-29 NOTE — Assessment & Plan Note (Signed)
Asked about quitting: confirms they are currently smokes cigarettes Advise to quit smoking: Educated about QUITTING to reduce the risk of cancer, cardio and cerebrovascular disease. Assess willingness: willing to quit at this time Assist with counseling and pharmacotherapy: Counseled for 5 minutes and literature provided. Arrange for follow up: working to quitting follow up in 3 months and continue to offer help.   Provided with Chantix today.

## 2019-09-29 NOTE — Assessment & Plan Note (Addendum)
History of having migraines will start Topamax as it also is an appetite suppressant. Close follow-up in 4 weeks to make sure that weight loss is doing well and any adjustments are needed.  Strongly encouraged her to maintain a heart healthy low carbohydrate diet in addition to exercise as that is the main components of good weight loss.  Reviewed side effects, risks and benefits of medication.

## 2019-09-29 NOTE — Assessment & Plan Note (Signed)
Does not care too much for nasal sprays is willing to try Xyzal.  Ordered today. Reviewed side effects, risks and benefits of medication.

## 2019-09-29 NOTE — Assessment & Plan Note (Signed)
Referral to neurology today for follow-up on this.

## 2019-09-29 NOTE — Assessment & Plan Note (Signed)
Improved  April Ayala is educated about the importance of exercise daily to help with weight management. A minumum of 30 minutes daily is recommended. Additionally, importance of healthy food choices  with portion control discussed.  Starting Topamax but I advised that she would only be able to take this for 3 to 6 months.  This is not a long-term solution to weight loss.  Reviewed side effects, risks and benefits of medication.   Patient acknowledged agreement and understanding of the plan.   Wt Readings from Last 3 Encounters:  09/29/19 212 lb (96.2 kg)  05/21/19 203 lb (92.1 kg)  04/20/18 231 lb (104.8 kg)

## 2019-09-29 NOTE — Addendum Note (Signed)
Addended by: Zacarias Pontes R on: 09/29/2019 11:17 AM   Modules accepted: Orders

## 2019-09-29 NOTE — Patient Instructions (Addendum)
I appreciate the opportunity to provide you with care for your health and wellness. Today we discussed: establish care   Follow up: 4 weeks   Labs today  Referral to neurology for hand Mammogram ordered  Start taking medications as ordered, if you have any issues please let me know.  Please continue to practice social distancing to keep you, your family, and our community safe.  If you must go out, please wear a mask and practice good handwashing.  It was a pleasure to see you and I look forward to continuing to work together on your health and well-being. Please do not hesitate to call the office if you need care or have questions about your care.  Have a wonderful day and week. With Gratitude, Cherly Beach, DNP, AGNP-BC

## 2019-09-29 NOTE — Progress Notes (Signed)
Subjective:  Patient ID: April Ayala, female    DOB: Aug 01, 1975  Age: 43 y.o. MRN: [id number]  CC:  Chief Complaint  Patient presents with  . New Patient (Initial Visit)    both hands has carpal tunnel was seeing neurologist but lost insurance and couldnt go back she was seen at [facility] would also like to quit smoking   . Sinus Problem    cough congestion no runny nose and eyes hurt right at nose       HPI  HPI  April Ayala is a 44 year old female patient who presents today to establish care.  Previous history includes allergies, cervical cancer, kidney stones, lumbar back surgery, carpal tunnel, obesity. Previously seen by Dr. Meda Coffee and Dr. Mannie Stabile within the last 3 years Today her biggest concerns are seeing if she can get back into a neurologist secondary to having carpal tunnel diagnosis.  Getting on something to help for smoking sensation she has been on Chantix before and would like to try that again.  She reports Wellbutrin did not help her.  And to see if she can get something for allergies as she has allergies flaring up right now secondary to the seasonal changes.  Health maintenance wise she is up-to-date on all of her needs she does take vaccines.  Will need referral to GYN when Pap smear is due.  She is okay with this.  She reports that she tries to eat a well-balanced diet does not eat much as she is at work and busy.  Currently lives with her husband and 2 children.  She has 4 children all 1 lives with his grandmother and her other daughter is 88 lives with her grandfather.  She is a current everyday smoker.  Denies having any alcohol or drug use.  Does go to the pain clinic for Percocet secondary to having back surgery and back pain.  Overall is doing well and outside of the complaints above has no other concerns today.  Today patient denies signs and symptoms of COVID 19 infection including fever, chills, cough, shortness of breath, and  headache. Past Medical, Surgical, Social History, Allergies, and Medications have been Reviewed.   Past Medical History:  Diagnosis Date  . Allergy   . Cervical cancer ([place]) 04/2011   Stage 1B squamous cell  . Chronic left shoulder pain 10/07/2017  . Chronic tension-type headache, not intractable 10/07/2017  . History of kidney stones    passed  . S/P lumbar fusion 04/20/2018    Current Meds  Medication Sig  . cyclobenzaprine (FLEXERIL) 10 MG tablet Take 10 mg by mouth 3 (three) times daily as needed for muscle spasms.  Marland Kitchen oxyCODONE-acetaminophen (PERCOCET) 10-325 MG tablet Take 1 tablet by mouth 3 (three) times daily as needed for pain.    ROS:  Review of Systems  Constitutional: Negative.        See hpi  HENT: Negative.   Eyes: Negative.   Respiratory: Negative.   Cardiovascular: Negative.   Gastrointestinal: Negative.   Genitourinary: Negative.   Musculoskeletal: Positive for joint pain.  Skin: Negative.   Neurological: Negative.   Endo/Heme/Allergies: Negative.   Psychiatric/Behavioral: Negative.   All other systems reviewed and are negative.    Objective:   Today's Vitals: BP 118/62 (BP Location: Left Arm, Patient Position: Sitting, Cuff Size: Normal)   Pulse 99   Temp (!) 97.1 F (36.2 C) (Temporal)   Ht 5\' 3"  (1.6 m)   Wt 212 lb (96.2  kg)   LMP 04/11/2012   SpO2 96%   BMI 37.55 kg/m  Vitals with BMI 09/29/2019 05/21/2019 05/21/2019  Height 5\' 3"  - -  Weight 212 lbs - -  BMI [id number] - -  Systolic 123456 [id number]  Diastolic 62 87 78  Pulse 99 93 -     Physical Exam Vitals and nursing note reviewed.  Constitutional:      Appearance: Normal appearance. She is well-developed and well-groomed. She is obese.  HENT:     Head: Normocephalic and atraumatic.     Right Ear: External ear normal.     Left Ear: External ear normal.     Mouth/Throat:     Comments: Mask in place  Eyes:     General:        Right eye: No discharge.        Left eye: No discharge.      Conjunctiva/sclera: Conjunctivae normal.  Cardiovascular:     Rate and Rhythm: Normal rate and regular rhythm.     Pulses: Normal pulses.     Heart sounds: Normal heart sounds.  Pulmonary:     Effort: Pulmonary effort is normal.     Breath sounds: Normal breath sounds.  Musculoskeletal:        General: Normal range of motion.     Cervical back: Normal range of motion and neck supple.  Skin:    General: Skin is warm.  Neurological:     General: No focal deficit present.     Mental Status: She is alert and oriented to person, place, and time.  Psychiatric:        Attention and Perception: Attention normal.        Mood and Affect: Mood normal.        Speech: Speech normal.        Behavior: Behavior normal. Behavior is cooperative.        Thought Content: Thought content normal.        Cognition and Memory: Cognition normal.        Judgment: Judgment normal.      Assessment   1. Environmental and seasonal allergies   2. Encounter for smoking cessation counseling   3. Binge eating   4. Other migraine without status migrainosus, not intractable   5. Carpal tunnel syndrome on both sides   6. Encounter for screening mammogram for malignant neoplasm of breast   7. Obesity (BMI 30-39.9)   8. Family history of hyperlipidemia   9. Family history of diabetes mellitus   10. Family history of hypothyroidism   11. Other fatigue   12. Vitamin D deficiency     Tests ordered Orders Placed This Encounter  Procedures  . MM 3D SCREEN BREAST BILATERAL  . CBC  . COMPLETE METABOLIC PANEL WITH GFR  . Hemoglobin A1c  . Lipid panel  . TSH  . VITAMIN D 25 Hydroxy (Vit-D Deficiency, Fractures)  . Ambulatory referral to Neurology     Plan: Please see assessment and plan per problem list above.   Meds ordered this encounter  Medications  . levocetirizine (XYZAL) 5 MG tablet    Sig: Take 1 tablet (5 mg total) by mouth every evening.    Dispense:  30 tablet    Refill:  1    Order  Specific Question:   Supervising Provider    Answer:   SIMPSON, MARGARET E [id number]  . varenicline (CHANTIX STARTING MONTH PAK) 0.5 MG X 11 & 1 MG X 42  tablet    Sig: Take one 0.5 mg tablet by mouth once daily for 3 days, then increase to one 0.5 mg tablet twice daily for 4 days, then increase to one 1 mg tablet twice daily.    Dispense:  53 tablet    Refill:  0    Order Specific Question:   Supervising Provider    Answer:   SIMPSON, MARGARET E [id number]  . topiramate (TOPAMAX) 25 MG tablet    Sig: Take 1 tablet (25 mg total) by mouth daily.    Dispense:  30 tablet    Refill:  1    Order Specific Question:   Supervising Provider    Answer:   Fayrene Helper [id number]    Patient to follow-up in 4 weeks.  Perlie Mayo, NP

## 2019-09-30 LAB — CBC
HCT: 48.2 % — ABNORMAL HIGH (ref 35.0–45.0)
Hemoglobin: 16 g/dL — ABNORMAL HIGH (ref 11.7–15.5)
MCH: 30.2 pg (ref 27.0–33.0)
MCHC: 33.2 g/dL (ref 32.0–36.0)
MCV: 90.9 fL (ref 80.0–100.0)
MPV: 10.6 fL (ref 7.5–12.5)
Platelets: 329 10*3/uL (ref 140–400)
RBC: 5.3 10*6/uL — ABNORMAL HIGH (ref 3.80–5.10)
RDW: 13 % (ref 11.0–15.0)
WBC: 11.1 10*3/uL — ABNORMAL HIGH (ref 3.8–10.8)

## 2019-09-30 LAB — COMPLETE METABOLIC PANEL WITH GFR
AG Ratio: 1.9 (calc) (ref 1.0–2.5)
ALT: 21 U/L (ref 6–29)
AST: 22 U/L (ref 10–30)
Albumin: 4.4 g/dL (ref 3.6–5.1)
Alkaline phosphatase (APISO): 64 U/L (ref 31–125)
BUN: 15 mg/dL (ref 7–25)
CO2: 26 mmol/L (ref 20–32)
Calcium: 9.5 mg/dL (ref 8.6–10.2)
Chloride: 105 mmol/L (ref 98–110)
Creat: 0.74 mg/dL (ref 0.50–1.10)
GFR, Est African American: 117 mL/min/{1.73_m2} (ref >=60)
GFR, Est Non African American: 101 mL/min/{1.73_m2} (ref >=60)
Globulin: 2.3 g/dL (calc) (ref 1.9–3.7)
Glucose, Bld: 91 mg/dL (ref 65–99)
Potassium: 4.5 mmol/L (ref 3.5–5.3)
Sodium: 138 mmol/L (ref 135–146)
Total Bilirubin: 0.5 mg/dL (ref 0.2–1.2)
Total Protein: 6.7 g/dL (ref 6.1–8.1)

## 2019-09-30 LAB — LIPID PANEL
Cholesterol: 189 mg/dL (ref <200)
HDL: 43 mg/dL — ABNORMAL LOW (ref >=50)
LDL Cholesterol (Calc): 122 mg/dL (calc) — ABNORMAL HIGH
Non-HDL Cholesterol (Calc): 146 mg/dL (calc) — ABNORMAL HIGH (ref <130)
Total CHOL/HDL Ratio: 4.4 (calc) (ref <5.0)
Triglycerides: 126 mg/dL (ref <150)

## 2019-09-30 LAB — VITAMIN D 25 HYDROXY (VIT D DEFICIENCY, FRACTURES): Vit D, 25-Hydroxy: 13 ng/mL — ABNORMAL LOW (ref 30–100)

## 2019-09-30 LAB — TSH: TSH: 2.99 mIU/L

## 2019-09-30 LAB — HEMOGLOBIN A1C
Hgb A1c MFr Bld: 5.1 % of total Hgb (ref <5.7)
Mean Plasma Glucose: 100 (calc)
eAG (mmol/L): 5.5 (calc)

## 2019-10-10 ENCOUNTER — Other Ambulatory Visit: Payer: Self-pay

## 2019-10-10 MED ORDER — VITAMIN D (ERGOCALCIFEROL) 1.25 MG (50000 UNIT) PO CAPS: 50000.0000 [IU] | ORAL_CAPSULE | ORAL | 3 refills | Status: DC

## 2019-10-12 DIAGNOSIS — H5213 Myopia, bilateral: Secondary | ICD-10-CM | POA: Diagnosis not present

## 2019-10-16 ENCOUNTER — Other Ambulatory Visit: Payer: Self-pay

## 2019-10-16 ENCOUNTER — Ambulatory Visit (HOSPITAL_COMMUNITY)
Admission: RE | Admit: 2019-10-16 | Discharge: 2019-10-16 | Disposition: A | Discharge status: Home / Self Care | Payer: Medicaid Other | Source: Ambulatory Visit | Attending: Family Medicine | Admitting: Family Medicine

## 2019-10-16 DIAGNOSIS — Z1231 Encounter for screening mammogram for malignant neoplasm of breast: Secondary | ICD-10-CM | POA: Diagnosis not present

## 2019-10-24 DIAGNOSIS — H52223 Regular astigmatism, bilateral: Secondary | ICD-10-CM | POA: Diagnosis not present

## 2019-10-24 DIAGNOSIS — H5213 Myopia, bilateral: Secondary | ICD-10-CM | POA: Diagnosis not present

## 2019-10-27 ENCOUNTER — Other Ambulatory Visit: Payer: Self-pay

## 2019-10-27 ENCOUNTER — Telehealth (INDEPENDENT_AMBULATORY_CARE_PROVIDER_SITE_OTHER): Payer: Medicaid Other | Admitting: Family Medicine

## 2019-10-27 ENCOUNTER — Encounter: Payer: Self-pay | Admitting: Family Medicine

## 2019-10-27 VITALS — BP 118/62 | Ht 63.0 in | Wt 212.0 lb

## 2019-10-27 DIAGNOSIS — G43809 Other migraine, not intractable, without status migrainosus: Secondary | ICD-10-CM | POA: Diagnosis not present

## 2019-10-27 DIAGNOSIS — Z716 Tobacco abuse counseling: Secondary | ICD-10-CM | POA: Diagnosis not present

## 2019-10-27 DIAGNOSIS — Z8541 Personal history of malignant neoplasm of cervix uteri: Secondary | ICD-10-CM | POA: Diagnosis not present

## 2019-10-27 HISTORY — DX: Personal history of malignant neoplasm of cervix uteri: Z85.41

## 2019-10-27 MED ORDER — VARENICLINE TARTRATE 1 MG PO TABS: 1.0000 mg | ORAL_TABLET | Freq: Two times a day (BID) | ORAL | 0 refills | Status: DC

## 2019-10-27 MED ORDER — AMITRIPTYLINE HCL 25 MG PO TABS: 25.0000 mg | ORAL_TABLET | Freq: Every day | ORAL | 0 refills | Status: DC

## 2019-10-27 NOTE — Assessment & Plan Note (Signed)
History of having migraines will stop Topamax and start her on Elavil at night.  Close follow-up in 3 weeks to see how she is doing and medication adjustment.  She did not want to increase the Topamax to see if that would be beneficial.  She is strongly encouraged to maintain a heart healthy low carbohydrate diet in addition to exercise for at least 30 to 60 minutes a day as components of good weight loss.  At this time we will refrain from doing a weight loss medication secondary to having migraines.  Reviewed side effects, risks and benefits of medication.

## 2019-10-27 NOTE — Assessment & Plan Note (Signed)
Asked about quitting: confirms they are currently smokes cigarettes Advise to quit smoking: Educated about QUITTING to reduce the risk of cancer, cardio and cerebrovascular disease. Assess willingness: Unwilling to quit at this time, but is working on cutting back. Assist with counseling and pharmacotherapy: Counseled for 5 minutes and literature provided. Arrange for follow up: working on quitting follow up in 3 months and continue to offer help.   Continue Chantix at this time

## 2019-10-27 NOTE — Progress Notes (Signed)
Virtual Visit via Telephone Note   This visit type was conducted due to national recommendations for restrictions regarding the COVID-19 Pandemic (e.g. social distancing) in an effort to limit this patient's exposure and mitigate transmission in our community.  Due to her co-morbid illnesses, this patient is at least at moderate risk for complications without adequate follow up.  This format is felt to be most appropriate for this patient at this time.  The patient did not have access to video technology/had technical difficulties with video requiring transitioning to audio format only (telephone).  All issues noted in this document were discussed and addressed.  No physical exam could be performed with this format.   Evaluation Performed:  Follow-up visit  Date:  10/27/2019   ID:  April Ayala, DOB 09/15/1975, MRN [id number]  Patient Location: Home Provider Location: Office  Location of Patient: Home Location of Provider: Telehealth Consent was obtain for visit to be over via telehealth. I verified that I am speaking with the correct person using two identifiers.  PCP:  Perlie Mayo, NP   Chief Complaint: Migraine follow-up  History of Present Illness:    April Ayala is a 44 y.o. female with recent establishment of care with me.  Has history of allergies, cervical cancer, migraines, carpal tunnel, obesity among others.  Presents today for follow-up on migraine medication was started on Topamax at the last visit she reports that it did not help with her binge eating and or help her with her migraines very much.  Would like to try something different.  She denies having any changes in her symptoms from her migraines at previous visit.  She also reports that she needs to get her Chantix refilled at 1 mg she reports she is doing well on this and is trying to continue the course.  And she would like to get a referral for GYN as she has had cervical cancer and her GYN  retired.  The patient does not have symptoms concerning for COVID-19 infection (fever, chills, cough, or new shortness of breath).   Past Medical, Surgical, Social History, Allergies, and Medications have been Reviewed.  Past Medical History:  Diagnosis Date  . Allergy   . Cervical cancer ([place]) 04/2011   Stage 1B squamous cell  . Chronic left shoulder pain 10/07/2017  . Chronic tension-type headache, not intractable 10/07/2017  . History of kidney stones    passed  . S/P lumbar fusion 04/20/2018   Past Surgical History:  Procedure Laterality Date  . ABDOMINAL HYSTERECTOMY    . BREAST BIOPSY Right   . CERVICAL CONIZATION W/BX  03/24/2012   Procedure: CONIZATION CERVIX WITH BIOPSY;  Surgeon: Woodroe Mode, MD;  Location: [facility];  Service: Gynecology;  Laterality: N/A;  . DIAGNOSTIC LAPAROSCOPY  2007   ectopic  . ECTOPIC PREGNANCY SURGERY    . ESSURE TUBAL LIGATION  2010  . INSERTION OF SUPRAPUBIC CATHETER  05/03/2012   Procedure: INSERTION OF SUPRAPUBIC CATHETER;  Surgeon: Alvino Chapel, MD;  Location: [facility];  Service: Gynecology;;  . LEEP    . LUMBAR LAMINECTOMY/DECOMPRESSION MICRODISCECTOMY N/A 04/20/2018   Procedure: LUMBAR FIVE TO SACRAL ONE DECOMPRESSION AND FUSION, POSSIBLE LUMBAR FOUR TO SACRAL ONE; SCREW AND CAGE INSTRUMENTATION;  Surgeon: Melina Schools, MD;  Location: [facility];  Service: Orthopedics;  Laterality: N/A;  5 hrs  . LYMPHADENECTOMY  05/03/2012   Procedure: LYMPHADENECTOMY;  Surgeon: Alvino Chapel, MD;  Location: [facility];  Service: Gynecology;  Laterality: Bilateral;  pelvic  . RADICAL HYSTERECTOMY  05/03/2012   Procedure: RADICAL HYSTERECTOMY;  Surgeon: Alvino Chapel, MD;  Location: [facility];  Service: Gynecology;;  transposition of the ovaries  . SPINE SURGERY    . TUBAL LIGATION       Current Meds  Medication Sig  . cyclobenzaprine (FLEXERIL) 10 MG tablet Take 10 mg by mouth 3 (three) times daily as needed for muscle spasms.  Marland Kitchen  levocetirizine (XYZAL) 5 MG tablet Take 1 tablet (5 mg total) by mouth every evening.  Marland Kitchen oxyCODONE-acetaminophen (PERCOCET) 10-325 MG tablet Take 1 tablet by mouth 3 (three) times daily as needed for pain.  Marland Kitchen topiramate (TOPAMAX) 25 MG tablet Take 1 tablet (25 mg total) by mouth daily.  . varenicline (CHANTIX STARTING MONTH PAK) 0.5 MG X 11 & 1 MG X 42 tablet Take one 0.5 mg tablet by mouth once daily for 3 days, then increase to one 0.5 mg tablet twice daily for 4 days, then increase to one 1 mg tablet twice daily.  . Vitamin D, Ergocalciferol, (DRISDOL) 1.25 MG (50000 UNIT) CAPS capsule Take 1 capsule (50,000 Units total) by mouth every 7 (seven) days.     Allergies:   Patient has no known allergies.   ROS:   Please see the history of present illness.     All other systems reviewed and are negative.   Labs/Other Tests and Data Reviewed:    Recent Labs: 09/29/2019: ALT 21; BUN 15; Creat 0.74; Hemoglobin 16.0; Platelets 329; Potassium 4.5; Sodium 138; TSH 2.99   Recent Lipid Panel Lab Results  Component Value Date/Time   CHOL 189 09/29/2019 11:05 AM   TRIG 126 09/29/2019 11:05 AM   HDL 43 (L) 09/29/2019 11:05 AM   CHOLHDL 4.4 09/29/2019 11:05 AM   LDLCALC 122 (H) 09/29/2019 11:05 AM    Wt Readings from Last 3 Encounters:  10/27/19 212 lb (96.2 kg)  09/29/19 212 lb (96.2 kg)  05/21/19 203 lb (92.1 kg)     Objective:    Vital Signs:  BP 118/62   Ht 5\' 3"  (1.6 m)   Wt 212 lb (96.2 kg)   LMP 04/11/2012   BMI 37.55 kg/m    VITAL SIGNS:  reviewed GEN:  Alert and oriented RESPIRATORY:  No shortness of breath noted in conversation PSYCH:  normal affect    Depression screen [facility] 2/9 10/27/2019 09/29/2019 03/16/2018 02/23/2018 07/20/2017  Decreased Interest 0 0 2 2 0  Down, Depressed, Hopeless 0 0 0 0 0  PHQ - 2 Score 0 0 2 2 0  Altered sleeping 3 - 3 3 -  Tired, decreased energy 0 - 2 2 -  Change in appetite 0 - 2 3 -  Feeling bad or failure about yourself  0 - 0 0 -    Trouble concentrating 0 - 3 3 -  Moving slowly or fidgety/restless 0 - 2 2 -  Suicidal thoughts 0 - 0 0 -  PHQ-9 Score 3 - 14 15 -  Difficult doing work/chores Not difficult at all - Somewhat difficult Somewhat difficult -      ASSESSMENT & PLAN:    1. Encounter for smoking cessation counseling  - varenicline (CHANTIX) 1 MG tablet; Take 1 tablet (1 mg total) by mouth 2 (two) times daily.  Dispense: 30 tablet; Refill: 0  2. Other migraine without status migrainosus, not intractable  - amitriptyline (ELAVIL) 25 MG tablet; Take 1 tablet (25 mg total) by mouth at bedtime.  Dispense: 30 tablet; Refill: 0  3. History of cervical cancer  - Ambulatory referral to Obstetrics / Gynecology   Time:   Today, I have spent 15 minutes with the patient with telehealth technology discussing the above problems.     Medication Adjustments/Labs and Tests Ordered: Current medicines are reviewed at length with the patient today.  Concerns regarding medicines are outlined above.   Tests Ordered: No orders of the defined types were placed in this encounter.   Medication Changes: No orders of the defined types were placed in this encounter.   Disposition:  Follow up 3 weeks phone Signed, Perlie Mayo, NP  10/27/2019 10:31 AM     [place]

## 2019-10-27 NOTE — Patient Instructions (Signed)
I appreciate the opportunity to provide you with care for your health and wellness. Today we discussed: migraines-med changes  Follow up: 3.5 weeks migraines  No labs or referrals today  CONTINUE TO WORK ON THE SMOKING CESSATION! GOOD JOB ON YOUR PROGRESS AS OF NOW.  Start taking Elavil at bedtime. Stop Topamax -you might get headaches from withdraw.  Continue Chantix  Please continue to practice social distancing to keep you, your family, and our community safe.  If you must go out, please wear a mask and practice good handwashing.  It was a pleasure to see you and I look forward to continuing to work together on your health and well-being. Please do not hesitate to call the office if you need care or have questions about your care.  Have a wonderful day and week. With Gratitude, Cherly Beach, DNP, AGNP-BC

## 2019-11-02 ENCOUNTER — Encounter: Payer: Self-pay | Admitting: Family Medicine

## 2019-11-08 ENCOUNTER — Encounter: Payer: Medicaid Other | Admitting: Adult Health

## 2019-11-08 ENCOUNTER — Encounter: Payer: Self-pay | Admitting: Family Medicine

## 2019-11-09 ENCOUNTER — Other Ambulatory Visit: Payer: Self-pay | Admitting: *Deleted

## 2019-11-09 ENCOUNTER — Encounter: Payer: Self-pay | Admitting: Family Medicine

## 2019-11-09 DIAGNOSIS — Z716 Tobacco abuse counseling: Secondary | ICD-10-CM

## 2019-11-09 MED ORDER — VARENICLINE TARTRATE 1 MG PO TABS: 1.0000 mg | ORAL_TABLET | Freq: Two times a day (BID) | ORAL | 0 refills | Status: DC

## 2019-11-13 ENCOUNTER — Ambulatory Visit: Payer: BLUE CROSS/BLUE SHIELD | Admitting: Neurology

## 2019-11-13 ENCOUNTER — Telehealth: Payer: Self-pay | Admitting: *Deleted

## 2019-11-13 NOTE — Telephone Encounter (Signed)
I called pt to r/s today's appt 11/13/19 (d/t provider out of office). I LVM offering Thursday AM on 11/30/19. When pt calls back, please offer one of the openings that morning unless we have a sooner opening. If pt cannot come 5/27 you can offer one of the openings on 6/2.

## 2019-11-14 ENCOUNTER — Other Ambulatory Visit: Payer: Self-pay | Admitting: Family Medicine

## 2019-11-14 DIAGNOSIS — E7841 Elevated Lipoprotein(a): Secondary | ICD-10-CM

## 2019-11-14 MED ORDER — ROSUVASTATIN CALCIUM 5 MG PO TABS: 5.0000 mg | ORAL_TABLET | Freq: Every day | ORAL | 1 refills | Status: DC

## 2019-11-14 NOTE — Telephone Encounter (Signed)
I saw pt was r/s for 01/04/20. I called the pt to offer sooner appts. We had an afternoon tomorrow and several morning openings on 5/27 @ 6/2. Pt was unable to come sooner in the availability we had. She is getting ready to start a new job and after she gets her work schedule she will call to see what we have. Will keep 7/1 appt the same for now. She verbalized appreciation for the call.

## 2019-11-14 NOTE — Telephone Encounter (Signed)
Tried to reach pt again. LVM asking for call back to schedule. When she calls back, please offer 11/15/19 @ 3 pm if it is still available. If it is not offer, please offer one of the AM slots on 11/30/19 or 12/06/19 if she prefers that date.

## 2019-11-18 ENCOUNTER — Other Ambulatory Visit: Payer: Self-pay | Admitting: Family Medicine

## 2019-11-18 DIAGNOSIS — G43809 Other migraine, not intractable, without status migrainosus: Secondary | ICD-10-CM

## 2019-11-18 DIAGNOSIS — Z716 Tobacco abuse counseling: Secondary | ICD-10-CM

## 2019-11-22 ENCOUNTER — Encounter: Payer: Self-pay | Admitting: Family Medicine

## 2019-11-22 ENCOUNTER — Other Ambulatory Visit: Payer: Self-pay

## 2019-11-22 ENCOUNTER — Ambulatory Visit (INDEPENDENT_AMBULATORY_CARE_PROVIDER_SITE_OTHER): Payer: Medicaid Other | Admitting: Family Medicine

## 2019-11-22 ENCOUNTER — Other Ambulatory Visit (HOSPITAL_COMMUNITY)
Admission: RE | Admit: 2019-11-22 | Discharge: 2019-11-22 | Disposition: A | Discharge status: Home / Self Care | Payer: Medicaid Other | Source: Other Acute Inpatient Hospital | Attending: Family Medicine | Admitting: Family Medicine

## 2019-11-22 VITALS — BP 118/62 | Ht 63.0 in | Wt 212.0 lb

## 2019-11-22 DIAGNOSIS — G43809 Other migraine, not intractable, without status migrainosus: Secondary | ICD-10-CM

## 2019-11-22 DIAGNOSIS — N3000 Acute cystitis without hematuria: Secondary | ICD-10-CM | POA: Diagnosis not present

## 2019-11-22 DIAGNOSIS — R300 Dysuria: Secondary | ICD-10-CM | POA: Insufficient documentation

## 2019-11-22 DIAGNOSIS — Z716 Tobacco abuse counseling: Secondary | ICD-10-CM

## 2019-11-22 LAB — POCT URINALYSIS DIP (CLINITEK)
Bilirubin, UA: NEGATIVE
Blood, UA: NEGATIVE
Glucose, UA: NEGATIVE mg/dL
Ketones, POC UA: NEGATIVE mg/dL
Nitrite, UA: NEGATIVE
POC PROTEIN,UA: NEGATIVE
Spec Grav, UA: 1.02 (ref 1.010–1.025)
Urobilinogen, UA: 0.2 E.U./dL
pH, UA: 7 (ref 5.0–8.0)

## 2019-11-22 MED ORDER — AMITRIPTYLINE HCL 50 MG PO TABS: 50.0000 mg | ORAL_TABLET | Freq: Every day | ORAL | 2 refills | Status: DC

## 2019-11-22 MED ORDER — VARENICLINE TARTRATE 1 MG PO TABS: 1.0000 mg | ORAL_TABLET | Freq: Two times a day (BID) | ORAL | 0 refills | Status: DC

## 2019-11-22 MED ORDER — RIZATRIPTAN BENZOATE 5 MG PO TABS: 5.0000 mg | ORAL_TABLET | ORAL | 0 refills | Status: DC | PRN

## 2019-11-22 NOTE — Assessment & Plan Note (Signed)
Asked about quitting: confirms they are currently smokes cigarettes Advise to quit smoking: Educated about QUITTING to reduce the risk of cancer, cardio and cerebrovascular disease. Assess willingness: Unwilling to quit at this time, but is working on cutting back. Assist with counseling and pharmacotherapy: Counseled for 5 minutes and literature provided. Arrange for follow up: Working on quitting quitting follow up in 3 months and continue to offer help.   Continue Chantix at this time

## 2019-11-22 NOTE — Patient Instructions (Signed)
I appreciate the opportunity to provide you with care for your health and wellness. Today we discussed: Migraine  Follow up: 8 weeks for migraine follow-up and to discuss weight loss options  No labs or referrals today  Continue taking the Elavil at night I have increased this to 50 mg today.  I have sent in a medication called Maxalt 5 mg that she will take as needed for breakthrough headaches that occur throughout the week.  If you have more than 3 of these in a week please let me know.  I have continued Chantix for months.  Hopefully you will be able to stop smoking within this month.   Proud of you for working on this please continue to do so.  Please continue to practice social distancing to keep you, your family, and our community safe.  If you must go out, please wear a mask and practice good handwashing.  It was a pleasure to see you and I look forward to continuing to work together on your health and well-being. Please do not hesitate to call the office if you need care or have questions about your care.  Have a wonderful day and week. With Gratitude, Cherly Beach, DNP, AGNP-BC

## 2019-11-22 NOTE — Assessment & Plan Note (Signed)
Much improved on Elavil versus the Topamax.  Will continue to increase to 50 mg at this time as she reports that she still is getting breakthrough headaches.  Will prescribe with Maxalt for breakthrough migraines as she has them.  Encouraged her to reach out if she is having more than 3 a week.  Strongly encouraged her to make sure that she is staying focused on identifying triggers to avoid them.  Drinking plenty of water avoiding excessive caffeine and sugar and lack of sleep. Patient acknowledged agreement and understanding of the plan.

## 2019-11-22 NOTE — Progress Notes (Signed)
Virtual Visit via Telephone Note   This visit type was conducted due to national recommendations for restrictions regarding the COVID-19 Pandemic (e.g. social distancing) in an effort to limit this patient's exposure and mitigate transmission in our community.  Due to her co-morbid illnesses, this patient is at least at moderate risk for complications without adequate follow up.  This format is felt to be most appropriate for this patient at this time.  The patient did not have access to video technology/had technical difficulties with video requiring transitioning to audio format only (telephone).  All issues noted in this document were discussed and addressed.  No physical exam could be performed with this format.   Evaluation Performed:  Follow-up visit  Date:  11/22/2019   ID:  April Ayala, DOB [birth year], MRN [id number]  Patient Location: Home Provider Location: Office  Location of Patient: Home Location of Provider: Telehealth Consent was obtain for visit to be over via telehealth. I verified that I am speaking with the correct person using two identifiers.  PCP:  Perlie Mayo, NP   Chief Complaint: Migraine  History of Present Illness:    April Ayala is a 44 y.o. female with history of cervical cancer, chronic shoulder pain, kidney stones, allergies, migraines.  Presents today for follow-up on migraines.  She was started on Topamax previously earlier in the year to help with binge eating as well as migraines but reports that it did not help her.  Would like to try something different instead of increasing the Topamax.  So we started her on Elavil which she reports is helping her very much with her migraines and she just needs a little bit better control as she has had several breakthroughs over the last month.  Additionally she would like a refill of her Chantix.   Dysuria: She reports that she has had some increase pain with voiding over the last couple days.   She reports that she has not seen much blood in her urine visibly.  She has a history of having kidney stones.  She does have back pain.  She does not have any pelvic pain.  Denies having any nausea, vomiting, fevers, chills.  Mild increase in frequency of urination.  The patient does not have symptoms concerning for COVID-19 infection (fever, chills, cough, or new shortness of breath).   Past Medical, Surgical, Social History, Allergies, and Medications have been Reviewed.  Past Medical History:  Diagnosis Date  . Allergy   . Cervical cancer ([place]) 04/2011   Stage 1B squamous cell  . Chronic left shoulder pain 10/07/2017  . Chronic tension-type headache, not intractable 10/07/2017  . History of kidney stones    passed  . S/P lumbar fusion 04/20/2018   Past Surgical History:  Procedure Laterality Date  . ABDOMINAL HYSTERECTOMY    . BREAST BIOPSY Right   . CERVICAL CONIZATION W/BX  03/24/2012   Procedure: CONIZATION CERVIX WITH BIOPSY;  Surgeon: Woodroe Mode, MD;  Location: [facility];  Service: Gynecology;  Laterality: N/A;  . DIAGNOSTIC LAPAROSCOPY  2007   ectopic  . ECTOPIC PREGNANCY SURGERY    . ESSURE TUBAL LIGATION  2010  . INSERTION OF SUPRAPUBIC CATHETER  05/03/2012   Procedure: INSERTION OF SUPRAPUBIC CATHETER;  Surgeon: Alvino Chapel, MD;  Location: [facility];  Service: Gynecology;;  . LEEP    . LUMBAR LAMINECTOMY/DECOMPRESSION MICRODISCECTOMY N/A 04/20/2018   Procedure: LUMBAR FIVE TO SACRAL ONE DECOMPRESSION AND FUSION, POSSIBLE LUMBAR FOUR  TO SACRAL ONE; SCREW AND CAGE INSTRUMENTATION;  Surgeon: Melina Schools, MD;  Location: [place];  Service: Orthopedics;  Laterality: N/A;  5 hrs  . LYMPHADENECTOMY  05/03/2012   Procedure: LYMPHADENECTOMY;  Surgeon: Alvino Chapel, MD;  Location: [facility];  Service: Gynecology;  Laterality: Bilateral;  pelvic  . RADICAL HYSTERECTOMY  05/03/2012   Procedure: RADICAL HYSTERECTOMY;  Surgeon: Alvino Chapel, MD;  Location: [facility];  Service: Gynecology;;  transposition of the ovaries  . SPINE SURGERY    . TUBAL LIGATION       Current Meds  Medication Sig  . amitriptyline (ELAVIL) 25 MG tablet Take 1 tablet (25 mg total) by mouth at bedtime.  . cyclobenzaprine (FLEXERIL) 10 MG tablet Take 10 mg by mouth 3 (three) times daily as needed for muscle spasms.  Marland Kitchen levocetirizine (XYZAL) 5 MG tablet Take 1 tablet (5 mg total) by mouth every evening.  Marland Kitchen oxyCODONE-acetaminophen (PERCOCET) 10-325 MG tablet Take 1 tablet by mouth 3 (three) times daily as needed for pain.  . rosuvastatin (CRESTOR) 5 MG tablet Take 1 tablet (5 mg total) by mouth daily.  . varenicline (CHANTIX) 1 MG tablet Take 1 tablet (1 mg total) by mouth 2 (two) times daily.  . Vitamin D, Ergocalciferol, (DRISDOL) 1.25 MG (50000 UNIT) CAPS capsule Take 1 capsule (50,000 Units total) by mouth every 7 (seven) days.     Allergies:   Patient has no known allergies.   ROS:   Please see the history of present illness.   All other systems reviewed and are negative.   Labs/Other Tests and Data Reviewed:    Recent Labs: 09/29/2019: ALT 21; BUN 15; Creat 0.74; Hemoglobin 16.0; Platelets 329; Potassium 4.5; Sodium 138; TSH 2.99   Recent Lipid Panel Lab Results  Component Value Date/Time   CHOL 189 09/29/2019 11:05 AM   TRIG 126 09/29/2019 11:05 AM   HDL 43 (L) 09/29/2019 11:05 AM   CHOLHDL 4.4 09/29/2019 11:05 AM   LDLCALC 122 (H) 09/29/2019 11:05 AM    Wt Readings from Last 3 Encounters:  11/22/19 212 lb (96.2 kg)  10/27/19 212 lb (96.2 kg)  09/29/19 212 lb (96.2 kg)     Objective:    Vital Signs:  BP 118/62   Ht 5\' 3"  (1.6 m)   Wt 212 lb (96.2 kg)   LMP 04/11/2012   BMI 37.55 kg/m    VITAL SIGNS:  reviewed GEN:  Alert and oriented RESPIRATORY:  No shortness of breath noted in conversation PSYCH:  Normal affect and mood  ASSESSMENT & PLAN:    1. Other migraine without status migrainosus, not intractable  - amitriptyline (ELAVIL) 50 MG  tablet; Take 1 tablet (50 mg total) by mouth at bedtime.  Dispense: 30 tablet; Refill: 2 - rizatriptan (MAXALT) 5 MG tablet; Take 1 tablet (5 mg total) by mouth as needed for migraine. May repeat in 2 hours if needed  Dispense: 10 tablet; Refill: 0  2. Encounter for smoking cessation counseling  - varenicline (CHANTIX) 1 MG tablet; Take 1 tablet (1 mg total) by mouth 2 (two) times daily.  Dispense: 60 tablet; Refill: 0  3. Dysuria     Time:   Today, I have spent 10 minutes with the patient with telehealth technology discussing the above problems.     Medication Adjustments/Labs and Tests Ordered: Current medicines are reviewed at length with the patient today.  Concerns regarding medicines are outlined above.   Tests Ordered: No orders of the defined  types were placed in this encounter.   Medication Changes: No orders of the defined types were placed in this encounter.   Disposition:  Follow up 8 weeks Signed, Perlie Mayo, NP  11/22/2019 11:35 AM     [facility]

## 2019-11-24 ENCOUNTER — Other Ambulatory Visit: Payer: Self-pay | Admitting: Family Medicine

## 2019-11-24 DIAGNOSIS — N3000 Acute cystitis without hematuria: Secondary | ICD-10-CM

## 2019-11-24 MED ORDER — NITROFURANTOIN MONOHYD MACRO 100 MG PO CAPS: 100.0000 mg | ORAL_CAPSULE | Freq: Two times a day (BID) | ORAL | 0 refills | Status: AC

## 2019-11-25 LAB — URINE CULTURE: Culture: >=100000 — AB

## 2019-12-11 ENCOUNTER — Other Ambulatory Visit: Payer: Self-pay | Admitting: *Deleted

## 2019-12-11 ENCOUNTER — Other Ambulatory Visit: Payer: Self-pay | Admitting: Family Medicine

## 2019-12-11 ENCOUNTER — Encounter: Payer: Self-pay | Admitting: Family Medicine

## 2019-12-11 DIAGNOSIS — G43809 Other migraine, not intractable, without status migrainosus: Secondary | ICD-10-CM

## 2019-12-11 MED ORDER — RIZATRIPTAN BENZOATE 5 MG PO TABS: 5.0000 mg | ORAL_TABLET | ORAL | 0 refills | Status: DC | PRN

## 2019-12-18 ENCOUNTER — Other Ambulatory Visit (HOSPITAL_COMMUNITY)
Admission: RE | Admit: 2019-12-18 | Discharge: 2019-12-18 | Disposition: A | Discharge status: Home / Self Care | Payer: Medicaid Other | Source: Ambulatory Visit | Attending: Adult Health | Admitting: Adult Health

## 2019-12-18 ENCOUNTER — Ambulatory Visit: Payer: Medicaid Other | Admitting: Adult Health

## 2019-12-18 ENCOUNTER — Encounter: Payer: Self-pay | Admitting: Adult Health

## 2019-12-18 VITALS — BP 133/79 | HR 109 | Ht 63.0 in | Wt 209.0 lb

## 2019-12-18 DIAGNOSIS — Z1211 Encounter for screening for malignant neoplasm of colon: Secondary | ICD-10-CM | POA: Diagnosis not present

## 2019-12-18 DIAGNOSIS — Z8541 Personal history of malignant neoplasm of cervix uteri: Secondary | ICD-10-CM

## 2019-12-18 DIAGNOSIS — Z08 Encounter for follow-up examination after completed treatment for malignant neoplasm: Secondary | ICD-10-CM

## 2019-12-18 DIAGNOSIS — Z90710 Acquired absence of both cervix and uterus: Secondary | ICD-10-CM | POA: Insufficient documentation

## 2019-12-18 DIAGNOSIS — R1031 Right lower quadrant pain: Secondary | ICD-10-CM

## 2019-12-18 DIAGNOSIS — Z1212 Encounter for screening for malignant neoplasm of rectum: Secondary | ICD-10-CM | POA: Diagnosis not present

## 2019-12-18 DIAGNOSIS — B379 Candidiasis, unspecified: Secondary | ICD-10-CM | POA: Diagnosis not present

## 2019-12-18 DIAGNOSIS — N898 Other specified noninflammatory disorders of vagina: Secondary | ICD-10-CM | POA: Diagnosis not present

## 2019-12-18 HISTORY — DX: Encounter for screening for malignant neoplasm of colon: Z12.11

## 2019-12-18 LAB — HEMOCCULT GUIAC POC 1CARD (OFFICE): Fecal Occult Blood, POC: NEGATIVE

## 2019-12-18 LAB — POCT WET PREP (WET MOUNT)

## 2019-12-18 MED ORDER — FLUCONAZOLE 150 MG PO TABS: ORAL_TABLET | ORAL | 1 refills | Status: DC

## 2019-12-18 NOTE — Progress Notes (Signed)
Patient ID: April Ayala, female   DOB: 1976/06/13, 44 y.o.   MRN: [id number] History of Present Illness: April Ayala is a 44 year old white female, separated, sp hysterectomy for cervical cancer in 2013, in for pap and pelvic. She works at [employer].  PCP is April Beach NP.    Current Medications, Allergies, Past Medical History, Past Surgical History, Family History and Social History were reviewed in [employer] [employer] record.     Review of Systems: Patient denies any headaches, hearing loss, fatigue, blurred vision, shortness of breath, chest pain,  problems with bowel movements, urination, or intercourse. No joint pain or mood swings. +RLQ hx cyst, used OCs in the past +vaginal discharge,it is white  Physical Exam:BP 133/79 (BP Location: Left Arm, Patient Position: Sitting, Cuff Size: Normal)   Pulse (!) 109   Ht 5\' 3"  (1.6 m)   Wt 209 lb (94.8 kg)   LMP 04/11/2012   BMI 37.02 kg/m  General:  Well developed, well nourished, no acute distress Skin:  Warm and dry Neck:  Midline trachea, normal thyroid, good ROM, no lymphadenopathy Lungs; Clear to auscultation bilaterally Cardiovascular: Regular rate and rhythm Pelvic:  External genitalia is normal in appearance, no lesions.  The vagina is normal in appearance. Urethra has no lesions or masses. The cervix is bulbous.  Uterus is felt to be normal size, shape, and contour.  No adnexal masses or tenderness noted.Bladder is non tender, no masses felt. Rectal: Good sphincter tone, no polyps, or hemorrhoids felt.  Hemoccult negative. Psych:  No mood changes, alert and cooperative,seems happy AA 1 Fall risk is low PHQ 9 score is 5, no SI,has meds Examination chaperoned by April Squibb LPN  Impression and Plan: 1. Vaginal Pap smear following hysterectomy for malignancy Pap sent Physical with PCP Pap in 1 year Mammogram yearly Labs with PCP   2. Screening for colorectal cancer -colonoscopy at 44   3. History of  cervical cancer   4. Vaginal discharge  5. Yeast infection Will rx diflucan Meds ordered this encounter  Medications  . fluconazole (DIFLUCAN) 150 MG tablet    Sig: Take 1 now and repeat 1 in 3 days    Dispense:  2 tablet    Refill:  1    Order Specific Question:   Supervising Provider    Answer:   Ayala, April H [[id number]]    6. RLQ abdominal pain Return in about 2 weeks for GYN [place] to assess

## 2019-12-21 LAB — CYTOLOGY - PAP
Comment: NEGATIVE
Diagnosis: NEGATIVE
High risk HPV: NEGATIVE

## 2019-12-23 ENCOUNTER — Encounter: Payer: Self-pay | Admitting: Family Medicine

## 2019-12-25 ENCOUNTER — Other Ambulatory Visit: Payer: Self-pay | Admitting: *Deleted

## 2019-12-25 DIAGNOSIS — J3089 Other allergic rhinitis: Secondary | ICD-10-CM

## 2019-12-25 MED ORDER — LEVOCETIRIZINE DIHYDROCHLORIDE 5 MG PO TABS: 5.0000 mg | ORAL_TABLET | Freq: Every evening | ORAL | 1 refills | Status: DC

## 2019-12-26 ENCOUNTER — Encounter: Payer: Self-pay | Admitting: Family Medicine

## 2019-12-27 ENCOUNTER — Other Ambulatory Visit: Payer: Self-pay

## 2019-12-27 ENCOUNTER — Encounter: Payer: Self-pay | Admitting: Family Medicine

## 2019-12-27 ENCOUNTER — Telehealth (INDEPENDENT_AMBULATORY_CARE_PROVIDER_SITE_OTHER): Payer: Medicaid Other | Admitting: Family Medicine

## 2019-12-27 VITALS — BP 133/79 | Ht 63.0 in | Wt 212.0 lb

## 2019-12-27 DIAGNOSIS — E669 Obesity, unspecified: Secondary | ICD-10-CM

## 2019-12-27 MED ORDER — PHENTERMINE HCL 37.5 MG PO TABS: 18.7500 mg | ORAL_TABLET | Freq: Every day | ORAL | 0 refills | Status: DC

## 2019-12-27 NOTE — Telephone Encounter (Signed)
She wants a refill of Chantix?

## 2019-12-27 NOTE — Patient Instructions (Signed)
I appreciate the opportunity to provide you with care for your health and wellness. Today we discussed: weight loss needs  Follow up: 3.5 weeks in office for weight check  No labs or referrals today  Start Adipex in the morning. Please drink plenty of water. Remember that diet and exercise are the better options for weight loss success.  Please continue to practice social distancing to keep you, your family, and our community safe.  If you must go out, please wear a mask and practice good handwashing.  It was a pleasure to see you and I look forward to continuing to work together on your health and well-being. Please do not hesitate to call the office if you need care or have questions about your care.  Have a wonderful day and week. With Gratitude, Cherly Beach, DNP, AGNP-BC

## 2019-12-27 NOTE — Assessment & Plan Note (Signed)
Desire for weight loss.  We will try Adipex.  Topamax did not work for migraine headache.  So therefore that was not an option.  Reports that she has stopped smoking and has made her more hungry.  I have educated her on diet control lifestyle changes and exercise.  We will follow-up in 3-1/2 weeks in office to see how she is doing with weight loss plan.  Reviewed side effects, risks and benefits of medication.  Patient acknowledged agreement and understanding of the plan.

## 2019-12-27 NOTE — Progress Notes (Signed)
Virtual Visit via Telephone Note   This visit type was conducted due to national recommendations for restrictions regarding the COVID-19 Pandemic (e.g. social distancing) in an effort to limit this patient's exposure and mitigate transmission in our community.  Due to her co-morbid illnesses, this patient is at least at moderate risk for complications without adequate follow up.  This format is felt to be most appropriate for this patient at this time.  The patient did not have access to video technology/had technical difficulties with video requiring transitioning to audio format only (telephone).  All issues noted in this document were discussed and addressed.  No physical exam could be performed with this format.    Evaluation Performed:  Follow-up visit  Date:  12/27/2019   ID:  April Ayala, DOB 20-Jun-1976, MRN [id number]  Patient Location: Home Provider Location: Office  Location of Patient: Home Location of Provider: Telehealth Consent was obtain for visit to be over via telehealth. I verified that I am speaking with the correct person using two identifiers.  PCP:  Perlie Mayo, NP   Chief Complaint:  Weight loss management   History of Present Illness:    April Ayala is a 44 y.o. female with history of obesity, cervical cancer, tension headaches and migraines among others.  Reports today for help on weight loss management.  Secondary to headaches we have tried Topamax earlier in the year.  But was unable to have success with this.  She reports that her headaches are much better controlled on the amitriptyline which we will continue at this time.  She is willing to try low-dose Adipex to see if that is helpful for her.  She does realize that medication is not the number one option and that lifestyle, diet and exercise are much more strongly recommended.  She denies having any chest pain, shortness of breath, cough, headaches, dizziness, vision changes or any other  signs or symptoms of uncontrolled blood pressure and/or infection.  The patient does not have symptoms concerning for COVID-19 infection (fever, chills, cough, or new shortness of breath).   Past Medical, Surgical, Social History, Allergies, and Medications have been Reviewed.  Past Medical History:  Diagnosis Date  . Allergy   . Cervical cancer ([place]) 04/2011   Stage 1B squamous cell  . Chronic left shoulder pain 10/07/2017  . Chronic tension-type headache, not intractable 10/07/2017  . History of kidney stones    passed  . S/P lumbar fusion 04/20/2018   Past Surgical History:  Procedure Laterality Date  . ABDOMINAL HYSTERECTOMY    . BREAST BIOPSY Right   . CERVICAL CONIZATION W/BX  03/24/2012   Procedure: CONIZATION CERVIX WITH BIOPSY;  Surgeon: Woodroe Mode, MD;  Location: [facility];  Service: Gynecology;  Laterality: N/A;  . DIAGNOSTIC LAPAROSCOPY  2007   ectopic  . ECTOPIC PREGNANCY SURGERY    . ESSURE TUBAL LIGATION  2010  . INSERTION OF SUPRAPUBIC CATHETER  05/03/2012   Procedure: INSERTION OF SUPRAPUBIC CATHETER;  Surgeon: Alvino Chapel, MD;  Location: [facility];  Service: Gynecology;;  . LEEP    . LUMBAR LAMINECTOMY/DECOMPRESSION MICRODISCECTOMY N/A 04/20/2018   Procedure: LUMBAR FIVE TO SACRAL ONE DECOMPRESSION AND FUSION, POSSIBLE LUMBAR FOUR TO SACRAL ONE; SCREW AND CAGE INSTRUMENTATION;  Surgeon: Melina Schools, MD;  Location: [place];  Service: Orthopedics;  Laterality: N/A;  5 hrs  . LYMPHADENECTOMY  05/03/2012   Procedure: LYMPHADENECTOMY;  Surgeon: Alvino Chapel, MD;  Location: [facility];  Service: Gynecology;  Laterality: Bilateral;  pelvic  . RADICAL HYSTERECTOMY  05/03/2012   Procedure: RADICAL HYSTERECTOMY;  Surgeon: Alvino Chapel, MD;  Location: [facility];  Service: Gynecology;;  transposition of the ovaries  . SPINE SURGERY    . TUBAL LIGATION       Current Meds  Medication Sig  . amitriptyline (ELAVIL) 50 MG tablet Take 1 tablet (50 mg  total) by mouth at bedtime.  . cyclobenzaprine (FLEXERIL) 10 MG tablet cyclobenzaprine 10 mg tablet  Take 1 tablet 3 times a day by oral route as directed.  . fluconazole (DIFLUCAN) 150 MG tablet Take 1 now and repeat 1 in 3 days  . levocetirizine (XYZAL) 5 MG tablet Take 1 tablet (5 mg total) by mouth every evening.  Marland Kitchen oxyCODONE-acetaminophen (PERCOCET) 10-325 MG tablet oxycodone-acetaminophen 10 mg-325 mg tablet  Take 1 tablet 4 times a day by oral route as needed.  . pregabalin (LYRICA) 75 MG capsule pregabalin 75 mg capsule  TAKE 1 CAPSULE BY MOUTH TWICE DAILY  . rizatriptan (MAXALT) 5 MG tablet Take 1 tablet (5 mg total) by mouth as needed for migraine. May repeat in 2 hours if needed  . rosuvastatin (CRESTOR) 5 MG tablet Take 1 tablet (5 mg total) by mouth daily.  . varenicline (CHANTIX) 1 MG tablet Take 1 tablet (1 mg total) by mouth 2 (two) times daily.  . Vitamin D, Ergocalciferol, (DRISDOL) 1.25 MG (50000 UNIT) CAPS capsule Take 1 capsule (50,000 Units total) by mouth every 7 (seven) days.     Allergies:   Patient has no known allergies.   ROS:   Please see the history of present illness.    All other systems reviewed and are negative.   Labs/Other Tests and Data Reviewed:    Recent Labs: 09/29/2019: ALT 21; BUN 15; Creat 0.74; Hemoglobin 16.0; Platelets 329; Potassium 4.5; Sodium 138; TSH 2.99   Recent Lipid Panel Lab Results  Component Value Date/Time   CHOL 189 09/29/2019 11:05 AM   TRIG 126 09/29/2019 11:05 AM   HDL 43 (L) 09/29/2019 11:05 AM   CHOLHDL 4.4 09/29/2019 11:05 AM   LDLCALC 122 (H) 09/29/2019 11:05 AM    Wt Readings from Last 3 Encounters:  12/27/19 212 lb (96.2 kg)  12/18/19 209 lb (94.8 kg)  11/22/19 212 lb (96.2 kg)     Objective:    Vital Signs:  BP 133/79   Ht 5\' 3"  (1.6 m)   Wt 212 lb (96.2 kg)   LMP 04/11/2012   BMI 37.55 kg/m    VITAL SIGNS:  reviewed GEN:  alert and oriented  RESPIRATORY:  no shortness of breath in conversation    PSYCH:  normal affect and mood   ASSESSMENT & PLAN:    1. Obesity (BMI 30-39.9) - phentermine (ADIPEX-P) 37.5 MG tablet; Take 0.5 tablets (18.75 mg total) by mouth daily before breakfast.  Dispense: 15 tablet; Refill: 0   Time:   Today, I have spent 10 minutes with the patient with telehealth technology discussing the above problems.     Medication Adjustments/Labs and Tests Ordered: Current medicines are reviewed at length with the patient today.  Concerns regarding medicines are outlined above.   Tests Ordered: No orders of the defined types were placed in this encounter.   Medication Changes: No orders of the defined types were placed in this encounter.   Disposition:  Follow up 3.5 weeks  Signed, Perlie Mayo, NP  12/27/2019 3:05 PM     Linna Hoff  [facility]

## 2019-12-28 ENCOUNTER — Encounter: Payer: Self-pay | Admitting: Family Medicine

## 2019-12-28 NOTE — Telephone Encounter (Signed)
Pt advised to go to the urgent care or ER to be evaluated with verbal understanding.

## 2020-01-01 ENCOUNTER — Encounter: Payer: Self-pay | Admitting: Family Medicine

## 2020-01-02 ENCOUNTER — Other Ambulatory Visit: Payer: Self-pay | Admitting: Family Medicine

## 2020-01-02 DIAGNOSIS — G43809 Other migraine, not intractable, without status migrainosus: Secondary | ICD-10-CM

## 2020-01-02 DIAGNOSIS — F419 Anxiety disorder, unspecified: Secondary | ICD-10-CM

## 2020-01-02 MED ORDER — BUSPIRONE HCL 5 MG PO TABS: 5.0000 mg | ORAL_TABLET | Freq: Two times a day (BID) | ORAL | 1 refills | Status: DC

## 2020-01-02 MED ORDER — AMITRIPTYLINE HCL 75 MG PO TABS: 75.0000 mg | ORAL_TABLET | Freq: Every day | ORAL | 1 refills | Status: DC

## 2020-01-02 NOTE — Telephone Encounter (Signed)
Pt is wondering if their is anything she can take for nerves. What would you recommend?

## 2020-01-03 ENCOUNTER — Other Ambulatory Visit: Payer: Medicaid Other

## 2020-01-04 ENCOUNTER — Encounter: Payer: Self-pay | Admitting: Neurology

## 2020-01-04 ENCOUNTER — Ambulatory Visit: Payer: Medicaid Other | Admitting: Neurology

## 2020-01-04 VITALS — BP 105/77 | HR 97 | Ht 63.0 in | Wt 206.0 lb

## 2020-01-04 DIAGNOSIS — R519 Headache, unspecified: Secondary | ICD-10-CM

## 2020-01-04 DIAGNOSIS — G5603 Carpal tunnel syndrome, bilateral upper limbs: Secondary | ICD-10-CM

## 2020-01-04 DIAGNOSIS — H539 Unspecified visual disturbance: Secondary | ICD-10-CM | POA: Diagnosis not present

## 2020-01-04 DIAGNOSIS — G43709 Chronic migraine without aura, not intractable, without status migrainosus: Secondary | ICD-10-CM | POA: Diagnosis not present

## 2020-01-04 DIAGNOSIS — R51 Headache with orthostatic component, not elsewhere classified: Secondary | ICD-10-CM | POA: Diagnosis not present

## 2020-01-04 MED ORDER — EMGALITY 120 MG/ML ~~LOC~~ SOAJ: 120.0000 mg | SUBCUTANEOUS | 11 refills | Status: DC

## 2020-01-04 MED ORDER — RIZATRIPTAN BENZOATE 10 MG PO TBDP
10.0000 mg | ORAL_TABLET | ORAL | 11 refills | Status: DC | PRN
Start: 2020-01-04 — End: 2020-08-07

## 2020-01-04 NOTE — Patient Instructions (Addendum)
[facility]  Rizatriptan: Please take one tablet at the onset of your headache. If it does not improve the symptoms please take one additional tablet. Do not take more then 2 tablets in 24hrs. Do not take use more then 2 to 3 times in a week.  Start: [employer] injection What is this medicine? GALCANEZUMAB (gal ka NEZ ue mab) is used to prevent migraines and treat cluster headaches. This medicine may be used for other purposes; ask your health care provider or pharmacist if you have questions. COMMON BRAND NAME(S): Emgality What should I tell my health care provider before I take this medicine? They need to know if you have any of these conditions:  an unusual or allergic reaction to galcanezumab, other medicines, foods, dyes, or preservatives  pregnant or trying to get pregnant  breast-feeding How should I use this medicine? This medicine is for injection under the skin. You will be taught how to prepare and give this medicine. Use exactly as directed. Take your medicine at regular intervals. Do not take your medicine more often than directed. It is important that you put your used needles and syringes in a special sharps container. Do not put them in a trash can. If you do not have a sharps container, call your pharmacist or healthcare provider to get one. Talk to your pediatrician regarding the use of this medicine in children. Special care may be needed. Overdosage: If you think you have taken too much of this medicine contact a poison control center or emergency room at once. NOTE: This medicine is only for you. Do not share this medicine with others. What if I miss a dose? If you miss a dose, take it as soon as you can. If it is almost time for your next dose, take only that dose. Do not take double or extra doses. What may interact with this medicine? Interactions are not expected. This list may not describe all possible interactions. Give your health care provider  a list of all the medicines, herbs, non-prescription drugs, or dietary supplements you use. Also tell them if you smoke, drink alcohol, or use illegal drugs. Some items may interact with your medicine. What should I watch for while using this medicine? Tell your doctor or healthcare professional if your symptoms do not start to get better or if they get worse. What side effects may I notice from receiving this medicine? Side effects that you should report to your doctor or health care professional as soon as possible:  allergic reactions like skin rash, itching or hives, swelling of the face, lips, or tongue Side effects that usually do not require medical attention (report these to your doctor or health care professional if they continue or are bothersome):  pain, redness, or irritation at site where injected This list may not describe all possible side effects. Call your doctor for medical advice about side effects. You may report side effects to FDA at 1-800-FDA-1088. Where should I keep my medicine? Keep out of the reach of children. You will be instructed on how to store this medicine. Throw away any unused medicine after the expiration date on the label. NOTE: This sheet is a summary. It may not cover all possible information. If you have questions about this medicine, talk to your doctor, pharmacist, or health care provider.  2020 [employer] Standard (2017-12-08 12:03:23)   Rizatriptan disintegrating tablets What is this medicine? RIZATRIPTAN (rye za TRIP tan) is used to treat migraines with  or without aura. An aura is a strange feeling or visual disturbance that warns you of an attack. It is not used to prevent migraines. This medicine may be used for other purposes; ask your health care provider or pharmacist if you have questions. COMMON BRAND NAME(S): Maxalt-MLT What should I tell my health care provider before I take this medicine? They need to know if you have any of these  conditions:  cigarette smoker  circulation problems in fingers and toes  diabetes  heart disease  high blood pressure  high cholesterol  history of irregular heartbeat  history of stroke  kidney disease  liver disease  stomach or intestine problems  an unusual or allergic reaction to rizatriptan, other medicines, foods, dyes, or preservatives  pregnant or trying to get pregnant  breast-feeding How should I use this medicine? Take this medicine by mouth. Follow the directions on the prescription label. Leave the tablet in the sealed blister pack until you are ready to take it. With dry hands, open the blister and gently remove the tablet. If the tablet breaks or crumbles, throw it away and take a new tablet out of the blister pack. Place the tablet in the mouth and allow it to dissolve, and then swallow. Do not cut, crush, or chew this medicine. You do not need water to take this medicine. Do not take it more often than directed. Talk to your pediatrician regarding the use of this medicine in children. While this drug may be prescribed for children as young as 6 years for selected conditions, precautions do apply. Overdosage: If you think you have taken too much of this medicine contact a poison control center or emergency room at once. NOTE: This medicine is only for you. Do not share this medicine with others. What if I miss a dose? This does not apply. This medicine is not for regular use. What may interact with this medicine? Do not take this medicine with any of the following medicines:  certain medicines for migraine headache like almotriptan, eletriptan, frovatriptan, naratriptan, rizatriptan, sumatriptan, zolmitriptan  ergot alkaloids like dihydroergotamine, ergonovine, ergotamine, methylergonovine  MAOIs like Carbex, Eldepryl, Marplan, Nardil, and Parnate This medicine may also interact with the following medications:  certain medicines for depression, anxiety,  or psychotic disorders  propranolol This list may not describe all possible interactions. Give your health care provider a list of all the medicines, herbs, non-prescription drugs, or dietary supplements you use. Also tell them if you smoke, drink alcohol, or use illegal drugs. Some items may interact with your medicine. What should I watch for while using this medicine? Visit your healthcare professional for regular checks on your progress. Tell your healthcare professional if your symptoms do not start to get better or if they get worse. You may get drowsy or dizzy. Do not drive, use machinery, or do anything that needs mental alertness until you know how this medicine affects you. Do not stand up or sit up quickly, especially if you are an older patient. This reduces the risk of dizzy or fainting spells. Alcohol may interfere with the effect of this medicine. Your mouth may get dry. Chewing sugarless gum or sucking hard candy and drinking plenty of water may help. Contact your healthcare professional if the problem does not go away or is severe. If you take migraine medicines for 10 or more days a month, your migraines may get worse. Keep a diary of headache days and medicine use. Contact your healthcare professional if your  migraine attacks occur more frequently. What side effects may I notice from receiving this medicine? Side effects that you should report to your doctor or health care professional as soon as possible:  allergic reactions like skin rash, itching or hives, swelling of the face, lips, or tongue  chest pain or chest tightness  signs and symptoms of a dangerous change in heartbeat or heart rhythm like chest pain; dizziness; fast, irregular heartbeat; palpitations; feeling faint or lightheaded; falls; breathing problems  signs and symptoms of a stroke like changes in vision; confusion; trouble speaking or understanding; severe headaches; sudden numbness or weakness of the face,  arm or leg; trouble walking; dizziness; loss of balance or coordination  signs and symptoms of serotonin syndrome like irritable; confusion; diarrhea; fast or irregular heartbeat; muscle twitching; stiff muscles; trouble walking; sweating; high fever; seizures; chills; vomiting Side effects that usually do not require medical attention (report to your doctor or health care professional if they continue or are bothersome):  diarrhea  dizziness  drowsiness  dry mouth  headache  nausea, vomiting  pain, tingling, numbness in the hands or feet  stomach pain This list may not describe all possible side effects. Call your doctor for medical advice about side effects. You may report side effects to FDA at 1-800-FDA-1088. Where should I keep my medicine? Keep out of the reach of children. Store at room temperature between 15 and 30 degrees C (59 and 86 degrees F). Protect from light and moisture. Throw away any unused medicine after the expiration date. NOTE: This sheet is a summary. It may not cover all possible information. If you have questions about this medicine, talk to your doctor, pharmacist, or health care provider.  2020 [employer] Standard (2018-01-04 14:58:08)

## 2020-01-04 NOTE — Progress Notes (Addendum)
[facility]    Provider:  Dr Jaynee Eagles Requesting Provider: Perlie Mayo, NP Primary Care Provider:  Perlie Mayo, NP  CC:  CTS and headaches  HPI:  April Ayala is a 44 y.o. female here as requested by Perlie Mayo, NP for Carpal Tunnel and Migraines, past medical history of lumbar fusion, kidney stones, chronic headaches, chronic left shoulder pain, migraines, carpal tunnel bilaterally, fatigue, tobacco use, obesity.  I reviewed Cherly Beach notes, she has been managed for her migraines, she was tried on Topamax with little success, she is on amitriptyline now which has helped, she is in an active phase of weight loss.   Patient is here alone: She has had migraines for 4 years, all her sisters have migraine, no inciting event, stress can trigger and make them worse, she wakes up with headaches every day, she has CTS and her hands hurt, numbness and tingling, she had an emg and she was supposed to have surgery, she deny any weakness or snoring, no excessive daytime sleepiness. She has daily headaches for over a year, she has sharp pains behind the left head, headaches are pounding/pulsating, she has to go into a dark room, sound bothers her and makes it wore, amitriptyline helps, she has had a hysterectomy, she takes percocet 3x a day and so take a laxative but has no constipation due to treatment, nausea and vomiting. The headaches are worse when laying down and she can barely get to sleep. No aura. She Is still smoking.No other focal neurologic deficits, associated symptoms, inciting events or modifiable factors.  09/2019: Hgba1c,tsh,cmp normal reviewed  Reviewed notes, labs and imaging from outside physicians, which showed: reviewed EMG/NCS report from 2019, data and agree with the following:  From a thorough review of records, medications tried that can be used in migraine management include : Topamax, amitriptyline, flexeril, lyrica, maxalt, tylenol, baclofen,  fioricet, neurontin, indomethacin, ibuprofen, toradol, robaxin, meoprolol, naproxen, zofran, phenergan, maxalt, trmadol   I also reviewed EMG nerve conduction study data and results and agree with the following from 2019 Dr. Magnus Sinning: The above electrodiagnostic study is ABNORMAL and reveals evidence of:  1.  A moderate to severe RIGHT median nerve entrapment at the wrist (carpal tunnel syndrome) affecting sensory and motor components.   2.  A severe LEFT median nerve entrapment at the wrist (carpal tunnel syndrome) affecting sensory and motor components. The lesion is characterized by sensory and motor demyelination with evidence of axonal injury.    Review of Systems: Patient complains of symptoms per HPI as well as the following symptoms: Carpal Tunnel and Migraines, numbness, tingling, headaches. Pertinent negatives and positives per HPI. All others negative.   Social History   Socioeconomic History  . Marital status: Legally Separated    Spouse name: Not on file  . Number of children: 4  . Years of education: Not on file  . Highest education level: 12th grade  Occupational History  . Occupation: [employer]  Tobacco Use  . Smoking status: Former Smoker    Packs/day: 0.50    Years: 1.00    Pack years: 0.50    Types: Cigarettes    Start date: 11/03/2018  . Smokeless tobacco: Never Used  . Tobacco comment: smoking cessation information given  Vaping Use  . Vaping Use: Never used  Substance and Sexual Activity  . Alcohol use: Never  . Drug use: No  . Sexual activity: Not Currently    Birth control/protection: Surgical  Comment: hyst  Other Topics Concern  . Not on file  Social History Narrative   Lives with fiance and 1 daughter      1 son is with grandmother    Oldest daughter is 43 lives with grandfather      Works as a Aeronautical engineer at [employer].     Lives in [place], [place].      6 dogs, a  beard dragon, a snake, and a hamster and a cat      Enjoys:  taking care of her pets      Diet: avoids lunch due to work, eats all food groups   Caffeine: monsters in the [employer]: limited      Wears seat belt    Does not use phone while driving    Martinez at home       Right handed   Social Determinants of Health   Financial Resource Strain: [place]   . Difficulty of Paying Living Expenses: Not very hard  Food Insecurity: No Food Insecurity  . Worried About Charity fundraiser in the Last Year: Never true  . Ran Out of Food in the Last Year: Never true  Transportation Needs: No Transportation Needs  . Lack of Transportation (Medical): No  . Lack of Transportation (Non-Medical): No  Physical Activity: Insufficiently Active  . Days of Exercise per Week: 3 days  . Minutes of Exercise per Session: 30 min  Stress: No Stress Concern Present  . Feeling of Stress : Not at all  Social Connections: Moderately Isolated  . Frequency of Communication with Friends and Family: Never  . Frequency of Social Gatherings with Friends and Family: Never  . Attends Religious Services: 1 to 4 times per year  . Active Member of Clubs or Organizations: No  . Attends Archivist Meetings: Never  . Marital Status: Living with partner  Intimate Partner Violence: Not At Risk  . Fear of Current or Ex-Partner: No  . Emotionally Abused: No  . Physically Abused: No  . Sexually Abused: No    Family History  Problem Relation Age of Onset  . Diabetes Mother   . Hypertension Father   . Diabetes Sister   . Migraines Sister   . Hypothyroidism Sister   . Migraines Sister   . Migraines Sister     Past Medical History:  Diagnosis Date  . Allergy   . Cervical cancer ([place]) 04/2011   Stage 1B squamous cell  . Chronic left shoulder pain 10/07/2017  . Chronic tension-type headache, not intractable 10/07/2017  . History of kidney stones    passed  . S/P lumbar fusion 04/20/2018    Patient Active Problem List   Diagnosis Date Noted    . Vaginal Pap smear following hysterectomy for malignancy 12/18/2019  . Screening for colorectal cancer 12/18/2019  . Vaginal discharge 12/18/2019  . Yeast infection 12/18/2019  . RLQ abdominal pain 12/18/2019  . Dysuria 11/22/2019  . History of cervical cancer 10/27/2019  . Environmental and seasonal allergies 09/29/2019  . Encounter for smoking cessation counseling 09/29/2019  . Binge eating 09/29/2019  . Migraine 09/29/2019  . Bilateral carpal tunnel syndrome 09/29/2019  . Encounter for screening mammogram for malignant neoplasm of breast 09/29/2019  . Obesity (BMI 30-39.9) 09/29/2019  . Family history of hyperlipidemia 09/29/2019  . Family history of diabetes mellitus 09/29/2019  . Family history of hypothyroidism 09/29/2019  . Other fatigue 09/29/2019  . Vitamin D deficiency 09/29/2019  Past Surgical History:  Procedure Laterality Date  . ABDOMINAL HYSTERECTOMY    . BREAST BIOPSY Right   . CERVICAL CONIZATION W/BX  03/24/2012   Procedure: CONIZATION CERVIX WITH BIOPSY;  Surgeon: Woodroe Mode, MD;  Location: [facility];  Service: Gynecology;  Laterality: N/A;  . DIAGNOSTIC LAPAROSCOPY  2007   ectopic  . ECTOPIC PREGNANCY SURGERY    . ESSURE TUBAL LIGATION  2010  . INSERTION OF SUPRAPUBIC CATHETER  05/03/2012   Procedure: INSERTION OF SUPRAPUBIC CATHETER;  Surgeon: Alvino Chapel, MD;  Location: [facility];  Service: Gynecology;;  . LEEP    . LUMBAR LAMINECTOMY/DECOMPRESSION MICRODISCECTOMY N/A 04/20/2018   Procedure: LUMBAR FIVE TO SACRAL ONE DECOMPRESSION AND FUSION, POSSIBLE LUMBAR FOUR TO SACRAL ONE; SCREW AND CAGE INSTRUMENTATION;  Surgeon: Melina Schools, MD;  Location: [facility];  Service: Orthopedics;  Laterality: N/A;  5 hrs  . LYMPHADENECTOMY  05/03/2012   Procedure: LYMPHADENECTOMY;  Surgeon: Alvino Chapel, MD;  Location: [facility];  Service: Gynecology;  Laterality: Bilateral;  pelvic  . RADICAL HYSTERECTOMY  05/03/2012   Procedure: RADICAL HYSTERECTOMY;   Surgeon: Alvino Chapel, MD;  Location: [facility];  Service: Gynecology;;  transposition of the ovaries  . SPINE SURGERY    . TUBAL LIGATION      Current Outpatient Medications  Medication Sig Dispense Refill  . amitriptyline (ELAVIL) 75 MG tablet Take 1 tablet (75 mg total) by mouth at bedtime. 30 tablet 1  . busPIRone (BUSPAR) 5 MG tablet Take 1 tablet (5 mg total) by mouth 2 (two) times daily. 60 tablet 1  . cyclobenzaprine (FLEXERIL) 10 MG tablet cyclobenzaprine 10 mg tablet  Take 1 tablet 3 times a day by oral route as directed.    . fluconazole (DIFLUCAN) 150 MG tablet Take 1 now and repeat 1 in 3 days 2 tablet 1  . levocetirizine (XYZAL) 5 MG tablet Take 1 tablet (5 mg total) by mouth every evening. 30 tablet 1  . oxyCODONE-acetaminophen (PERCOCET) 10-325 MG tablet oxycodone-acetaminophen 10 mg-325 mg tablet  Take 1 tablet 4 times a day by oral route as needed.    . phentermine (ADIPEX-P) 37.5 MG tablet Take 0.5 tablets (18.75 mg total) by mouth daily before breakfast. 15 tablet 0  . pregabalin (LYRICA) 75 MG capsule pregabalin 75 mg capsule  TAKE 1 CAPSULE BY MOUTH TWICE DAILY    . rosuvastatin (CRESTOR) 5 MG tablet Take 1 tablet (5 mg total) by mouth daily. 30 tablet 1  . varenicline (CHANTIX) 1 MG tablet Take 1 tablet (1 mg total) by mouth 2 (two) times daily. 60 tablet 0  . Vitamin D, Ergocalciferol, (DRISDOL) 1.25 MG (50000 UNIT) CAPS capsule Take 1 capsule (50,000 Units total) by mouth every 7 (seven) days. 4 capsule 3  . Galcanezumab-gnlm (EMGALITY) 120 MG/ML SOAJ Inject 120 mg into the skin every 30 (thirty) days. 1 pen 11  . rizatriptan (MAXALT-MLT) 10 MG disintegrating tablet Take 1 tablet (10 mg total) by mouth as needed for migraine. May repeat in 2 hours if needed 9 tablet 11   No current facility-administered medications for this visit.    Allergies as of 01/04/2020  . (No Known Allergies)    Vitals: BP 105/77 (BP Location: Left Arm, Patient Position:  Sitting)   Pulse 97   Ht 5\' 3"  (1.6 m)   Wt 206 lb (93.4 kg)   LMP 04/11/2012   BMI 36.49 kg/m  Last Weight:  Wt Readings from Last 1 Encounters:  01/04/20 206  lb (93.4 kg)   Last Height:   Ht Readings from Last 1 Encounters:  01/04/20 5\' 3"  (1.6 m)     Physical exam: Exam: Gen: NAD, conversant, well nourised, obese, well groomed                     CV: RRR, no MRG. No Carotid Bruits. No peripheral edema, warm, nontender Eyes: Conjunctivae clear without exudates or hemorrhage  Neuro: Detailed Neurologic Exam  Speech:    Speech is normal; fluent and spontaneous with normal comprehension.  Cognition:    The patient is oriented to person, place, and time;     recent and remote memory intact;     language fluent;     normal attention, concentration,     fund of knowledge Cranial Nerves:    The pupils are equal, round, and reactive to light. The fundi are normal and spontaneous venous pulsations are present. Visual fields are full to finger confrontation. Extraocular movements are intact. Trigeminal sensation is intact and the muscles of mastication are normal. The face is symmetric. The palate elevates in the midline. Hearing intact. Voice is normal. Shoulder shrug is normal. The tongue has normal motion without fasciculations.   Coordination:    No dysmetria or ataxia  Gait: Normal native gait  Motor Observation:    No asymmetry, no atrophy, and no involuntary movements noted. Tone:    Normal muscle tone.    Posture:    Posture is normal. normal erect    Strength: Grip weakness, opponens pollicis weakness bilateral, otherwise strength is V/V in the upper and lower limbs.      Sensation: intact to LT     Reflex Exam:  DTR's:    Deep tendon reflexes in the upper and lower extremities are symmetrical bilaterally.   Toes:    The toes are downgoing bilaterally.   Clonus:    Clonus is absent.   Positive Tinel's sign and make Phalen's maneuver at the bilateral  wrists  Assessment/Plan:  44 y.o. female here as requested by Perlie Mayo, NP for Carpal Tunnel and Migraines, past medical history of lumbar fusion, kidney stones, chronic headaches, chronic left shoulder pain, migraines, carpal tunnel bilaterally, fatigue, tobacco use, obesity.  She likely has chronic migraines and carpal tunnel syndrome.  For her carpal tunnel syndrome we will provide referral and see if EMG nerve conduction study is needed.  For her migraines she has failed multiple medications, we had a large discussion, we will start her on CGRP injections, she has had hysterectomy she cannot have children, but given concerning symptoms she should have an MRI of the brain as well.  Addendum: Consulted with Dr. Ernestina Patches: "Really needs to follow-up with Dr. Joni Fears.  She needed surgery in 2019.  At least in my view she does not need repeat electrodiagnostic studies since she had fairly severe carpal tunnel syndrome at the time and that likely has not improved.  Again she should follow-up with Dr. Durward Fortes." Will refer to Dr. Durward Fortes and cancel emg/ncs  Prior:  Bilateral CTS diagnosed with emg/ncs 2019: refer to [facility]. Will schedule another emg/ncs and send referral, if ortho does not require another emg/ncs please cancel  MRI of the brain - she has screws and a cage, will see if she can have MRI: MRI brain due to concerning symptoms of morning headaches, positional headaches, intractable headaches  to look for space occupying mass, chiari or intracranial hypertension (pseudotumor).  Rizatriptan: Please take  one tablet at the onset of your headache. If it does not improve the symptoms please take one additional tablet. Do not take more then 2 tablets in 24hrs. Do not take use more then 2 to 3 times in a week.  Start: Emgality for prevention  Orders Placed This Encounter  Procedures  . MR BRAIN W WO CONTRAST  . Ambulatory referral to Orthopedic Surgery  . NCV with  EMG(electromyography)   Meds ordered this encounter  Medications  . Galcanezumab-gnlm (EMGALITY) 120 MG/ML SOAJ    Sig: Inject 120 mg into the skin every 30 (thirty) days.    Dispense:  1 pen    Refill:  11  . rizatriptan (MAXALT-MLT) 10 MG disintegrating tablet    Sig: Take 1 tablet (10 mg total) by mouth as needed for migraine. May repeat in 2 hours if needed    Dispense:  9 tablet    Refill:  11    Do not fill early, 9 max a month    Discussed: To prevent or relieve headaches, try the following: Cool Compress. Lie down and place a cool compress on your head.  Avoid headache triggers. If certain foods or odors seem to have triggered your migraines in the past, avoid them. A headache diary might help you identify triggers.  Include physical activity in your daily routine. Try a daily walk or other moderate aerobic exercise.  Manage stress. Find healthy ways to cope with the stressors, such as delegating tasks on your to-do list.  Practice relaxation techniques. Try deep breathing, yoga, massage and visualization.  Eat regularly. Eating regularly scheduled meals and maintaining a healthy diet might help prevent headaches. Also, drink plenty of fluids.  Follow a regular sleep schedule. Sleep deprivation might contribute to headaches Consider biofeedback. With this mind-body technique, you learn to control certain bodily functions -- such as muscle tension, heart rate and blood pressure -- to prevent headaches or reduce headache pain.    Proceed to emergency room if you experience new or worsening symptoms or symptoms do not resolve, if you have new neurologic symptoms or if headache is severe, or for any concerning symptom.   Provided education and documentation from American headache Society toolbox including articles on: chronic migraine medication overuse headache, chronic migraines, prevention of migraines, behavioral and other nonpharmacologic treatments for headache.  Cc: Perlie Mayo, NP,  Perlie Mayo, NP  Sarina Ill, MD  [facility] [address] [place], [place] [address]  Phone [phone] Fax [phone]

## 2020-01-08 ENCOUNTER — Encounter: Payer: Self-pay | Admitting: Neurology

## 2020-01-09 ENCOUNTER — Telehealth: Payer: Self-pay | Admitting: Neurology

## 2020-01-09 NOTE — Telephone Encounter (Signed)
medicaid order sent to GI. They will obtain the auth and reach out to the patient to schedule.  °

## 2020-01-10 NOTE — Telephone Encounter (Signed)
[employer] medicaid pending

## 2020-01-11 ENCOUNTER — Telehealth: Payer: Self-pay | Admitting: *Deleted

## 2020-01-11 DIAGNOSIS — G43709 Chronic migraine without aura, not intractable, without status migrainosus: Secondary | ICD-10-CM

## 2020-01-11 NOTE — Telephone Encounter (Signed)
Spoke with pt to gather info for PA. She couldn't remember taking Metoprolol. She only took the Topamax for about a week. Emgality PA completed on Cover My Meds. Key: [id number]. Awaiting determination from [facility].

## 2020-01-15 ENCOUNTER — Encounter: Payer: Self-pay | Admitting: Family Medicine

## 2020-01-15 ENCOUNTER — Other Ambulatory Visit: Payer: Self-pay | Admitting: Family Medicine

## 2020-01-15 DIAGNOSIS — Z716 Tobacco abuse counseling: Secondary | ICD-10-CM

## 2020-01-15 NOTE — Telephone Encounter (Signed)
[employer] healthy blue Josem Kaufmann: [id number] (exp. 01/10/20 to 03/09/20) order faxed to [facility]. they will reach out to the patient to schedule

## 2020-01-16 ENCOUNTER — Other Ambulatory Visit: Payer: Self-pay | Admitting: *Deleted

## 2020-01-16 ENCOUNTER — Telehealth: Payer: Self-pay | Admitting: Neurology

## 2020-01-16 DIAGNOSIS — G5603 Carpal tunnel syndrome, bilateral upper limbs: Secondary | ICD-10-CM

## 2020-01-16 DIAGNOSIS — E7841 Elevated Lipoprotein(a): Secondary | ICD-10-CM

## 2020-01-16 MED ORDER — ROSUVASTATIN CALCIUM 5 MG PO TABS: 5.0000 mg | ORAL_TABLET | Freq: Every day | ORAL | 1 refills | Status: DC

## 2020-01-16 MED ORDER — VARENICLINE TARTRATE 1 MG PO TABS: 1.0000 mg | ORAL_TABLET | Freq: Two times a day (BID) | ORAL | 0 refills | Status: DC

## 2020-01-16 NOTE — Telephone Encounter (Signed)
Spoke with Dr. Jaynee Eagles. Ok to place referral to Dr. Durward Fortes for bilateral carpal tunnel syndrome. Referral placed.

## 2020-01-16 NOTE — Telephone Encounter (Signed)
I spoke with the patient and discussed message from Dr. Jaynee Eagles. Pt in agreement to cancel EMG/NCV and refer to Dr. Durward Fortes. Her questions were answered. She verbalized appreciation for the call.   EMG/NCV for 01/25/20 canceled and referral placed.

## 2020-01-16 NOTE — Telephone Encounter (Signed)
I consulted with Dr. Ernestina Patches. Please let patient know the following and cancel her emg/ncs. If she is agreeable we can refer her to Dr. Durward Fortes:   Really needs to follow-up with Dr. Joni Fears.  She needed surgery in 2019.  At least in my view she does not need repeat electrodiagnostic studies since she had fairly severe carpal tunnel syndrome at the time and that likely has not improved.  Again she should follow-up with Dr. Durward Fortes.

## 2020-01-16 NOTE — Addendum Note (Signed)
Addended by: Gildardo Griffes on: 01/16/2020 02:56 PM   Modules accepted: Orders

## 2020-01-16 NOTE — Telephone Encounter (Signed)
schedule for 01/24/20

## 2020-01-17 ENCOUNTER — Ambulatory Visit (INDEPENDENT_AMBULATORY_CARE_PROVIDER_SITE_OTHER): Payer: Medicaid Other | Admitting: Family Medicine

## 2020-01-17 ENCOUNTER — Encounter: Payer: Self-pay | Admitting: Family Medicine

## 2020-01-17 ENCOUNTER — Other Ambulatory Visit: Payer: Self-pay

## 2020-01-17 VITALS — BP 129/78 | HR 100 | Temp 98.2°F | Resp 18 | Ht 63.0 in | Wt 204.1 lb

## 2020-01-17 DIAGNOSIS — E669 Obesity, unspecified: Secondary | ICD-10-CM | POA: Diagnosis not present

## 2020-01-17 DIAGNOSIS — G43809 Other migraine, not intractable, without status migrainosus: Secondary | ICD-10-CM | POA: Diagnosis not present

## 2020-01-17 MED ORDER — PHENTERMINE HCL 37.5 MG PO TABS: 37.5000 mg | ORAL_TABLET | Freq: Every day | ORAL | 0 refills | Status: DC

## 2020-01-17 NOTE — Progress Notes (Signed)
Subjective:  Patient ID: April Ayala, female    DOB: April 08, 1976  Age: 44 y.o. MRN: [id number]  CC:  Chief Complaint  Patient presents with  . Follow-up      HPI  HPI   April Ayala is a 44 year old female patient of mine who presents today for follow-up on weight loss.  She is lost 2 pounds since starting the phentermine.  She reports that she is feeling good and thinks it is helping her.  Additionally she reports that she has stopped smoking.  Still uses Chantix as needed.  Has not smoked in almost a month. She followed up with a neurologist and started injections for migraines.  Reports she had 1 since the first.  Overall doing well with this.  Today patient denies signs and symptoms of COVID 19 infection including fever, chills, cough, shortness of breath, and headache. Past Medical, Surgical, Social History, Allergies, and Medications have been Reviewed.   Past Medical History:  Diagnosis Date  . Allergy   . Cervical cancer ([place]) 04/2011   Stage 1B squamous cell  . Chronic left shoulder pain 10/07/2017  . Chronic tension-type headache, not intractable 10/07/2017  . History of kidney stones    passed  . S/P lumbar fusion 04/20/2018    Current Meds  Medication Sig  . amitriptyline (ELAVIL) 75 MG tablet Take 1 tablet (75 mg total) by mouth at bedtime.  . busPIRone (BUSPAR) 5 MG tablet Take 1 tablet (5 mg total) by mouth 2 (two) times daily.  . cyclobenzaprine (FLEXERIL) 10 MG tablet cyclobenzaprine 10 mg tablet  Take 1 tablet 3 times a day by oral route as directed.  . fluconazole (DIFLUCAN) 150 MG tablet Take 1 now and repeat 1 in 3 days  . Galcanezumab-gnlm (EMGALITY) 120 MG/ML SOAJ Inject 120 mg into the skin every 30 (thirty) days.  Marland Kitchen levocetirizine (XYZAL) 5 MG tablet Take 1 tablet (5 mg total) by mouth every evening.  Marland Kitchen oxyCODONE-acetaminophen (PERCOCET) 10-325 MG tablet oxycodone-acetaminophen 10 mg-325 mg tablet  Take 1 tablet 4 times a day by oral route  as needed.  . phentermine (ADIPEX-P) 37.5 MG tablet Take 1 tablet (37.5 mg total) by mouth daily before breakfast.  . pregabalin (LYRICA) 75 MG capsule pregabalin 75 mg capsule  TAKE 1 CAPSULE BY MOUTH TWICE DAILY  . rizatriptan (MAXALT-MLT) 10 MG disintegrating tablet Take 1 tablet (10 mg total) by mouth as needed for migraine. May repeat in 2 hours if needed  . rosuvastatin (CRESTOR) 5 MG tablet Take 1 tablet (5 mg total) by mouth daily.  . varenicline (CHANTIX) 1 MG tablet Take 1 tablet (1 mg total) by mouth 2 (two) times daily.  . Vitamin D, Ergocalciferol, (DRISDOL) 1.25 MG (50000 UNIT) CAPS capsule Take 1 capsule (50,000 Units total) by mouth every 7 (seven) days.  . [DISCONTINUED] phentermine (ADIPEX-P) 37.5 MG tablet Take 0.5 tablets (18.75 mg total) by mouth daily before breakfast.    ROS:  Review of Systems  Constitutional: Negative.   HENT: Negative.   Eyes: Negative.   Respiratory: Negative.   Cardiovascular: Negative.   Gastrointestinal: Negative.   Genitourinary: Negative.   Musculoskeletal: Negative.   Skin: Negative.   Neurological: Positive for headaches.  Endo/Heme/Allergies: Negative.   Psychiatric/Behavioral: Negative.   All other systems reviewed and are negative.    Objective:   Today's Vitals: BP 129/78 (BP Location: Right Arm, Patient Position: Sitting, Cuff Size: Normal)   Pulse 100   Temp 98.2 F (36.8  C) (Oral)   Resp 18   Ht 5\' 3"  (1.6 m)   Wt 204 lb 1.9 oz (92.6 kg)   LMP 04/11/2012   SpO2 99%   BMI 36.16 kg/m  Vitals with BMI 01/17/2020 01/04/2020 12/27/2019  Height 5\' 3"  5\' 3"  5\' 3"   Weight 204 lbs 2 oz 206 lbs 212 lbs  BMI 36.17 73.5 32.99  Systolic 242 683 419  Diastolic 78 77 79  Pulse 622 97 -     Physical Exam Vitals and nursing note reviewed.  Constitutional:      Appearance: Normal appearance. She is well-developed and well-groomed. She is obese.  HENT:     Head: Normocephalic and atraumatic.     Right Ear: External ear  normal.     Left Ear: External ear normal.     Mouth/Throat:     Comments: Mask in place  Eyes:     General:        Right eye: No discharge.        Left eye: No discharge.     Conjunctiva/sclera: Conjunctivae normal.  Cardiovascular:     Rate and Rhythm: Normal rate and regular rhythm.     Pulses: Normal pulses.     Heart sounds: Normal heart sounds.  Pulmonary:     Effort: Pulmonary effort is normal.     Breath sounds: Normal breath sounds.  Musculoskeletal:        General: Normal range of motion.     Cervical back: Normal range of motion and neck supple.  Skin:    General: Skin is warm.  Neurological:     General: No focal deficit present.     Mental Status: She is alert and oriented to person, place, and time.  Psychiatric:        Attention and Perception: Attention normal.        Mood and Affect: Mood normal.        Speech: Speech normal.        Behavior: Behavior normal. Behavior is cooperative.        Thought Content: Thought content normal.        Cognition and Memory: Cognition normal.        Judgment: Judgment normal.      Assessment   1. Other migraine without status migrainosus, not intractable   2. Obesity (BMI 30-39.9)     Tests ordered No orders of the defined types were placed in this encounter.    Plan: Please see assessment and plan per problem list above.   Meds ordered this encounter  Medications  . phentermine (ADIPEX-P) 37.5 MG tablet    Sig: Take 1 tablet (37.5 mg total) by mouth daily before breakfast.    Dispense:  30 tablet    Refill:  0    Order Specific Question:   Supervising Provider    Answer:   Fayrene Helper [[id number]]    Patient to follow-up in 4 weeks.  Perlie Mayo, NP

## 2020-01-17 NOTE — Patient Instructions (Addendum)
I appreciate the opportunity to provide you with care for your health and wellness. Today we discussed: weight check  Follow up: 4 weeks for weight check  No labs or referrals today  Continue medications as ordered KEEP UP WITH DIET AND LIFESTYLE CHANGES !  Please continue to practice social distancing to keep you, your family, and our community safe.  If you must go out, please wear a mask and practice good handwashing.  It was a pleasure to see you and I look forward to continuing to work together on your health and well-being. Please do not hesitate to call the office if you need care or have questions about your care.  Have a wonderful day and week. With Gratitude, Cherly Beach, DNP, AGNP-BC

## 2020-01-17 NOTE — Assessment & Plan Note (Signed)
Followed by neurologist now, on Emgality and Maxalt for breakthrough.

## 2020-01-17 NOTE — Assessment & Plan Note (Signed)
She has been on Adipex now has lost 2 pounds in about 3 weeks.  I encouraged her to continue to focus on diet control and lifestyle changes as well as exercising.  She has stopped smoking and this is made her more hungry but overall she is doing well.  We will continue 1 month at full dose Adipex.

## 2020-01-23 ENCOUNTER — Other Ambulatory Visit: Payer: Medicaid Other

## 2020-01-24 DIAGNOSIS — R519 Headache, unspecified: Secondary | ICD-10-CM | POA: Diagnosis not present

## 2020-01-25 ENCOUNTER — Encounter: Payer: Medicaid Other | Admitting: Neurology

## 2020-01-30 ENCOUNTER — Other Ambulatory Visit: Payer: Self-pay

## 2020-01-30 ENCOUNTER — Ambulatory Visit (INDEPENDENT_AMBULATORY_CARE_PROVIDER_SITE_OTHER): Payer: Medicaid Other | Admitting: Orthopaedic Surgery

## 2020-01-30 ENCOUNTER — Encounter: Payer: Self-pay | Admitting: Orthopaedic Surgery

## 2020-01-30 VITALS — Ht 63.0 in | Wt 204.0 lb

## 2020-01-30 DIAGNOSIS — G5603 Carpal tunnel syndrome, bilateral upper limbs: Secondary | ICD-10-CM | POA: Diagnosis not present

## 2020-01-30 NOTE — Progress Notes (Signed)
Office Visit Note   Patient: April Ayala           Date of Birth: [birth year]           MRN: [id number] Visit Date: 01/30/2020              Requested by: April Ayala, April Ayala,  April Ayala [id number] PCP: April Mayo, NP   Assessment & Plan: Visit Diagnoses:  1. Bilateral carpal tunnel syndrome     Plan: April Ayala was seen several years ago for evaluation of bilateral carpal tunnel syndrome.  She had surgery scheduled for release but decided timing was not right.  She has been having more more trouble to the point of compromise now would like to proceed with surgery.  She had EMGs and nerve conduction studies in 2019 by April Ayala revealing a moderate to severe right median nerve entrapment at the wrist affecting both sensory and motor components as well as a severe left median nerve entrapment at the wrist affecting sensory and motor components.  That lesion was characterized by sensory and motor demyelinization with evidence of axonal injury.  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  She like to proceed with a left carpal tunnel release at this point.  We had a long discussion regarding the incision, time out of work potential for pain relief which might be an issue given the severity of the compression and axonal injury from her studies 2 years ago.  She is fully aware that she may have less than 100% relief.  We will schedule at her convenience.  May have difficulty returning to repetitive activity even after surgery given the severity of the compression  Follow-Up Instructions: Return We will schedule right carpal tunnel release.   Orders:  No orders of the defined types were placed in this encounter.  No orders of the defined types were placed in this encounter.     Procedures: No procedures performed   Clinical Data: No additional  findings.   Subjective: Chief Complaint  Patient presents with   Left Wrist - Pain   Right Wrist - Pain   Right Elbow - Pain  Evaluated in 2019 for bilateral carpal tunnel syndrome with EMGs and nerve conduction studies revealing moderate to severe right carpal tunnel syndrome and severe left carpal tunnel syndrome with axonal injury.  Decided against surgery at that time but now having more more problem to the point of compromise and wishes to revisit surgical option.  Having more trouble on the left with numbness tingling and pain.  Dropping objects.  Presently working in Manufacturing engineer department at [employer] which appears to have aggravated her pain  HPI  Review of Systems   Objective: Vital Signs: Ht 5\' 3"  (1.6 m)    Wt (!) 204 lb (92.5 kg)    LMP 04/11/2012    BMI 36.14 kg/m   Physical Exam Constitutional:      Appearance: She is well-developed.  Eyes:     Pupils: Pupils are equal, round, and reactive to light.  Pulmonary:     Effort: Pulmonary effort is normal.  Skin:    General: Skin is warm and dry.  Neurological:     Mental Status: She is alert and oriented to person, place, and time.  Psychiatric:        Behavior: Behavior normal.     Ortho  Exam awake alert and oriented x3.  Comfortable sitting.  Good opposition of thumb to little finger bilaterally without obvious thenar atrophy.  Does have some persistent numbness in the left long finger.  Good capillary refill.  Positive Phalen's and Tinel's bilaterally.  Also has evidence of tennis elbow on the right with tenderness directly over the medial epicondyle with grip particularly in extension  Specialty Comments:  No specialty comments available.  Imaging: No results found.   PMFS History: Patient Active Problem List   Diagnosis Date Noted   Vaginal Pap smear following hysterectomy for malignancy 12/18/2019   Screening for colorectal cancer 12/18/2019   Vaginal discharge 12/18/2019   Yeast infection  12/18/2019   RLQ abdominal pain 12/18/2019   Dysuria 11/22/2019   History of cervical cancer 10/27/2019   Environmental and seasonal allergies 09/29/2019   Encounter for smoking cessation counseling 09/29/2019   Binge eating 09/29/2019   Migraine 09/29/2019   Bilateral carpal tunnel syndrome 09/29/2019   Encounter for screening mammogram for malignant neoplasm of breast 09/29/2019   Obesity (BMI 30-39.9) 09/29/2019   Family history of hyperlipidemia 09/29/2019   Family history of diabetes mellitus 09/29/2019   Family history of hypothyroidism 09/29/2019   Other fatigue 09/29/2019   Vitamin D deficiency 09/29/2019   Past Medical History:  Diagnosis Date   Allergy    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   Chronic left shoulder pain 10/07/2017   Chronic tension-type headache, not intractable 10/07/2017   History of kidney stones    passed   S/P lumbar fusion 04/20/2018    Family History  Problem Relation Age of Onset   Diabetes Mother    Hypertension Father    Diabetes Sister    Migraines Sister    Hypothyroidism Sister    Migraines Sister    Migraines Sister     Past Surgical History:  Procedure Laterality Date   ABDOMINAL HYSTERECTOMY     BREAST BIOPSY Right    CERVICAL CONIZATION W/BX  03/24/2012   Procedure: CONIZATION CERVIX WITH BIOPSY;  Surgeon: April Mode, MD;  Location: [facility];  Service: Gynecology;  Laterality: N/A;   DIAGNOSTIC LAPAROSCOPY  2007   ectopic   ECTOPIC PREGNANCY SURGERY     ESSURE TUBAL LIGATION  2010   INSERTION OF SUPRAPUBIC CATHETER  05/03/2012   Procedure: INSERTION OF SUPRAPUBIC CATHETER;  Surgeon: April Chapel, MD;  Location: [facility];  Service: Gynecology;;   LEEP     LUMBAR LAMINECTOMY/DECOMPRESSION MICRODISCECTOMY N/A 04/20/2018   Procedure: LUMBAR FIVE TO SACRAL ONE DECOMPRESSION AND FUSION, POSSIBLE LUMBAR FOUR TO SACRAL ONE; SCREW AND CAGE INSTRUMENTATION;  Surgeon:  April Schools, MD;  Location: [facility];  Service: Orthopedics;  Laterality: N/A;  5 hrs   LYMPHADENECTOMY  05/03/2012   Procedure: LYMPHADENECTOMY;  Surgeon: April Chapel, MD;  Location: [facility];  Service: Gynecology;  Laterality: Bilateral;  pelvic   RADICAL HYSTERECTOMY  05/03/2012   Procedure: RADICAL HYSTERECTOMY;  Surgeon: April Chapel, MD;  Location: [facility];  Service: Gynecology;;  transposition of the ovaries   SPINE SURGERY     TUBAL LIGATION     Social History   Occupational History   Occupation: [employer]  Tobacco Use   Smoking status: Former Smoker    Packs/day: 0.50    Years: 1.00    Pack years: 0.50    Types: Cigarettes    Start date: 11/03/2018   Smokeless tobacco: Never Used   Tobacco comment:  smoking cessation information given  Vaping Use   Vaping Use: Never used  Substance and Sexual Activity   Alcohol use: Never   Drug use: No   Sexual activity: Not Currently    Birth control/protection: Surgical    Comment: hyst     Garald Balding, MD   Note - This record has been created using [employer].  Chart creation errors have been sought, but may not always  have been located. Such creation errors do not reflect on  the standard of medical care.

## 2020-01-31 ENCOUNTER — Other Ambulatory Visit: Payer: Medicaid Other

## 2020-02-01 ENCOUNTER — Encounter: Payer: Self-pay | Admitting: Family Medicine

## 2020-02-02 ENCOUNTER — Encounter: Payer: Self-pay | Admitting: Family Medicine

## 2020-02-08 NOTE — Telephone Encounter (Signed)
Per Cover My Meds, Emgality denied by insurance. Pt has a Conservation officer, historic buildings.

## 2020-02-12 NOTE — Telephone Encounter (Signed)
I called pt on mobile and LVM (ok per DPR) asking for call back. I advised we are wanting to appeal the Emgality denial but Medicaid requires her written permission first. I advised we can send form to her either via mail or email. Asked for call back.

## 2020-02-12 NOTE — Telephone Encounter (Signed)
This is what I have from a review of her records: From a thorough review of records, medications tried that can be used in migraine management include : Topamax, amitriptyline, flexeril, lyrica, maxalt, tylenol, baclofen, fioricet, neurontin, indomethacin, ibuprofen, toradol, robaxin, meoprolol, naproxen, zofran, phenergan, maxalt, trmadol. I believe I saw metoprolol in her med list during review. We could try Topamax again but It may have given her side effects.Marland KitchenMarland Kitchen

## 2020-02-13 ENCOUNTER — Other Ambulatory Visit: Payer: Medicaid Other

## 2020-02-14 ENCOUNTER — Encounter: Payer: Self-pay | Admitting: Family Medicine

## 2020-02-14 ENCOUNTER — Other Ambulatory Visit: Payer: Self-pay

## 2020-02-14 ENCOUNTER — Telehealth (INDEPENDENT_AMBULATORY_CARE_PROVIDER_SITE_OTHER): Payer: Medicaid Other | Admitting: Family Medicine

## 2020-02-14 VITALS — BP 129/78 | Ht 63.0 in | Wt 213.0 lb

## 2020-02-14 DIAGNOSIS — R058 Other specified cough: Secondary | ICD-10-CM | POA: Insufficient documentation

## 2020-02-14 DIAGNOSIS — E669 Obesity, unspecified: Secondary | ICD-10-CM | POA: Diagnosis not present

## 2020-02-14 DIAGNOSIS — R05 Cough: Secondary | ICD-10-CM | POA: Diagnosis not present

## 2020-02-14 NOTE — Telephone Encounter (Signed)
Pt states the pharmacy told her she is out of refills on her Galcanezumab-gnlm (EMGALITY) 120 MG/ML SOAJ but her box says she has 11 refills.  Pt is asking for a call from RN

## 2020-02-14 NOTE — Patient Instructions (Addendum)
I appreciate the opportunity to provide you with care for your health and wellness. Today we discussed: weight and virus  Follow up: March for annual  No labs or referrals today  Please continue to practice social distancing to keep you, your family, and our community safe.  If you must go out, please wear a mask and practice good handwashing.  It was a pleasure to see you and I look forward to continuing to work together on your health and well-being. Please do not hesitate to call the office if you need care or have questions about your care.  Have a wonderful day and week. With Gratitude, Cherly Beach, DNP, AGNP-BC    Calorie Counting for Weight Loss Calories are units of energy. Your body needs a certain amount of calories from food to keep you going throughout the day. When you eat more calories than your body needs, your body stores the extra calories as fat. When you eat fewer calories than your body needs, your body burns fat to get the energy it needs. Calorie counting means keeping track of how many calories you eat and drink each day. Calorie counting can be helpful if you need to lose weight. If you make sure to eat fewer calories than your body needs, you should lose weight. Ask your health care provider what a healthy weight is for you. For calorie counting to work, you will need to eat the right number of calories in a day in order to lose a healthy amount of weight per week. A dietitian can help you determine how many calories you need in a day and will give you suggestions on how to reach your calorie goal.  A healthy amount of weight to lose per week is usually 1-2 lb (0.5-0.9 kg). This usually means that your daily calorie intake should be reduced by 500-750 calories.  Eating 1,200 - 1,500 calories per day can help most women lose weight.  Eating 1,500 - 1,800 calories per day can help most men lose weight. What do I need to know about calorie counting? In order to  meet your daily calorie goal, you will need to:  Find out how many calories are in each food you would like to eat. Try to do this before you eat.  Decide how much of the food you plan to eat.  Write down what you ate and how many calories it had. Doing this is called keeping a food log. To successfully lose weight, it is important to balance calorie counting with a healthy lifestyle that includes regular activity. Aim for 150 minutes of moderate exercise (such as walking) or 75 minutes of vigorous exercise (such as running) each week. Where do I find calorie information?  The number of calories in a food can be found on a Nutrition Facts label. If a food does not have a Nutrition Facts label, try to look up the calories online or ask your dietitian for help. Remember that calories are listed per serving. If you choose to have more than one serving of a food, you will have to multiply the calories per serving by the amount of servings you plan to eat. For example, the label on a package of bread might say that a serving size is 1 slice and that there are 90 calories in a serving. If you eat 1 slice, you will have eaten 90 calories. If you eat 2 slices, you will have eaten 180 calories. How do I keep a food  log? Immediately after each meal, record the following information in your food log:  What you ate. Don't forget to include toppings, sauces, and other extras on the food.  How much you ate. This can be measured in cups, ounces, or number of items.  How many calories each food and drink had.  The total number of calories in the meal. Keep your food log near you, such as in a small notebook in your pocket, or use a mobile app or website. Some programs will calculate calories for you and show you how many calories you have left for the day to meet your goal. What are some calorie counting tips?   Use your calories on foods and drinks that will fill you up and not leave you hungry: ? Some  examples of foods that fill you up are nuts and nut butters, vegetables, lean proteins, and high-fiber foods like whole grains. High-fiber foods are foods with more than 5 g fiber per serving. ? Drinks such as sodas, specialty coffee drinks, alcohol, and juices have a lot of calories, yet do not fill you up.  Eat nutritious foods and avoid empty calories. Empty calories are calories you get from foods or beverages that do not have many vitamins or protein, such as candy, sweets, and soda. It is better to have a nutritious high-calorie food (such as an avocado) than a food with few nutrients (such as a bag of chips).  Know how many calories are in the foods you eat most often. This will help you calculate calorie counts faster.  Pay attention to calories in drinks. Low-calorie drinks include water and unsweetened drinks.  Pay attention to nutrition labels for "low fat" or "fat free" foods. These foods sometimes have the same amount of calories or more calories than the full fat versions. They also often have added sugar, starch, or salt, to make up for flavor that was removed with the fat.  Find a way of tracking calories that works for you. Get creative. Try different apps or programs if writing down calories does not work for you. What are some portion control tips?  Know how many calories are in a serving. This will help you know how many servings of a certain food you can have.  Use a measuring cup to measure serving sizes. You could also try weighing out portions on a kitchen scale. With time, you will be able to estimate serving sizes for some foods.  Take some time to put servings of different foods on your favorite plates, bowls, and cups so you know what a serving looks like.  Try not to eat straight from a bag or box. Doing this can lead to overeating. Put the amount you would like to eat in a cup or on a plate to make sure you are eating the right portion.  Use smaller plates,  glasses, and bowls to prevent overeating.  Try not to multitask (for example, watch TV or use your computer) while eating. If it is time to eat, sit down at a table and enjoy your food. This will help you to know when you are full. It will also help you to be aware of what you are eating and how much you are eating. What are tips for following this plan? Reading food labels  Check the calorie count compared to the serving size. The serving size may be smaller than what you are used to eating.  Check the source of the calories. Make  sure the food you are eating is high in vitamins and protein and low in saturated and trans fats. Shopping  Read nutrition labels while you shop. This will help you make healthy decisions before you decide to purchase your food.  Make a grocery list and stick to it. Cooking  Try to cook your favorite foods in a healthier way. For example, try baking instead of frying.  Use low-fat dairy products. Meal planning  Use more fruits and vegetables. Half of your plate should be fruits and vegetables.  Include lean proteins like poultry and fish. How do I count calories when eating out?  Ask for smaller portion sizes.  Consider sharing an entree and sides instead of getting your own entree.  If you get your own entree, eat only half. Ask for a box at the beginning of your meal and put the rest of your entree in it so you are not tempted to eat it.  If calories are listed on the menu, choose the lower calorie options.  Choose dishes that include vegetables, fruits, whole grains, low-fat dairy products, and lean protein.  Choose items that are boiled, broiled, grilled, or steamed. Stay away from items that are buttered, battered, fried, or served with cream sauce. Items labeled "crispy" are usually fried, unless stated otherwise.  Choose water, low-fat milk, unsweetened iced tea, or other drinks without added sugar. If you want an alcoholic beverage, choose a  lower calorie option such as a glass of wine or light beer.  Ask for dressings, sauces, and syrups on the side. These are usually high in calories, so you should limit the amount you eat.  If you want a salad, choose a garden salad and ask for grilled meats. Avoid extra toppings like bacon, cheese, or fried items. Ask for the dressing on the side, or ask for olive oil and vinegar or lemon to use as dressing.  Estimate how many servings of a food you are given. For example, a serving of cooked rice is  cup or about the size of half a baseball. Knowing serving sizes will help you be aware of how much food you are eating at restaurants. The list below tells you how big or small some common portion sizes are based on everyday objects: ? 1 oz--4 stacked dice. ? 3 oz--1 deck of cards. ? 1 tsp--1 die. ? 1 Tbsp-- a ping-pong ball. ? 2 Tbsp--1 ping-pong ball. ?  cup-- baseball. ? 1 cup--1 baseball. Summary  Calorie counting means keeping track of how many calories you eat and drink each day. If you eat fewer calories than your body needs, you should lose weight.  A healthy amount of weight to lose per week is usually 1-2 lb (0.5-0.9 kg). This usually means reducing your daily calorie intake by 500-750 calories.  The number of calories in a food can be found on a Nutrition Facts label. If a food does not have a Nutrition Facts label, try to look up the calories online or ask your dietitian for help.  Use your calories on foods and drinks that will fill you up, and not on foods and drinks that will leave you hungry.  Use smaller plates, glasses, and bowls to prevent overeating. This information is not intended to replace advice given to you by your health care provider. Make sure you discuss any questions you have with your health care provider. Document Revised: 03/11/2018 Document Reviewed: 05/22/2016 Elsevier Patient Education  [place].

## 2020-02-14 NOTE — Assessment & Plan Note (Signed)
Stopping Adipex at this time.  I have encouraged a nutritionist and/or visit to the healthy weight and wellness clinic.  As she needs to help get more focus of her diet and lifestyle changes as well as exercising.

## 2020-02-14 NOTE — Telephone Encounter (Signed)
Patient called and said that Medicaid covered the Emgality last month, she only had to pay $3. She did mention that she did just get a new insurance but will still have Medicaid. I asked her for the card information and she said it didn't have an ID number but it had RxBin: [id number] PCN: [id number] so I'm thinking it may be a Rx card.

## 2020-02-14 NOTE — Progress Notes (Signed)
Virtual Visit via Telephone Note   This visit type was conducted due to national recommendations for restrictions regarding the COVID-19 Pandemic (e.g. social distancing) in an effort to limit this patient's exposure and mitigate transmission in our community.  Due to her co-morbid illnesses, this patient is at least at moderate risk for complications without adequate follow up.  This format is felt to be most appropriate for this patient at this time.  The patient did not have access to video technology/had technical difficulties with video requiring transitioning to audio format only (telephone).  All issues noted in this document were discussed and addressed.  No physical exam could be performed with this format.  Evaluation Performed:  Follow-up visit  Date:  02/14/2020   ID:  JOCI DRESS, DOB [birth year], MRN [id number]  Patient Location: Home Provider Location: Office/Clinic  Location of Patient: Home Location of Provider: Telehealth Consent was obtain for visit to be over via telehealth. I verified that I am speaking with the correct person using two identifiers.  PCP:  Perlie Mayo, NP   Chief Complaint: Weight check in  History of Present Illness:    CHEVETTE FEE is a 44 y.o. female with history of obesity.  I have started her on Adipex she seemed to be doing very well last month at check-in.  Reports that now she is up to 213 from 204 at her last weigh-in here in the office.  This is a scalloped [employer] there could be a definite discrepancy.  However she reports that she has not changed her diet or anything.  We discussed options as possible nutritionist and or referral to a weight management clinic.  Of note she reports that she has had about 4 days of cough and congestion.  Reports that she started Mucinex and is starting to feel little bit better at this time.  Denies having any chest pain, shortness of breath fevers or chills.  The patient does not have  symptoms concerning for COVID-19 infection (fever, chills, cough, or new shortness of breath).   Past Medical, Surgical, Social History, Allergies, and Medications have been Reviewed.  Past Medical History:  Diagnosis Date  . Allergy   . Cervical cancer ([place]) 04/2011   Stage 1B squamous cell  . Chronic left shoulder pain 10/07/2017  . Chronic tension-type headache, not intractable 10/07/2017  . History of kidney stones    passed  . S/P lumbar fusion 04/20/2018   Past Surgical History:  Procedure Laterality Date  . ABDOMINAL HYSTERECTOMY    . BREAST BIOPSY Right   . CERVICAL CONIZATION W/BX  03/24/2012   Procedure: CONIZATION CERVIX WITH BIOPSY;  Surgeon: Woodroe Mode, MD;  Location: [facility];  Service: Gynecology;  Laterality: N/A;  . DIAGNOSTIC LAPAROSCOPY  2007   ectopic  . ECTOPIC PREGNANCY SURGERY    . ESSURE TUBAL LIGATION  2010  . INSERTION OF SUPRAPUBIC CATHETER  05/03/2012   Procedure: INSERTION OF SUPRAPUBIC CATHETER;  Surgeon: Alvino Chapel, MD;  Location: [facility];  Service: Gynecology;;  . LEEP    . LUMBAR LAMINECTOMY/DECOMPRESSION MICRODISCECTOMY N/A 04/20/2018   Procedure: LUMBAR FIVE TO SACRAL ONE DECOMPRESSION AND FUSION, POSSIBLE LUMBAR FOUR TO SACRAL ONE; SCREW AND CAGE INSTRUMENTATION;  Surgeon: Melina Schools, MD;  Location: [facility];  Service: Orthopedics;  Laterality: N/A;  5 hrs  . LYMPHADENECTOMY  05/03/2012   Procedure: LYMPHADENECTOMY;  Surgeon: Alvino Chapel, MD;  Location: [facility];  Service: Gynecology;  Laterality: Bilateral;  pelvic  . RADICAL HYSTERECTOMY  05/03/2012   Procedure: RADICAL HYSTERECTOMY;  Surgeon: Alvino Chapel, MD;  Location: [facility];  Service: Gynecology;;  transposition of the ovaries  . SPINE SURGERY    . TUBAL LIGATION       Current Meds  Medication Sig  . amitriptyline (ELAVIL) 75 MG tablet Take 1 tablet (75 mg total) by mouth at bedtime.  . busPIRone (BUSPAR) 5 MG tablet Take 1 tablet (5 mg total) by mouth  2 (two) times daily.  . cyclobenzaprine (FLEXERIL) 10 MG tablet cyclobenzaprine 10 mg tablet  Take 1 tablet 3 times a day by oral route as directed.  . fluconazole (DIFLUCAN) 150 MG tablet Take 1 now and repeat 1 in 3 days  . Galcanezumab-gnlm (EMGALITY) 120 MG/ML SOAJ Inject 120 mg into the skin every 30 (thirty) days.  Marland Kitchen levocetirizine (XYZAL) 5 MG tablet Take 1 tablet (5 mg total) by mouth every evening.  Marland Kitchen oxyCODONE-acetaminophen (PERCOCET) 10-325 MG tablet oxycodone-acetaminophen 10 mg-325 mg tablet  Take 1 tablet 4 times a day by oral route as needed.  . phentermine (ADIPEX-P) 37.5 MG tablet Take 1 tablet (37.5 mg total) by mouth daily before breakfast.  . pregabalin (LYRICA) 75 MG capsule pregabalin 75 mg capsule  TAKE 1 CAPSULE BY MOUTH TWICE DAILY  . rizatriptan (MAXALT-MLT) 10 MG disintegrating tablet Take 1 tablet (10 mg total) by mouth as needed for migraine. May repeat in 2 hours if needed  . rosuvastatin (CRESTOR) 5 MG tablet Take 1 tablet (5 mg total) by mouth daily.  . varenicline (CHANTIX) 1 MG tablet Take 1 tablet (1 mg total) by mouth 2 (two) times daily.  . Vitamin D, Ergocalciferol, (DRISDOL) 1.25 MG (50000 UNIT) CAPS capsule Take 1 capsule (50,000 Units total) by mouth every 7 (seven) days.     Allergies:   Patient has no known allergies.   ROS:   Please see the history of present illness.    All other systems reviewed and are negative.   Labs/Other Tests and Data Reviewed:    Recent Labs: 09/29/2019: ALT 21; BUN 15; Creat 0.74; Hemoglobin 16.0; Platelets 329; Potassium 4.5; Sodium 138; TSH 2.99   Recent Lipid Panel Lab Results  Component Value Date/Time   CHOL 189 09/29/2019 11:05 AM   TRIG 126 09/29/2019 11:05 AM   HDL 43 (L) 09/29/2019 11:05 AM   CHOLHDL 4.4 09/29/2019 11:05 AM   LDLCALC 122 (H) 09/29/2019 11:05 AM    Wt Readings from Last 3 Encounters:  02/14/20 213 lb (96.6 kg)  01/30/20 (!) 204 lb (92.5 kg)  01/17/20 204 lb 1.9 oz (92.6 kg)      Objective:    Vital Signs:  BP 129/78   Ht 5\' 3"  (1.6 m)   Wt 213 lb (96.6 kg)   LMP 04/11/2012   BMI 37.73 kg/m    VITAL SIGNS:  reviewed GEN:  alert and oriented RESPIRATORY:  no shortness of breath in conversation  PSYCH:  normal affect and mood   ASSESSMENT & PLAN:    1. Obesity (BMI 30-39.9)  2. Cough with expectoration   Time:   Today, I have spent 10 minutes with the patient with telehealth technology discussing the above problems.     Medication Adjustments/Labs and Tests Ordered: Current medicines are reviewed at length with the patient today.  Concerns regarding medicines are outlined above.   Tests Ordered: No orders of the defined types were placed in this encounter.   Medication Changes: No orders  of the defined types were placed in this encounter.   Disposition:  Follow up March for annual Signed, Perlie Mayo, NP  02/14/2020 3:17 PM     [facility]

## 2020-02-14 NOTE — Assessment & Plan Note (Signed)
Improving with use of Mucinex.  Denies fevers chills shortness of breath

## 2020-02-15 ENCOUNTER — Encounter: Payer: Self-pay | Admitting: Family Medicine

## 2020-02-15 ENCOUNTER — Telehealth: Payer: Self-pay | Admitting: Neurology

## 2020-02-15 MED ORDER — EMGALITY 120 MG/ML ~~LOC~~ SOAJ: 120.0000 mg | SUBCUTANEOUS | 10 refills | Status: DC

## 2020-02-15 NOTE — Telephone Encounter (Signed)
I spoke with Caryl Pina at [facility]. She confirmed there were no refills on file for Emg[facility]. I e-scribed the refills while on the phone with her. She was able to process the claim for $3 and will get the medication ready for the patient. I called the pt and let her know. She verbalized appreciation.    We will not do any appeals at this time as it appears insurance is covering it after all.

## 2020-02-15 NOTE — Telephone Encounter (Signed)
MRI of the brain with and without contrast completed July 21,, 2021 at [facility] was normal

## 2020-02-15 NOTE — Telephone Encounter (Signed)
I called pt to discuss her medication. While I was on the phone I let her know her MRI brain was normal. She verbalized appreciation.

## 2020-02-15 NOTE — Addendum Note (Signed)
Addended by: Gildardo Griffes on: 02/15/2020 04:05 PM   Modules accepted: Orders

## 2020-02-15 NOTE — Telephone Encounter (Signed)
Patient called again this AM. She stated her refills were transferred from [facility] to [facility] in [place] but they are telling her she has no refills. I let the pt know on our end she should have refills and it may be that insurance isn't covering. I told her I would call [facility] to look into this and would be in touch.   [facility] in [place] 3 times and could not get a hold of anyone. Rang multiple times over several minutes with no answer.

## 2020-02-16 ENCOUNTER — Other Ambulatory Visit: Payer: Self-pay

## 2020-02-16 DIAGNOSIS — G43809 Other migraine, not intractable, without status migrainosus: Secondary | ICD-10-CM

## 2020-02-16 DIAGNOSIS — F419 Anxiety disorder, unspecified: Secondary | ICD-10-CM

## 2020-02-19 ENCOUNTER — Other Ambulatory Visit: Payer: Self-pay

## 2020-02-19 DIAGNOSIS — G43809 Other migraine, not intractable, without status migrainosus: Secondary | ICD-10-CM

## 2020-02-19 DIAGNOSIS — F419 Anxiety disorder, unspecified: Secondary | ICD-10-CM

## 2020-02-19 MED ORDER — AMITRIPTYLINE HCL 75 MG PO TABS: 75.0000 mg | ORAL_TABLET | Freq: Every day | ORAL | 1 refills | Status: DC

## 2020-02-20 ENCOUNTER — Encounter: Payer: Self-pay | Admitting: Family Medicine

## 2020-02-21 ENCOUNTER — Encounter: Payer: Self-pay | Admitting: Family Medicine

## 2020-02-21 ENCOUNTER — Other Ambulatory Visit: Payer: Self-pay

## 2020-02-21 ENCOUNTER — Telehealth (INDEPENDENT_AMBULATORY_CARE_PROVIDER_SITE_OTHER): Payer: Medicaid Other | Admitting: Family Medicine

## 2020-02-21 VITALS — Ht 63.0 in | Wt 213.0 lb

## 2020-02-21 DIAGNOSIS — Z716 Tobacco abuse counseling: Secondary | ICD-10-CM

## 2020-02-21 DIAGNOSIS — M7711 Lateral epicondylitis, right elbow: Secondary | ICD-10-CM | POA: Diagnosis not present

## 2020-02-21 MED ORDER — VARENICLINE TARTRATE 1 MG PO TABS: 1.0000 mg | ORAL_TABLET | Freq: Two times a day (BID) | ORAL | 0 refills | Status: DC

## 2020-02-21 NOTE — Assessment & Plan Note (Signed)
Reports tennis elbow is painful.  Starting to have problems with it radiating down the arm.  Would like to have a referral to someone here in town if able.  She was encouraged to see Dr. Aline Brochure by a friend who had a similar issue with same thing.

## 2020-02-21 NOTE — Progress Notes (Signed)
Virtual Visit via Telephone Note   This visit type was conducted due to national recommendations for restrictions regarding the COVID-19 Pandemic (e.g. social distancing) in an effort to limit this patient's exposure and mitigate transmission in our community.  Due to her co-morbid illnesses, this patient is at least at moderate risk for complications without adequate follow up.  This format is felt to be most appropriate for this patient at this time.  The patient did not have access to video technology/had technical difficulties with video requiring transitioning to audio format only (telephone).  All issues noted in this document were discussed and addressed.  No physical exam could be performed with this format.   Evaluation Performed:  Follow-up visit  Date:  02/21/2020   ID:  April Ayala, DOB December 28, 1975, MRN [id number]  Patient Location: Home Provider Location: Office/Clinic  Location of Patient: Home Location of Provider: Telehealth Consent was obtain for visit to be over via telehealth. I verified that I am speaking with the correct person using two identifiers.  PCP:  Perlie Mayo, NP   Chief Complaint: Tennis elbow  History of Present Illness:    April Ayala is a 44 y.o. female with with history of carpal tunnel, was seen by her carpal tunnel provider he reported that her complaints had like tennis elbow if she should be seen for this.  She calls in today to get a referral for tennis elbow.  Would like to stay here close in [place] and is requested Dr. Aline Brochure. It is her right side that gives her trouble.  She reports that is starting to radiate down her arm.  Denies having any injury or trauma to the site.  The patient does not have symptoms concerning for COVID-19 infection (fever, chills, cough, or new shortness of breath).   Past Medical, Surgical, Social History, Allergies, and Medications have been Reviewed.  Past Medical History:  Diagnosis  Date  . Allergy   . Cervical cancer ([place]) 04/2011   Stage 1B squamous cell  . Chronic left shoulder pain 10/07/2017  . Chronic tension-type headache, not intractable 10/07/2017  . Dysuria 11/22/2019  . History of kidney stones    passed  . S/P lumbar fusion 04/20/2018  . Vaginal discharge 12/18/2019  . Yeast infection 12/18/2019   Past Surgical History:  Procedure Laterality Date  . ABDOMINAL HYSTERECTOMY    . BREAST BIOPSY Right   . CERVICAL CONIZATION W/BX  03/24/2012   Procedure: CONIZATION CERVIX WITH BIOPSY;  Surgeon: Woodroe Mode, MD;  Location: [facility];  Service: Gynecology;  Laterality: N/A;  . DIAGNOSTIC LAPAROSCOPY  2007   ectopic  . ECTOPIC PREGNANCY SURGERY    . ESSURE TUBAL LIGATION  2010  . INSERTION OF SUPRAPUBIC CATHETER  05/03/2012   Procedure: INSERTION OF SUPRAPUBIC CATHETER;  Surgeon: Alvino Chapel, MD;  Location: [facility];  Service: Gynecology;;  . LEEP    . LUMBAR LAMINECTOMY/DECOMPRESSION MICRODISCECTOMY N/A 04/20/2018   Procedure: LUMBAR FIVE TO SACRAL ONE DECOMPRESSION AND FUSION, POSSIBLE LUMBAR FOUR TO SACRAL ONE; SCREW AND CAGE INSTRUMENTATION;  Surgeon: Melina Schools, MD;  Location: [facility];  Service: Orthopedics;  Laterality: N/A;  5 hrs  . LYMPHADENECTOMY  05/03/2012   Procedure: LYMPHADENECTOMY;  Surgeon: Alvino Chapel, MD;  Location: [facility];  Service: Gynecology;  Laterality: Bilateral;  pelvic  . RADICAL HYSTERECTOMY  05/03/2012   Procedure: RADICAL HYSTERECTOMY;  Surgeon: Alvino Chapel, MD;  Location: [facility];  Service: Gynecology;;  transposition of the ovaries  . SPINE SURGERY    . TUBAL LIGATION       Current Meds  Medication Sig  . amitriptyline (ELAVIL) 75 MG tablet Take 1 tablet (75 mg total) by mouth at bedtime.  . busPIRone (BUSPAR) 5 MG tablet Take 1 tablet (5 mg total) by mouth 2 (two) times daily.  . cyclobenzaprine (FLEXERIL) 10 MG tablet cyclobenzaprine 10 mg tablet  Take 1 tablet 3 times a day by oral route  as directed.  . fluconazole (DIFLUCAN) 150 MG tablet Take 1 now and repeat 1 in 3 days  . Galcanezumab-gnlm (EMGALITY) 120 MG/ML SOAJ Inject 120 mg into the skin every 30 (thirty) days.  Marland Kitchen levocetirizine (XYZAL) 5 MG tablet Take 1 tablet (5 mg total) by mouth every evening.  Marland Kitchen oxyCODONE-acetaminophen (PERCOCET) 10-325 MG tablet oxycodone-acetaminophen 10 mg-325 mg tablet  Take 1 tablet 4 times a day by oral route as needed.  . phentermine (ADIPEX-P) 37.5 MG tablet Take 1 tablet (37.5 mg total) by mouth daily before breakfast.  . pregabalin (LYRICA) 75 MG capsule pregabalin 75 mg capsule  TAKE 1 CAPSULE BY MOUTH TWICE DAILY  . rizatriptan (MAXALT-MLT) 10 MG disintegrating tablet Take 1 tablet (10 mg total) by mouth as needed for migraine. May repeat in 2 hours if needed  . rosuvastatin (CRESTOR) 5 MG tablet Take 1 tablet (5 mg total) by mouth daily.  . varenicline (CHANTIX) 1 MG tablet Take 1 tablet (1 mg total) by mouth 2 (two) times daily.  . Vitamin D, Ergocalciferol, (DRISDOL) 1.25 MG (50000 UNIT) CAPS capsule Take 1 capsule (50,000 Units total) by mouth every 7 (seven) days.  . [DISCONTINUED] varenicline (CHANTIX) 1 MG tablet Take 1 tablet (1 mg total) by mouth 2 (two) times daily.     Allergies:   Patient has no known allergies.   ROS:   Please see the history of present illness.    All other systems reviewed and are negative.   Labs/Other Tests and Data Reviewed:    Recent Labs: 09/29/2019: ALT 21; BUN 15; Creat 0.74; Hemoglobin 16.0; Platelets 329; Potassium 4.5; Sodium 138; TSH 2.99   Recent Lipid Panel Lab Results  Component Value Date/Time   CHOL 189 09/29/2019 11:05 AM   TRIG 126 09/29/2019 11:05 AM   HDL 43 (L) 09/29/2019 11:05 AM   CHOLHDL 4.4 09/29/2019 11:05 AM   LDLCALC 122 (H) 09/29/2019 11:05 AM    Wt Readings from Last 3 Encounters:  02/21/20 213 lb (96.6 kg)  02/14/20 213 lb (96.6 kg)  01/30/20 (!) 204 lb (92.5 kg)     Objective:    Vital Signs:  Ht 5'  3" (1.6 m)   Wt 213 lb (96.6 kg)   LMP 04/11/2012   BMI 37.73 kg/m    VITAL SIGNS:  reviewed GEN:  Alert and oriented RESPIRATORY:  No shortness of breath noted in conversation PSYCH:  Normal affect and mood  ASSESSMENT & PLAN:    1. Right tennis elbow  - Ambulatory referral to Orthopedic Surgery  2. Encounter for smoking cessation counseling  - varenicline (CHANTIX) 1 MG tablet; Take 1 tablet (1 mg total) by mouth 2 (two) times daily.  Dispense: 60 tablet; Refill: 0   Time:   Today, I have spent 10 minutes with the patient with telehealth technology discussing the above problems.     Medication Adjustments/Labs and Tests Ordered: Current medicines are reviewed at length with the patient today.  Concerns regarding medicines are outlined above.  Tests Ordered: Orders Placed This Encounter  Procedures  . Ambulatory referral to Orthopedic Surgery    Medication Changes: Meds ordered this encounter  Medications  . varenicline (CHANTIX) 1 MG tablet    Sig: Take 1 tablet (1 mg total) by mouth 2 (two) times daily.    Dispense:  60 tablet    Refill:  0    Order Specific Question:   Supervising Provider    Answer:   Fayrene Helper [[id number]]    Disposition:  Follow up as scheduled Signed, Perlie Mayo, NP  02/21/2020 2:19 PM     [facility]

## 2020-02-21 NOTE — Patient Instructions (Signed)
I appreciate the opportunity to provide you with care for your health and wellness. Today we discussed: Tennis elbow  Follow up: As scheduled  No labs  Referrals today Dr. Aline Brochure for tennis elbow  Please continue to practice social distancing to keep you, your family, and our community safe.  If you must go out, please wear a mask and practice good handwashing.  It was a pleasure to see you and I look forward to continuing to work together on your health and well-being. Please do not hesitate to call the office if you need care or have questions about your care.  Have a wonderful day and week. With Gratitude, Cherly Beach, DNP, AGNP-BC

## 2020-02-21 NOTE — Assessment & Plan Note (Signed)
Asked about quitting: confirms they are currently smokes cigarettes Advise to quit smoking: Educated about QUITTING to reduce the risk of cancer, cardio and cerebrovascular disease. Assess willingness: Unwilling to quit at this time, but is working on cutting back. Assist with counseling and pharmacotherapy: Counseled for 5 minutes and literature provided. Arrange for follow up: working on quitting follow up in 3 months and continue to offer help.   Recalling Chantix, she would like to get her restarted on this secondary to the recall.

## 2020-02-22 ENCOUNTER — Ambulatory Visit (INDEPENDENT_AMBULATORY_CARE_PROVIDER_SITE_OTHER): Payer: Medicaid Other

## 2020-02-22 ENCOUNTER — Other Ambulatory Visit: Payer: Self-pay

## 2020-02-22 DIAGNOSIS — R1031 Right lower quadrant pain: Secondary | ICD-10-CM | POA: Diagnosis not present

## 2020-02-22 NOTE — Progress Notes (Signed)
[facility]LVIC US TA/TV: normal vaginal cuff,normal ovaries,unable to slide ovaries,limited view of the ovaries on transvaginal images,no free fluid,right adnexal pain during ultrasound   Chaperone Peggy

## 2020-02-26 ENCOUNTER — Encounter: Payer: Self-pay | Admitting: Family Medicine

## 2020-02-27 ENCOUNTER — Other Ambulatory Visit: Payer: Self-pay

## 2020-02-27 DIAGNOSIS — J3089 Other allergic rhinitis: Secondary | ICD-10-CM

## 2020-02-27 DIAGNOSIS — F419 Anxiety disorder, unspecified: Secondary | ICD-10-CM

## 2020-02-27 MED ORDER — BUSPIRONE HCL 5 MG PO TABS: 5.0000 mg | ORAL_TABLET | Freq: Two times a day (BID) | ORAL | 1 refills | Status: DC

## 2020-02-27 MED ORDER — LEVOCETIRIZINE DIHYDROCHLORIDE 5 MG PO TABS: 5.0000 mg | ORAL_TABLET | Freq: Every evening | ORAL | 1 refills | Status: DC

## 2020-03-03 ENCOUNTER — Encounter: Payer: Self-pay | Admitting: Family Medicine

## 2020-03-06 ENCOUNTER — Other Ambulatory Visit: Payer: Self-pay

## 2020-03-06 ENCOUNTER — Encounter: Payer: Self-pay | Admitting: Orthopaedic Surgery

## 2020-03-06 ENCOUNTER — Ambulatory Visit (INDEPENDENT_AMBULATORY_CARE_PROVIDER_SITE_OTHER): Payer: Medicaid Other | Admitting: Orthopaedic Surgery

## 2020-03-06 VITALS — BP 137/93 | HR 110 | Ht 63.0 in | Wt 210.0 lb

## 2020-03-06 DIAGNOSIS — M7711 Lateral epicondylitis, right elbow: Secondary | ICD-10-CM | POA: Diagnosis not present

## 2020-03-06 MED ORDER — NAPROXEN 500 MG PO TABS: 500.0000 mg | ORAL_TABLET | Freq: Two times a day (BID) | ORAL | 5 refills | Status: DC

## 2020-03-06 NOTE — Patient Instructions (Addendum)
Tennis elbow is easy to get, hard to get rid of try to make sure you do not pull your wrist back when you grip your hand. Lateral Epicondylitis is the medical term, Tennis elbow is most commonly how it is referred.  To make it better you can ice your elbow several times daily then wipe it off and massage the elbow with cream or lotion. This will help with the inflammation it may take months for it to feel better. You have to massage it with firm direct pressure for this to help you.   Tennis Elbow Tennis elbow is swelling (inflammation) in your outer forearm, near your elbow. Swelling affects the tissues that connect muscle to bone (tendons). Tennis elbow can happen in any sport or job in which you use your elbow too much. It is caused by doing the same motion over and over. Tennis elbow can cause:  Pain and tenderness in your forearm and the outer part of your elbow. You may have pain all the time, or only when using the arm.  A burning feeling. This runs from your elbow through your arm.  Weak grip in your hand. Follow these instructions at home: Activity  Rest your elbow and wrist. Avoid activities that cause problems, as told by your doctor.  If told by your doctor, wear an elbow strap to reduce stress on the area.  Do physical therapy exercises as told.  If you lift an object, lift it with your palm facing up. This is easier on your elbow. Lifestyle  If your tennis elbow is caused by sports, check your equipment and make sure that: ? You are using it correctly. ? It fits you well.  If your tennis elbow is caused by work or by using a computer, take breaks often to stretch your arm. Talk with your manager about how you can manage your condition at work. If you have a brace:  Wear the brace as told by your doctor. Remove it only as told by your doctor.  Loosen the brace if your fingers tingle, get numb, or turn cold and blue.  Keep the brace clean.  If the brace is not  waterproof, ask your doctor if you may take the brace off for bathing. If you must keep the brace on while bathing: ? Do not let it get wet. ? Cover it with a watertight covering when you take a bath or a shower. General instructions   If told, put ice on the painful area: ? Put ice in a plastic bag. ? Place a towel between your skin and the bag. ? Leave the ice on for 20 minutes, 2-3 times a day.  Take over-the-counter and prescription medicines only as told by your doctor.  Keep all follow-up visits as told by your doctor. This is important. Contact a doctor if:  Your pain does not get better with treatment.  Your pain gets worse.  You have weakness in your forearm, hand, or fingers.  You cannot feel your forearm, hand, or fingers. Summary  Tennis elbow is swelling (inflammation) in your outer forearm, near your elbow.  Tennis elbow is caused by doing the same motion over and over.  Rest your elbow and wrist. Avoid activities that cause problems, as told by your doctor.  If told, put ice on the painful area for 20 minutes, 2-3 times a day. This information is not intended to replace advice given to you by your health care provider. Make sure you discuss  any questions you have with your health care provider. Document Revised: 03/18/2018 Document Reviewed: 04/06/2017 Elsevier Patient Education  Export.

## 2020-03-06 NOTE — Progress Notes (Signed)
Subjective:    Patient ID: April Ayala, female    DOB: 1976-03-22, 44 y.o.   MRN: [id number]  HPI She has had pain in the right elbow area laterally for a month or two getting worse. She has pain picking up things.  She has pain that is not going away.  She has no trauma, no redness.  She has tried ice, rest, heat, Advil with no help.  She had virtual visit with [facility] recently and I have reviewed the notes.   Review of Systems  Constitutional: Positive for activity change.  Musculoskeletal: Positive for arthralgias.  Allergic/Immunologic: Positive for environmental allergies.  All other systems reviewed and are negative.  For Review of Systems, all other systems reviewed and are negative.  The following is a summary of the past history medically, past history surgically, known current medicines, social history and family history.  This information is gathered electronically by the computer from prior information and documentation.  I review this each visit and have found including this information at this point in the chart is beneficial and informative.   Past Medical History:  Diagnosis Date  . Allergy   . Cervical cancer ([place]) 04/2011   Stage 1B squamous cell  . Chronic left shoulder pain 10/07/2017  . Chronic tension-type headache, not intractable 10/07/2017  . Dysuria 11/22/2019  . History of kidney stones    passed  . S/P lumbar fusion 04/20/2018  . Vaginal discharge 12/18/2019  . Yeast infection 12/18/2019    Past Surgical History:  Procedure Laterality Date  . ABDOMINAL HYSTERECTOMY    . BREAST BIOPSY Right   . CERVICAL CONIZATION W/BX  03/24/2012   Procedure: CONIZATION CERVIX WITH BIOPSY;  Surgeon: Woodroe Mode, MD;  Location: [facility];  Service: Gynecology;  Laterality: N/A;  . DIAGNOSTIC LAPAROSCOPY  2007   ectopic  . ECTOPIC PREGNANCY SURGERY    . ESSURE TUBAL LIGATION  2010  . INSERTION OF SUPRAPUBIC CATHETER  05/03/2012   Procedure:  INSERTION OF SUPRAPUBIC CATHETER;  Surgeon: Alvino Chapel, MD;  Location[facility];  Service: Gynecology;;  . LEEP    . LUMBAR LAMINECTOMY/DECOMPRESSION MICRODISCECTOMY N/A 04/20/2018   Procedure: LUMBAR FIVE TO SACRAL ONE DECOMPRESSION AND FUSION, POSSIBLE LUMBAR FOUR TO SACRAL ONE; SCREW AND CAGE INSTRUMENTATION;  Surgeon: Melina Schools, MD;  Location: [facility];  Service: Orthopedics;  Laterality: N/A;  5 hrs  . LYMPHADENECTOMY  05/03/2012   Procedure: LYMPHADENECTOMY;  Surgeon: Alvino Chapel, MD;  [facility];  Service: Gynecology;  Laterality: Bilateral;  pelvic  . RADICAL HYSTERECTOMY  05/03/2012   Procedure: RADICAL HYSTERECTOMY;  Surgeon: Alvino Chapel, MD;  [facility];  Service: Gynecology;;  transposition of the ovaries  . SPINE SURGERY    . TUBAL LIGATION      Current Outpatient Medications on File Prior to Visit  Medication Sig Dispense Refill  . amitriptyline (ELAVIL) 75 MG tablet Take 1 tablet (75 mg total) by mouth at bedtime. 30 tablet 1  . busPIRone (BUSPAR) 5 MG tablet Take 1 tablet (5 mg total) by mouth 2 (two) times daily. 60 tablet 1  . cyclobenzaprine (FLEXERIL) 10 MG tablet cyclobenzaprine 10 mg tablet  Take 1 tablet 3 times a day by oral route as directed.    . Galcanezumab-gnlm (EMGALITY) 120 MG/ML SOAJ Inject 120 mg into the skin every 30 (thirty) days. 1 mL 10  . levocetirizine (XYZAL) 5 MG tablet Take 1 tablet (5 mg total) by  mouth every evening. 30 tablet 1  . oxyCODONE-acetaminophen (PERCOCET) 10-325 MG tablet oxycodone-acetaminophen 10 mg-325 mg tablet  Take 1 tablet 4 times a day by oral route as needed.    . phentermine (ADIPEX-P) 37.5 MG tablet Take 1 tablet (37.5 mg total) by mouth daily before breakfast. 30 tablet 0  . pregabalin (LYRICA) 75 MG capsule pregabalin 75 mg capsule  TAKE 1 CAPSULE BY MOUTH TWICE DAILY    . rizatriptan (MAXALT-MLT) 10 MG disintegrating tablet Take 1 tablet (10 mg total) by mouth as needed for  migraine. May repeat in 2 hours if needed 9 tablet 11  . rosuvastatin (CRESTOR) 5 MG tablet Take 1 tablet (5 mg total) by mouth daily. 30 tablet 1  . varenicline (CHANTIX) 1 MG tablet Take 1 tablet (1 mg total) by mouth 2 (two) times daily. 60 tablet 0  . Vitamin D, Ergocalciferol, (DRISDOL) 1.25 MG (50000 UNIT) CAPS capsule Take 1 capsule (50,000 Units total) by mouth every 7 (seven) days. 4 capsule 3   No current facility-administered medications on file prior to visit.    Social History   Socioeconomic History  . Marital status: Legally Separated    Spouse name: Not on file  . Number of children: 4  . Years of education: Not on file  . Highest education level: 12th grade  Occupational History  . Occupation: [employer]  Tobacco Use  . Smoking status: Former Smoker    Packs/day: 0.50    Years: 1.00    Pack years: 0.50    Types: Cigarettes    Start date: 11/03/2018  . Smokeless tobacco: Never Used  . Tobacco comment: smoking cessation information given  Vaping Use  . Vaping Use: Never used  Substance and Sexual Activity  . Alcohol use: Never  . Drug use: No  . Sexual activity: Not Currently    Birth control/protection: Surgical    Comment: hyst  Other Topics Concern  . Not on file  Social History Narrative   Lives with fiance and 1 daughter      1 son is with grandmother    Oldest daughter is 61 lives with grandfather      Works as a Aeronautical engineer at [employer].     Lives in [place], [place].      6 dogs, a  beard dragon, a snake, and a hamster and a cat      Enjoys: taking care of her pets      Diet: avoids lunch due to work, eats all food groups   Caffeine: monsters in the [employer]: limited      Wears seat belt    Does not use phone while driving    [place] at home       Right handed   Social Determinants of Health   Financial Resource Strain: [place]   . Difficulty of Paying Living Expenses: Not very hard  Food Insecurity: No Food  Insecurity  . Worried About Charity fundraiser in the Last Year: Never true  . Ran Out of Food in the Last Year: Never true  Transportation Needs: No Transportation Needs  . Lack of Transportation (Medical): No  . Lack of Transportation (Non-Medical): No  Physical Activity: Insufficiently Active  . Days of Exercise per Week: 3 days  . Minutes of Exercise per Session: 30 min  Stress: No Stress Concern Present  . Feeling of Stress : Not at all  Social Connections: Moderately Isolated  . Frequency  of Communication with Friends and Family: Never  . Frequency of Social Gatherings with Friends and Family: Never  . Attends Religious Services: 1 to 4 times per year  . Active Member of Clubs or Organizations: No  . Attends Archivist Meetings: Never  . Marital Status: Living with partner  Intimate Partner Violence: Not At Risk  . Fear of Current or Ex-Partner: No  . Emotionally Abused: No  . Physically Abused: No  . Sexually Abused: No    Family History  Problem Relation Age of Onset  . Diabetes Mother   . Hypertension Father   . Diabetes Sister   . Migraines Sister   . Hypothyroidism Sister   . Migraines Sister   . Migraines Sister     BP (!) 137/93   Pulse (!) 110   Ht 5\' 3"  (1.6 m)   Wt 210 lb (95.3 kg)   LMP 04/11/2012   BMI 37.20 kg/m   Body mass index is 37.2 kg/m.      Objective:   Physical Exam Vitals and nursing note reviewed.  Constitutional:      Appearance: She is well-developed.  HENT:     Head: Normocephalic and atraumatic.  Eyes:     Conjunctiva/sclera: Conjunctivae normal.     Pupils: Pupils are equal, round, and reactive to light.  Cardiovascular:     Rate and Rhythm: Normal rate and regular rhythm.  Pulmonary:     Effort: Pulmonary effort is normal.  Abdominal:     Palpations: Abdomen is soft.  Musculoskeletal:       Arms:     Cervical back: Normal range of motion and neck supple.  Skin:    General: Skin is warm and dry.    Neurological:     Mental Status: She is alert and oriented to person, place, and time.     Cranial Nerves: No cranial nerve deficit.     Motor: No abnormal muscle tone.     Coordination: Coordination normal.     Deep Tendon Reflexes: Reflexes are normal and symmetric. Reflexes normal.  Psychiatric:        Behavior: Behavior normal.        Thought Content: Thought content normal.        Judgment: Judgment normal.           Assessment & Plan:   Encounter Diagnosis  Name Primary?  . Lateral epicondylitis, right elbow Yes   I have explained what tennis elbow is and that it will take time to improve.  I have explained ice massage to her and she should do this three to four times a day.  I will begin Naprosyn 500 po bid pc  Return in three weeks.  Call if any problem.  Precautions discussed.   Electronically Signed Sanjuana Kava, MD 9/1/20213:37 PM

## 2020-03-12 ENCOUNTER — Encounter: Payer: Self-pay | Admitting: Family Medicine

## 2020-03-12 ENCOUNTER — Other Ambulatory Visit: Payer: Self-pay

## 2020-03-12 DIAGNOSIS — M7711 Lateral epicondylitis, right elbow: Secondary | ICD-10-CM

## 2020-03-13 NOTE — Telephone Encounter (Signed)
We received another PA. The last noted time we have of refill was 02/15/20 so this may be an early fill attempt.  I called the pt's pharmacy and was advised they received a call directly from [facility] providing a one month free voucher (I assume this was for her replacement pen she requested) and they have this ready for her to pickup. Pt gets notified from pharmacy via text.

## 2020-03-15 ENCOUNTER — Ambulatory Visit (INDEPENDENT_AMBULATORY_CARE_PROVIDER_SITE_OTHER): Payer: Medicaid Other

## 2020-03-15 ENCOUNTER — Other Ambulatory Visit: Payer: Self-pay

## 2020-03-15 DIAGNOSIS — Z23 Encounter for immunization: Secondary | ICD-10-CM

## 2020-03-20 DIAGNOSIS — M25521 Pain in right elbow: Secondary | ICD-10-CM | POA: Insufficient documentation

## 2020-03-21 DIAGNOSIS — M7711 Lateral epicondylitis, right elbow: Secondary | ICD-10-CM | POA: Diagnosis not present

## 2020-03-23 ENCOUNTER — Other Ambulatory Visit: Payer: Self-pay | Admitting: Family Medicine

## 2020-03-23 ENCOUNTER — Encounter: Payer: Self-pay | Admitting: Family Medicine

## 2020-03-23 DIAGNOSIS — E7841 Elevated Lipoprotein(a): Secondary | ICD-10-CM

## 2020-03-25 ENCOUNTER — Other Ambulatory Visit: Payer: Self-pay | Admitting: *Deleted

## 2020-03-25 DIAGNOSIS — E7841 Elevated Lipoprotein(a): Secondary | ICD-10-CM

## 2020-03-25 MED ORDER — ROSUVASTATIN CALCIUM 5 MG PO TABS: 5.0000 mg | ORAL_TABLET | Freq: Every day | ORAL | 1 refills | Status: DC

## 2020-03-26 ENCOUNTER — Encounter: Payer: Self-pay | Admitting: Family Medicine

## 2020-03-26 ENCOUNTER — Ambulatory Visit: Payer: Medicaid Other | Admitting: Orthopaedic Surgery

## 2020-03-26 NOTE — Telephone Encounter (Signed)
Any recommendations?

## 2020-03-26 NOTE — H&P (Signed)
April Ayala is an 44 y.o. female.   Chief Complaint: Numbness left hand  HPI:  April Ayala was seen several years ago for evaluation of bilateral carpal tunnel syndrome.  She had surgery scheduled for release but decided timing was not right.  She has been having more more trouble to the point of compromise now would like to proceed with surgery.  She had EMGs and nerve conduction studies in 2019 by Dr. 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  That lesion was characterized by sensory and motor demyelinization with evidence of axonal injury.  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    Past Medical History:  Diagnosis Date  . Allergy   . Cervical cancer ([place]) 04/2011   Stage 1B squamous cell  . Chronic left shoulder pain 10/07/2017  . Chronic tension-type headache, not intractable 10/07/2017  . Dysuria 11/22/2019  . History of kidney stones    passed  . S/P lumbar fusion 04/20/2018  . Vaginal discharge 12/18/2019  . Yeast infection 12/18/2019    Past Surgical History:  Procedure Laterality Date  . ABDOMINAL HYSTERECTOMY    . BREAST BIOPSY Right   . CERVICAL CONIZATION W/BX  03/24/2012   Procedure: CONIZATION CERVIX WITH BIOPSY;  Surgeon: Woodroe Mode, MD;  Location: [facility];  Service: Gynecology;  Laterality: N/A;  . DIAGNOSTIC LAPAROSCOPY  2007   ectopic  . ECTOPIC PREGNANCY SURGERY    . ESSURE TUBAL LIGATION  2010  . INSERTION OF SUPRAPUBIC CATHETER  05/03/2012   Procedure: INSERTION OF SUPRAPUBIC CATHETER;  Surgeon: Alvino Chapel, MD;  [facility];  Service: Gynecology;;  . LEEP    . LUMBAR LAMINECTOMY/DECOMPRESSION MICRODISCECTOMY N/A 04/20/2018   Procedure: LUMBAR FIVE TO SACRAL ONE  DECOMPRESSION AND FUSION, POSSIBLE LUMBAR FOUR TO SACRAL ONE; SCREW AND CAGE INSTRUMENTATION;  Surgeon: Melina Schools, MD;  Location: [facility];  Service: Orthopedics;  Laterality: N/A;  5 hrs  . LYMPHADENECTOMY  05/03/2012   Procedure: LYMPHADENECTOMY;  Surgeon: Alvino Chapel, MD;  [facility];  Service: Gynecology;  Laterality: Bilateral;  pelvic  . RADICAL HYSTERECTOMY  05/03/2012   Procedure: RADICAL HYSTERECTOMY;  Surgeon: Alvino Chapel, MD;  [facility];  Service: Gynecology;;  transposition of the ovaries  . SPINE SURGERY    . TUBAL LIGATION      Family History  Problem Relation Age of Onset  . Diabetes Mother   . Hypertension Father   . Diabetes Sister   . Migraines Sister   . Hypothyroidism Sister   . Migraines Sister   . Migraines Sister    Social History:  reports that she has quit smoking. Her smoking use included cigarettes. She started smoking about 16 months ago. She has a 0.50 pack-year smoking history. She has never used smokeless tobacco. She reports that she does not drink alcohol and does not use drugs.  Allergies: No Known Allergies  No medications prior to admission.    No results found for this or any previous visit (from the past 48 hour(s)). No results found.  Review of Systems  Last menstrual period 04/11/2012. Physical Exam Constitutional:      Appearance: Normal appearance.  HENT:     Head: Normocephalic.  Nose: Nose normal.     Mouth/Throat:     Mouth: Mucous membranes are moist.     Pharynx: Oropharynx is clear.  Cardiovascular:     Rate and Rhythm: Normal rate and regular rhythm.     Pulses: Normal pulses.  Pulmonary:     Effort: Pulmonary effort is normal.     Breath sounds: Normal breath sounds.  Abdominal:     General: Abdomen is flat. Bowel sounds are normal.  Skin:    General: Skin is warm and dry.     Capillary Refill: Capillary refill takes less than 2 seconds.  Neurological:     Mental Status:  She is alert and oriented to person, place, and time.  Psychiatric:        Behavior: Behavior normal.   Good opposition of thumb to little finger bilaterally without obvious thenar atrophy.  Does have some persistent numbness in the left long finger.  Good capillary refill.  Positive Phalen's and Tinel's bilaterally.    Assessment/Plan  Clinical impression:  left carpal tunnel syndrome  Recommendation:  Left carpal tunnel release    Biagio Borg, PA-C 03/26/2020, 1:44 PM

## 2020-03-27 ENCOUNTER — Encounter (HOSPITAL_BASED_OUTPATIENT_CLINIC_OR_DEPARTMENT_OTHER): Payer: Self-pay | Admitting: Orthopaedic Surgery

## 2020-03-27 ENCOUNTER — Ambulatory Visit: Payer: Medicaid Other | Admitting: Orthopedic Surgery

## 2020-03-27 ENCOUNTER — Other Ambulatory Visit: Payer: Self-pay

## 2020-03-28 ENCOUNTER — Other Ambulatory Visit: Payer: Self-pay

## 2020-03-29 ENCOUNTER — Other Ambulatory Visit (HOSPITAL_COMMUNITY): Payer: Medicaid Other

## 2020-04-01 NOTE — Progress Notes (Signed)
Spoke with patient to remind her to get covid test for surgery scheduled for tomorrow. Patient sates she needs to reschedule surgery because her daughter is ill. Advised patient to call Dr Rudene Anda office to make them aware of need to reschedule. I called Dr Rudene Anda office and left VM for Debbie of patient's intent to reschedule.

## 2020-04-02 ENCOUNTER — Ambulatory Visit (HOSPITAL_BASED_OUTPATIENT_CLINIC_OR_DEPARTMENT_OTHER): Admission: RE | Admit: 2020-04-02 | Payer: Medicaid Other | Source: Home / Self Care | Admitting: Orthopaedic Surgery

## 2020-04-02 HISTORY — DX: Anxiety disorder, unspecified: F41.9

## 2020-04-02 HISTORY — DX: Depression, unspecified: F32.A

## 2020-04-02 SURGERY — CARPAL TUNNEL RELEASE
Anesthesia: Choice | Site: Wrist | Laterality: Left

## 2020-04-02 MED ORDER — MIDAZOLAM HCL 2 MG/2ML IJ SOLN
INTRAMUSCULAR | Status: AC
  Filled 2020-04-02: qty 2

## 2020-04-02 MED ORDER — FENTANYL CITRATE (PF) 100 MCG/2ML IJ SOLN
INTRAMUSCULAR | Status: AC
  Filled 2020-04-02: qty 2

## 2020-04-04 ENCOUNTER — Encounter: Payer: Self-pay | Admitting: *Deleted

## 2020-04-04 NOTE — Telephone Encounter (Signed)
We received another PA for [employer]. I completed this on Cover My Meds. Key: [id number]. Received immediate approval by [employer] Plan.   PA Case: [id number], Status: Approved, Coverage Starts on: 04/04/2020 12:00:00 AM, Coverage Ends on: 07/03/2020 12:00:00 AM.  Pharmacy was notified via fax. Received a receipt of confirmation.

## 2020-04-04 NOTE — Progress Notes (Signed)
Error

## 2020-04-10 ENCOUNTER — Encounter: Payer: Self-pay | Admitting: Family Medicine

## 2020-04-11 ENCOUNTER — Telehealth: Payer: Medicaid Other | Admitting: Family Medicine

## 2020-04-11 ENCOUNTER — Other Ambulatory Visit: Payer: Self-pay

## 2020-04-11 ENCOUNTER — Encounter: Payer: Self-pay | Admitting: Family Medicine

## 2020-04-11 VITALS — BP 137/93 | Ht 63.0 in | Wt 210.0 lb

## 2020-04-11 DIAGNOSIS — B001 Herpesviral vesicular dermatitis: Secondary | ICD-10-CM | POA: Insufficient documentation

## 2020-04-11 HISTORY — DX: Herpesviral vesicular dermatitis: B00.1

## 2020-04-11 MED ORDER — VALACYCLOVIR HCL 1 G PO TABS: 1000.0000 mg | ORAL_TABLET | Freq: Two times a day (BID) | ORAL | 0 refills | Status: AC

## 2020-04-11 NOTE — Assessment & Plan Note (Signed)
Reports that she has had several cold sores here recently.  She tried all over-the-counter medications without much success.  We'll do a course of Valtrex to see if this is helpful.  Advised for her to call us back if treatment is failed and there is no relief.  Reviewed side effects, risks and benefits of medication.   Patient acknowledged agreement and understanding of the plan.

## 2020-04-11 NOTE — Patient Instructions (Signed)
  HAPPY FALL!  I appreciate the opportunity to provide you with care for your health and wellness. Today we discussed: Cold sore  Follow up: As scheduled  No labs or referrals today  Take medication as directed and please call if they do not heal or if they return quickly.  Please continue to practice social distancing to keep you, your family, and our community safe.  If you must go out, please wear a mask and practice good handwashing.  It was a pleasure to see you and I look forward to continuing to work together on your health and well-being. Please do not hesitate to call the office if you need care or have questions about your care.  Have a wonderful day and week. With Gratitude, Cherly Beach, DNP, AGNP-BC

## 2020-04-11 NOTE — Progress Notes (Signed)
Virtual Visit via Telephone Note   This visit type was conducted due to national recommendations for restrictions regarding the COVID-19 Pandemic (e.g. social distancing) in an effort to limit this patient's exposure and mitigate transmission in our community.  Due to her co-morbid illnesses, this patient is at least at moderate risk for complications without adequate follow up.  This format is felt to be most appropriate for this patient at this time.  The patient did not have access to video technology/had technical difficulties with video requiring transitioning to audio format only (telephone).  All issues noted in this document were discussed and addressed.  No physical exam could be performed with this format.    Evaluation Performed:  Follow-up visit  Date:  04/11/2020   ID:  April Ayala, DOB March 01, 1976, MRN [id number]  Patient Location: Home Provider Location: Office/Clinic  Location of Patient: Home Location of Provider: Telehealth Consent was obtain for visit to be over via telehealth. I verified that I am speaking with the correct person using two identifiers.  PCP:  Perlie Mayo, NP   Chief Complaint: Cold sore  History of Present Illness:    April Ayala is a 44 y.o. female with history as stated below.  She reports that she has had multiple cold sores popping up in the corners of her mouth to the tops of her lips to the bottoms of her lips.  She reports will start healing in one area and then pop up in another area.  She is thought it was because she is wearing a mask more frequently.  But wasn't sure if that was the cause.  If started become more problematic as they are taking the time to heal and once when heels and abdomen pops up.  She has tried several over-the-counter treatment measures such as Abreva without much relief.  She denies having any other issues or concerns today.  The patient does not have symptoms concerning for COVID-19 infection  (fever, chills, cough, or new shortness of breath).   Past Medical, Surgical, Social History, Allergies, and Medications have been Reviewed.  Past Medical History:  Diagnosis Date  . Anxiety   . Cervical cancer ([place]) 04/2011   Stage 1B squamous cell  . Chronic left shoulder pain 10/07/2017  . Chronic tension-type headache, not intractable 10/07/2017  . Depression   . History of kidney stones    passed  . S/P lumbar fusion 04/20/2018   Past Surgical History:  Procedure Laterality Date  . ABDOMINAL HYSTERECTOMY    . BREAST BIOPSY Right   . CERVICAL CONIZATION W/BX  03/24/2012   Procedure: CONIZATION CERVIX WITH BIOPSY;  Surgeon: Woodroe Mode, MD;  Location: [facility];  Service: Gynecology;  Laterality: N/A;  . DIAGNOSTIC LAPAROSCOPY  2007   ectopic  . ECTOPIC PREGNANCY SURGERY    . ESSURE TUBAL LIGATION  2010  . INSERTION OF SUPRAPUBIC CATHETER  05/03/2012   Procedure: INSERTION OF SUPRAPUBIC CATHETER;  Surgeon: Alvino Chapel, MD;  [facility];  Service: Gynecology;;  . LEEP    . LUMBAR LAMINECTOMY/DECOMPRESSION MICRODISCECTOMY N/A 04/20/2018   Procedure: LUMBAR FIVE TO SACRAL ONE DECOMPRESSION AND FUSION, POSSIBLE LUMBAR FOUR TO SACRAL ONE; SCREW AND CAGE INSTRUMENTATION;  Surgeon: Melina Schools, MD;  Location: [facility];  Service: Orthopedics;  Laterality: N/A;  5 hrs  . LYMPHADENECTOMY  05/03/2012   Procedure: LYMPHADENECTOMY;  Surgeon: Alvino Chapel, MD;  [facility];  Service: Gynecology;  Laterality: Bilateral;  pelvic  . RADICAL HYSTERECTOMY  05/03/2012   Procedure: RADICAL HYSTERECTOMY;  Surgeon: Alvino Chapel, MD;  [facility];  Service: Gynecology;;  transposition of the ovaries  . SPINE SURGERY    . TUBAL LIGATION       Current Meds  Medication Sig  . amitriptyline (ELAVIL) 75 MG tablet Take 1 tablet (75 mg total) by mouth at bedtime.  . busPIRone (BUSPAR) 5 MG tablet Take 1 tablet (5 mg total) by mouth 2 (two) times daily.  .  cyclobenzaprine (FLEXERIL) 10 MG tablet cyclobenzaprine 10 mg tablet  Take 1 tablet 3 times a day by oral route as directed.  . Galcanezumab-gnlm (EMGALITY) 120 MG/ML SOAJ Inject 120 mg into the skin every 30 (thirty) days.  Marland Kitchen levocetirizine (XYZAL) 5 MG tablet Take 1 tablet (5 mg total) by mouth every evening.  . naproxen (NAPROSYN) 500 MG tablet Take 1 tablet (500 mg total) by mouth 2 (two) times daily with a meal.  . oxyCODONE-acetaminophen (PERCOCET) 10-325 MG tablet oxycodone-acetaminophen 10 mg-325 mg tablet  Take 1 tablet 4 times a day by oral route as needed.  . pregabalin (LYRICA) 75 MG capsule pregabalin 75 mg capsule  TAKE 1 CAPSULE BY MOUTH TWICE DAILY  . rizatriptan (MAXALT) 10 MG tablet Take 10 mg by mouth 2 (two) times daily as needed.  . rizatriptan (MAXALT-MLT) 10 MG disintegrating tablet Take 1 tablet (10 mg total) by mouth as needed for migraine. May repeat in 2 hours if needed  . rosuvastatin (CRESTOR) 5 MG tablet Take 1 tablet (5 mg total) by mouth daily.  . varenicline (CHANTIX) 1 MG tablet Take 1 tablet (1 mg total) by mouth 2 (two) times daily.  . Vitamin D, Ergocalciferol, (DRISDOL) 1.25 MG (50000 UNIT) CAPS capsule Take 1 capsule (50,000 Units total) by mouth every 7 (seven) days.     Allergies:   Patient has no known allergies.   ROS:   Please see the history of present illness.    All other systems reviewed and are negative.   Labs/Other Tests and Data Reviewed:    Recent Labs: 09/29/2019: ALT 21; BUN 15; Creat 0.74; Hemoglobin 16.0; Platelets 329; Potassium 4.5; Sodium 138; TSH 2.99   Recent Lipid Panel Lab Results  Component Value Date/Time   CHOL 189 09/29/2019 11:05 AM   TRIG 126 09/29/2019 11:05 AM   HDL 43 (L) 09/29/2019 11:05 AM   CHOLHDL 4.4 09/29/2019 11:05 AM   LDLCALC 122 (H) 09/29/2019 11:05 AM    Wt Readings from Last 3 Encounters:  04/11/20 210 lb (95.3 kg)  03/06/20 210 lb (95.3 kg)  02/21/20 213 lb (96.6 kg)     Objective:     Vital Signs:  BP (!) 137/93   Ht 5\' 3"  (1.6 m)   Wt 210 lb (95.3 kg)   LMP 04/11/2012   BMI 37.20 kg/m    VITAL SIGNS:  reviewed GEN:  no acute distress RESPIRATORY:  No shortness of breath noted in conversation PSYCH:  Normal affect and mood  ASSESSMENT & PLAN:    1. Cold sore  - valACYclovir (VALTREX) 1000 MG tablet; Take 1 tablet (1,000 mg total) by mouth 2 (two) times daily for 7 days.  Dispense: 14 tablet; Refill: 0   Time:   Today, I have spent 5 minutes with the patient with telehealth technology discussing the above problems.     Medication Adjustments/Labs and Tests Ordered: Current medicines are reviewed at length with the patient today.  Concerns regarding  medicines are outlined above.   Tests Ordered: No orders of the defined types were placed in this encounter.   Medication Changes: No orders of the defined types were placed in this encounter.   Disposition:  Follow up 09/17/2020 Signed, Perlie Mayo, NP  04/11/2020 4:28 PM     [facility]

## 2020-04-19 ENCOUNTER — Encounter: Payer: Self-pay | Admitting: Family Medicine

## 2020-04-23 ENCOUNTER — Other Ambulatory Visit: Payer: Self-pay

## 2020-04-23 ENCOUNTER — Encounter: Payer: Self-pay | Admitting: Family Medicine

## 2020-04-23 ENCOUNTER — Telehealth (INDEPENDENT_AMBULATORY_CARE_PROVIDER_SITE_OTHER): Payer: Medicaid Other | Admitting: Family Medicine

## 2020-04-23 VITALS — BP 137/93 | Ht 63.0 in | Wt 210.0 lb

## 2020-04-23 DIAGNOSIS — K146 Glossodynia: Secondary | ICD-10-CM | POA: Diagnosis not present

## 2020-04-23 DIAGNOSIS — B001 Herpesviral vesicular dermatitis: Secondary | ICD-10-CM | POA: Diagnosis not present

## 2020-04-23 HISTORY — DX: Glossodynia: K14.6

## 2020-04-23 NOTE — Patient Instructions (Signed)
  HAPPY FALL!  I appreciate the opportunity to provide you with care for your health and wellness. Today we discussed: cold sores  Follow up: as scheduled  Labs - at [facility] No referrals today  Pending labs we will see which medication might be best for you.  Please toss toothbrush, lip products, or other items used on face recently  Please wash pillowcases and towels daily.  Please continue to practice social distancing to keep you, your family, and our community safe.  If you must go out, please wear a mask and practice good handwashing.  It was a pleasure to see you and I look forward to continuing to work together on your health and well-being. Please do not hesitate to call the office if you need care or have questions about your care.  Have a wonderful day and week. With Gratitude, Cherly Beach, DNP, AGNP-BC

## 2020-04-23 NOTE — Assessment & Plan Note (Signed)
Checking B12 def

## 2020-04-23 NOTE — Progress Notes (Signed)
Virtual Visit via Telephone Note   This visit type was conducted due to national recommendations for restrictions regarding the COVID-19 Pandemic (e.g. social distancing) in an effort to limit this patient's exposure and mitigate transmission in our community.  Due to her co-morbid illnesses, this patient is at least at moderate risk for complications without adequate follow up.  This format is felt to be most appropriate for this patient at this time.  The patient did not have access to video technology/had technical difficulties with video requiring transitioning to audio format only (telephone).  All issues noted in this document were discussed and addressed.  No physical exam could be performed with this format.  Evaluation Performed:  Follow-up visit  Date:  04/23/2020   ID:  April Ayala, DOB 1976/02/27, MRN [id number]  Patient Location: Home Provider Location: Office/Clinic  Location of Patient: Home Location of Provider: Telehealth Consent was obtain for visit to be over via telehealth. I verified that I am speaking with the correct person using two identifiers.  PCP:  Perlie Mayo, NP   Chief Complaint:    History of Present Illness:    April Ayala is a 44 y.o. female with history as stated below. Back due to treatment failure of possible cold sores. She reported on 04/11/2020 that she has had multiple cold sores popping up in the corners of her mouth to the tops of her lips to the bottoms of her lips.  She reported will start healing in one area and then pop up in another area.  She is thought it was because she is wearing a mask more frequently. She had tried OTC treatment with out relief. She reports taking the valacyclovir as directed.  Reports some of it seemed to be getting better but then it started to get worse again.  She reports now the corners of her mouth are staying where they are.  And sometimes her tongue gets sore. She denies having any other  issues or concerns today.  The patient does not have symptoms concerning for COVID-19 infection (fever, chills, cough, or new shortness of breath).   Past Medical, Surgical, Social History, Allergies, and Medications have been Reviewed.  Past Medical History:  Diagnosis Date  . Anxiety   . Cervical cancer ([place]) 04/2011   Stage 1B squamous cell  . Chronic left shoulder pain 10/07/2017  . Chronic tension-type headache, not intractable 10/07/2017  . Depression   . History of kidney stones    passed  . S/P lumbar fusion 04/20/2018   Past Surgical History:  Procedure Laterality Date  . ABDOMINAL HYSTERECTOMY    . BREAST BIOPSY Right   . CERVICAL CONIZATION W/BX  03/24/2012   Procedure: CONIZATION CERVIX WITH BIOPSY;  Surgeon: Woodroe Mode, MD;  Location: [facility];  Service: Gynecology;  Laterality: N/A;  . DIAGNOSTIC LAPAROSCOPY  2007   ectopic  . ECTOPIC PREGNANCY SURGERY    . ESSURE TUBAL LIGATION  2010  . INSERTION OF SUPRAPUBIC CATHETER  05/03/2012   Procedure: INSERTION OF SUPRAPUBIC CATHETER;  Surgeon: Alvino Chapel, MD;  Location: [facility];  Service: Gynecology;;  . LEEP    . LUMBAR LAMINECTOMY/DECOMPRESSION MICRODISCECTOMY N/A 04/20/2018   Procedure: LUMBAR FIVE TO SACRAL ONE DECOMPRESSION AND FUSION, POSSIBLE LUMBAR FOUR TO SACRAL ONE; SCREW AND CAGE INSTRUMENTATION;  Surgeon: Melina Schools, MD;  Location: [facility];  Service: Orthopedics;  Laterality: N/A;  5 hrs  . LYMPHADENECTOMY  05/03/2012   Procedure: LYMPHADENECTOMY;  Surgeon: Alvino Chapel, MD;  Location: [facility];  Service: Gynecology;  Laterality: Bilateral;  pelvic  . RADICAL HYSTERECTOMY  05/03/2012   Procedure: RADICAL HYSTERECTOMY;  Surgeon: Alvino Chapel, MD;  Location: [facility];  Service: Gynecology;;  transposition of the ovaries  . SPINE SURGERY    . TUBAL LIGATION       Current Meds  Medication Sig  . amitriptyline (ELAVIL) 75 MG tablet Take 1 tablet (75 mg total) by mouth at bedtime.   . busPIRone (BUSPAR) 5 MG tablet Take 1 tablet (5 mg total) by mouth 2 (two) times daily.  . cyclobenzaprine (FLEXERIL) 10 MG tablet cyclobenzaprine 10 mg tablet  Take 1 tablet 3 times a day by oral route as directed.  . Galcanezumab-gnlm (EMGALITY) 120 MG/ML SOAJ Inject 120 mg into the skin every 30 (thirty) days.  Marland Kitchen levocetirizine (XYZAL) 5 MG tablet Take 1 tablet (5 mg total) by mouth every evening.  . naproxen (NAPROSYN) 500 MG tablet Take 1 tablet (500 mg total) by mouth 2 (two) times daily with a meal.  . oxyCODONE-acetaminophen (PERCOCET) 10-325 MG tablet oxycodone-acetaminophen 10 mg-325 mg tablet  Take 1 tablet 4 times a day by oral route as needed.  . pregabalin (LYRICA) 75 MG capsule pregabalin 75 mg capsule  TAKE 1 CAPSULE BY MOUTH TWICE DAILY  . rizatriptan (MAXALT) 10 MG tablet Take 10 mg by mouth 2 (two) times daily as needed.  . rizatriptan (MAXALT-MLT) 10 MG disintegrating tablet Take 1 tablet (10 mg total) by mouth as needed for migraine. May repeat in 2 hours if needed  . rosuvastatin (CRESTOR) 5 MG tablet Take 1 tablet (5 mg total) by mouth daily.  . varenicline (CHANTIX) 1 MG tablet Take 1 tablet (1 mg total) by mouth 2 (two) times daily.  . Vitamin D, Ergocalciferol, (DRISDOL) 1.25 MG (50000 UNIT) CAPS capsule Take 1 capsule (50,000 Units total) by mouth every 7 (seven) days.     Allergies:   Patient has no known allergies.   ROS:   Please see the history of present illness.    All other systems reviewed and are negative.   Labs/Other Tests and Data Reviewed:    Recent Labs: 09/29/2019: ALT 21; BUN 15; Creat 0.74; Hemoglobin 16.0; Platelets 329; Potassium 4.5; Sodium 138; TSH 2.99   Recent Lipid Panel Lab Results  Component Value Date/Time   CHOL 189 09/29/2019 11:05 AM   TRIG 126 09/29/2019 11:05 AM   HDL 43 (L) 09/29/2019 11:05 AM   CHOLHDL 4.4 09/29/2019 11:05 AM   LDLCALC 122 (H) 09/29/2019 11:05 AM    Wt Readings from Last 3 Encounters:  04/23/20  210 lb (95.3 kg)  04/11/20 210 lb (95.3 kg)  03/06/20 210 lb (95.3 kg)     Objective:    Vital Signs:  BP (!) 137/93   Ht 5\' 3"  (1.6 m)   Wt 210 lb (95.3 kg)   LMP 04/11/2012   BMI 37.20 kg/m    VITAL SIGNS:  reviewed GEN:  no acute distress RESPIRATORY:  no shortness of breath noted in conversation  PSYCH:  normal affect  ASSESSMENT & PLAN:    1. Cold sore - Comprehensive metabolic panel - CBC with Differential/Platelet  2. Soreness of tongue  - Vitamin B12  Time:   Today, I have spent 5 minutes with the patient with telehealth technology discussing the above problems.     Medication Adjustments/Labs and Tests Ordered: Current medicines are reviewed at length with the patient today.  Concerns regarding medicines are outlined above.   Tests Ordered: No orders of the defined types were placed in this encounter.   Medication Changes: No orders of the defined types were placed in this encounter.    Note: This dictation was prepared with Dragon dictation along with smaller phrase technology. Similar sounding words can be transcribed inadequately or may not be corrected upon review. Any transcriptional errors that result from this process are unintentional.      Disposition:  Follow up 09/17/2020 Signed, Perlie Mayo, NP  04/23/2020 3:40 PM     [facility]

## 2020-04-23 NOTE — Assessment & Plan Note (Signed)
Will get labs-pending WBC will decide if anti viral vs antibiotic for impetigo is needed. Also could be B12 def- will be checking this as well.

## 2020-04-23 NOTE — Telephone Encounter (Signed)
Pt made an appt with Jarrett Soho today 04-23-20

## 2020-04-25 ENCOUNTER — Other Ambulatory Visit: Payer: Self-pay | Admitting: Family Medicine

## 2020-04-25 DIAGNOSIS — B001 Herpesviral vesicular dermatitis: Secondary | ICD-10-CM

## 2020-04-25 LAB — COMPLETE METABOLIC PANEL WITH GFR
AG Ratio: 2.1 (calc) (ref 1.0–2.5)
ALT: 22 U/L (ref 6–29)
AST: 25 U/L (ref 10–30)
Albumin: 4.1 g/dL (ref 3.6–5.1)
Alkaline phosphatase (APISO): 68 U/L (ref 31–125)
BUN: 17 mg/dL (ref 7–25)
CO2: 31 mmol/L (ref 20–32)
Calcium: 9.5 mg/dL (ref 8.6–10.2)
Chloride: 102 mmol/L (ref 98–110)
Creat: 0.97 mg/dL (ref 0.50–1.10)
GFR, Est African American: 83 mL/min/{1.73_m2} (ref >=60)
GFR, Est Non African American: 72 mL/min/{1.73_m2} (ref >=60)
Globulin: 2 g/dL (calc) (ref 1.9–3.7)
Glucose, Bld: 104 mg/dL (ref 65–139)
Potassium: 4.3 mmol/L (ref 3.5–5.3)
Sodium: 140 mmol/L (ref 135–146)
Total Bilirubin: 0.2 mg/dL (ref 0.2–1.2)
Total Protein: 6.1 g/dL (ref 6.1–8.1)

## 2020-04-25 LAB — CBC
HCT: 43.5 % (ref 35.0–45.0)
Hemoglobin: 14.4 g/dL (ref 11.7–15.5)
MCH: 29.9 pg (ref 27.0–33.0)
MCHC: 33.1 g/dL (ref 32.0–36.0)
MCV: 90.2 fL (ref 80.0–100.0)
MPV: 10.8 fL (ref 7.5–12.5)
Platelets: 334 10*3/uL (ref 140–400)
RBC: 4.82 10*6/uL (ref 3.80–5.10)
RDW: 13.8 % (ref 11.0–15.0)
WBC: 8.9 10*3/uL (ref 3.8–10.8)

## 2020-04-25 LAB — VITAMIN B12: Vitamin B-12: 587 pg/mL (ref 200–1100)

## 2020-04-25 MED ORDER — VALACYCLOVIR HCL 1 G PO TABS: 1000.0000 mg | ORAL_TABLET | Freq: Two times a day (BID) | ORAL | 0 refills | Status: AC

## 2020-05-08 ENCOUNTER — Telehealth: Payer: Self-pay | Admitting: Neurology

## 2020-05-08 ENCOUNTER — Telehealth (INDEPENDENT_AMBULATORY_CARE_PROVIDER_SITE_OTHER): Payer: Medicaid Other | Admitting: Adult Health

## 2020-05-08 DIAGNOSIS — G43709 Chronic migraine without aura, not intractable, without status migrainosus: Secondary | ICD-10-CM

## 2020-05-08 NOTE — Telephone Encounter (Signed)
Noted  

## 2020-05-08 NOTE — Telephone Encounter (Signed)
..   Pt understands that although there may be some limitations with this type of visit, we will take all precautions to reduce any security or privacy concerns.  Pt understands that this will be treated like an in office visit and we will file with pt's insurance, and there may be a patient responsible charge related to this service. ? ?

## 2020-05-08 NOTE — Progress Notes (Addendum)
PATIENT: April Ayala DOB: 02-05-1976  REASON FOR VISIT: follow up HISTORY FROM: patient  Virtual Visit via Video Note  I connected with April Ayala on 05/08/20 at  2:00 PM EDT by a video enabled telemedicine application located remotely at [facility] and verified that I am speaking with the correct person using two identifiers who was located at their own home.   I discussed the limitations of evaluation and management by telemedicine and the availability of in person appointments. The patient expressed understanding and agreed to proceed.   PATIENT: April Ayala DOB: 11/23/1975  REASON FOR VISIT: follow up HISTORY FROM: patient  HISTORY OF PRESENT ILLNESS: Today 05/08/20:  [name] is a 44 year old female with a history of migraine headaches.  She returns today for follow-up.  She reports that her headaches have been under great control with Emgality.  She may have 2 headaches a month.  Typically she finds good benefit with Maxalt.  On occasion she will have to repeat her dose of Maxalt for persistent headache.  Overall she is very pleased with her migraine management.  HISTORY April Ayala is a 44 y.o. female here as requested by Perlie Mayo, NP for Carpal Tunnel and Migraines, past medical history of lumbar fusion, kidney stones, chronic headaches, chronic left shoulder pain, migraines, carpal tunnel bilaterally, fatigue, tobacco use, obesity.  I reviewed April Ayala notes, she has been managed for her migraines, she was tried on Topamax with little success, she is on amitriptyline now which has helped, she is in an active phase of weight loss.   Patient is here alone: She has had migraines for 4 years, all her sisters have migraine, no inciting event, stress can trigger and make them worse, she wakes up with headaches every day, she has CTS and her hands hurt, numbness and tingling, she had an emg and she was supposed to  have surgery, she deny any weakness or snoring, no excessive daytime sleepiness. She has daily headaches for over a year, she has sharp pains behind the left head, headaches are pounding/pulsating, she has to go into a dark room, sound bothers her and makes it wore, amitriptyline helps, she has had a hysterectomy, she takes percocet 3x a day and so take a laxative but has no constipation due to treatment, nausea and vomiting. The headaches are worse when laying down and she can barely get to sleep. No aura. She Is still smoking.No other focal neurologic deficits, associated symptoms, inciting events or modifiable factors.  09/2019: Hgba1c,tsh,cmp normal reviewed  Reviewed notes, labs and imaging from outside physicians, which showed: reviewed EMG/NCS report from 2019, data and agree with the following:  From a thorough review of records, medications tried that can be used in migraine management include : Topamax, amitriptyline, flexeril, lyrica, maxalt, tylenol, baclofen, fioricet, neurontin, indomethacin, ibuprofen, toradol, robaxin, meoprolol, naproxen, zofran, phenergan, maxalt, trmadol   REVIEW OF SYSTEMS: Out of a complete 14 system review of symptoms, the patient complains only of the following symptoms, and all other reviewed systems are negative.  See HPI  ALLERGIES: No Known Allergies  HOME MEDICATIONS: Outpatient Medications Prior to Visit  Medication Sig Dispense Refill  . amitriptyline (ELAVIL) 75 MG tablet Take 1 tablet (75 mg total) by mouth at bedtime. 30 tablet 1  . busPIRone (BUSPAR) 5 MG tablet Take 1 tablet (5 mg total) by mouth 2 (two) times daily. 60 tablet 1  . cyclobenzaprine (FLEXERIL) 10 MG tablet  cyclobenzaprine 10 mg tablet  Take 1 tablet 3 times a day by oral route as directed.    . Galcanezumab-gnlm (EMGALITY) 120 MG/ML SOAJ Inject 120 mg into the skin every 30 (thirty) days. 1 mL 10  . levocetirizine (XYZAL) 5 MG tablet Take 1 tablet (5 mg total) by mouth  every evening. 30 tablet 1  . naproxen (NAPROSYN) 500 MG tablet Take 1 tablet (500 mg total) by mouth 2 (two) times daily with a meal. 60 tablet 5  . oxyCODONE-acetaminophen (PERCOCET) 10-325 MG tablet oxycodone-acetaminophen 10 mg-325 mg tablet  Take 1 tablet 4 times a day by oral route as needed.    . pregabalin (LYRICA) 75 MG capsule pregabalin 75 mg capsule  TAKE 1 CAPSULE BY MOUTH TWICE DAILY    . rizatriptan (MAXALT) 10 MG tablet Take 10 mg by mouth 2 (two) times daily as needed.    . rizatriptan (MAXALT-MLT) 10 MG disintegrating tablet Take 1 tablet (10 mg total) by mouth as needed for migraine. May repeat in 2 hours if needed 9 tablet 11  . rosuvastatin (CRESTOR) 5 MG tablet Take 1 tablet (5 mg total) by mouth daily. 30 tablet 1  . Vitamin D, Ergocalciferol, (DRISDOL) 1.25 MG (50000 UNIT) CAPS capsule Take 1 capsule (50,000 Units total) by mouth every 7 (seven) days. 4 capsule 3   No facility-administered medications prior to visit.    PAST MEDICAL HISTORY: Past Medical History:  Diagnosis Date  . Anxiety   . Cervical cancer ([place]) 04/2011   Stage 1B squamous cell  . Chronic left shoulder pain 10/07/2017  . Chronic tension-type headache, not intractable 10/07/2017  . Depression   . History of kidney stones    passed  . S/P lumbar fusion 04/20/2018    PAST SURGICAL HISTORY: Past Surgical History:  Procedure Laterality Date  . ABDOMINAL HYSTERECTOMY    . BREAST BIOPSY Right   . CERVICAL CONIZATION W/BX  03/24/2012   Procedure: CONIZATION CERVIX WITH BIOPSY;  Surgeon: Woodroe Mode, MD;  Location: [facility];  Service: Gynecology;  Laterality: N/A;  . DIAGNOSTIC LAPAROSCOPY  2007   ectopic  . ECTOPIC PREGNANCY SURGERY    . ESSURE TUBAL LIGATION  2010  . INSERTION OF SUPRAPUBIC CATHETER  05/03/2012   Procedure: INSERTION OF SUPRAPUBIC CATHETER;  Surgeon: Alvino Chapel, MD;  Location: [facility];  Service: Gynecology;;  . LEEP    . LUMBAR LAMINECTOMY/DECOMPRESSION  MICRODISCECTOMY N/A 04/20/2018   Procedure: LUMBAR FIVE TO SACRAL ONE DECOMPRESSION AND FUSION, POSSIBLE LUMBAR FOUR TO SACRAL ONE; SCREW AND CAGE INSTRUMENTATION;  Surgeon: Melina Schools, MD;  Location: [facility];  Service: Orthopedics;  Laterality: N/A;  5 hrs  . LYMPHADENECTOMY  05/03/2012   Procedure: LYMPHADENECTOMY;  Surgeon: Alvino Chapel, MD;  Location: [facility];  Service: Gynecology;  Laterality: Bilateral;  pelvic  . RADICAL HYSTERECTOMY  05/03/2012   Procedure: RADICAL HYSTERECTOMY;  Surgeon: Alvino Chapel, MD;  Location: [facility];  Service: Gynecology;;  transposition of the ovaries  . SPINE SURGERY    . TUBAL LIGATION      FAMILY HISTORY: Family History  Problem Relation Age of Onset  . Diabetes Mother   . Hypertension Father   . Diabetes Sister   . Migraines Sister   . Hypothyroidism Sister   . Migraines Sister   . Migraines Sister     SOCIAL HISTORY: Social History   Socioeconomic History  . Marital status: Significant Other    Spouse name: Not on file  .  Number of children: 4  . Years of education: Not on file  . Highest education level: 12th grade  Occupational History  . Occupation: [employer]  Tobacco Use  . Smoking status: Former Smoker    Packs/day: 0.50    Years: 1.00    Pack years: 0.50    Types: Cigarettes    Start date: 11/03/2018  . Smokeless tobacco: Never Used  . Tobacco comment: smoking cessation information given  Vaping Use  . Vaping Use: Never used  Substance and Sexual Activity  . Alcohol use: Never  . Drug use: No  . Sexual activity: Not Currently    Birth control/protection: Surgical    Comment: hyst  Other Topics Concern  . Not on file  Social History Narrative   Lives with fiance and 1 daughter      1 son is with grandmother    Oldest daughter is 42 lives with grandfather      Works as a Aeronautical engineer at [employer].     Lives in [place], [place].      6 dogs, a  beard dragon, a snake, and a hamster and a cat        Enjoys: taking care of her pets      Diet: avoids lunch due to work, eats all food groups   Caffeine: monsters in the [employer]: limited      Wears seat belt    Does not use phone while driving    [place] at home       Right handed   Social Determinants of Health   Financial Resource Strain: [place]   . Difficulty of Paying Living Expenses: Not very hard  Food Insecurity: No Food Insecurity  . Worried About Charity fundraiser in the Last Year: Never true  . Ran Out of Food in the Last Year: Never true  Transportation Needs: No Transportation Needs  . Lack of Transportation (Medical): No  . Lack of Transportation (Non-Medical): No  Physical Activity: Insufficiently Active  . Days of Exercise per Week: 3 days  . Minutes of Exercise per Session: 30 min  Stress: No Stress Concern Present  . Feeling of Stress : Not at all  Social Connections: Moderately Isolated  . Frequency of Communication with Friends and Family: Never  . Frequency of Social Gatherings with Friends and Family: Never  . Attends Religious Services: 1 to 4 times per year  . Active Member of Clubs or Organizations: No  . Attends Archivist Meetings: Never  . Marital Status: Living with partner  Intimate Partner Violence: Not At Risk  . Fear of Current or Ex-Partner: No  . Emotionally Abused: No  . Physically Abused: No  . Sexually Abused: No      PHYSICAL EXAM Generalized: Well developed, in no acute distress   Neurological examination  Mentation: Alert oriented to time, place, history taking. Follows all commands speech and language fluent Cranial nerve II-XII:Extraocular movements were full. Facial symmetry noted. uvula tongue midline. Head turning and shoulder shrug  were normal and symmetric. Motor: Good strength throughout subjectively per patient Sensory: Sensory testing is intact to soft touch on all 4 extremities subjectively per patient Coordination: Cerebellar  testing reveals good finger-nose-finger  Gait and station: Patient is able to stand from a seated position. gait is normal.  Reflexes: UTA  DIAGNOSTIC DATA (LABS, IMAGING, TESTING) - I reviewed patient records, labs, notes, testing and imaging myself where available.  Lab Results  Component Value Date   WBC 8.9 04/24/2020   HGB 14.4 04/24/2020   HCT 43.5 04/24/2020   MCV 90.2 04/24/2020   PLT 334 04/24/2020      Component Value Date/Time   NA 140 04/24/2020 1004   K 4.3 04/24/2020 1004   CL 102 04/24/2020 1004   CO2 31 04/24/2020 1004   GLUCOSE 104 04/24/2020 1004   BUN 17 04/24/2020 1004   CREATININE 0.97 04/24/2020 1004   CALCIUM 9.5 04/24/2020 1004   PROT 6.1 04/24/2020 1004   ALBUMIN 3.8 05/21/2019 0437   AST 25 04/24/2020 1004   ALT 22 04/24/2020 1004   ALKPHOS 71 05/21/2019 0437   BILITOT 0.2 04/24/2020 1004   GFRNONAA 72 04/24/2020 1004   GFRAA 83 04/24/2020 1004   Lab Results  Component Value Date   CHOL 189 09/29/2019   HDL 43 (L) 09/29/2019   LDLCALC 122 (H) 09/29/2019   TRIG 126 09/29/2019   CHOLHDL 4.4 09/29/2019   Lab Results  Component Value Date   HGBA1C 5.1 09/29/2019   Lab Results  Component Value Date   OVZCHYIF02 774 04/24/2020   Lab Results  Component Value Date   TSH 2.99 09/29/2019      ASSESSMENT AND PLAN 44 y.o. year old female  has a past medical history of Anxiety, Cervical cancer ([place]) (04/2011), Chronic left shoulder pain (10/07/2017), Chronic tension-type headache, not intractable (10/07/2017), Depression, History of kidney stones, and S/P lumbar fusion (04/20/2018). here with:  1.  Migraine headaches  -Continue Emgality as preventative treatment -Continue Maxalt for abortive treatment -Follow-up in 1 year or sooner if needed   I spent 25 minutes of face-to-face and non-face-to-face time with patient.  This included previsit chart review, lab review, study review, order entry, electronic health record documentation, patient  education.    Ward Givens, MSN, NP-C 05/08/2020, 2:07 PM [facility] [address] [place], [place] [address] [phone]   Made any corrections needed, and agree with history, physical, neuro exam,assessment and plan as stated.     Sarina Ill, MD [facility]

## 2020-05-11 ENCOUNTER — Encounter: Payer: Self-pay | Admitting: Family Medicine

## 2020-05-12 ENCOUNTER — Encounter: Payer: Self-pay | Admitting: Family Medicine

## 2020-05-12 ENCOUNTER — Other Ambulatory Visit: Payer: Self-pay | Admitting: Family Medicine

## 2020-05-12 DIAGNOSIS — F419 Anxiety disorder, unspecified: Secondary | ICD-10-CM

## 2020-05-13 ENCOUNTER — Other Ambulatory Visit: Payer: Self-pay

## 2020-05-13 ENCOUNTER — Other Ambulatory Visit: Payer: Self-pay | Admitting: *Deleted

## 2020-05-13 DIAGNOSIS — F419 Anxiety disorder, unspecified: Secondary | ICD-10-CM

## 2020-05-13 DIAGNOSIS — G43809 Other migraine, not intractable, without status migrainosus: Secondary | ICD-10-CM

## 2020-05-13 MED ORDER — BUSPIRONE HCL 5 MG PO TABS: 5.0000 mg | ORAL_TABLET | Freq: Two times a day (BID) | ORAL | 1 refills | Status: DC

## 2020-05-13 MED ORDER — AMITRIPTYLINE HCL 75 MG PO TABS: 75.0000 mg | ORAL_TABLET | Freq: Every day | ORAL | 1 refills | Status: DC

## 2020-05-13 NOTE — Telephone Encounter (Signed)
Pt med refilled 

## 2020-05-15 ENCOUNTER — Encounter: Payer: Self-pay | Admitting: Family Medicine

## 2020-05-15 ENCOUNTER — Ambulatory Visit: Payer: BC Managed Care – PPO | Admitting: Family Medicine

## 2020-05-15 ENCOUNTER — Other Ambulatory Visit: Payer: Self-pay

## 2020-05-15 VITALS — BP 138/78 | HR 118 | Temp 97.4°F | Ht 63.0 in | Wt 210.0 lb

## 2020-05-15 DIAGNOSIS — R000 Tachycardia, unspecified: Secondary | ICD-10-CM | POA: Diagnosis not present

## 2020-05-15 DIAGNOSIS — E669 Obesity, unspecified: Secondary | ICD-10-CM

## 2020-05-15 MED ORDER — PHENTERMINE HCL 37.5 MG PO TABS: 37.5000 mg | ORAL_TABLET | Freq: Every day | ORAL | 0 refills | Status: DC

## 2020-05-15 NOTE — Progress Notes (Signed)
Subjective:  Patient ID: April Ayala, female    DOB: 08/26/1975  Age: 44 y.o. MRN: [id number]  CC:  Chief Complaint  Patient presents with  . Weight Check      HPI  HPI April Ayala is a 44 year old female patient who presents today for follow-up on obesity. Over the summer she was on Adipex.  She had success and then started gaining weight even on medication.   Unsure of her diet at this time.  She reports that she currently eats what she would like at this time.  However she reports that she has not changed her diet or anything.  We discussed options as possible nutritionist and or referral to a weight management clinic.  At that time.  But now she is out of work and is worried that she is going to continue to gain weight.  We will try another course of Adipex today.  And she is encouraged for diet measures, strict diet and exercise.  She is also advised to stay away from energy drinks that she has been drinking those and her heart rate is up-to-date.  She denies having any active chest pain, headaches, leg swelling, cough, shortness of breath or any other exposure to illness or Covid at this time.  Today patient denies signs and symptoms of COVID 19 infection including fever, chills, cough, shortness of breath, and headache. Past Medical, Surgical, Social History, Allergies, and Medications have been Reviewed.   Past Medical History:  Diagnosis Date  . Anxiety   . Cervical cancer ([place]) 04/2011   Stage 1B squamous cell  . Chronic left shoulder pain 10/07/2017  . Chronic tension-type headache, not intractable 10/07/2017  . Depression   . History of kidney stones    passed  . S/P lumbar fusion 04/20/2018    Current Meds  Medication Sig  . amitriptyline (ELAVIL) 75 MG tablet Take 1 tablet (75 mg total) by mouth at bedtime.  . busPIRone (BUSPAR) 5 MG tablet Take 1 tablet (5 mg total) by mouth 2 (two) times daily.  . cyclobenzaprine (FLEXERIL) 10 MG tablet  cyclobenzaprine 10 mg tablet  Take 1 tablet 3 times a day by oral route as directed.  . Galcanezumab-gnlm (EMGALITY) 120 MG/ML SOAJ Inject 120 mg into the skin every 30 (thirty) days.  Marland Kitchen levocetirizine (XYZAL) 5 MG tablet Take 1 tablet (5 mg total) by mouth every evening.  . naproxen (NAPROSYN) 500 MG tablet Take 1 tablet (500 mg total) by mouth 2 (two) times daily with a meal.  . oxyCODONE-acetaminophen (PERCOCET) 10-325 MG tablet oxycodone-acetaminophen 10 mg-325 mg tablet  Take 1 tablet 4 times a day by oral route as needed.  . pregabalin (LYRICA) 75 MG capsule pregabalin 75 mg capsule  TAKE 1 CAPSULE BY MOUTH TWICE DAILY  . rizatriptan (MAXALT) 10 MG tablet Take 10 mg by mouth 2 (two) times daily as needed.  . rizatriptan (MAXALT-MLT) 10 MG disintegrating tablet Take 1 tablet (10 mg total) by mouth as needed for migraine. May repeat in 2 hours if needed  . rosuvastatin (CRESTOR) 5 MG tablet Take 1 tablet (5 mg total) by mouth daily.  . Vitamin D, Ergocalciferol, (DRISDOL) 1.25 MG (50000 UNIT) CAPS capsule Take 1 capsule (50,000 Units total) by mouth every 7 (seven) days.    ROS:  Review of Systems  Constitutional: Negative.   HENT: Negative.   Eyes: Negative.   Respiratory: Negative.   Cardiovascular: Negative.   Gastrointestinal: Negative.   Genitourinary: Negative.  Musculoskeletal: Negative.   Skin: Negative.   Neurological: Negative.   Endo/Heme/Allergies: Negative.   Psychiatric/Behavioral: Negative.      Objective:   Today's Vitals: BP 138/78 (BP Location: Right Arm, Patient Position: Sitting, Cuff Size: Normal)   Pulse (!) 118   Temp (!) 97.4 F (36.3 C) (Temporal)   Ht 5\' 3"  (1.6 m)   Wt 210 lb (95.3 kg)   LMP 04/11/2012   SpO2 97%   BMI 37.20 kg/m  Vitals with BMI 05/15/2020 04/23/2020 04/11/2020  Height 5\' 3"  5\' 3"  5\' 3"   Weight 210 lbs 210 lbs 210 lbs  BMI 37.21 02.58 52.77  Systolic 824 235 361  Diastolic 78 93 93  Pulse 443 - -     Physical  Exam Vitals and nursing note reviewed.  Constitutional:      Appearance: Normal appearance. She is well-developed and well-groomed. She is obese.  HENT:     Head: Normocephalic and atraumatic.     Right Ear: External ear normal.     Left Ear: External ear normal.     Mouth/Throat:     Comments: Mask in place Eyes:     General:        Right eye: No discharge.        Left eye: No discharge.     Conjunctiva/sclera: Conjunctivae normal.  Cardiovascular:     Rate and Rhythm: Normal rate and regular rhythm.     Pulses: Normal pulses.     Heart sounds: Normal heart sounds.  Pulmonary:     Effort: Pulmonary effort is normal.     Breath sounds: Normal breath sounds.  Musculoskeletal:        General: Normal range of motion.     Cervical back: Normal range of motion and neck supple.  Skin:    General: Skin is warm.  Neurological:     General: No focal deficit present.     Mental Status: She is alert and oriented to person, place, and time.  Psychiatric:        Attention and Perception: Attention normal.        Mood and Affect: Mood normal.        Speech: Speech normal.        Behavior: Behavior normal. Behavior is cooperative.        Thought Content: Thought content normal.        Cognition and Memory: Cognition normal.        Judgment: Judgment normal.     Assessment   1. Obesity (BMI 30-39.9)   2. Tachycardia determined by examination of pulse     Tests ordered No orders of the defined types were placed in this encounter.    Plan: Please see assessment and plan per problem list above.   Meds ordered this encounter  Medications  . phentermine (ADIPEX-P) 37.5 MG tablet    Sig: Take 1 tablet (37.5 mg total) by mouth daily before breakfast.    Dispense:  30 tablet    Refill:  0    Order Specific Question:   Supervising Provider    Answer:   Jacklynn Bue    Patient to follow-up in 06/12/2020  Note: This dictation was prepared with Dragon dictation  along with smaller phrase technology. Similar sounding words can be transcribed inadequately or may not be corrected upon review. Any transcriptional errors that result from this process are unintentional.      Perlie Mayo, NP

## 2020-05-15 NOTE — Patient Instructions (Signed)
HAPPY FALL!  I appreciate the opportunity to provide you with care for your health and wellness. Today we discussed: weight loss   Follow up: 4 weeks for wt, BP and HR check  No labs or referrals today  Please do not drink energy drinks and take Adipex medication at the same time. This is dangerous for your heart. It seems you might not tolerate the energy drinks well given the elevation of your heart rate today. I encourage you to avoid.  Please read the diet changes attached.  Please continue to practice social distancing to keep you, your family, and our community safe.  If you must go out, please wear a mask and practice good handwashing.  It was a pleasure to see you and I look forward to continuing to work together on your health and well-being. Please do not hesitate to call the office if you need care or have questions about your care.  Have a wonderful day and week. With Gratitude, Cherly Beach, DNP, AGNP-BC    Calorie Counting for Weight Loss Calories are units of energy. Your body needs a certain amount of calories from food to keep you going throughout the day. When you eat more calories than your body needs, your body stores the extra calories as fat. When you eat fewer calories than your body needs, your body burns fat to get the energy it needs. Calorie counting means keeping track of how many calories you eat and drink each day. Calorie counting can be helpful if you need to lose weight. If you make sure to eat fewer calories than your body needs, you should lose weight. Ask your health care provider what a healthy weight is for you. For calorie counting to work, you will need to eat the right number of calories in a day in order to lose a healthy amount of weight per week. A dietitian can help you determine how many calories you need in a day and will give you suggestions on how to reach your calorie goal.  A healthy amount of weight to lose per week is usually 1-2  lb (0.5-0.9 kg). This usually means that your daily calorie intake should be reduced by 500-750 calories.  Eating 1,200 - 1,500 calories per day can help most women lose weight.  Eating 1,500 - 1,800 calories per day can help most men lose weight. W What do I need to know about calorie counting? In order to meet your daily calorie goal, you will need to:  Find out how many calories are in each food you would like to eat. Try to do this before you eat.  Decide how much of the food you plan to eat.  Write down what you ate and how many calories it had. Doing this is called keeping a food log. To successfully lose weight, it is important to balance calorie counting with a healthy lifestyle that includes regular activity. Aim for 150 minutes of moderate exercise (such as walking) or 75 minutes of vigorous exercise (such as running) each week. Where do I find calorie information?  The number of calories in a food can be found on a Nutrition Facts label. If a food does not have a Nutrition Facts label, try to look up the calories online or ask your dietitian for help. Remember that calories are listed per serving. If you choose to have more than one serving of a food, you will have to multiply the calories per serving by the amount of  servings you plan to eat. For example, the label on a package of bread might say that a serving size is 1 slice and that there are 90 calories in a serving. If you eat 1 slice, you will have eaten 90 calories. If you eat 2 slices, you will have eaten 180 calories. How do I keep a food log? Immediately after each meal, record the following information in your food log:  What you ate. Don't forget to include toppings, sauces, and other extras on the food.  How much you ate. This can be measured in cups, ounces, or number of items.  How many calories each food and drink had.  The total number of calories in the meal. Keep your food log near you, such as in a small  notebook in your pocket, or use a mobile app or website. Some programs will calculate calories for you and show you how many calories you have left for the day to meet your goal. What are some calorie counting tips?   Use your calories on foods and drinks that will fill you up and not leave you hungry: ? Some examples of foods that fill you up are nuts and nut butters, vegetables, lean proteins, and high-fiber foods like whole grains. High-fiber foods are foods with more than 5 g fiber per serving. ? Drinks such as sodas, specialty coffee drinks, alcohol, and juices have a lot of calories, yet do not fill you up.  Eat nutritious foods and avoid empty calories. Empty calories are calories you get from foods or beverages that do not have many vitamins or protein, such as candy, sweets, and soda. It is better to have a nutritious high-calorie food (such as an avocado) than a food with few nutrients (such as a bag of chips).  Know how many calories are in the foods you eat most often. This will help you calculate calorie counts faster.  Pay attention to calories in drinks. Low-calorie drinks include water and unsweetened drinks.  Pay attention to nutrition labels for "low fat" or "fat free" foods. These foods sometimes have the same amount of calories or more calories than the full fat versions. They also often have added sugar, starch, or salt, to make up for flavor that was removed with the fat.  Find a way of tracking calories that works for you. Get creative. Try different apps or programs if writing down calories does not work for you. What are some portion control tips?  Know how many calories are in a serving. This will help you know how many servings of a certain food you can have.  Use a measuring cup to measure serving sizes. You could also try weighing out portions on a kitchen scale. With time, you will be able to estimate serving sizes for some foods.  Take some time to put servings  of different foods on your favorite plates, bowls, and cups so you know what a serving looks like.  Try not to eat straight from a bag or box. Doing this can lead to overeating. Put the amount you would like to eat in a cup or on a plate to make sure you are eating the right portion.  Use smaller plates, glasses, and bowls to prevent overeating.  Try not to multitask (for example, watch TV or use your computer) while eating. If it is time to eat, sit down at a table and enjoy your food. This will help you to know when you are full.  It will also help you to be aware of what you are eating and how much you are eating. What are tips for following this plan? Reading food labels  Check the calorie count compared to the serving size. The serving size may be smaller than what you are used to eating.  Check the source of the calories. Make sure the food you are eating is high in vitamins and protein and low in saturated and trans fats. Shopping  Read nutrition labels while you shop. This will help you make healthy decisions before you decide to purchase your food.  Make a grocery list and stick to it. Cooking  Try to cook your favorite foods in a healthier way. For example, try baking instead of frying.  Use low-fat dairy products. Meal planning  Use more fruits and vegetables. Half of your plate should be fruits and vegetables.  Include lean proteins like poultry and fish. How do I count calories when eating out?  Ask for smaller portion sizes.  Consider sharing an entree and sides instead of getting your own entree.  If you get your own entree, eat only half. Ask for a box at the beginning of your meal and put the rest of your entree in it so you are not tempted to eat it.  If calories are listed on the menu, choose the lower calorie options.  Choose dishes that include vegetables, fruits, whole grains, low-fat dairy products, and lean protein.  Choose items that are boiled,  broiled, grilled, or steamed. Stay away from items that are buttered, battered, fried, or served with cream sauce. Items labeled "crispy" are usually fried, unless stated otherwise.  Choose water, low-fat milk, unsweetened iced tea, or other drinks without added sugar. If you want an alcoholic beverage, choose a lower calorie option such as a glass of wine or light beer.  Ask for dressings, sauces, and syrups on the side. These are usually high in calories, so you should limit the amount you eat.  If you want a salad, choose a garden salad and ask for grilled meats. Avoid extra toppings like bacon, cheese, or fried items. Ask for the dressing on the side, or ask for olive oil and vinegar or lemon to use as dressing.  Estimate how many servings of a food you are given. For example, a serving of cooked rice is  cup or about the size of half a baseball. Knowing serving sizes will help you be aware of how much food you are eating at restaurants. The list below tells you how big or small some common portion sizes are based on everyday objects: ? 1 oz--4 stacked dice. ? 3 oz--1 deck of cards. ? 1 tsp--1 die. ? 1 Tbsp-- a ping-pong ball. ? 2 Tbsp--1 ping-pong ball. ?  cup-- baseball. ? 1 cup--1 baseball. Summary  Calorie counting means keeping track of how many calories you eat and drink each day. If you eat fewer calories than your body needs, you should lose weight.  A healthy amount of weight to lose per week is usually 1-2 lb (0.5-0.9 kg). This usually means reducing your daily calorie intake by 500-750 calories.  The number of calories in a food can be found on a Nutrition Facts label. If a food does not have a Nutrition Facts label, try to look up the calories online or ask your dietitian for help.  Use your calories on foods and drinks that will fill you up, and not on foods and drinks  that will leave you hungry.  Use smaller plates, glasses, and bowls to prevent overeating. This  information is not intended to replace advice given to you by your health care provider. Make sure you discuss any questions you have with your health care provider. Document Revised: 03/11/2018 Document Reviewed: 05/22/2016 Elsevier Patient Education  [place].   Carbohydrate Counting , Adult  Carbohydrate counting is a method of keeping track of how many carbohydrates you eat. Eating carbohydrates naturally increases the amount of sugar (glucose) in the blood. It is important to know how many carbohydrates you can safely have in each meal. This is different for every person. A diet and nutrition specialist (registered dietitian) can help you make a meal plan and calculate how many carbohydrates you should have at each meal and snack. Carbohydrates are found in the following foods:  Grains, such as breads and cereals.  Dried beans and soy products.  Starchy vegetables, such as potatoes, peas, and corn.  Fruit and fruit juices.  Milk and yogurt.  Sweets and snack foods, such as cake, cookies, candy, chips, and soft drinks. How do I count carbohydrates? There are two ways to count carbohydrates in food. You can use either of the methods or a combination of both. Reading "Nutrition Facts" on packaged food The "Nutrition Facts" list is included on the labels of almost all packaged foods and beverages in the [place] It includes:  The serving size.  Information about nutrients in each serving, including the grams (g) of carbohydrate per serving. To use the "Nutrition Facts":  Decide how many servings you will have.  Multiply the number of servings by the number of carbohydrates per serving.  The resulting number is the total amount of carbohydrates that you will be having. Learning standard serving sizes of other foods When you eat carbohydrate foods that are not packaged or do not include "Nutrition Facts" on the label, you need to measure the servings in order to count the  amount of carbohydrates:  Measure the foods that you will eat with a food scale or measuring cup, if needed.  Decide how many standard-size servings you will eat.  Multiply the number of servings by 15. Most carbohydrate-rich foods have about 15 g of carbohydrates per serving. ? For example, if you eat 8 oz (170 g) of strawberries, you will have eaten 2 servings and 30 g of carbohydrates (2 servings x 15 g = 30 g).  For foods that have more than one food mixed, such as soups and casseroles, you must count the carbohydrates in each food that is included. The following list contains standard serving sizes of common carbohydrate-rich foods. Each of these servings has about 15 g of carbohydrates:   hamburger bun or  English muffin.   oz (15 mL) syrup.   oz (14 g) jelly.  1 slice of bread.  1 six-inch tortilla.  3 oz (85 g) cooked rice or pasta.  4 oz (113 g) cooked dried beans.  4 oz (113 g) starchy vegetable, such as peas, corn, or potatoes.  4 oz (113 g) hot cereal.  4 oz (113 g) mashed potatoes or  of a large baked potato.  4 oz (113 g) canned or frozen fruit.  4 oz (120 mL) fruit juice.  4-6 crackers.  6 chicken nuggets.  6 oz (170 g) unsweetened dry cereal.  6 oz (170 g) plain fat-free yogurt or yogurt sweetened with artificial sweeteners.  8 oz (240 mL) milk.  8  oz (170 g) fresh fruit or one small piece of fruit.  24 oz (680 g) popped popcorn. Example of carbohydrate counting Sample meal  3 oz (85 g) chicken breast.  6 oz (170 g) brown rice.  4 oz (113 g) corn.  8 oz (240 mL) milk.  8 oz (170 g) strawberries with sugar-free whipped topping. Carbohydrate calculation 1. Identify the foods that contain carbohydrates: ? Rice. ? Corn. ? Milk. ? Strawberries. 2. Calculate how many servings you have of each food: ? 2 servings rice. ? 1 serving corn. ? 1 serving milk. ? 1 serving strawberries. 3. Multiply each number of servings by 15 g: ? 2  servings rice x 15 g = 30 g. ? 1 serving corn x 15 g = 15 g. ? 1 serving milk x 15 g = 15 g. ? 1 serving strawberries x 15 g = 15 g. 4. Add together all of the amounts to find the total grams of carbohydrates eaten: ? 30 g + 15 g + 15 g + 15 g = 75 g of carbohydrates total. Summary  Carbohydrate counting is a method of keeping track of how many carbohydrates you eat.  Eating carbohydrates naturally increases the amount of sugar (glucose) in the blood.  Counting how many carbohydrates you eat helps keep your blood glucose within normal limits, which helps you manage your diabetes.  A diet and nutrition specialist (registered dietitian) can help you make a meal plan and calculate how many carbohydrates you should have at each meal and snack. This information is not intended to replace advice given to you by your health care provider. Make sure you discuss any questions you have with your health care provider. Document Revised: 01/14/2017 Document Reviewed: 12/04/2015 Elsevier Patient Education  [place].

## 2020-05-17 NOTE — Assessment & Plan Note (Addendum)
Restarting Adipex at this time.  This will be the last time that we try if is not beneficial for her.  I will encourage her to do referral to nutritionist and/or weight loss clinic.  She definitely needs to have more focus on her diet and lifestyle changes.  Extensive education was provided to her about this today as well as exercise.  As well as written information on AVS.  She reports knowing that she should not rely on the medication to prevent her from gaining weight. Patient acknowledged agreement and understanding of the plan.

## 2020-05-17 NOTE — Assessment & Plan Note (Signed)
Heart rate of 118 in the office.  She reports that she just finished drinking energy drink.  I have encouraged her to refrain from drinking the energy drinks is secondary to also being on Adipex as well as her heart rate does not seem tolerated.  She reports verbalizing understanding of this.

## 2020-05-21 DIAGNOSIS — Z981 Arthrodesis status: Secondary | ICD-10-CM | POA: Diagnosis not present

## 2020-05-21 DIAGNOSIS — M4310 Spondylolisthesis, site unspecified: Secondary | ICD-10-CM | POA: Diagnosis not present

## 2020-05-21 DIAGNOSIS — M545 Low back pain, unspecified: Secondary | ICD-10-CM | POA: Diagnosis not present

## 2020-05-23 ENCOUNTER — Encounter: Payer: Self-pay | Admitting: Family Medicine

## 2020-05-24 ENCOUNTER — Other Ambulatory Visit: Payer: Self-pay | Admitting: *Deleted

## 2020-05-24 DIAGNOSIS — E7841 Elevated Lipoprotein(a): Secondary | ICD-10-CM

## 2020-05-24 MED ORDER — ROSUVASTATIN CALCIUM 5 MG PO TABS: 5.0000 mg | ORAL_TABLET | Freq: Every day | ORAL | 1 refills | Status: DC

## 2020-05-27 ENCOUNTER — Emergency Department (HOSPITAL_COMMUNITY)
Admission: EM | Admit: 2020-05-27 | Discharge: 2020-05-27 | Disposition: A | Discharge status: Home / Self Care | Payer: BC Managed Care – PPO | Attending: Emergency Medicine | Admitting: Emergency Medicine

## 2020-05-27 ENCOUNTER — Emergency Department (HOSPITAL_COMMUNITY): Payer: BC Managed Care – PPO

## 2020-05-27 ENCOUNTER — Other Ambulatory Visit: Payer: Self-pay

## 2020-05-27 ENCOUNTER — Encounter (HOSPITAL_COMMUNITY): Payer: Self-pay | Admitting: *Deleted

## 2020-05-27 DIAGNOSIS — R000 Tachycardia, unspecified: Secondary | ICD-10-CM | POA: Diagnosis not present

## 2020-05-27 DIAGNOSIS — J189 Pneumonia, unspecified organism: Secondary | ICD-10-CM | POA: Diagnosis not present

## 2020-05-27 DIAGNOSIS — R0789 Other chest pain: Secondary | ICD-10-CM | POA: Diagnosis present

## 2020-05-27 DIAGNOSIS — Z8541 Personal history of malignant neoplasm of cervix uteri: Secondary | ICD-10-CM | POA: Insufficient documentation

## 2020-05-27 DIAGNOSIS — R079 Chest pain, unspecified: Secondary | ICD-10-CM | POA: Diagnosis not present

## 2020-05-27 DIAGNOSIS — E876 Hypokalemia: Secondary | ICD-10-CM | POA: Insufficient documentation

## 2020-05-27 DIAGNOSIS — Z20822 Contact with and (suspected) exposure to covid-19: Secondary | ICD-10-CM | POA: Diagnosis not present

## 2020-05-27 DIAGNOSIS — Z87891 Personal history of nicotine dependence: Secondary | ICD-10-CM | POA: Insufficient documentation

## 2020-05-27 DIAGNOSIS — R0602 Shortness of breath: Secondary | ICD-10-CM | POA: Insufficient documentation

## 2020-05-27 HISTORY — DX: Pure hypercholesterolemia, unspecified: E78.00

## 2020-05-27 LAB — COMPREHENSIVE METABOLIC PANEL
ALT: 45 U/L — ABNORMAL HIGH (ref 0–44)
AST: 29 U/L (ref 15–41)
Albumin: 3.5 g/dL (ref 3.5–5.0)
Alkaline Phosphatase: 77 U/L (ref 38–126)
Anion gap: 12 (ref 5–15)
BUN: 6 mg/dL (ref 6–20)
CO2: 25 mmol/L (ref 22–32)
Calcium: 9.5 mg/dL (ref 8.9–10.3)
Chloride: 100 mmol/L (ref 98–111)
Creatinine, Ser: 0.81 mg/dL (ref 0.44–1.00)
GFR, Estimated: >60 mL/min (ref >60)
Glucose, Bld: 88 mg/dL (ref 70–99)
Potassium: 2.9 mmol/L — ABNORMAL LOW (ref 3.5–5.1)
Sodium: 137 mmol/L (ref 135–145)
Total Bilirubin: 0.4 mg/dL (ref 0.3–1.2)
Total Protein: 6.6 g/dL (ref 6.5–8.1)

## 2020-05-27 LAB — CBC WITH DIFFERENTIAL/PLATELET
Abs Immature Granulocytes: 0.05 10*3/uL (ref 0.00–0.07)
Basophils Absolute: 0.1 10*3/uL (ref 0.0–0.1)
Basophils Relative: 1 %
Eosinophils Absolute: 0.3 10*3/uL (ref 0.0–0.5)
Eosinophils Relative: 3 %
HCT: 40.5 % (ref 36.0–46.0)
Hemoglobin: 14.2 g/dL (ref 12.0–15.0)
Immature Granulocytes: 0 %
Lymphocytes Relative: 33 %
Lymphs Abs: 3.8 10*3/uL (ref 0.7–4.0)
MCH: 31.2 pg (ref 26.0–34.0)
MCHC: 35.1 g/dL (ref 30.0–36.0)
MCV: 89 fL (ref 80.0–100.0)
Monocytes Absolute: 0.6 10*3/uL (ref 0.1–1.0)
Monocytes Relative: 6 %
Neutro Abs: 6.6 10*3/uL (ref 1.7–7.7)
Neutrophils Relative %: 57 %
Platelets: 378 10*3/uL (ref 150–400)
RBC: 4.55 MIL/uL (ref 3.87–5.11)
RDW: 14.5 % (ref 11.5–15.5)
WBC: 11.5 10*3/uL — ABNORMAL HIGH (ref 4.0–10.5)
nRBC: 0 % (ref 0.0–0.2)

## 2020-05-27 LAB — I-STAT BETA HCG BLOOD, ED (MC, WL, AP ONLY): I-stat hCG, quantitative: <5 m[IU]/mL (ref <5)

## 2020-05-27 LAB — TROPONIN I (HIGH SENSITIVITY)
Troponin I (High Sensitivity): <2 ng/L (ref <18)
Troponin I (High Sensitivity): 3 ng/L (ref <18)

## 2020-05-27 LAB — RESP PANEL BY RT-PCR (FLU A&B, COVID) ARPGX2
Influenza A by PCR: NEGATIVE
Influenza B by PCR: NEGATIVE
SARS Coronavirus 2 by RT PCR: NEGATIVE

## 2020-05-27 LAB — MAGNESIUM: Magnesium: 1.7 mg/dL (ref 1.7–2.4)

## 2020-05-27 LAB — D-DIMER, QUANTITATIVE: D-Dimer, Quant: 0.65 ug/mL-FEU — ABNORMAL HIGH (ref 0.00–0.50)

## 2020-05-27 MED ORDER — ALBUTEROL SULFATE HFA 108 (90 BASE) MCG/ACT IN AERS
1.0000 | INHALATION_SPRAY | Freq: Four times a day (QID) | RESPIRATORY_TRACT | 0 refills | Status: DC | PRN
Start: 2020-05-27 — End: 2020-07-01

## 2020-05-27 MED ORDER — POTASSIUM CHLORIDE CRYS ER 20 MEQ PO TBCR: 20.0000 meq | EXTENDED_RELEASE_TABLET | Freq: Two times a day (BID) | ORAL | 0 refills | Status: DC

## 2020-05-27 MED ORDER — OXYCODONE-ACETAMINOPHEN 5-325 MG PO TABS
2.0000 | ORAL_TABLET | Freq: Once | ORAL | Status: AC
  Administered 2020-05-27: 2 via ORAL
  Filled 2020-05-27: qty 2

## 2020-05-27 MED ORDER — POTASSIUM CHLORIDE 10 MEQ/100ML IV SOLN
10.0000 meq | Freq: Once | INTRAVENOUS | Status: AC
  Administered 2020-05-27: 10 meq via INTRAVENOUS
  Filled 2020-05-27: qty 100

## 2020-05-27 MED ORDER — AZITHROMYCIN 250 MG PO TABS: 250.0000 mg | ORAL_TABLET | Freq: Every day | ORAL | 0 refills | Status: AC

## 2020-05-27 MED ORDER — MORPHINE SULFATE (PF) 4 MG/ML IV SOLN
4.0000 mg | Freq: Once | INTRAVENOUS | Status: AC
  Administered 2020-05-27: 4 mg via INTRAVENOUS
  Filled 2020-05-27: qty 1

## 2020-05-27 MED ORDER — AMOXICILLIN 500 MG PO CAPS: 1000.0000 mg | ORAL_CAPSULE | Freq: Three times a day (TID) | ORAL | 0 refills | Status: AC

## 2020-05-27 MED ORDER — KETOROLAC TROMETHAMINE 30 MG/ML IJ SOLN
15.0000 mg | Freq: Once | INTRAMUSCULAR | Status: AC
  Administered 2020-05-27: 15 mg via INTRAVENOUS
  Filled 2020-05-27: qty 1

## 2020-05-27 MED ORDER — ACETAMINOPHEN 325 MG PO TABS: 650.0000 mg | ORAL_TABLET | Freq: Once | ORAL | Status: DC

## 2020-05-27 MED ORDER — SODIUM CHLORIDE 0.9 % IV SOLN
1.0000 g | Freq: Once | INTRAVENOUS | Status: AC
  Administered 2020-05-27: 1 g via INTRAVENOUS
  Filled 2020-05-27: qty 10

## 2020-05-27 MED ORDER — AZITHROMYCIN 250 MG PO TABS
500.0000 mg | ORAL_TABLET | Freq: Once | ORAL | Status: AC
  Administered 2020-05-27: 500 mg via ORAL
  Filled 2020-05-27: qty 2

## 2020-05-27 MED ORDER — IOHEXOL 350 MG/ML SOLN
100.0000 mL | Freq: Once | INTRAVENOUS | Status: AC | PRN
  Administered 2020-05-27: 165 mL via INTRAVENOUS

## 2020-05-27 MED ORDER — ONDANSETRON HCL 4 MG/2ML IJ SOLN
4.0000 mg | Freq: Once | INTRAMUSCULAR | Status: AC
  Administered 2020-05-27: 4 mg via INTRAVENOUS
  Filled 2020-05-27: qty 2

## 2020-05-27 MED ORDER — LACTATED RINGERS IV BOLUS
1000.0000 mL | Freq: Once | INTRAVENOUS | Status: AC
  Administered 2020-05-27: 1000 mL via INTRAVENOUS

## 2020-05-27 MED ORDER — POTASSIUM CHLORIDE CRYS ER 20 MEQ PO TBCR
40.0000 meq | EXTENDED_RELEASE_TABLET | Freq: Once | ORAL | Status: AC
  Administered 2020-05-27: 40 meq via ORAL
  Filled 2020-05-27: qty 2

## 2020-05-27 NOTE — ED Provider Notes (Signed)
[facility] EMERGENCY DEPARTMENT Provider Note   CSN: [id number] Arrival date & time: 05/27/20  1337     History Chief Complaint  Patient presents with  . Chest Pain    April Ayala is a 44 y.o. female.  HPI      April Ayala is a 44 y.o. female, with a history of anxiety, hypercholesterolemia, presenting to the ED with chest pain beginning around 12 PM today while at rest. She states she felt some chest pain yesterday that spontaneously resolved. Pain is to the left chest, severe, sharp, waxing and waning, with tingling radiating to the left arm.  Accompanied by shortness of breath.  She has not experienced these symptoms before. She is Covid vaccinated. Denies fever/chills, cough, abdominal pain, back pain, weakness, trauma, N/V/D, syncope, lower extremity pain/swelling, or any other complaints.  Past Medical History:  Diagnosis Date  . Anxiety   . Cervical cancer ([place]) 04/2011   Stage 1B squamous cell  . Chronic left shoulder pain 10/07/2017  . Chronic tension-type headache, not intractable 10/07/2017  . Depression   . High cholesterol   . History of kidney stones    passed  . S/P lumbar fusion 04/20/2018    Patient Active Problem List   Diagnosis Date Noted  . Tachycardia determined by examination of pulse 05/17/2020  . Soreness of tongue 04/23/2020  . Cold sore 04/11/2020  . Pain in joint of right elbow 03/20/2020  . Right tennis elbow 02/21/2020  . Cough with expectoration 02/14/2020  . Vaginal Pap smear following hysterectomy for malignancy 12/18/2019  . Screening for colorectal cancer 12/18/2019  . RLQ abdominal pain 12/18/2019  . History of cervical cancer 10/27/2019  . Environmental and seasonal allergies 09/29/2019  . Encounter for smoking cessation counseling 09/29/2019  . Binge eating 09/29/2019  . Migraine 09/29/2019  . Bilateral carpal tunnel syndrome 09/29/2019  . Encounter for screening mammogram for malignant neoplasm of breast  09/29/2019  . Obesity (BMI 30-39.9) 09/29/2019  . Family history of hyperlipidemia 09/29/2019  . Family history of diabetes mellitus 09/29/2019  . Family history of hypothyroidism 09/29/2019  . Other fatigue 09/29/2019  . Vitamin D deficiency 09/29/2019    Past Surgical History:  Procedure Laterality Date  . ABDOMINAL HYSTERECTOMY    . BREAST BIOPSY Right   . CERVICAL CONIZATION W/BX  03/24/2012   Procedure: CONIZATION CERVIX WITH BIOPSY;  Surgeon: Woodroe Mode, MD;  Location: [facility];  Service: Gynecology;  Laterality: N/A;  . DIAGNOSTIC LAPAROSCOPY  2007   ectopic  . ECTOPIC PREGNANCY SURGERY    . ESSURE TUBAL LIGATION  2010  . INSERTION OF SUPRAPUBIC CATHETER  05/03/2012   Procedure: INSERTION OF SUPRAPUBIC CATHETER;  Surgeon: Alvino Chapel, MD;  Location: [facility];  Service: Gynecology;;  . LEEP    . LUMBAR LAMINECTOMY/DECOMPRESSION MICRODISCECTOMY N/A 04/20/2018   Procedure: LUMBAR FIVE TO SACRAL ONE DECOMPRESSION AND FUSION, POSSIBLE LUMBAR FOUR TO SACRAL ONE; SCREW AND CAGE INSTRUMENTATION;  Surgeon: Melina Schools, MD;  Location: [facility];  Service: Orthopedics;  Laterality: N/A;  5 hrs  . LYMPHADENECTOMY  05/03/2012   Procedure: LYMPHADENECTOMY;  Surgeon: Alvino Chapel, MD;  Location: [facility];  Service: Gynecology;  Laterality: Bilateral;  pelvic  . RADICAL HYSTERECTOMY  05/03/2012   Procedure: RADICAL HYSTERECTOMY;  Surgeon: Alvino Chapel, MD;  Location: [facility];  Service: Gynecology;;  transposition of the ovaries  . SPINE SURGERY    . TUBAL LIGATION       OB  History    Gravida  4   Para  4   Term  4   Preterm      AB      Living  4     SAB      TAB      Ectopic      Multiple      Live Births              Family History  Problem Relation Age of Onset  . Diabetes Mother   . Hypertension Father   . Diabetes Sister   . Migraines Sister   . Hypothyroidism Sister   . Migraines Sister   . Migraines Sister     Social  History   Tobacco Use  . Smoking status: Former Smoker    Packs/day: 0.50    Years: 1.00    Pack years: 0.50    Types: Cigarettes    Start date: 11/03/2018  . Smokeless tobacco: Never Used  . Tobacco comment: smoking cessation information given  Vaping Use  . Vaping Use: Never used  Substance Use Topics  . Alcohol use: Never  . Drug use: Never    Home Medications Prior to Admission medications   Medication Sig Start Date End Date Taking? Authorizing Provider  amitriptyline (ELAVIL) 75 MG tablet Take 1 tablet (75 mg total) by mouth at bedtime. 05/13/20  Yes Perlie Mayo, NP  busPIRone (BUSPAR) 5 MG tablet Take 1 tablet (5 mg total) by mouth 2 (two) times daily. 05/13/20  Yes Fayrene Helper, MD  Cholecalciferol (VITAMIN D3) 1.25 MG (50000 UT) TABS Take 1 tablet by mouth daily.   Yes [provider]  cyclobenzaprine (FLEXERIL) 10 MG tablet Take 10 mg by mouth 3 (three) times daily.    Yes [provider]  Galcanezumab-gnlm (EMGALITY) 120 MG/ML SOAJ Inject 120 mg into the skin every 30 (thirty) days. 02/15/20  Yes Melvenia Beam, MD  naproxen (NAPROSYN) 500 MG tablet Take 1 tablet (500 mg total) by mouth 2 (two) times daily with a meal. 03/06/20  Yes Sanjuana Kava, MD  oxyCODONE (OXYCONTIN) 10 mg 12 hr tablet Take 10 mg by mouth every 12 (twelve) hours.    Yes [provider]  oxyCODONE-acetaminophen (PERCOCET) 10-325 MG tablet Take 1 tablet by mouth 4 (four) times daily as needed.    Yes [provider]  phentermine (ADIPEX-P) 37.5 MG tablet Take 1 tablet (37.5 mg total) by mouth daily before breakfast. 05/15/20  Yes Perlie Mayo, NP  pregabalin (LYRICA) 150 MG capsule Take 150 mg by mouth 2 (two) times daily. 05/20/20  Yes [provider]  rizatriptan (MAXALT) 10 MG tablet Take 10 mg by mouth 2 (two) times daily as needed. 03/31/20  Yes [provider]  rosuvastatin (CRESTOR) 5 MG tablet Take 1 tablet (5 mg total) by mouth  daily. 05/24/20  Yes Perlie Mayo, NP  albuterol (VENTOLIN HFA) 108 (90 Base) MCG/ACT inhaler Inhale 1-2 puffs into the lungs every 6 (six) hours as needed for wheezing or shortness of breath. 05/27/20   Winola Drum C, PA-C  amoxicillin (AMOXIL) 500 MG capsule Take 2 capsules (1,000 mg total) by mouth 3 (three) times daily for 5 days. 05/27/20 06/01/20  Zubin Pontillo C, PA-C  azithromycin (ZITHROMAX) 250 MG tablet Take 1 tablet (250 mg total) by mouth daily for 4 days. 05/28/20 06/01/20  Drelyn Pistilli C, PA-C  levocetirizine (XYZAL) 5 MG tablet Take 1 tablet (5 mg total) by  mouth every evening. Patient not taking: Reported on 05/27/2020 02/27/20   Perlie Mayo, NP  oxyCODONE ER [facility] ER) 9 MG C12A Xtampza ER 9 mg capsule sprinkle  Take 1 capsule every 12 hours by oral route. Patient not taking: Reported on 05/27/2020    [provider]  potassium chloride SA (KLOR-CON) 20 MEQ tablet Take 1 tablet (20 mEq total) by mouth 2 (two) times daily for 5 days. 05/27/20 06/01/20  Giannie Soliday C, PA-C  rizatriptan (MAXALT-MLT) 10 MG disintegrating tablet Take 1 tablet (10 mg total) by mouth as needed for migraine. May repeat in 2 hours if needed 01/04/20   Melvenia Beam, MD  Vitamin D, Ergocalciferol, (DRISDOL) 1.25 MG (50000 UNIT) CAPS capsule Take 1 capsule (50,000 Units total) by mouth every 7 (seven) days. Patient not taking: Reported on 05/27/2020 10/10/19   Perlie Mayo, NP    Allergies    Patient has no known allergies.  Review of Systems   Review of Systems  Constitutional: Negative for chills, diaphoresis and fever.  Respiratory: Positive for shortness of breath. Negative for cough.   Cardiovascular: Positive for chest pain. Negative for leg swelling.  Gastrointestinal: Negative for abdominal pain, diarrhea, nausea and vomiting.  Musculoskeletal: Negative for back pain.  Neurological: Negative for syncope and weakness.  All other systems reviewed and are negative.   Physical  Exam Updated Vital Signs BP 122/82 (BP Location: Left Arm)   Pulse (!) 105   Temp 98.8 F (37.1 C) (Oral)   Resp 19   Ht 5\' 3"  (1.6 m)   Wt 95.3 kg   LMP 04/11/2012   SpO2 100%   BMI 37.20 kg/m   Physical Exam Vitals and nursing note reviewed.  Constitutional:      General: She is in acute distress (pain).     Appearance: She is well-developed. She is not diaphoretic.  HENT:     Head: Normocephalic and atraumatic.     Mouth/Throat:     Mouth: Mucous membranes are moist.     Pharynx: Oropharynx is clear.  Eyes:     Conjunctiva/sclera: Conjunctivae normal.  Cardiovascular:     Rate and Rhythm: Regular rhythm. Tachycardia present.     Pulses: Normal pulses.          Radial pulses are 2+ on the right side and 2+ on the left side.       Posterior tibial pulses are 2+ on the right side and 2+ on the left side.     Heart sounds: Normal heart sounds.     Comments: Tactile temperature in the extremities appropriate and equal bilaterally. Pulmonary:     Effort: Tachypnea present.     Breath sounds: Normal breath sounds.  Chest:     Chest wall: Tenderness present.    Abdominal:     Palpations: Abdomen is soft.     Tenderness: There is no abdominal tenderness. There is no guarding.  Musculoskeletal:     Cervical back: Neck supple.     Right lower leg: No edema.     Left lower leg: No edema.  Lymphadenopathy:     Cervical: No cervical adenopathy.  Skin:    General: Skin is warm and dry.  Neurological:     Mental Status: She is alert.  Psychiatric:        Mood and Affect: Mood is anxious. Affect is tearful.        Speech: Speech normal.     ED Results / Procedures /  Treatments   Labs (all labs ordered are listed, but only abnormal results are displayed) Labs Reviewed  COMPREHENSIVE METABOLIC PANEL - Abnormal; Notable for the following components:      Result Value   Potassium 2.9 (*)    ALT 45 (*)    All other components within normal limits  CBC WITH  DIFFERENTIAL/PLATELET - Abnormal; Notable for the following components:   WBC 11.5 (*)    All other components within normal limits  D-DIMER, QUANTITATIVE (NOT AT [facility]) - Abnormal; Notable for the following components:   D-Dimer, Quant 0.65 (*)    All other components within normal limits  RESP PANEL BY RT-PCR (FLU A&B, COVID) ARPGX2  MAGNESIUM  I-STAT BETA HCG BLOOD, ED ([facility], [facility], [facility] ONLY)  TROPONIN I (HIGH SENSITIVITY)  TROPONIN I (HIGH SENSITIVITY)    EKG EKG Interpretation  Date/Time:  Monday May 27 2020 13:48:18 EST Ventricular Rate:  107 PR Interval:    QRS Duration: 99 QT Interval:  347 QTC Calculation: 463 R Axis:   64 Text Interpretation: Sinus tachycardia Low voltage, precordial leads Confirmed by Fredia Sorrow [phone]) on 05/27/2020 5:24:32 PM   Radiology DG Chest 2 View  Result Date: 05/27/2020 CLINICAL DATA:  Chest pain. EXAM: CHEST - 2 VIEW COMPARISON:  03/16/2018. FINDINGS: The heart size and mediastinal contours are within normal limits. Low lung volumes. Both lungs are clear. No visible pleural effusions or pneumothorax. The visualized skeletal structures are unremarkable. IMPRESSION: No active cardiopulmonary disease. Electronically Signed   By: Margaretha Sheffield MD   On: 05/27/2020 14:51   CT Angio Chest PE W and/or Wo Contrast  Result Date: 05/27/2020 CLINICAL DATA:  left sided chest pain that started today and radiates down left arm along with SOB. Pt was sitting, doing nothing at the time the pain started EXAM: CT ANGIOGRAPHY CHEST WITH CONTRAST TECHNIQUE: Multidetector CT imaging of the chest was performed using the standard protocol during bolus administration of intravenous contrast. Multiplanar CT image reconstructions and MIPs were obtained to evaluate the vascular anatomy. CONTRAST:  142mL OMNIPAQUE IOHEXOL 350 MG/ML SOLN COMPARISON:  Chest x-ray 05/27/2020 FINDINGS: Cardiovascular: Fairly satisfactory opacification of the pulmonary arteries to the  segmental level. No evidence of pulmonary embolism. Normal heart size. No pericardial effusion. No definite coronary artery calcifications. Mediastinum/Nodes: No enlarged mediastinal, hilar, or axillary lymph nodes. Thyroid gland, trachea, and esophagus demonstrate no significant findings. Lungs/Pleura: Diffuse, but upper lobe predominant, nodular-like patchy peribronchovascular opacities. No pulmonary mass. Linear atelectasis within the right lower lobe. No pleural effusion. No pneumothorax. Upper Abdomen: High density within the left collecting system likely represents excretion of intravenous contrast. Otherwise no acute abnormality. Musculoskeletal: No chest wall abnormality. No acute or significant osseous findings. Review of the MIP images confirms the above findings. IMPRESSION: Multifocal, upper lobe predominant, peribronchovascular airspace opacities likely represent infection/inflammation. Recommend short-term follow-up in 2-3 months with CT chest to evaluate for resolution and exclude any underlying pathology. Electronically Signed   By: Iven Finn M.D.   On: 05/27/2020 17:00    Procedures Procedures (including critical care time)  Medications Ordered in ED Medications  ondansetron (ZOFRAN) injection 4 mg (4 mg Intravenous Given 05/27/20 1425)  morphine 4 MG/ML injection 4 mg (4 mg Intravenous Given 05/27/20 1427)  lactated ringers bolus 1,000 mL (0 mLs Intravenous Stopped 05/27/20 1733)  potassium chloride 10 mEq in 100 mL IVPB (0 mEq Intravenous Stopped 05/27/20 1733)  potassium chloride SA (KLOR-CON) CR tablet 40 mEq (40 mEq Oral Given 05/27/20 1550)  iohexol (OMNIPAQUE) 350 MG/ML injection 100 mL (165 mLs Intravenous Contrast Given 05/27/20 1622)  ketorolac (TORADOL) 30 MG/ML injection 15 mg (15 mg Intravenous Given 05/27/20 1746)  lactated ringers bolus 1,000 mL (0 mLs Intravenous Stopped 05/27/20 1840)  cefTRIAXone (ROCEPHIN) 1 g in sodium chloride 0.9 % 100 mL IVPB (0 g  Intravenous Stopped 05/27/20 1822)  azithromycin (ZITHROMAX) tablet 500 mg (500 mg Oral Given 05/27/20 1749)  oxyCODONE-acetaminophen (PERCOCET/ROXICET) 5-325 MG per tablet 2 tablet (2 tablets Oral Given 05/27/20 1749)    ED Course  I have reviewed the triage vital signs and the nursing notes.  Pertinent labs & imaging results that were available during my care of the patient were reviewed by me and considered in my medical decision making (see chart for details).  Clinical Course as of May 28 1937  [facility] May 27, 2020  1532 Patient endorses improved pain, now rated 5/10.  She is much more calm.   [SJ]  1937 Patient's cuff needed to be reapplied. When resampled, BP consistent with previous values. Not hypotensive.  BP: 90/73 [SJ]  1940 This was a completely erroneous reading as the patient was not tachycardic on manual assessment.  Pulse Rate(!): 109 [SJ]    Clinical Course User Index [SJ] Algernon Mundie, Helane Gunther, PA-C   MDM Rules/Calculators/A&P                          Patient presents with chest pain and shortness of breath. Patient is nontoxic appearing, afebrile, not tachycardic, not tachypneic, not hypotensive, maintains excellent SPO2 on room air.   I have reviewed the patient's chart to obtain more information.   I reviewed and interpreted the patient's labs and radiological studies. Very mild leukocytosis.  Low suspicion for sepsis. Hypokalemia noted and addressed; discussed with patient. D-dimer mildly elevated.  CT PE study without evidence of PE, however, suspicion for multifocal pneumonia.   We discussed the radiologist recommendation for repeat CT in 2 to 3 months.  In addition, patient was provided with a printout of this CT read.  The patient was given instructions for home care as well as return precautions. Patient voices understanding of these instructions, accepts the plan, and is comfortable with discharge.     Final Clinical Impression(s) / ED Diagnoses Final  diagnoses:  Multifocal pneumonia  Hypokalemia    Rx / DC Orders ED Discharge Orders         Ordered    azithromycin (ZITHROMAX) 250 MG tablet  Daily        05/27/20 1814    potassium chloride SA (KLOR-CON) 20 MEQ tablet  2 times daily        05/27/20 1814    amoxicillin (AMOXIL) 500 MG capsule  3 times daily        05/27/20 1814    albuterol (VENTOLIN HFA) 108 (90 Base) MCG/ACT inhaler  Every 6 hours PRN        05/27/20 1925           Layla Maw 05/28/20 1940    Pattricia Boss, MD 05/30/20 1402

## 2020-05-27 NOTE — ED Triage Notes (Signed)
Pt c/o left sided chest pain that started today and radiates down left arm along with SOB. Pt was sitting, doing nothing at the time the pain started. Pt reports it is very difficult for her to breathe and is holding her chest. Pt tearful in triage. Pt reports she had pain like this yesterday but it went away after she fell asleep.

## 2020-05-27 NOTE — Discharge Instructions (Signed)
  Please take all of your antibiotics until finished!   You may develop abdominal discomfort or diarrhea from the antibiotic.  You may help offset this with probiotics which you can buy or get in yogurt. Do not eat or take the probiotics until 2 hours after your antibiotic.   Albuterol: May use the albuterol, as prescribed, for wheezing or shortness of breath.  Congestion: Plain guaifenesin (generic for plain Mucinex) may help relieve congestion. Saline sinus rinses and saline nasal sprays may also help relieve congestion. If you do not have high blood pressure, heart problems, or an allergy to such medications, you may also try phenylephrine or Sudafed.  Low potassium (hypokalemia): Your potassium was lower than normal. Please refer to the attached pages regarding changes in diet to increase your potassium. You have also been prescribed a course of potassium supplementation. Finish this course of medication and recommend having your potassium rechecked through a primary care provider.  Repeat CT scan: Radiologist recommends repeat CT scan of the chest in 2 to 3 months, which is between January and February 2022.  They would be checking to assure the opacities that were noted on the scan today have resolved and that there is no other underlying pathology.  This repeat imaging study can be arranged through primary care provider.  Return: Return to the emergency department for shortness of breath, uncontrolled pain, persistent vomiting, or any other major concerns.

## 2020-06-05 ENCOUNTER — Encounter: Payer: Self-pay | Admitting: Family Medicine

## 2020-06-05 ENCOUNTER — Other Ambulatory Visit: Payer: Self-pay

## 2020-06-05 ENCOUNTER — Encounter: Payer: Self-pay | Admitting: Nurse Practitioner

## 2020-06-05 ENCOUNTER — Telehealth (INDEPENDENT_AMBULATORY_CARE_PROVIDER_SITE_OTHER): Payer: BC Managed Care – PPO | Admitting: Nurse Practitioner

## 2020-06-05 DIAGNOSIS — R519 Headache, unspecified: Secondary | ICD-10-CM | POA: Diagnosis not present

## 2020-06-05 MED ORDER — AMOXICILLIN-POT CLAVULANATE 875-125 MG PO TABS: 1.0000 | ORAL_TABLET | Freq: Two times a day (BID) | ORAL | 0 refills | Status: AC

## 2020-06-05 MED ORDER — AMOXICILLIN-POT CLAVULANATE 875-125 MG PO TABS: 1.0000 | ORAL_TABLET | Freq: Two times a day (BID) | ORAL | 0 refills | Status: DC

## 2020-06-05 NOTE — Telephone Encounter (Signed)
Pt has an appt with michael gray today 06-05-20 at 8am

## 2020-06-05 NOTE — Progress Notes (Addendum)
Acute Office Visit  Subjective:    Patient ID: April Ayala, female    DOB: [birth year], 44 y.o.   MRN: [id number]  Chief Complaint  Patient presents with  . Otalgia    R side   . Sore Throat    R side, lymphnode swelling   . Fever    101.0     HPI Patient is in today for right throat/ear pain.  This has been ongoing for 2 days.  She was recently treated for PNA with amoxicillin and z-pack around Thanksgiving.  Denies fatigue, nausea, sinus pressure, rhinorrhea, cough, wheezing, or SOB.  Past Medical History:  Diagnosis Date  . Anxiety   . Cervical cancer ([place]) 04/2011   Stage 1B squamous cell  . Chronic left shoulder pain 10/07/2017  . Chronic tension-type headache, not intractable 10/07/2017  . Depression   . High cholesterol   . History of kidney stones    passed  . S/P lumbar fusion 04/20/2018    Past Surgical History:  Procedure Laterality Date  . ABDOMINAL HYSTERECTOMY    . BREAST BIOPSY Right   . CERVICAL CONIZATION W/BX  03/24/2012   Procedure: CONIZATION CERVIX WITH BIOPSY;  Surgeon: Woodroe Mode, MD;  Location: [facility];  Service: Gynecology;  Laterality: N/A;  . DIAGNOSTIC LAPAROSCOPY  2007   ectopic  . ECTOPIC PREGNANCY SURGERY    . ESSURE TUBAL LIGATION  2010  . INSERTION OF SUPRAPUBIC CATHETER  05/03/2012   Procedure: INSERTION OF SUPRAPUBIC CATHETER;  Surgeon: Alvino Chapel, MD;  Location: [facility];  Service: Gynecology;;  . LEEP    . LUMBAR LAMINECTOMY/DECOMPRESSION MICRODISCECTOMY N/A 04/20/2018   Procedure: LUMBAR FIVE TO SACRAL ONE DECOMPRESSION AND FUSION, POSSIBLE LUMBAR FOUR TO SACRAL ONE; SCREW AND CAGE INSTRUMENTATION;  Surgeon: Melina Schools, MD;  Location: [facility];  Service: Orthopedics;  Laterality: N/A;  5 hrs  . LYMPHADENECTOMY  05/03/2012   Procedure: LYMPHADENECTOMY;  Surgeon: Alvino Chapel, MD;  Location: [facility];  Service: Gynecology;  Laterality: Bilateral;  pelvic  . RADICAL HYSTERECTOMY  05/03/2012    Procedure: RADICAL HYSTERECTOMY;  Surgeon: Alvino Chapel, MD;  Location: [facility];  Service: Gynecology;;  transposition of the ovaries  . SPINE SURGERY    . TUBAL LIGATION      Family History  Problem Relation Age of Onset  . Diabetes Mother   . Hypertension Father   . Diabetes Sister   . Migraines Sister   . Hypothyroidism Sister   . Migraines Sister   . Migraines Sister     Social History   Socioeconomic History  . Marital status: Significant Other    Spouse name: Not on file  . Number of children: 4  . Years of education: Not on file  . Highest education level: 12th grade  Occupational History  . Occupation: [employer]  Tobacco Use  . Smoking status: Former Smoker    Packs/day: 0.50    Years: 1.00    Pack years: 0.50    Types: Cigarettes    Start date: 11/03/2018  . Smokeless tobacco: Never Used  . Tobacco comment: smoking cessation information given  Vaping Use  . Vaping Use: Never used  Substance and Sexual Activity  . Alcohol use: Never  . Drug use: Never  . Sexual activity: Not Currently    Birth control/protection: Surgical    Comment: hyst  Other Topics Concern  . Not on file  Social History Narrative   Lives with fiance and 1  daughter      1 son is with grandmother    Oldest daughter is 65 lives with grandfather      Works as a Aeronautical engineer at [employer].     Lives in [place], [place].      6 dogs, a  beard dragon, a snake, and a hamster and a cat      Enjoys: taking care of her pets      Diet: avoids lunch due to work, eats all food groups   Caffeine: monsters in the [employer]: limited      Wears seat belt    Does not use phone while driving    [place] at home       Right handed   Social Determinants of Health   Financial Resource Strain: [place]   . Difficulty of Paying Living Expenses: Not very hard  Food Insecurity: No Food Insecurity  . Worried About Charity fundraiser in the Last Year: Never true  . Ran  Out of Food in the Last Year: Never true  Transportation Needs: No Transportation Needs  . Lack of Transportation (Medical): No  . Lack of Transportation (Non-Medical): No  Physical Activity: Insufficiently Active  . Days of Exercise per Week: 3 days  . Minutes of Exercise per Session: 30 min  Stress: No Stress Concern Present  . Feeling of Stress : Not at all  Social Connections: Moderately Isolated  . Frequency of Communication with Friends and Family: Never  . Frequency of Social Gatherings with Friends and Family: Never  . Attends Religious Services: 1 to 4 times per year  . Active Member of Clubs or Organizations: No  . Attends Archivist Meetings: Never  . Marital Status: Living with partner  Intimate Partner Violence: Not At Risk  . Fear of Current or Ex-Partner: No  . Emotionally Abused: No  . Physically Abused: No  . Sexually Abused: No    Outpatient Medications Prior to Visit  Medication Sig Dispense Refill  . albuterol (VENTOLIN HFA) 108 (90 Base) MCG/ACT inhaler Inhale 1-2 puffs into the lungs every 6 (six) hours as needed for wheezing or shortness of breath. 18 g 0  . amitriptyline (ELAVIL) 75 MG tablet Take 1 tablet (75 mg total) by mouth at bedtime. 30 tablet 1  . busPIRone (BUSPAR) 5 MG tablet Take 1 tablet (5 mg total) by mouth 2 (two) times daily. 60 tablet 1  . Cholecalciferol (VITAMIN D3) 1.25 MG (50000 UT) TABS Take 1 tablet by mouth daily.    . cyclobenzaprine (FLEXERIL) 10 MG tablet Take 10 mg by mouth 3 (three) times daily.     . Galcanezumab-gnlm (EMGALITY) 120 MG/ML SOAJ Inject 120 mg into the skin every 30 (thirty) days. 1 mL 10  . levocetirizine (XYZAL) 5 MG tablet Take 1 tablet (5 mg total) by mouth every evening. 30 tablet 1  . naproxen (NAPROSYN) 500 MG tablet Take 1 tablet (500 mg total) by mouth 2 (two) times daily with a meal. 60 tablet 5  . oxyCODONE (OXYCONTIN) 10 mg 12 hr tablet Take 10 mg by mouth every 12 (twelve) hours.     Marland Kitchen  oxyCODONE ER (XTAMPZA ER) 9 MG C12A Xtampza ER 9 mg capsule sprinkle  Take 1 capsule every 12 hours by oral route.    Marland Kitchen oxyCODONE-acetaminophen (PERCOCET) 10-325 MG tablet Take 1 tablet by mouth 4 (four) times daily as needed.     . phentermine (ADIPEX-P) 37.5 MG  tablet Take 1 tablet (37.5 mg total) by mouth daily before breakfast. 30 tablet 0  . pregabalin (LYRICA) 150 MG capsule Take 150 mg by mouth 2 (two) times daily.    . rizatriptan (MAXALT) 10 MG tablet Take 10 mg by mouth 2 (two) times daily as needed.    . rizatriptan (MAXALT-MLT) 10 MG disintegrating tablet Take 1 tablet (10 mg total) by mouth as needed for migraine. May repeat in 2 hours if needed 9 tablet 11  . rosuvastatin (CRESTOR) 5 MG tablet Take 1 tablet (5 mg total) by mouth daily. 30 tablet 1  . Vitamin D, Ergocalciferol, (DRISDOL) 1.25 MG (50000 UNIT) CAPS capsule Take 1 capsule (50,000 Units total) by mouth every 7 (seven) days. 4 capsule 3  . potassium chloride SA (KLOR-CON) 20 MEQ tablet Take 1 tablet (20 mEq total) by mouth 2 (two) times daily for 5 days. 10 tablet 0   No facility-administered medications prior to visit.    No Known Allergies  Review of Systems  Constitutional: Positive for fever. Negative for fatigue.  HENT: Positive for ear pain, facial swelling and sore throat.        States she has a swollen gland or lymph node to left side of her face and that is painful       Objective:    Physical Exam  LMP 04/11/2012  Wt Readings from Last 3 Encounters:  05/27/20 210 lb (95.3 kg)  05/15/20 210 lb (95.3 kg)  04/23/20 210 lb (95.3 kg)    There are no preventive care reminders to display for this patient.  There are no preventive care reminders to display for this patient.   Lab Results  Component Value Date   TSH 2.99 09/29/2019   Lab Results  Component Value Date   WBC 11.5 (H) 05/27/2020   HGB 14.2 05/27/2020   HCT 40.5 05/27/2020   MCV 89.0 05/27/2020   PLT 378 05/27/2020   Lab  Results  Component Value Date   NA 137 05/27/2020   K 2.9 (L) 05/27/2020   CO2 25 05/27/2020   GLUCOSE 88 05/27/2020   BUN 6 05/27/2020   CREATININE 0.81 05/27/2020   BILITOT 0.4 05/27/2020   ALKPHOS 77 05/27/2020   AST 29 05/27/2020   ALT 45 (H) 05/27/2020   PROT 6.6 05/27/2020   ALBUMIN 3.5 05/27/2020   CALCIUM 9.5 05/27/2020   ANIONGAP 12 05/27/2020   Lab Results  Component Value Date   CHOL 189 09/29/2019   Lab Results  Component Value Date   HDL 43 (L) 09/29/2019   Lab Results  Component Value Date   LDLCALC 122 (H) 09/29/2019   Lab Results  Component Value Date   TRIG 126 09/29/2019   Lab Results  Component Value Date   CHOLHDL 4.4 09/29/2019   Lab Results  Component Value Date   HGBA1C 5.1 09/29/2019       Assessment & Plan:   Problem List Items Addressed This Visit      Other   Facial pain, acute - Primary    -right facial pain related to a swollen gland or lymph node -she has been febrile and is recovering from PNA -Rx. augmentin           Meds ordered this encounter  Medications  . DISCONTD: amoxicillin-clavulanate (AUGMENTIN) 875-125 MG tablet    Sig: Take 1 tablet by mouth 2 (two) times daily.    Dispense:  20 tablet    Refill:  0  .  amoxicillin-clavulanate (AUGMENTIN) 875-125 MG tablet    Sig: Take 1 tablet by mouth 2 (two) times daily for 7 days.    Dispense:  14 tablet    Refill:  0   Date:  06/05/2020   Location of Patient: Home Location of Provider: Office Consent was obtain for visit to be over via telehealth. I verified that I am speaking with the correct person using two identifiers.  I connected with  April Ayala on 06/05/20 via telephone and verified that I am speaking with the correct person using two identifiers.   I discussed the limitations of evaluation and management by telemedicine. The patient expressed understanding and agreed to proceed.  Time spent: 8 minutes   Noreene Larsson, NP

## 2020-06-05 NOTE — Assessment & Plan Note (Addendum)
-  right facial pain related to a swollen gland or lymph node -she has been febrile since her pain started -Rx. augmentin

## 2020-06-12 ENCOUNTER — Encounter: Payer: Self-pay | Admitting: Family Medicine

## 2020-06-12 ENCOUNTER — Encounter: Payer: BC Managed Care – PPO | Admitting: Family Medicine

## 2020-06-13 ENCOUNTER — Telehealth: Payer: Self-pay | Admitting: *Deleted

## 2020-06-13 NOTE — Telephone Encounter (Signed)
Started renewal PA for [employer]. Key: [id number]. Received a message from Cover My Meds which stated the following: The member recently filled this medication and will be able to return for their next refill according to their plan limits.

## 2020-06-13 NOTE — Progress Notes (Signed)
  This encounter was created in error - please disregard.  Patient had to reschedule

## 2020-06-19 ENCOUNTER — Ambulatory Visit: Payer: BC Managed Care – PPO | Admitting: Family Medicine

## 2020-06-26 ENCOUNTER — Ambulatory Visit (INDEPENDENT_AMBULATORY_CARE_PROVIDER_SITE_OTHER): Payer: BC Managed Care – PPO | Admitting: Family Medicine

## 2020-06-26 ENCOUNTER — Encounter: Payer: Self-pay | Admitting: Family Medicine

## 2020-06-26 ENCOUNTER — Other Ambulatory Visit: Payer: Self-pay

## 2020-06-26 VITALS — BP 128/74 | HR 104 | Temp 97.4°F | Ht 63.0 in | Wt 218.0 lb

## 2020-06-26 DIAGNOSIS — R030 Elevated blood-pressure reading, without diagnosis of hypertension: Secondary | ICD-10-CM

## 2020-06-26 DIAGNOSIS — E669 Obesity, unspecified: Secondary | ICD-10-CM

## 2020-06-26 DIAGNOSIS — R000 Tachycardia, unspecified: Secondary | ICD-10-CM

## 2020-06-26 HISTORY — DX: Elevated blood-pressure reading, without diagnosis of hypertension: R03.0

## 2020-06-26 MED ORDER — PHENTERMINE HCL 37.5 MG PO TABS: 37.5000 mg | ORAL_TABLET | Freq: Every day | ORAL | 0 refills | Status: DC

## 2020-06-26 NOTE — Assessment & Plan Note (Signed)
Heart rate slightly improved at 104.  I have advised for her to reduce her caffeine intake.  She drinks water and exercise for cardiovascular benefits she reports understanding of this.

## 2020-06-26 NOTE — Patient Instructions (Signed)
  I appreciate the opportunity to provide you with care for your health and wellness. Today we discussed: wt   Follow up: 4 weeks in office- wt and bp and hr check  No labs or referrals today  Avoid sugary sodas as they are empty calories and will not help you lose weight. Switch to the diet version if you enjoy drinking soda.  Increase water to 8-10 cups   Exercise 30 mins daily- walking is a great way to lose weight  Please continue to practice social distancing to keep you, your family, and our community safe.  If you must go out, please wear a mask and practice good handwashing.  It was a pleasure to see you and I look forward to continuing to work together on your health and well-being. Please do not hesitate to call the office if you need care or have questions about your care.  Have a wonderful day. With Gratitude, Cherly Beach, DNP, AGNP-BC

## 2020-06-26 NOTE — Progress Notes (Signed)
Subjective:  Patient ID: April Ayala, female    DOB: Aug 30, 1975  Age: 44 y.o. MRN: [id number]  CC:  Chief Complaint  Patient presents with  . Weight Check      HPI  HPI April Ayala is a 44 year old female patient who presents today for follow-up on obesity. Over the summer she was on Adipex.  She had success and then started gaining weight even on medication.  Unsure of her diet at this time.  Reports that she drinks nondiet sodas regularly. She reports that she currently eats what she would like at this time. She has not had any diet or lifestyle changes since her last appointment.. We discussed options as possible nutritionist and or referral to a weight management clinic.     She has been off of the phentermine now for about 3 weeks secondary to not be know to make her last appointment. We will try another course of Adipex today.  And she is encouraged for diet measures, strict diet and exercise.  She is also advised to stay away from energy drinks that she has been drinking those and her heart rate is up-to-date.  She denies having any active chest pain, headaches, leg swelling, cough, shortness of breath or any other exposure to illness or Covid at this time.  Today patient denies signs and symptoms of COVID 19 infection including fever, chills, cough, shortness of breath, and headache. Past Medical, Surgical, Social History, Allergies, and Medications have been Reviewed.   Past Medical History:  Diagnosis Date  . Anxiety   . Cervical cancer ([place]) 04/2011   Stage 1B squamous cell  . Chronic left shoulder pain 10/07/2017  . Chronic tension-type headache, not intractable 10/07/2017  . Depression   . High cholesterol   . History of kidney stones    passed  . S/P lumbar fusion 04/20/2018    Current Meds  Medication Sig  . albuterol (VENTOLIN HFA) 108 (90 Base) MCG/ACT inhaler Inhale 1-2 puffs into the lungs every 6 (six) hours as needed for wheezing or shortness  of breath.  Marland Kitchen amitriptyline (ELAVIL) 75 MG tablet Take 1 tablet (75 mg total) by mouth at bedtime.  . busPIRone (BUSPAR) 5 MG tablet Take 1 tablet (5 mg total) by mouth 2 (two) times daily.  . Cholecalciferol (VITAMIN D3) 1.25 MG (50000 UT) TABS Take 1 tablet by mouth daily.  . cyclobenzaprine (FLEXERIL) 10 MG tablet Take 10 mg by mouth 3 (three) times daily.   . Galcanezumab-gnlm (EMGALITY) 120 MG/ML SOAJ Inject 120 mg into the skin every 30 (thirty) days.  . naproxen (NAPROSYN) 500 MG tablet Take 1 tablet (500 mg total) by mouth 2 (two) times daily with a meal.  . oxyCODONE (OXYCONTIN) 10 mg 12 hr tablet Take 10 mg by mouth every 12 (twelve) hours.   Marland Kitchen oxyCODONE ER (XTAMPZA ER) 9 MG C12A Xtampza ER 9 mg capsule sprinkle  Take 1 capsule every 12 hours by oral route.  Marland Kitchen oxyCODONE-acetaminophen (PERCOCET) 10-325 MG tablet Take 1 tablet by mouth 4 (four) times daily as needed.   . pregabalin (LYRICA) 150 MG capsule Take 150 mg by mouth 2 (two) times daily.  . rizatriptan (MAXALT) 10 MG tablet Take 10 mg by mouth 2 (two) times daily as needed.  . rizatriptan (MAXALT-MLT) 10 MG disintegrating tablet Take 1 tablet (10 mg total) by mouth as needed for migraine. May repeat in 2 hours if needed  . rosuvastatin (CRESTOR) 5 MG tablet Take 1  tablet (5 mg total) by mouth daily.  . Vitamin D, Ergocalciferol, (DRISDOL) 1.25 MG (50000 UNIT) CAPS capsule Take 1 capsule (50,000 Units total) by mouth every 7 (seven) days.  . [DISCONTINUED] phentermine (ADIPEX-P) 37.5 MG tablet Take 1 tablet (37.5 mg total) by mouth daily before breakfast.    ROS:  Review of Systems  HENT: Negative.   Eyes: Negative.   Respiratory: Negative.   Cardiovascular: Negative.   Gastrointestinal: Negative.   Genitourinary: Negative.   Musculoskeletal: Negative.   Skin: Negative.   Neurological: Negative.   Endo/Heme/Allergies: Negative.   Psychiatric/Behavioral: Negative.      Objective:   Today's Vitals: BP 128/74 (BP  Location: Right Arm, Patient Position: Sitting, Cuff Size: Normal)   Pulse (!) 104   Temp (!) 97.4 F (36.3 C) (Tympanic)   Ht 5\' 3"  (1.6 m)   Wt 218 lb (98.9 kg)   LMP 04/11/2012   SpO2 97%   BMI 38.62 kg/m  Vitals with BMI 06/26/2020 06/12/2020 05/27/2020  Height 5\' 3"  5\' 3"  -  Weight 218 lbs 207 lbs -  BMI [id number] -  Systolic 0000000 - 90  Diastolic 74 - 73  Pulse [id number] - 109     Physical Exam Vitals and nursing note reviewed.  Constitutional:      Appearance: Normal appearance. She is well-developed and well-groomed. She is obese.  HENT:     Head: Normocephalic and atraumatic.     Right Ear: External ear normal.     Left Ear: External ear normal.     Mouth/Throat:     Comments: Mask in place  Eyes:     General:        Right eye: No discharge.        Left eye: No discharge.     Conjunctiva/sclera: Conjunctivae normal.  Cardiovascular:     Rate and Rhythm: Normal rate and regular rhythm.     Pulses: Normal pulses.     Heart sounds: Normal heart sounds.  Pulmonary:     Effort: Pulmonary effort is normal.     Breath sounds: Normal breath sounds.  Musculoskeletal:        General: Normal range of motion.     Cervical back: Normal range of motion and neck supple.  Skin:    General: Skin is warm.  Neurological:     General: No focal deficit present.     Mental Status: She is alert and oriented to person, place, and time.  Psychiatric:        Attention and Perception: Attention normal.        Mood and Affect: Mood normal.        Speech: Speech normal.        Behavior: Behavior normal. Behavior is cooperative.        Thought Content: Thought content normal.        Cognition and Memory: Cognition normal.        Judgment: Judgment normal.      Assessment   1. Prehypertension   2. Obesity (BMI 30-39.9)   3. Tachycardia determined by examination of pulse     Tests ordered No orders of the defined types were placed in this encounter.    Plan: Please see  assessment and plan per problem list above.   Meds ordered this encounter  Medications  . phentermine (ADIPEX-P) 37.5 MG tablet    Sig: Take 1 tablet (37.5 mg total) by mouth daily before breakfast.    Dispense:  30  tablet    Refill:  0    Order Specific Question:   Supervising Provider    Answer:   Jacklynn Bue    Patient to follow-up in 07/24/2020   Perlie Mayo, NP

## 2020-06-26 NOTE — Assessment & Plan Note (Signed)
Prehypertensive.  Advised for her to make sure that she reduces her caffeine intake increases her water and exercises for cardiovascular health.

## 2020-06-26 NOTE — Assessment & Plan Note (Signed)
Missed last wt and BP check appt due to transportation. Reports that she drinks non diet soda several cups daily. Has no started exercising.   I again did extensive education about exercise and the change of diet and lifestyle.  I educated her on the fact that the medication will not take the weight off of your still eating and healthy.  She reports acknowledgment of this.  We will do 1 more months of the medication with follow-up.

## 2020-06-29 ENCOUNTER — Other Ambulatory Visit: Payer: Self-pay

## 2020-06-29 ENCOUNTER — Encounter (HOSPITAL_COMMUNITY): Payer: Self-pay | Admitting: *Deleted

## 2020-06-29 ENCOUNTER — Inpatient Hospital Stay (HOSPITAL_COMMUNITY)
Admission: EM | Admit: 2020-06-29 | Discharge: 2020-07-01 | DRG: 193 | Disposition: A | Discharge status: Home / Self Care | Payer: BC Managed Care – PPO | Attending: Family Medicine | Admitting: Family Medicine

## 2020-06-29 ENCOUNTER — Telehealth: Payer: BC Managed Care – PPO | Admitting: Orthopedic Surgery

## 2020-06-29 ENCOUNTER — Emergency Department (HOSPITAL_COMMUNITY): Payer: BC Managed Care – PPO

## 2020-06-29 DIAGNOSIS — R5383 Other fatigue: Secondary | ICD-10-CM | POA: Diagnosis not present

## 2020-06-29 DIAGNOSIS — T380X5A Adverse effect of glucocorticoids and synthetic analogues, initial encounter: Secondary | ICD-10-CM | POA: Diagnosis not present

## 2020-06-29 DIAGNOSIS — F17210 Nicotine dependence, cigarettes, uncomplicated: Secondary | ICD-10-CM | POA: Diagnosis present

## 2020-06-29 DIAGNOSIS — F419 Anxiety disorder, unspecified: Secondary | ICD-10-CM | POA: Diagnosis present

## 2020-06-29 DIAGNOSIS — G44229 Chronic tension-type headache, not intractable: Secondary | ICD-10-CM | POA: Diagnosis present

## 2020-06-29 DIAGNOSIS — Z9851 Tubal ligation status: Secondary | ICD-10-CM

## 2020-06-29 DIAGNOSIS — Z20822 Contact with and (suspected) exposure to covid-19: Secondary | ICD-10-CM | POA: Diagnosis present

## 2020-06-29 DIAGNOSIS — Z79891 Long term (current) use of opiate analgesic: Secondary | ICD-10-CM

## 2020-06-29 DIAGNOSIS — G8929 Other chronic pain: Secondary | ICD-10-CM | POA: Diagnosis present

## 2020-06-29 DIAGNOSIS — J189 Pneumonia, unspecified organism: Principal | ICD-10-CM | POA: Diagnosis present

## 2020-06-29 DIAGNOSIS — R0902 Hypoxemia: Secondary | ICD-10-CM

## 2020-06-29 DIAGNOSIS — D869 Sarcoidosis, unspecified: Secondary | ICD-10-CM | POA: Diagnosis present

## 2020-06-29 DIAGNOSIS — Z91048 Other nonmedicinal substance allergy status: Secondary | ICD-10-CM

## 2020-06-29 DIAGNOSIS — R0789 Other chest pain: Secondary | ICD-10-CM | POA: Diagnosis not present

## 2020-06-29 DIAGNOSIS — K8020 Calculus of gallbladder without cholecystitis without obstruction: Secondary | ICD-10-CM | POA: Diagnosis present

## 2020-06-29 DIAGNOSIS — E7800 Pure hypercholesterolemia, unspecified: Secondary | ICD-10-CM | POA: Diagnosis present

## 2020-06-29 DIAGNOSIS — R0602 Shortness of breath: Secondary | ICD-10-CM | POA: Diagnosis not present

## 2020-06-29 DIAGNOSIS — Z90710 Acquired absence of both cervix and uterus: Secondary | ICD-10-CM

## 2020-06-29 DIAGNOSIS — F418 Other specified anxiety disorders: Secondary | ICD-10-CM | POA: Diagnosis present

## 2020-06-29 DIAGNOSIS — Z8541 Personal history of malignant neoplasm of cervix uteri: Secondary | ICD-10-CM

## 2020-06-29 DIAGNOSIS — J069 Acute upper respiratory infection, unspecified: Secondary | ICD-10-CM

## 2020-06-29 DIAGNOSIS — R079 Chest pain, unspecified: Secondary | ICD-10-CM

## 2020-06-29 DIAGNOSIS — E876 Hypokalemia: Secondary | ICD-10-CM | POA: Diagnosis present

## 2020-06-29 DIAGNOSIS — R030 Elevated blood-pressure reading, without diagnosis of hypertension: Secondary | ICD-10-CM | POA: Diagnosis present

## 2020-06-29 DIAGNOSIS — J9691 Respiratory failure, unspecified with hypoxia: Secondary | ICD-10-CM | POA: Diagnosis present

## 2020-06-29 DIAGNOSIS — E669 Obesity, unspecified: Secondary | ICD-10-CM | POA: Diagnosis present

## 2020-06-29 DIAGNOSIS — E785 Hyperlipidemia, unspecified: Secondary | ICD-10-CM | POA: Diagnosis present

## 2020-06-29 DIAGNOSIS — Z79899 Other long term (current) drug therapy: Secondary | ICD-10-CM

## 2020-06-29 DIAGNOSIS — F32A Depression, unspecified: Secondary | ICD-10-CM | POA: Diagnosis present

## 2020-06-29 DIAGNOSIS — Z6838 Body mass index (BMI) 38.0-38.9, adult: Secondary | ICD-10-CM

## 2020-06-29 DIAGNOSIS — Z981 Arthrodesis status: Secondary | ICD-10-CM

## 2020-06-29 DIAGNOSIS — R609 Edema, unspecified: Secondary | ICD-10-CM | POA: Diagnosis present

## 2020-06-29 DIAGNOSIS — J8489 Other specified interstitial pulmonary diseases: Secondary | ICD-10-CM | POA: Diagnosis present

## 2020-06-29 DIAGNOSIS — M25512 Pain in left shoulder: Secondary | ICD-10-CM | POA: Diagnosis present

## 2020-06-29 DIAGNOSIS — Z87442 Personal history of urinary calculi: Secondary | ICD-10-CM

## 2020-06-29 DIAGNOSIS — Z8701 Personal history of pneumonia (recurrent): Secondary | ICD-10-CM

## 2020-06-29 DIAGNOSIS — J9601 Acute respiratory failure with hypoxia: Secondary | ICD-10-CM | POA: Diagnosis present

## 2020-06-29 DIAGNOSIS — I2699 Other pulmonary embolism without acute cor pulmonale: Secondary | ICD-10-CM | POA: Diagnosis present

## 2020-06-29 DIAGNOSIS — E559 Vitamin D deficiency, unspecified: Secondary | ICD-10-CM | POA: Diagnosis present

## 2020-06-29 LAB — CBC WITH DIFFERENTIAL/PLATELET
Abs Immature Granulocytes: 0.03 10*3/uL (ref 0.00–0.07)
Basophils Absolute: 0.1 10*3/uL (ref 0.0–0.1)
Basophils Relative: 1 %
Eosinophils Absolute: 0.3 10*3/uL (ref 0.0–0.5)
Eosinophils Relative: 3 %
HCT: 39.8 % (ref 36.0–46.0)
Hemoglobin: 13.1 g/dL (ref 12.0–15.0)
Immature Granulocytes: 0 %
Lymphocytes Relative: 21 %
Lymphs Abs: 2.2 10*3/uL (ref 0.7–4.0)
MCH: 30.6 pg (ref 26.0–34.0)
MCHC: 32.9 g/dL (ref 30.0–36.0)
MCV: 93 fL (ref 80.0–100.0)
Monocytes Absolute: 0.5 10*3/uL (ref 0.1–1.0)
Monocytes Relative: 5 %
Neutro Abs: 7.3 10*3/uL (ref 1.7–7.7)
Neutrophils Relative %: 70 %
Platelets: 262 10*3/uL (ref 150–400)
RBC: 4.28 MIL/uL (ref 3.87–5.11)
RDW: 14.6 % (ref 11.5–15.5)
WBC: 10.4 10*3/uL (ref 4.0–10.5)
nRBC: 0 % (ref 0.0–0.2)

## 2020-06-29 LAB — BASIC METABOLIC PANEL
Anion gap: 10 (ref 5–15)
BUN: 12 mg/dL (ref 6–20)
CO2: 26 mmol/L (ref 22–32)
Calcium: 8.6 mg/dL — ABNORMAL LOW (ref 8.9–10.3)
Chloride: 101 mmol/L (ref 98–111)
Creatinine, Ser: 0.89 mg/dL (ref 0.44–1.00)
GFR, Estimated: >60 mL/min (ref >60)
Glucose, Bld: 106 mg/dL — ABNORMAL HIGH (ref 70–99)
Potassium: 3.4 mmol/L — ABNORMAL LOW (ref 3.5–5.1)
Sodium: 137 mmol/L (ref 135–145)

## 2020-06-29 LAB — D-DIMER, QUANTITATIVE: D-Dimer, Quant: 0.97 ug/mL-FEU — ABNORMAL HIGH (ref 0.00–0.50)

## 2020-06-29 LAB — TROPONIN I (HIGH SENSITIVITY): Troponin I (High Sensitivity): 4 ng/L (ref <18)

## 2020-06-29 MED ORDER — FLUTICASONE PROPIONATE 50 MCG/ACT NA SUSP: 2.0000 | Freq: Every day | NASAL | 0 refills | Status: DC

## 2020-06-29 MED ORDER — BENZONATATE 100 MG PO CAPS: 100.0000 mg | ORAL_CAPSULE | Freq: Three times a day (TID) | ORAL | 0 refills | Status: DC | PRN

## 2020-06-29 NOTE — ED Triage Notes (Signed)
Pt with sob and fatigue since yesterday.  Denies cough or fever.

## 2020-06-29 NOTE — Progress Notes (Signed)
We are sorry you are not feeling well.  Here is how we plan to help!  Based on what you have shared with me, it looks like you may have a viral upper respiratory infection.  Upper respiratory infections are caused by a large number of viruses; however, rhinovirus is the most common cause.   HOWEVER, YOUR SYMPTOMS ARE CONSISTENT WITH COVID AND FLU, BOTH OF WHICH WE ARE SEEING A LOT OF RIGHT NOW. I STRONGLY SUGGEST YOU GET TESTED FOR COVID.  Symptoms vary from person to person, with common symptoms including sore throat, cough, fatigue or lack of energy and feeling of general discomfort.  A low-grade fever of up to 100.4 may present, but is often uncommon.  Symptoms vary however, and are closely related to a person's age or underlying illnesses.  The most common symptoms associated with an upper respiratory infection are nasal discharge or congestion, cough, sneezing, headache and pressure in the ears and face.  These symptoms usually persist for about 3 to 10 days, but can last up to 2 weeks.  It is important to know that upper respiratory infections do not cause serious illness or complications in most cases.    Upper respiratory infections can be transmitted from person to person, with the most common method of transmission being a person's hands.  The virus is able to live on the skin and can infect other persons for up to 2 hours after direct contact.  Also, these can be transmitted when someone coughs or sneezes; thus, it is important to cover the mouth to reduce this risk.  To keep the spread of the illness at [place], good hand hygiene is very important.  This is an infection that is most likely caused by a virus. There are no specific treatments other than to help you with the symptoms until the infection runs its course.  We are sorry you are not feeling well.  Here is how we plan to help!   For nasal congestion, you may use an oral decongestants such as Mucinex D or if you have glaucoma or high  blood pressure use plain Mucinex.  Saline nasal spray or nasal drops can help and can safely be used as often as needed for congestion.  For your congestion, I have prescribed Fluticasone nasal spray one spray in each nostril twice a day  If you do not have a history of heart disease, hypertension, diabetes or thyroid disease, prostate/bladder issues or glaucoma, you may also use Sudafed to treat nasal congestion.  It is highly recommended that you consult with a pharmacist or your primary care physician to ensure this medication is safe for you to take.     If you have a cough, you may use cough suppressants such as Delsym and Robitussin.  If you have glaucoma or high blood pressure, you can also use Coricidin HBP.   For cough I have prescribed for you A prescription cough medication called Tessalon Perles 100 mg. You may take 1-2 capsules every 8 hours as needed for cough  If you have a sore or scratchy throat, use a saltwater gargle-  to  teaspoon of salt dissolved in a 4-ounce to 8-ounce glass of warm water.  Gargle the solution for approximately 15-30 seconds and then spit.  It is important not to swallow the solution.  You can also use throat lozenges/cough drops and Chloraseptic spray to help with throat pain or discomfort.  Warm or cold liquids can also be helpful in relieving  throat pain.  For headache, pain or general discomfort, you can use Ibuprofen or Tylenol as directed.   Some authorities believe that zinc sprays or the use of Echinacea may shorten the course of your symptoms.   HOME CARE  Only take medications as instructed by your medical team.  Be sure to drink plenty of fluids. Water is fine as well as fruit juices, sodas and electrolyte beverages. You may want to stay away from caffeine or alcohol. If you are nauseated, try taking small sips of liquids. How do you know if you are getting enough fluid? Your urine should be a pale yellow or almost colorless.  Get  rest.  Taking a steamy shower or using a humidifier may help nasal congestion and ease sore throat pain. You can place a towel over your head and breathe in the steam from hot water coming from a faucet.  Using a saline nasal spray works much the same way.  Cough drops, hard candies and sore throat lozenges may ease your cough.  Avoid close contacts especially the very young and the elderly  Cover your mouth if you cough or sneeze  Always remember to wash your hands.   GET HELP RIGHT AWAY IF:  You develop worsening fever.  If your symptoms do not improve within 10 days  You develop yellow or green discharge from your nose over 3 days.  You have coughing fits  You develop a severe head ache or visual changes.  You develop shortness of breath, difficulty breathing or start having chest pain  Your symptoms persist after you have completed your treatment plan  MAKE SURE YOU   Understand these instructions.  Will watch your condition.  Will get help right away if you are not doing well or get worse.  Your e-visit answers were reviewed by a board certified advanced clinical practitioner to complete your personal care plan. Depending upon the condition, your plan could have included both over the counter or prescription medications. Please review your pharmacy choice. If there is a problem, you may call our nursing hot line at and have the prescription routed to another pharmacy. Your safety is important to [place]. If you have drug allergies check your prescription carefully.   You can use MyChart to ask questions about todays visit, request a non-urgent call back, or ask for a work or school excuse for 24 hours related to this e-Visit. If it has been greater than 24 hours you will need to follow up with your provider, or enter a new e-Visit to address those concerns. You will get an e-mail in the next two days asking about your experience.  I hope that your e-visit has been valuable  and will speed your recovery. Thank you for using e-visits.   Greater than 5 minutes, yet less than 10 minutes of time have been spent researching, coordinating and implementing care for this patient today.

## 2020-06-30 ENCOUNTER — Other Ambulatory Visit: Payer: Self-pay

## 2020-06-30 ENCOUNTER — Encounter (HOSPITAL_COMMUNITY): Payer: Self-pay | Admitting: Internal Medicine

## 2020-06-30 ENCOUNTER — Emergency Department (HOSPITAL_COMMUNITY): Payer: BC Managed Care – PPO

## 2020-06-30 DIAGNOSIS — R0602 Shortness of breath: Secondary | ICD-10-CM | POA: Diagnosis not present

## 2020-06-30 DIAGNOSIS — K8020 Calculus of gallbladder without cholecystitis without obstruction: Secondary | ICD-10-CM | POA: Diagnosis not present

## 2020-06-30 DIAGNOSIS — E7800 Pure hypercholesterolemia, unspecified: Secondary | ICD-10-CM | POA: Diagnosis present

## 2020-06-30 DIAGNOSIS — R0789 Other chest pain: Secondary | ICD-10-CM | POA: Diagnosis not present

## 2020-06-30 DIAGNOSIS — T380X5A Adverse effect of glucocorticoids and synthetic analogues, initial encounter: Secondary | ICD-10-CM | POA: Diagnosis not present

## 2020-06-30 DIAGNOSIS — E559 Vitamin D deficiency, unspecified: Secondary | ICD-10-CM | POA: Diagnosis present

## 2020-06-30 DIAGNOSIS — J9601 Acute respiratory failure with hypoxia: Secondary | ICD-10-CM | POA: Diagnosis present

## 2020-06-30 DIAGNOSIS — I2699 Other pulmonary embolism without acute cor pulmonale: Secondary | ICD-10-CM | POA: Diagnosis present

## 2020-06-30 DIAGNOSIS — E785 Hyperlipidemia, unspecified: Secondary | ICD-10-CM | POA: Diagnosis present

## 2020-06-30 DIAGNOSIS — F419 Anxiety disorder, unspecified: Secondary | ICD-10-CM | POA: Diagnosis present

## 2020-06-30 DIAGNOSIS — F17210 Nicotine dependence, cigarettes, uncomplicated: Secondary | ICD-10-CM | POA: Diagnosis present

## 2020-06-30 DIAGNOSIS — E876 Hypokalemia: Secondary | ICD-10-CM | POA: Diagnosis present

## 2020-06-30 DIAGNOSIS — F418 Other specified anxiety disorders: Secondary | ICD-10-CM | POA: Diagnosis present

## 2020-06-30 DIAGNOSIS — R030 Elevated blood-pressure reading, without diagnosis of hypertension: Secondary | ICD-10-CM | POA: Diagnosis present

## 2020-06-30 DIAGNOSIS — G44229 Chronic tension-type headache, not intractable: Secondary | ICD-10-CM | POA: Diagnosis present

## 2020-06-30 DIAGNOSIS — R609 Edema, unspecified: Secondary | ICD-10-CM | POA: Diagnosis present

## 2020-06-30 DIAGNOSIS — J189 Pneumonia, unspecified organism: Secondary | ICD-10-CM | POA: Diagnosis not present

## 2020-06-30 DIAGNOSIS — R0902 Hypoxemia: Secondary | ICD-10-CM | POA: Diagnosis not present

## 2020-06-30 DIAGNOSIS — F32A Depression, unspecified: Secondary | ICD-10-CM | POA: Diagnosis present

## 2020-06-30 DIAGNOSIS — Z90710 Acquired absence of both cervix and uterus: Secondary | ICD-10-CM | POA: Diagnosis not present

## 2020-06-30 DIAGNOSIS — Z20822 Contact with and (suspected) exposure to covid-19: Secondary | ICD-10-CM | POA: Diagnosis not present

## 2020-06-30 DIAGNOSIS — D869 Sarcoidosis, unspecified: Secondary | ICD-10-CM | POA: Diagnosis present

## 2020-06-30 DIAGNOSIS — J9691 Respiratory failure, unspecified with hypoxia: Secondary | ICD-10-CM

## 2020-06-30 DIAGNOSIS — M25512 Pain in left shoulder: Secondary | ICD-10-CM | POA: Diagnosis present

## 2020-06-30 DIAGNOSIS — J8489 Other specified interstitial pulmonary diseases: Secondary | ICD-10-CM | POA: Diagnosis present

## 2020-06-30 DIAGNOSIS — G8929 Other chronic pain: Secondary | ICD-10-CM | POA: Diagnosis present

## 2020-06-30 DIAGNOSIS — Z8541 Personal history of malignant neoplasm of cervix uteri: Secondary | ICD-10-CM | POA: Diagnosis not present

## 2020-06-30 DIAGNOSIS — E669 Obesity, unspecified: Secondary | ICD-10-CM | POA: Diagnosis present

## 2020-06-30 HISTORY — DX: Respiratory failure, unspecified with hypoxia: J96.91

## 2020-06-30 LAB — HEPATIC FUNCTION PANEL
ALT: 30 U/L (ref 0–44)
AST: 52 U/L — ABNORMAL HIGH (ref 15–41)
Albumin: 3.3 g/dL — ABNORMAL LOW (ref 3.5–5.0)
Alkaline Phosphatase: 114 U/L (ref 38–126)
Bilirubin, Direct: 0.2 mg/dL (ref 0.0–0.2)
Indirect Bilirubin: 0.5 mg/dL (ref 0.3–0.9)
Total Bilirubin: 0.7 mg/dL (ref 0.3–1.2)
Total Protein: 6.7 g/dL (ref 6.5–8.1)

## 2020-06-30 LAB — RESP PANEL BY RT-PCR (FLU A&B, COVID) ARPGX2
Influenza A by PCR: NEGATIVE
Influenza B by PCR: NEGATIVE
SARS Coronavirus 2 by RT PCR: NEGATIVE

## 2020-06-30 LAB — BRAIN NATRIURETIC PEPTIDE: B Natriuretic Peptide: 120 pg/mL — ABNORMAL HIGH (ref 0.0–100.0)

## 2020-06-30 LAB — PROCALCITONIN: Procalcitonin: 0.13 ng/mL

## 2020-06-30 LAB — TROPONIN I (HIGH SENSITIVITY): Troponin I (High Sensitivity): 5 ng/L (ref <18)

## 2020-06-30 LAB — MAGNESIUM: Magnesium: 1.9 mg/dL (ref 1.7–2.4)

## 2020-06-30 LAB — PHOSPHORUS: Phosphorus: 3.5 mg/dL (ref 2.5–4.6)

## 2020-06-30 MED ORDER — VITAMIN D 25 MCG (1000 UNIT) PO TABS
1000.0000 [IU] | ORAL_TABLET | Freq: Every day | ORAL | Status: DC
  Administered 2020-06-30 – 2020-07-01 (×2): 1000 [IU] via ORAL
  Filled 2020-06-30 (×2): qty 1

## 2020-06-30 MED ORDER — METHYLPREDNISOLONE SODIUM SUCC 125 MG IJ SOLR
125.0000 mg | Freq: Once | INTRAMUSCULAR | Status: AC
  Administered 2020-06-30: 09:00:00 125 mg via INTRAVENOUS
  Filled 2020-06-30: qty 2

## 2020-06-30 MED ORDER — SUMATRIPTAN SUCCINATE 50 MG PO TABS
100.0000 mg | ORAL_TABLET | ORAL | Status: DC | PRN
  Filled 2020-06-30: qty 1

## 2020-06-30 MED ORDER — OXYCODONE-ACETAMINOPHEN 10-325 MG PO TABS: 1.0000 | ORAL_TABLET | ORAL | Status: DC | PRN

## 2020-06-30 MED ORDER — RIZATRIPTAN BENZOATE 10 MG PO TBDP: 10.0000 mg | ORAL_TABLET | ORAL | Status: DC | PRN

## 2020-06-30 MED ORDER — OXYCODONE-ACETAMINOPHEN 5-325 MG PO TABS
1.0000 | ORAL_TABLET | ORAL | Status: DC | PRN
  Administered 2020-06-30 – 2020-07-01 (×2): 1 via ORAL
  Filled 2020-06-30 (×3): qty 1

## 2020-06-30 MED ORDER — KETOROLAC TROMETHAMINE 30 MG/ML IJ SOLN
30.0000 mg | Freq: Once | INTRAMUSCULAR | Status: AC
  Administered 2020-06-30: 01:00:00 30 mg via INTRAVENOUS
  Filled 2020-06-30: qty 1

## 2020-06-30 MED ORDER — ACETAMINOPHEN 325 MG PO TABS: 650.0000 mg | ORAL_TABLET | Freq: Four times a day (QID) | ORAL | Status: DC | PRN

## 2020-06-30 MED ORDER — OXYCODONE-ACETAMINOPHEN 5-325 MG PO TABS
1.0000 | ORAL_TABLET | ORAL | Status: DC | PRN
  Administered 2020-06-30 – 2020-07-01 (×2): 1 via ORAL
  Filled 2020-06-30: qty 1

## 2020-06-30 MED ORDER — IPRATROPIUM BROMIDE 0.02 % IN SOLN: 0.5000 mg | RESPIRATORY_TRACT | Status: DC | PRN

## 2020-06-30 MED ORDER — ONDANSETRON HCL 4 MG PO TABS: 4.0000 mg | ORAL_TABLET | Freq: Four times a day (QID) | ORAL | Status: DC | PRN

## 2020-06-30 MED ORDER — SODIUM CHLORIDE 0.9 % IV SOLN
500.0000 mg | Freq: Once | INTRAVENOUS | Status: AC
  Administered 2020-06-30: 04:00:00 500 mg via INTRAVENOUS
  Filled 2020-06-30: qty 500

## 2020-06-30 MED ORDER — BENZONATATE 100 MG PO CAPS: 100.0000 mg | ORAL_CAPSULE | Freq: Three times a day (TID) | ORAL | Status: DC | PRN

## 2020-06-30 MED ORDER — POTASSIUM CHLORIDE IN NACL 20-0.9 MEQ/L-% IV SOLN
INTRAVENOUS | Status: AC
  Filled 2020-06-30 (×2): qty 1000

## 2020-06-30 MED ORDER — PREGABALIN 75 MG PO CAPS
150.0000 mg | ORAL_CAPSULE | Freq: Two times a day (BID) | ORAL | Status: DC
  Administered 2020-06-30 – 2020-07-01 (×3): 150 mg via ORAL
  Filled 2020-06-30 (×3): qty 2

## 2020-06-30 MED ORDER — ROSUVASTATIN CALCIUM 10 MG PO TABS
5.0000 mg | ORAL_TABLET | Freq: Every day | ORAL | Status: DC
  Administered 2020-06-30 – 2020-07-01 (×2): 5 mg via ORAL
  Filled 2020-06-30 (×3): qty 1

## 2020-06-30 MED ORDER — AMITRIPTYLINE HCL 75 MG PO TABS
75.0000 mg | ORAL_TABLET | Freq: Every day | ORAL | Status: DC
  Administered 2020-06-30: 22:00:00 75 mg via ORAL
  Filled 2020-06-30 (×2): qty 1
  Filled 2020-06-30: qty 3
  Filled 2020-06-30: qty 1

## 2020-06-30 MED ORDER — SODIUM CHLORIDE 0.9 % IV SOLN
500.0000 mg | INTRAVENOUS | Status: DC
  Administered 2020-07-01: 09:00:00 500 mg via INTRAVENOUS
  Filled 2020-06-30: qty 500

## 2020-06-30 MED ORDER — ALBUTEROL SULFATE (2.5 MG/3ML) 0.083% IN NEBU: 2.5000 mg | INHALATION_SOLUTION | RESPIRATORY_TRACT | Status: DC | PRN

## 2020-06-30 MED ORDER — POTASSIUM CHLORIDE CRYS ER 20 MEQ PO TBCR
40.0000 meq | EXTENDED_RELEASE_TABLET | Freq: Once | ORAL | Status: AC
  Administered 2020-06-30: 05:00:00 40 meq via ORAL
  Filled 2020-06-30: qty 2

## 2020-06-30 MED ORDER — IOHEXOL 350 MG/ML SOLN
100.0000 mL | Freq: Once | INTRAVENOUS | Status: AC | PRN
  Administered 2020-06-30: 02:00:00 100 mL via INTRAVENOUS

## 2020-06-30 MED ORDER — OXYCODONE HCL 5 MG PO TABS
5.0000 mg | ORAL_TABLET | ORAL | Status: DC | PRN
  Administered 2020-06-30: 5 mg via ORAL

## 2020-06-30 MED ORDER — CYCLOBENZAPRINE HCL 10 MG PO TABS
10.0000 mg | ORAL_TABLET | Freq: Three times a day (TID) | ORAL | Status: DC
  Administered 2020-06-30 – 2020-07-01 (×5): 10 mg via ORAL
  Filled 2020-06-30 (×5): qty 1

## 2020-06-30 MED ORDER — SODIUM CHLORIDE 0.9 % IV SOLN
1.0000 g | INTRAVENOUS | Status: DC
  Administered 2020-07-01: 06:00:00 1 g via INTRAVENOUS
  Filled 2020-06-30: qty 10

## 2020-06-30 MED ORDER — FLUTICASONE PROPIONATE 50 MCG/ACT NA SUSP
2.0000 | Freq: Every day | NASAL | Status: DC
  Administered 2020-06-30 – 2020-07-01 (×2): 2 via NASAL
  Filled 2020-06-30 (×2): qty 16

## 2020-06-30 MED ORDER — ENOXAPARIN SODIUM 40 MG/0.4ML ~~LOC~~ SOLN
40.0000 mg | SUBCUTANEOUS | Status: DC
  Administered 2020-06-30 – 2020-07-01 (×2): 40 mg via SUBCUTANEOUS
  Filled 2020-06-30 (×2): qty 0.4

## 2020-06-30 MED ORDER — OXYCODONE HCL ER 10 MG PO T12A
10.0000 mg | EXTENDED_RELEASE_TABLET | Freq: Two times a day (BID) | ORAL | Status: DC
  Administered 2020-06-30 – 2020-07-01 (×3): 10 mg via ORAL
  Filled 2020-06-30 (×3): qty 1

## 2020-06-30 MED ORDER — OXYCODONE HCL 5 MG PO TABS
5.0000 mg | ORAL_TABLET | ORAL | Status: DC | PRN
Start: 2020-06-30 — End: 2020-07-01
  Administered 2020-06-30 – 2020-07-01 (×2): 5 mg via ORAL
  Filled 2020-06-30 (×3): qty 1

## 2020-06-30 MED ORDER — KETOROLAC TROMETHAMINE 30 MG/ML IJ SOLN
30.0000 mg | Freq: Once | INTRAMUSCULAR | Status: AC
  Administered 2020-06-30: 09:00:00 30 mg via INTRAVENOUS
  Filled 2020-06-30: qty 1

## 2020-06-30 MED ORDER — METHYLPREDNISOLONE SODIUM SUCC 40 MG IJ SOLR
40.0000 mg | Freq: Three times a day (TID) | INTRAMUSCULAR | Status: DC
  Administered 2020-06-30 – 2020-07-01 (×2): 40 mg via INTRAVENOUS
  Filled 2020-06-30 (×2): qty 1

## 2020-06-30 MED ORDER — ONDANSETRON HCL 4 MG/2ML IJ SOLN: 4.0000 mg | Freq: Four times a day (QID) | INTRAMUSCULAR | Status: DC | PRN

## 2020-06-30 MED ORDER — BUSPIRONE HCL 5 MG PO TABS
5.0000 mg | ORAL_TABLET | Freq: Two times a day (BID) | ORAL | Status: DC
  Administered 2020-06-30 – 2020-07-01 (×3): 5 mg via ORAL
  Filled 2020-06-30 (×3): qty 1

## 2020-06-30 MED ORDER — SODIUM CHLORIDE 0.9 % IV SOLN
1.0000 g | Freq: Once | INTRAVENOUS | Status: AC
  Administered 2020-06-30: 03:00:00 1 g via INTRAVENOUS
  Filled 2020-06-30: qty 10

## 2020-06-30 MED ORDER — ACETAMINOPHEN 650 MG RE SUPP: 650.0000 mg | Freq: Four times a day (QID) | RECTAL | Status: DC | PRN

## 2020-06-30 NOTE — ED Provider Notes (Signed)
[facility] EMERGENCY DEPARTMENT Provider Note   CSN: [id number] Arrival date & time: 06/29/20  1919   Time seen 12:29 AM  History Chief Complaint  Patient presents with  . Shortness of Breath    April Ayala is a 44 y.o. female.  HPI   Patient states December 24 she would fall asleep while walking at work.  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  She did not fall.  She states today she got short of breath and she started having a new chest pain in the center of her chest.  She states she was diagnosed with pneumonia 3 or 4 weeks ago and she has had some left-sided chest pain since that time.  However since 2 AM on December 25 she has had a constant achy pressure feeling in the center of her chest that hurts more with movement.  She has never had that before.  She still has a mild cough but denies fever, sore throat, rhinorrhea, nausea, vomiting, or swelling of her legs.  She denies being on any type of hormone treatment.  She does still smoke 3 to 4 cigarettes a day.  PCP Perlie Mayo, NP   Past Medical History:  Diagnosis Date  . Anxiety   . Cervical cancer ([place]) 04/2011   Stage 1B squamous cell  . Chronic left shoulder pain 10/07/2017  . Chronic tension-type headache, not intractable 10/07/2017  . Depression   . High cholesterol   . History of kidney stones    passed  . S/P lumbar fusion 04/20/2018    Patient Active Problem List   Diagnosis Date Noted  . Acute respiratory failure with hypoxia ([place]) 06/30/2020  . Prehypertension 06/26/2020  . Facial pain, acute 06/05/2020  . Tachycardia determined by examination of pulse 05/17/2020  . Soreness of tongue 04/23/2020  . Cold sore 04/11/2020  . Pain in joint of right elbow 03/20/2020  . Right tennis elbow 02/21/2020  . Cough with expectoration 02/14/2020  . Vaginal Pap smear following hysterectomy for malignancy 12/18/2019  . Screening for colorectal cancer 12/18/2019  . RLQ abdominal pain  12/18/2019  . History of cervical cancer 10/27/2019  . Environmental and seasonal allergies 09/29/2019  . Encounter for smoking cessation counseling 09/29/2019  . Binge eating 09/29/2019  . Migraine 09/29/2019  . Bilateral carpal tunnel syndrome 09/29/2019  . Encounter for screening mammogram for malignant neoplasm of breast 09/29/2019  . Obesity (BMI 30-39.9) 09/29/2019  . Family history of hyperlipidemia 09/29/2019  . Family history of diabetes mellitus 09/29/2019  . Family history of hypothyroidism 09/29/2019  . Other fatigue 09/29/2019  . Vitamin D deficiency 09/29/2019    Past Surgical History:  Procedure Laterality Date  . ABDOMINAL HYSTERECTOMY    . BREAST BIOPSY Right   . CERVICAL CONIZATION W/BX  03/24/2012   Procedure: CONIZATION CERVIX WITH BIOPSY;  Surgeon: Woodroe Mode, MD;  Location: [facility];  Service: Gynecology;  Laterality: N/A;  . DIAGNOSTIC LAPAROSCOPY  2007   ectopic  . ECTOPIC PREGNANCY SURGERY    . ESSURE TUBAL LIGATION  2010  . INSERTION OF SUPRAPUBIC CATHETER  05/03/2012   Procedure: INSERTION OF SUPRAPUBIC CATHETER;  Surgeon: Alvino Chapel, MD;  Location: [facility];  Service: Gynecology;;  . LEEP    . LUMBAR LAMINECTOMY/DECOMPRESSION MICRODISCECTOMY N/A 04/20/2018   Procedure: LUMBAR FIVE TO SACRAL ONE DECOMPRESSION AND FUSION, POSSIBLE LUMBAR FOUR TO SACRAL ONE; SCREW AND CAGE INSTRUMENTATION;  Surgeon: Melina Schools, MD;  Location: [facility]  OR;  Service: Orthopedics;  Laterality: N/A;  5 hrs  . LYMPHADENECTOMY  05/03/2012   Procedure: LYMPHADENECTOMY;  Surgeon: Alvino Chapel, MD;  Location: [facility];  Service: Gynecology;  Laterality: Bilateral;  pelvic  . RADICAL HYSTERECTOMY  05/03/2012   Procedure: RADICAL HYSTERECTOMY;  Surgeon: Alvino Chapel, MD;  Location: [facility];  Service: Gynecology;;  transposition of the ovaries  . SPINE SURGERY    . TUBAL LIGATION       OB History    Gravida  4   Para  4   Term  4   Preterm       AB      Living  4     SAB      IAB      Ectopic      Multiple      Live Births              Family History  Problem Relation Age of Onset  . Diabetes Mother   . Hypertension Father   . Diabetes Sister   . Migraines Sister   . Hypothyroidism Sister   . Migraines Sister   . Migraines Sister     Social History   Tobacco Use  . Smoking status: Former Smoker    Packs/day: 0.50    Years: 1.00    Pack years: 0.50    Types: Cigarettes    Start date: 11/03/2018  . Smokeless tobacco: Never Used  . Tobacco comment: smoking cessation information given  Vaping Use  . Vaping Use: Never used  Substance Use Topics  . Alcohol use: Never  . Drug use: Never  employed  Home Medications Prior to Admission medications   Medication Sig Start Date End Date Taking? Authorizing Provider  albuterol (VENTOLIN HFA) 108 (90 Base) MCG/ACT inhaler Inhale 1-2 puffs into the lungs every 6 (six) hours as needed for wheezing or shortness of breath. 05/27/20   Joy, Shawn C, PA-C  amitriptyline (ELAVIL) 75 MG tablet Take 1 tablet (75 mg total) by mouth at bedtime. 05/13/20   Perlie Mayo, NP  benzonatate (TESSALON) 100 MG capsule Take 1 capsule (100 mg total) by mouth 3 (three) times daily as needed for cough. 06/29/20   Lisette Abu, PA-C  busPIRone (BUSPAR) 5 MG tablet Take 1 tablet (5 mg total) by mouth 2 (two) times daily. 05/13/20   Fayrene Helper, MD  Cholecalciferol (VITAMIN D3) 1.25 MG (50000 UT) TABS Take 1 tablet by mouth daily.    [provider]  cyclobenzaprine (FLEXERIL) 10 MG tablet Take 10 mg by mouth 3 (three) times daily.     [provider]  fluticasone (FLONASE) 50 MCG/ACT nasal spray Place 2 sprays into both nostrils daily. 06/29/20   Lisette Abu, PA-C  Galcanezumab-gnlm (EMGALITY) 120 MG/ML SOAJ Inject 120 mg into the skin every 30 (thirty) days. 02/15/20   Melvenia Beam, MD  naproxen (NAPROSYN) 500 MG tablet Take 1 tablet (500 mg  total) by mouth 2 (two) times daily with a meal. 03/06/20   Sanjuana Kava, MD  oxyCODONE (OXYCONTIN) 10 mg 12 hr tablet Take 10 mg by mouth every 12 (twelve) hours.     [provider]  oxyCODONE ER (XTAMPZA ER) 9 MG C12A Xtampza ER 9 mg capsule sprinkle  Take 1 capsule every 12 hours by oral route.    [provider]  oxyCODONE-acetaminophen (PERCOCET) 10-325 MG tablet Take 1 tablet by mouth 4 (four) times daily as needed.  [provider]  phentermine (ADIPEX-P) 37.5 MG tablet Take 1 tablet (37.5 mg total) by mouth daily before breakfast. 06/26/20   Perlie Mayo, NP  pregabalin (LYRICA) 150 MG capsule Take 150 mg by mouth 2 (two) times daily. 05/20/20   [provider]  rizatriptan (MAXALT) 10 MG tablet Take 10 mg by mouth 2 (two) times daily as needed. 03/31/20   [provider]  rizatriptan (MAXALT-MLT) 10 MG disintegrating tablet Take 1 tablet (10 mg total) by mouth as needed for migraine. May repeat in 2 hours if needed 01/04/20   Melvenia Beam, MD  rosuvastatin (CRESTOR) 5 MG tablet Take 1 tablet (5 mg total) by mouth daily. 05/24/20   Perlie Mayo, NP  Vitamin D, Ergocalciferol, (DRISDOL) 1.25 MG (50000 UNIT) CAPS capsule Take 1 capsule (50,000 Units total) by mouth every 7 (seven) days. 10/10/19   Perlie Mayo, NP    Allergies    Patient has no known allergies.  Review of Systems   Review of Systems  All other systems reviewed and are negative.   Physical Exam Updated Vital Signs BP 121/69   Pulse (!) 114   Temp 98.8 F (37.1 C) (Oral)   Resp (!) 24   Ht 5\' 3"  (1.6 m)   Wt 98.9 kg   LMP 04/11/2012   SpO2 99%   BMI 38.62 kg/m   Physical Exam Vitals and nursing note reviewed.  Constitutional:      General: She is not in acute distress.    Appearance: Normal appearance. She is obese. She is not ill-appearing or toxic-appearing.  HENT:     Head: Normocephalic and atraumatic.     Right Ear: External ear normal.      Left Ear: External ear normal.  Eyes:     Extraocular Movements: Extraocular movements intact.     Conjunctiva/sclera: Conjunctivae normal.     Pupils: Pupils are equal, round, and reactive to light.  Cardiovascular:     Rate and Rhythm: Regular rhythm. Tachycardia present.  Pulmonary:     Effort: Pulmonary effort is normal.     Breath sounds: Normal breath sounds. Decreased air movement present. No wheezing, rhonchi or rales.     Comments: Although patient was not tender when I palpate her chest when she sat up she stated it hurt around her sternum. Chest:     Chest wall: No tenderness.       Comments: Patient states she has had left-sided chest pain since she was diagnosed of pneumonia 4 weeks ago, she states that he mid sternal pain is new today.  Areas are marked as above. Musculoskeletal:        General: No swelling.     Cervical back: Normal range of motion.  Skin:    General: Skin is warm and dry.     Findings: No rash.  Neurological:     General: No focal deficit present.     Mental Status: She is alert and oriented to person, place, and time.     Cranial Nerves: No cranial nerve deficit.  Psychiatric:        Mood and Affect: Mood normal.        Behavior: Behavior normal.        Thought Content: Thought content normal.     ED Results / Procedures / Treatments   Labs (all labs ordered are listed, but only abnormal results are displayed) Results for orders placed or performed during the hospital encounter of 06/29/20  Resp Panel by RT-PCR (Flu A&B, Covid) Nasopharyngeal Swab   Specimen: Nasopharyngeal Swab; Nasopharyngeal(NP) swabs in vial transport medium  Result Value Ref Range   SARS Coronavirus 2 by RT PCR NEGATIVE NEGATIVE   Influenza A by PCR NEGATIVE NEGATIVE   Influenza B by PCR NEGATIVE NEGATIVE  CBC with Differential  Result Value Ref Range   WBC 10.4 4.0 - 10.5 K/uL   RBC 4.28 3.87 - 5.11 MIL/uL   Hemoglobin 13.1 12.0 - 15.0 g/dL   HCT 39.8 36.0 - 46.0  %   MCV 93.0 80.0 - 100.0 fL   MCH 30.6 26.0 - 34.0 pg   MCHC 32.9 30.0 - 36.0 g/dL   RDW 14.6 11.5 - 15.5 %   Platelets 262 150 - 400 K/uL   nRBC 0.0 0.0 - 0.2 %   Neutrophils Relative % 70 %   Neutro Abs 7.3 1.7 - 7.7 K/uL   Lymphocytes Relative 21 %   Lymphs Abs 2.2 0.7 - 4.0 K/uL   Monocytes Relative 5 %   Monocytes Absolute 0.5 0.1 - 1.0 K/uL   Eosinophils Relative 3 %   Eosinophils Absolute 0.3 0.0 - 0.5 K/uL   Basophils Relative 1 %   Basophils Absolute 0.1 0.0 - 0.1 K/uL   Immature Granulocytes 0 %   Abs Immature Granulocytes 0.03 0.00 - 0.07 K/uL  Basic metabolic panel  Result Value Ref Range   Sodium 137 135 - 145 mmol/L   Potassium 3.4 (L) 3.5 - 5.1 mmol/L   Chloride 101 98 - 111 mmol/L   CO2 26 22 - 32 mmol/L   Glucose, Bld 106 (H) 70 - 99 mg/dL   BUN 12 6 - 20 mg/dL   Creatinine, Ser 0.89 0.44 - 1.00 mg/dL   Calcium 8.6 (L) 8.9 - 10.3 mg/dL   GFR, Estimated >60 >60 mL/min   Anion gap 10 5 - 15  D-dimer, quantitative  Result Value Ref Range   D-Dimer, Quant 0.97 (H) 0.00 - 0.50 ug/mL-FEU  Troponin I (High Sensitivity)  Result Value Ref Range   Troponin I (High Sensitivity) 4 <18 ng/L  Troponin I (High Sensitivity)  Result Value Ref Range   Troponin I (High Sensitivity) 5 <18 ng/L   Laboratory interpretation all normal except elevated D-dimer    EKG EKG Interpretation  Date/Time:  Saturday June 29 2020 19:52:59 EST Ventricular Rate:  122 PR Interval:  130 QRS Duration: 86 QT Interval:  324 QTC Calculation: 461 R Axis:   87 Text Interpretation: Sinus tachycardia with occasional Premature ventricular complexes Low voltage QRS Artifact No significant change since last tracing 27 May 2020 Confirmed by Rolland Porter [phone]) on 06/29/2020 11:07:49 PM   Radiology CT Angio Chest PE W/Cm &/Or Wo Cm  Result Date: 06/30/2020 CLINICAL DATA:  Worsening shortness of breath. EXAM: CT ANGIOGRAPHY CHEST WITH CONTRAST TECHNIQUE: Multidetector CT imaging of the  chest was performed using the standard protocol during bolus administration of intravenous contrast. Multiplanar CT image reconstructions and MIPs were obtained to evaluate the vascular anatomy. CONTRAST:  178mL OMNIPAQUE IOHEXOL 350 MG/ML SOLN COMPARISON:  May 27, 2020 FINDINGS: Cardiovascular: Satisfactory opacification of the pulmonary arteries to the segmental level. No evidence of pulmonary embolism. Normal heart size. No pericardial effusion. Mediastinum/Nodes: There is mild pretracheal and AP window lymphadenopathy. Thyroid gland, trachea, and esophagus demonstrate no significant findings. Lungs/Pleura: Marked severity diffuse, predominantly ground-glass appearing infiltrates are seen throughout both lungs. There is no evidence of a pleural effusion or pneumothorax. Upper Abdomen:  Ill-defined gallstones are seen within the gallbladder lumen. Musculoskeletal: No chest wall abnormality. No acute or significant osseous findings. Review of the MIP images confirms the above findings. IMPRESSION: 1. Marked severity diffuse bilateral infiltrates. 2. Cholelithiasis. Electronically Signed   By: Virgina Norfolk M.D.   On: 06/30/2020 02:26   DG Chest Portable 1 View  Result Date: 06/29/2020 CLINICAL DATA:  Shortness of breath and fatigue. EXAM: PORTABLE CHEST 1 VIEW COMPARISON:  May 27, 2020 FINDINGS: Decreased lung volumes are seen which is likely secondary to the degree of patient inspiration. Mild pulmonary vascular crowding is seen without evidence of focal consolidation, pleural effusion or pneumothorax. The heart size and mediastinal contours are within normal limits. The visualized skeletal structures are unremarkable. IMPRESSION: Low lung volumes without acute or active cardiopulmonary disease. Electronically Signed   By: Virgina Norfolk M.D.   On: 06/29/2020 20:22    Procedures .Critical Care Performed by: Rolland Porter, MD Authorized by: Rolland Porter, MD   Critical care provider  statement:    Critical care time (minutes):  39   Critical care was necessary to treat or prevent imminent or life-threatening deterioration of the following conditions:  Respiratory failure   Critical care was time spent personally by me on the following activities:  Obtaining history from patient or surrogate, ordering and review of laboratory studies, ordering and review of radiographic studies, pulse oximetry, re-evaluation of patient's condition and review of old charts   Care discussed with: admitting provider     (including critical care time)  CTA chest May 27, 2020 IMPRESSION: Multifocal, upper lobe predominant, peribronchovascular airspace opacities likely represent infection/inflammation. Recommend short-term follow-up in 2-3 months with CT chest to evaluate for resolution and exclude any underlying pathology. Electronically Signed   By: Iven Finn M.D.   On: 05/27/2020 17:00   Medications Ordered in ED Medications  cefTRIAXone (ROCEPHIN) 1 g in sodium chloride 0.9 % 100 mL IVPB (has no administration in time range)  azithromycin (ZITHROMAX) 500 mg in sodium chloride 0.9 % 250 mL IVPB (has no administration in time range)  ketorolac (TORADOL) 30 MG/ML injection 30 mg (30 mg Intravenous Given 06/30/20 0059)  iohexol (OMNIPAQUE) 350 MG/ML injection 100 mL (100 mLs Intravenous Contrast Given 06/30/20 0200)    ED Course  I have reviewed the triage vital signs and the nursing notes.  Pertinent labs & imaging results that were available during my care of the patient were reviewed by me and considered in my medical decision making (see chart for details).    MDM Rules/Calculators/A&P                          Patient was given Toradol for her chest pain.  CTA was done to look for PE.  She has a baseline tachycardia and elevated D-dimer.  Patient CTA shows bilateral basilar infiltrates.  Her pulse ox was 91% on room air when she arrived in triage.  I am going to have nursing  staff ambulate her to see if she gets hypoxic with ambulation.  Review her chart shows on November 22 she was evaluated for chest pain.  At that time she had multifocal upper lobe predominant airspace opacities.  She was treated as an outpatient with a Z-Pak and amoxicillin.  Nurses report with ambulation of less than 10 feet patient's pulse ox dropped to 86% on room air.  2:50 AM Dr. Olevia Bowens, hospitalist will admit.  We discussed her antibiotic treatment and  for now he just wants to start her on Rocephin and Zithromax for community-acquired pneumonia.  Review of her primary care office note on December 22 show she had a heart rate of 104 and 109.  She was started on #30 Adipex 375 mg for weight control.  Review of the [place] shows patient got Steward Drone ER 9 mg tablets on December 22, #60 OxyContin ER 10 mg tablets on December 16, and #120 oxycodone 10/325 on November 30.  She also gets #60 Lyrica 75 mg last filled December 23.  Patient states she takes these medications for chronic back pain.     Final Clinical Impression(s) / ED Diagnoses Final diagnoses:  Community acquired pneumonia, unspecified laterality  Hypoxia  Chest pain, unspecified type    Rx / DC Orders  Plan admission  Rolland Porter, MD, Barbette Or, MD 06/30/20 [phone]

## 2020-06-30 NOTE — Progress Notes (Signed)
Patient seen and evaluated, chart reviewed, please see EMR for updated orders. Please see full H&P dictated by admitting physician Dr. Olevia Bowens for same date of service.     Brief Summary:-  44 y.o. female with medical history significant of anxiety, depression, class II obesity, chronic left shoulder pain, chronic tension type headaches, cervical cancer, hyperlipidemia, cholelithiasis, prehypertension, history of lumbar spine fusion, chronic lower back pain who was readmitted on 06/30/2020 with acute hypoxic respiratory failure secondary to pulmonary infection/inflammation   A/p 1)Acute hypoxic respiratory failure--- ??  Reactive airway Versus pulmonary inflammation versus recurrent pneumonia ---- -Continue Rocephin and azithromycin, continue current management, -Incentive spirometry as requested, -Bronchodilators and mucolytics as ordered -Supplemental oxygen -Consult from pulmonologist requested -WBC 10.4----suspect WBC may trend up with steroid therapy -ESR and CRP pending -BNP is only 120 -Procalcitonin 0.13 -Troponin is not elevated -COVID-19 and influenza negative -D-dimer 0.97, but CTA chest without acute PE, CTA chest does show Marked severity diffuse bilateral infiltrates.  2)Class 2 obesity -Low calorie diet, portion control and increase physical activity discussed with patient -Body mass index is 38.62 kg/m.  3)  Prehypertension Lifestyle modifications. Follow-up with PCP.   4)Cholelithiasis--incidental finding, asymptomatic    5)Hyperlipidemia Continue Crestor 5 mg p.o. daily.    6)Anxiety with depression Continue amitriptyline 75 mg p.o. at bedtime. Continue BuSpar 5 mg p.o. twice daily.  -Total care time is 43 minutes  Patient seen and evaluated, chart reviewed, please see EMR for updated orders. Please see full H&P dictated by admitting physician Dr. Olevia Bowens for same date of service.

## 2020-06-30 NOTE — H&P (Signed)
History and Physical    April Ayala [id number] DOB: 1975-08-08 DOA: 06/29/2020  PCP: Perlie Mayo, NP   Patient coming from: Home.  I have personally briefly reviewed patient's old medical records in [place]  Chief Complaint: Shortness of breath and chest pain.  HPI: April Ayala is a 44 y.o. female with medical history significant of anxiety, depression, class II obesity, chronic left shoulder pain, chronic tension type headaches, cervical cancer, hyperlipidemia, cholelithiasis, prehypertension, history of lumbar spine fusion, chronic lower back pain who was recently discharged from the hospital about 3 to 4 weeks ago and is coming to the emergency department with persistent precordial/frontal chest wall pleuritic pain which has exacerbated a lot more since yesterday morning.  The pain is made worse by coughing or deep inspiration.  She denies hemoptysis.  She has also occasional wheezing.  She denies fever, chills, sore throat, rhinorrhea, palpitations, dizziness, diaphoresis, PND, orthopnea or recent pitting edema of the lower extremities.  No abdominal pain, nausea, vomiting, diarrhea, constipation, melena or hematochezia.  No dysuria, frequency hematuria.  No polyuria, polydipsia, polyphagia or blurred vision.  ED Course: Initial vital signs were temperature 98.8 F, pulse 121, respiration 18, blood pressure 139/116 mmHg O2 sat 91% on room air.  The patient received Toradol 30 mg IVP, ceftriaxone 1 g IV and azithromycin 500 mg IV.  Labwork: CBC was normal.  D-dimer was 0.97 mcg/mL.  Procalcitonin was 0.13 ng/mL.  Troponin was 4 and then 5 ng/mL.  BNP was 120.0 pg/mL.  Potassium is 3.4 mmol/L.  All other electrolytes are within expected range when calcium is corrected to albumin.  Renal function was normal.  LFTs show an albumin of 3.3 g/dL and AST of 52 units/L, the rest of the hepatic function tests are within normal limits.  Imaging: Portable chest radiograph  showed low lung volumes without acute or active cardiopulmonary disease.  CTA chest showed marked severity diffuse bilateral infiltrates and cholelithiasis, but no PE.  Please see images imported report for further detail.  Review of Systems: As per HPI otherwise all other systems reviewed and are negative.  Past Medical History:  Diagnosis Date  . Anxiety   . Cervical cancer ([place]) 04/2011   Stage 1B squamous cell  . Chronic left shoulder pain 10/07/2017  . Chronic tension-type headache, not intractable 10/07/2017  . Class 2 obesity 09/29/2019  . Depression   . High cholesterol   . History of kidney stones    passed  . Prehypertension 06/26/2020  . S/P lumbar fusion 04/20/2018    Past Surgical History:  Procedure Laterality Date  . ABDOMINAL HYSTERECTOMY    . BREAST BIOPSY Right   . CERVICAL CONIZATION W/BX  03/24/2012   Procedure: CONIZATION CERVIX WITH BIOPSY;  Surgeon: Woodroe Mode, MD;  Location: [facility];  Service: Gynecology;  Laterality: N/A;  . DIAGNOSTIC LAPAROSCOPY  2007   ectopic  . ECTOPIC PREGNANCY SURGERY    . ESSURE TUBAL LIGATION  2010  . INSERTION OF SUPRAPUBIC CATHETER  05/03/2012   Procedure: INSERTION OF SUPRAPUBIC CATHETER;  Surgeon: Alvino Chapel, MD;  Location: [facility];  Service: Gynecology;;  . LEEP    . LUMBAR LAMINECTOMY/DECOMPRESSION MICRODISCECTOMY N/A 04/20/2018   Procedure: LUMBAR FIVE TO SACRAL ONE DECOMPRESSION AND FUSION, POSSIBLE LUMBAR FOUR TO SACRAL ONE; SCREW AND CAGE INSTRUMENTATION;  Surgeon: Melina Schools, MD;  Location: [facility];  Service: Orthopedics;  Laterality: N/A;  5 hrs  . LYMPHADENECTOMY  05/03/2012   Procedure: LYMPHADENECTOMY;  Surgeon: Alvino Chapel, MD;  Location: [facility];  Service: Gynecology;  Laterality: Bilateral;  pelvic  . RADICAL HYSTERECTOMY  05/03/2012   Procedure: RADICAL HYSTERECTOMY;  Surgeon: Alvino Chapel, MD;  Location: [facility];  Service: Gynecology;;  transposition of the ovaries  . SPINE  SURGERY    . TUBAL LIGATION      Social History  reports that she has quit smoking. Her smoking use included cigarettes. She started smoking about 19 months ago. She has a 0.50 pack-year smoking history. She has never used smokeless tobacco. She reports that she does not drink alcohol and does not use drugs.  No Known Allergies  Family History  Problem Relation Age of Onset  . Diabetes Mother   . Hypertension Father   . Diabetes Sister   . Migraines Sister   . Hypothyroidism Sister   . Migraines Sister   . Migraines Sister    Prior to Admission medications   Medication Sig Start Date End Date Taking? Authorizing Provider  albuterol (VENTOLIN HFA) 108 (90 Base) MCG/ACT inhaler Inhale 1-2 puffs into the lungs every 6 (six) hours as needed for wheezing or shortness of breath. 05/27/20   Joy, Shawn C, PA-C  amitriptyline (ELAVIL) 75 MG tablet Take 1 tablet (75 mg total) by mouth at bedtime. 05/13/20   Perlie Mayo, NP  benzonatate (TESSALON) 100 MG capsule Take 1 capsule (100 mg total) by mouth 3 (three) times daily as needed for cough. 06/29/20   Lisette Abu, PA-C  busPIRone (BUSPAR) 5 MG tablet Take 1 tablet (5 mg total) by mouth 2 (two) times daily. 05/13/20   Fayrene Helper, MD  Cholecalciferol (VITAMIN D3) 1.25 MG (50000 UT) TABS Take 1 tablet by mouth daily.    [provider]  cyclobenzaprine (FLEXERIL) 10 MG tablet Take 10 mg by mouth 3 (three) times daily.     [provider]  fluticasone (FLONASE) 50 MCG/ACT nasal spray Place 2 sprays into both nostrils daily. 06/29/20   Lisette Abu, PA-C  Galcanezumab-gnlm (EMGALITY) 120 MG/ML SOAJ Inject 120 mg into the skin every 30 (thirty) days. 02/15/20   Melvenia Beam, MD  naproxen (NAPROSYN) 500 MG tablet Take 1 tablet (500 mg total) by mouth 2 (two) times daily with a meal. 03/06/20   Sanjuana Kava, MD  oxyCODONE (OXYCONTIN) 10 mg 12 hr tablet Take 10 mg by mouth every 12 (twelve) hours.      [provider]  oxyCODONE ER (XTAMPZA ER) 9 MG C12A Xtampza ER 9 mg capsule sprinkle  Take 1 capsule every 12 hours by oral route.    [provider]  oxyCODONE-acetaminophen (PERCOCET) 10-325 MG tablet Take 1 tablet by mouth 4 (four) times daily as needed.     [provider]  phentermine (ADIPEX-P) 37.5 MG tablet Take 1 tablet (37.5 mg total) by mouth daily before breakfast. 06/26/20   Perlie Mayo, NP  pregabalin (LYRICA) 150 MG capsule Take 150 mg by mouth 2 (two) times daily. 05/20/20   [provider]  rizatriptan (MAXALT) 10 MG tablet Take 10 mg by mouth 2 (two) times daily as needed. 03/31/20   [provider]  rizatriptan (MAXALT-MLT) 10 MG disintegrating tablet Take 1 tablet (10 mg total) by mouth as needed for migraine. May repeat in 2 hours if needed 01/04/20   Melvenia Beam, MD  rosuvastatin (CRESTOR) 5 MG tablet Take 1 tablet (5 mg total) by mouth daily. 05/24/20   Perlie Mayo,  NP  Vitamin D, Ergocalciferol, (DRISDOL) 1.25 MG (50000 UNIT) CAPS capsule Take 1 capsule (50,000 Units total) by mouth every 7 (seven) days. 10/10/19   Perlie Mayo, NP    Physical Exam: Vitals:   06/29/20 1956 06/29/20 2230 06/30/20 0100 06/30/20 0130  BP: (!) 139/116 101/69 121/69 (!) 107/59  Pulse: (!) 121 (!) 124 (!) 114 (!) 113  Resp: 18 (!) 30 (!) 24   Temp: 98.8 F (37.1 C)     TempSrc: Oral     SpO2: 91% 91% 99% 99%  Weight:      Height:        Constitutional: NAD, calm, comfortable Eyes: PERRL, lids and conjunctivae normal ENMT: Mucous membranes are mildly dry. Posterior pharynx clear of any exudate or lesions. Neck: normal, supple, no masses, no thyromegaly Respiratory: Tachypneic with RR in the low 20s, decreased breath sounds bilaterally with rhonchi, no crackles.. No accessory muscle use.  Cardiovascular: Tachycardic at 107 bpm, no murmurs / rubs / gallops. No extremity edema. 2+ pedal pulses. No carotid bruits.  Abdomen: Obese,  nondistended.Bowel sounds positive.  Soft, no tenderness, no masses palpated. No hepatosplenomegaly.  Musculoskeletal: Generalized weakness.  No clubbing / cyanosis.  Good ROM, no contractures. Normal muscle tone.  Skin: no rashes, lesions, ulcers on very limited dermatological examination. Neurologic: CN 2-12 grossly intact. Sensation intact, DTR normal. Strength 5/5 in all 4.  Psychiatric: Normal judgment and insight. Alert and oriented x 3. Normal mood.   Labs on Admission: I have personally reviewed following labs and imaging studies  CBC: Recent Labs  Lab 06/29/20 2225  WBC 10.4  NEUTROABS 7.3  HGB 13.1  HCT 39.8  MCV 93.0  PLT 366    Basic Metabolic Panel: Recent Labs  Lab 06/29/20 2225  NA 137  K 3.4*  CL 101  CO2 26  GLUCOSE 106*  BUN 12  CREATININE 0.89  CALCIUM 8.6*    GFR: Estimated Creatinine Clearance: 92.3 mL/min (by C-G formula based on SCr of 0.89 mg/dL).  Liver Function Tests: No results for input(s): AST, ALT, ALKPHOS, BILITOT, PROT, ALBUMIN in the last 168 hours.  Radiological Exams on Admission: CT Angio Chest PE W/Cm &/Or Wo Cm  Result Date: 06/30/2020 CLINICAL DATA:  Worsening shortness of breath. EXAM: CT ANGIOGRAPHY CHEST WITH CONTRAST TECHNIQUE: Multidetector CT imaging of the chest was performed using the standard protocol during bolus administration of intravenous contrast. Multiplanar CT image reconstructions and MIPs were obtained to evaluate the vascular anatomy. CONTRAST:  165mL OMNIPAQUE IOHEXOL 350 MG/ML SOLN COMPARISON:  May 27, 2020 FINDINGS: Cardiovascular: Satisfactory opacification of the pulmonary arteries to the segmental level. No evidence of pulmonary embolism. Normal heart size. No pericardial effusion. Mediastinum/Nodes: There is mild pretracheal and AP window lymphadenopathy. Thyroid gland, trachea, and esophagus demonstrate no significant findings. Lungs/Pleura: Marked severity diffuse, predominantly ground-glass appearing  infiltrates are seen throughout both lungs. There is no evidence of a pleural effusion or pneumothorax. Upper Abdomen: Ill-defined gallstones are seen within the gallbladder lumen. Musculoskeletal: No chest wall abnormality. No acute or significant osseous findings. Review of the MIP images confirms the above findings. IMPRESSION: 1. Marked severity diffuse bilateral infiltrates. 2. Cholelithiasis. Electronically Signed   By: Virgina Norfolk M.D.   On: 06/30/2020 02:26   DG Chest Portable 1 View  Result Date: 06/29/2020 CLINICAL DATA:  Shortness of breath and fatigue. EXAM: PORTABLE CHEST 1 VIEW COMPARISON:  May 27, 2020 FINDINGS: Decreased lung volumes are seen which is likely secondary to the degree  of patient inspiration. Mild pulmonary vascular crowding is seen without evidence of focal consolidation, pleural effusion or pneumothorax. The heart size and mediastinal contours are within normal limits. The visualized skeletal structures are unremarkable. IMPRESSION: Low lung volumes without acute or active cardiopulmonary disease. Electronically Signed   By: Virgina Norfolk M.D.   On: 06/29/2020 20:22    EKG: Independently reviewed Vent. rate 122 BPM PR interval 130 ms QRS duration 86 ms QT/QTc 324/461 ms P-R-T axes 66 87 12 Sinus tachycardia with occasional Premature ventricular complexes Low voltage QRS Artifact  Assessment/Plan Principal Problem:   Acute respiratory failure with hypoxia (HCC) Recent pneumonia with reactive airways. Patient is a former smoker. Observation/telemetry. Normal procalcitonin Defer further antibiotics. Incentive spirometry. Continue supplemental oxygen. Continue bronchodilators. Analgesics as needed for pleuritic CP. Solu-Medrol 125 mg IVP x1 dose.  Active Problems:   Class 2 obesity Significant lifestyle modifications needed. Follow-up with primary care provider. May benefit from outpatient dietitian consult.    Prehypertension Lifestyle  modifications. Follow-up with PCP.    Hypokalemia Replacing. Follow potassium level as needed.    Hyperlipidemia Continue Crestor 5 mg p.o. daily.    Anxiety with depression Continue amitriptyline 75 mg p.o. at bedtime. Continue BuSpar 5 mg p.o. twice daily.   DVT prophylaxis: Lovenox SQ. Code Status:   Full code. Family Communication: Disposition Plan:   Patient is from:  Home.  Anticipated DC to:  Home.  Anticipated DC date:  05/01/2020.  Anticipated DC barriers: Clinical status.  Consults called: Admission status:  Observation/telemetry.  Severity of Illness:  High due to easily induced desaturation with minimal exertion.  Reubin Milan MD [facility]  How to contact the [facility] Attending or Consulting provider [place] or covering provider during after hours [place], for this patient?   1. Check the care team in [facility] and look for a) attending/consulting [facility] provider listed and b) the [facility] team listed 2. Log into [email or web address] and use [place]'s universal password to access. If you do not have the password, please contact the hospital operator. 3. Locate the [facility] provider you are looking for under [facility] and page to a number that you can be directly reached. 4. If you still have difficulty reaching the provider, please page the [facility] (Director on Call) for the Hospitalists listed on amion for assistance.  06/30/2020, 4:17 AM   This document was prepared using [employer] and may contain some unintended transcription errors.

## 2020-06-30 NOTE — ED Notes (Signed)
Report given to 300. 

## 2020-06-30 NOTE — ED Notes (Signed)
ED TO INPATIENT HANDOFF REPORT  ED Nurse Name and Phone #: [phone]  S Name/Age/Gender April Ayala 44 y.o. female Room/Bed: APA19/APA19  Code Status   Code Status: Full Code  Home/SNF/Other Home Patient oriented to: self Is this baseline? Yes   Triage Complete: Triage complete  Chief Complaint Acute respiratory failure with hypoxia ([place]) [J96.01] Respiratory failure with hypoxia ([place]) [J96.91]  Triage Note Pt with sob and fatigue since yesterday.  Denies cough or fever.    Allergies No Known Allergies  Level of Care/Admitting Diagnosis ED Disposition    ED Disposition Condition [facility] Area: [facility] [id number]  Level of Care: Telemetry [5]  Covid Evaluation: Asymptomatic Screening Protocol (No Symptoms)  Diagnosis: [id number]  Admitting Physician: Morrison Old  Attending Physician: Morrison Old  Estimated length of stay: 3 - 4 days  Certification:: I certify this patient will need inpatient services for at least 2 midnights       B Medical/Surgery History Past Medical History:  Diagnosis Date  . Anxiety   . Cervical cancer ([place]) 04/2011   Stage 1B squamous cell  . Chronic left shoulder pain 10/07/2017  . Chronic tension-type headache, not intractable 10/07/2017  . Class 2 obesity 09/29/2019  . Depression   . High cholesterol   . History of kidney stones    passed  . Prehypertension 06/26/2020  . S/P lumbar fusion 04/20/2018   Past Surgical History:  Procedure Laterality Date  . ABDOMINAL HYSTERECTOMY    . BREAST BIOPSY Right   . CERVICAL CONIZATION W/BX  03/24/2012   Procedure: CONIZATION CERVIX WITH BIOPSY;  Surgeon: Woodroe Mode, MD;  Location: [facility];  Service: Gynecology;  Laterality: N/A;  . DIAGNOSTIC LAPAROSCOPY  2007   ectopic  . ECTOPIC PREGNANCY SURGERY    . ESSURE TUBAL LIGATION  2010  . INSERTION OF SUPRAPUBIC CATHETER  05/03/2012    Procedure: INSERTION OF SUPRAPUBIC CATHETER;  Surgeon: Alvino Chapel, MD;  Location: [facility];  Service: Gynecology;;  . LEEP    . LUMBAR LAMINECTOMY/DECOMPRESSION MICRODISCECTOMY N/A 04/20/2018   Procedure: LUMBAR FIVE TO SACRAL ONE DECOMPRESSION AND FUSION, POSSIBLE LUMBAR FOUR TO SACRAL ONE; SCREW AND CAGE INSTRUMENTATION;  Surgeon: Melina Schools, MD;  Location: [facility];  Service: Orthopedics;  Laterality: N/A;  5 hrs  . LYMPHADENECTOMY  05/03/2012   Procedure: LYMPHADENECTOMY;  Surgeon: Alvino Chapel, MD;  Location: [facility];  Service: Gynecology;  Laterality: Bilateral;  pelvic  . RADICAL HYSTERECTOMY  05/03/2012   Procedure: RADICAL HYSTERECTOMY;  Surgeon: Alvino Chapel, MD;  Location: [facility];  Service: Gynecology;;  transposition of the ovaries  . SPINE SURGERY    . TUBAL LIGATION       A IV Location/Drains/Wounds Patient Lines/Drains/Airways Status    Active Line/Drains/Airways    Name Placement date Placement time Site Days   Peripheral IV 06/29/20 Left Antecubital 06/29/20  2225  Antecubital  1   Peripheral IV 06/30/20 Right Hand 06/30/20  1230  Hand  less than 1   Incision (Closed) 04/20/18 Back 04/20/18  1814  -- 802          Intake/Output Last 24 hours  Intake/Output Summary (Last 24 hours) at 06/30/2020 1704 Last data filed at 06/30/2020 1520 Gross per 24 hour  Intake 2139.35 ml  Output --  Net 2139.35 ml    Labs/Imaging Results for orders placed or performed during the hospital encounter of 06/29/20 (from  the past 48 hour(s))  CBC with Differential     Status: None   Collection Time: 06/29/20 10:25 PM  Result Value Ref Range   WBC 10.4 4.0 - 10.5 K/uL   RBC 4.28 3.87 - 5.11 MIL/uL   Hemoglobin 13.1 12.0 - 15.0 g/dL   HCT 39.8 36.0 - 46.0 %   MCV 93.0 80.0 - 100.0 fL   MCH 30.6 26.0 - 34.0 pg   MCHC 32.9 30.0 - 36.0 g/dL   RDW 14.6 11.5 - 15.5 %   Platelets 262 150 - 400 K/uL   nRBC 0.0 0.0 - 0.2 %   Neutrophils Relative % 70 %    Neutro Abs 7.3 1.7 - 7.7 K/uL   Lymphocytes Relative 21 %   Lymphs Abs 2.2 0.7 - 4.0 K/uL   Monocytes Relative 5 %   Monocytes Absolute 0.5 0.1 - 1.0 K/uL   Eosinophils Relative 3 %   Eosinophils Absolute 0.3 0.0 - 0.5 K/uL   Basophils Relative 1 %   Basophils Absolute 0.1 0.0 - 0.1 K/uL   Immature Granulocytes 0 %   Abs Immature Granulocytes 0.03 0.00 - 0.07 K/uL    Comment: Performed at [facility], [address], [place], [place] [address]  Troponin I (High Sensitivity)     Status: None   Collection Time: 06/29/20 10:25 PM  Result Value Ref Range   Troponin I (High Sensitivity) 4 <18 ng/L    Comment: (NOTE) Elevated high sensitivity troponin I (hsTnI) values and significant  changes across serial measurements may suggest ACS but many other  chronic and acute conditions are known to elevate hsTnI results.  Refer to the "Links" section for chest pain algorithms and additional  guidance. Performed at [facility], [address] [place], [place] [id number]   Basic metabolic panel     Status: Abnormal   Collection Time: 06/29/20 10:25 PM  Result Value Ref Range   Sodium 137 135 - 145 mmol/L   Potassium 3.4 (L) 3.5 - 5.1 mmol/L   Chloride 101 98 - 111 mmol/L   CO2 26 22 - 32 mmol/L   Glucose, Bld 106 (H) 70 - 99 mg/dL    Comment: Glucose reference range applies only to samples taken after fasting for at least 8 hours.   BUN 12 6 - 20 mg/dL   Creatinine, Ser 0.89 0.44 - 1.00 mg/dL   Calcium 8.6 (L) 8.9 - 10.3 mg/dL   GFR, Estimated >60 >60 mL/min    Comment: (NOTE) Calculated using the CKD-EPI Creatinine Equation (2021)    Anion gap 10 5 - 15    Comment: Performed at [facility], [address] [place], [place] [address]  D-dimer, quantitative     Status: Abnormal   Collection Time: 06/29/20 10:25 PM  Result Value Ref Range   D-Dimer, Quant 0.97 (H) 0.00 - 0.50 ug/mL-FEU    Comment: (NOTE) At the manufacturer cut-off value of 0.5 g/mL FEU, this assay has a negative  predictive value of 95-100%.This assay is intended for use in conjunction with a clinical pretest probability (PTP) assessment model to exclude pulmonary embolism (PE) and deep venous thrombosis (DVT) in outpatients suspected of PE or DVT. Results should be correlated with clinical presentation. Performed at [facility], [address] [place], [place] [address]   Resp Panel by RT-PCR (Flu A&B, Covid) Nasopharyngeal Swab     Status: None   Collection Time: 06/29/20 10:25 PM   Specimen: Nasopharyngeal Swab; Nasopharyngeal(NP) swabs in vial transport  medium  Result Value Ref Range   SARS Coronavirus 2 by RT PCR NEGATIVE NEGATIVE    Comment: (NOTE) SARS-CoV-2 target nucleic acids are NOT DETECTED.  The SARS-CoV-2 RNA is generally detectable in upper respiratory specimens during the acute phase of infection. The lowest concentration of SARS-CoV-2 viral copies this assay can detect is 138 copies/mL. A negative result does not preclude SARS-Cov-2 infection and should not be used as the sole basis for treatment or other patient management decisions. A negative result may occur with  improper specimen collection/handling, submission of specimen other than nasopharyngeal swab, presence of viral mutation(s) within the areas targeted by this assay, and inadequate number of viral copies(<138 copies/mL). A negative result must be combined with clinical observations, patient history, and epidemiological information. The expected result is Negative.  Fact Sheet for Patients:  EntrepreneurPulse.com.au  Fact Sheet for Healthcare Providers:  IncredibleEmployment.be  This test is no t yet approved or cleared by the [place] FDA and  has been authorized for detection and/or diagnosis of SARS-CoV-2 by FDA under an Emergency Use Authorization (EUA). This EUA will remain  in effect (meaning this test can be used) for the duration of the COVID-19 declaration  under Section 564(b)(1) of the Act, 21 U.S.C.section 360bbb-3(b)(1), unless the authorization is terminated  or revoked sooner.       Influenza A by PCR NEGATIVE NEGATIVE   Influenza B by PCR NEGATIVE NEGATIVE    Comment: (NOTE) The Xpert Xpress SARS-CoV-2/FLU/RSV plus assay is intended as an aid in the diagnosis of influenza from Nasopharyngeal swab specimens and should not be used as a sole basis for treatment. Nasal washings and aspirates are unacceptable for Xpert Xpress SARS-CoV-2/FLU/RSV testing.  Fact Sheet for Patients: EntrepreneurPulse.com.au  Fact Sheet for Healthcare Providers: IncredibleEmployment.be  This test is not yet approved or cleared by the [place] FDA and has been authorized for detection and/or diagnosis of SARS-CoV-2 by FDA under an Emergency Use Authorization (EUA). This EUA will remain in effect (meaning this test can be used) for the duration of the COVID-19 declaration under Section 564(b)(1) of the Act, 21 U.S.C. section 360bbb-3(b)(1), unless the authorization is terminated or revoked.  Performed at [facility], [address], [place], [place] [address]   Procalcitonin - Baseline     Status: None   Collection Time: 06/29/20 10:25 PM  Result Value Ref Range   Procalcitonin 0.13 ng/mL    Comment:        Interpretation: PCT (Procalcitonin) <= 0.5 ng/mL: Systemic infection (sepsis) is not likely. Local bacterial infection is possible. (NOTE)       Sepsis PCT Algorithm           Lower Respiratory Tract                                      Infection PCT Algorithm    ----------------------------     ----------------------------         PCT < 0.25 ng/mL                PCT < 0.10 ng/mL          Strongly encourage             Strongly discourage   discontinuation of antibiotics    initiation of antibiotics    ----------------------------     -----------------------------       PCT 0.25 - 0.50 ng/mL  PCT 0.10 - 0.25 ng/mL               OR       >80% decrease in PCT            Discourage initiation of                                            antibiotics      Encourage discontinuation           of antibiotics    ----------------------------     -----------------------------         PCT >= 0.50 ng/mL              PCT 0.26 - 0.50 ng/mL               AND        <80% decrease in PCT             Encourage initiation of                                             antibiotics       Encourage continuation           of antibiotics    ----------------------------     -----------------------------        PCT >= 0.50 ng/mL                  PCT > 0.50 ng/mL               AND         increase in PCT                  Strongly encourage                                      initiation of antibiotics    Strongly encourage escalation           of antibiotics                                     -----------------------------                                           PCT <= 0.25 ng/mL                                                 OR                                        > 80% decrease in PCT  Discontinue / Do not initiate                                             antibiotics  Performed at [facility], [address] [place], [place] [address]   Hepatic function panel     Status: Abnormal   Collection Time: 06/29/20 10:25 PM  Result Value Ref Range   Total Protein 6.7 6.5 - 8.1 g/dL   Albumin 3.3 (L) 3.5 - 5.0 g/dL   AST 52 (H) 15 - 41 U/L   ALT 30 0 - 44 U/L   Alkaline Phosphatase 114 38 - 126 U/L   Total Bilirubin 0.7 0.3 - 1.2 mg/dL   Bilirubin, Direct 0.2 0.0 - 0.2 mg/dL   Indirect Bilirubin 0.5 0.3 - 0.9 mg/dL    Comment: Performed at [facility], [address], [place], [place] [address]  Magnesium     Status: None   Collection Time: 06/29/20 10:25 PM  Result Value Ref Range   Magnesium 1.9 1.7 - 2.4 mg/dL    Comment: Performed at [facility], [address] [place], [place] [address]  Phosphorus     Status: None   Collection Time: 06/29/20 10:25 PM  Result Value Ref Range   Phosphorus 3.5 2.5 - 4.6 mg/dL    Comment: Performed at [facility], [address] [place], [place] [address]  Troponin I (High Sensitivity)     Status: None   Collection Time: 06/30/20 12:35 AM  Result Value Ref Range   Troponin I (High Sensitivity) 5 <18 ng/L    Comment: (NOTE) Elevated high sensitivity troponin I (hsTnI) values and significant  changes across serial measurements may suggest ACS but many other  chronic and acute conditions are known to elevate hsTnI results.  Refer to the "Links" section for chest pain algorithms and additional  guidance. Performed at [facility], [address], [place], [place] [address]   Brain natriuretic peptide     Status: Abnormal   Collection Time: 06/30/20  6:38 AM  Result Value Ref Range   B Natriuretic Peptide 120.0 (H) 0.0 - 100.0 pg/mL    Comment: Performed at [facility], [address] [place], [place] [address]   CT Angio Chest PE W/Cm &/Or Wo Cm  Result Date: 06/30/2020 CLINICAL DATA:  Worsening shortness of breath. EXAM: CT ANGIOGRAPHY CHEST WITH CONTRAST TECHNIQUE: Multidetector CT imaging of the chest was performed using the standard protocol during bolus administration of intravenous contrast. Multiplanar CT image reconstructions and MIPs were obtained to evaluate the vascular anatomy. CONTRAST:  164mL OMNIPAQUE IOHEXOL 350 MG/ML SOLN COMPARISON:  May 27, 2020 FINDINGS: Cardiovascular: Satisfactory opacification of the pulmonary arteries to the segmental level. No evidence of pulmonary embolism. Normal heart size. No pericardial effusion. Mediastinum/Nodes: There is mild pretracheal and AP window lymphadenopathy. Thyroid gland, trachea, and esophagus demonstrate no significant findings. Lungs/Pleura: Marked severity diffuse, predominantly ground-glass appearing infiltrates are seen  throughout both lungs. There is no evidence of a pleural effusion or pneumothorax. Upper Abdomen: Ill-defined gallstones are seen within the gallbladder lumen. Musculoskeletal: No chest wall abnormality. No acute or significant osseous findings. Review of the MIP images confirms the above findings. IMPRESSION: 1. Marked severity diffuse bilateral infiltrates. 2. Cholelithiasis. Electronically Signed   By: Virgina Norfolk M.D.   On: 06/30/2020 02:26   DG Chest Portable 1 View  Result  Date: 06/29/2020 CLINICAL DATA:  Shortness of breath and fatigue. EXAM: PORTABLE CHEST 1 VIEW COMPARISON:  May 27, 2020 FINDINGS: Decreased lung volumes are seen which is likely secondary to the degree of patient inspiration. Mild pulmonary vascular crowding is seen without evidence of focal consolidation, pleural effusion or pneumothorax. The heart size and mediastinal contours are within normal limits. The visualized skeletal structures are unremarkable. IMPRESSION: Low lung volumes without acute or active cardiopulmonary disease. Electronically Signed   By: Virgina Norfolk M.D.   On: 06/29/2020 20:22    Pending Labs Unresulted Labs (From admission, onward)          Start     Ordered   07/07/20 0500  Creatinine, serum  (enoxaparin (LOVENOX)    CrCl >/= 30 ml/min)  Weekly,   R     Comments: while on enoxaparin therapy    06/30/20 0409   07/01/20 0500  Comprehensive metabolic panel  Tomorrow morning,   R        06/30/20 0409   07/01/20 0500  CBC  Tomorrow morning,   R        06/30/20 0409   07/01/20 0500  Sedimentation rate  Tomorrow morning,   R        06/30/20 1225   07/01/20 0500  C-reactive protein  Tomorrow morning,   R        06/30/20 1225   06/30/20 0500  Procalcitonin  Daily,   R      06/30/20 0404          Vitals/Pain Today's Vitals   06/30/20 1200 06/30/20 1230 06/30/20 1330 06/30/20 1630  BP: 112/75 115/76 125/68 112/82  Pulse: 93 83 86 93  Resp: (!) 24 (!) 22 (!) 21 (!) 31  Temp:   98.2 F (36.8 C)    TempSrc:  Oral    SpO2: 97% 95% 96% 97%  Weight:      Height:      PainSc:        Isolation Precautions Airborne precautions  Medications Medications  enoxaparin (LOVENOX) injection 40 mg (40 mg Subcutaneous Given 06/30/20 0954)  0.9 % NaCl with KCl 20 mEq/ L  infusion ( Intravenous Stopped 06/30/20 1525)  acetaminophen (TYLENOL) tablet 650 mg (has no administration in time range)    Or  acetaminophen (TYLENOL) suppository 650 mg (has no administration in time range)  ondansetron (ZOFRAN) tablet 4 mg (has no administration in time range)    Or  ondansetron (ZOFRAN) injection 4 mg (has no administration in time range)  ipratropium (ATROVENT) nebulizer solution 0.5 mg (has no administration in time range)  albuterol (PROVENTIL) (2.5 MG/3ML) 0.083% nebulizer solution 2.5 mg (has no administration in time range)  oxyCODONE-acetaminophen (PERCOCET/ROXICET) 5-325 MG per tablet 1 tablet (1 tablet Oral Given 06/30/20 0538)    And  oxyCODONE (Oxy IR/ROXICODONE) immediate release tablet 5 mg (5 mg Oral Given 06/30/20 0538)  rosuvastatin (CRESTOR) tablet 5 mg (5 mg Oral Given 06/30/20 0955)  pregabalin (LYRICA) capsule 150 mg (150 mg Oral Given 06/30/20 0954)  oxyCODONE (OXYCONTIN) 12 hr tablet 10 mg (10 mg Oral Given 06/30/20 0954)  fluticasone (FLONASE) 50 MCG/ACT nasal spray 2 spray (2 sprays Each Nare Given 06/30/20 1540)  cyclobenzaprine (FLEXERIL) tablet 10 mg (10 mg Oral Given 06/30/20 1627)  busPIRone (BUSPAR) tablet 5 mg (5 mg Oral Given 06/30/20 0955)  cholecalciferol (VITAMIN D3) tablet 1,000 Units (1,000 Units Oral Given 06/30/20 0954)  benzonatate (TESSALON) capsule 100 mg (has no administration in  time range)  amitriptyline (ELAVIL) tablet 75 mg (has no administration in time range)  oxyCODONE-acetaminophen (PERCOCET/ROXICET) 5-325 MG per tablet 1 tablet (has no administration in time range)    And  oxyCODONE (Oxy IR/ROXICODONE) immediate release tablet 5  mg (has no administration in time range)  methylPREDNISolone sodium succinate (SOLU-MEDROL) 40 mg/mL injection 40 mg (40 mg Intravenous Given 06/30/20 1628)  azithromycin (ZITHROMAX) 500 mg in sodium chloride 0.9 % 250 mL IVPB (has no administration in time range)  cefTRIAXone (ROCEPHIN) 1 g in sodium chloride 0.9 % 100 mL IVPB (has no administration in time range)  SUMAtriptan (IMITREX) tablet 100 mg (has no administration in time range)  ketorolac (TORADOL) 30 MG/ML injection 30 mg (30 mg Intravenous Given 06/30/20 0059)  iohexol (OMNIPAQUE) 350 MG/ML injection 100 mL (100 mLs Intravenous Contrast Given 06/30/20 0200)  cefTRIAXone (ROCEPHIN) 1 g in sodium chloride 0.9 % 100 mL IVPB (0 g Intravenous Stopped 06/30/20 0352)  azithromycin (ZITHROMAX) 500 mg in sodium chloride 0.9 % 250 mL IVPB (0 mg Intravenous Stopped 06/30/20 0505)  potassium chloride SA (KLOR-CON) CR tablet 40 mEq (40 mEq Oral Given 06/30/20 0516)  methylPREDNISolone sodium succinate (SOLU-MEDROL) 125 mg/2 mL injection 125 mg (125 mg Intravenous Given 06/30/20 0856)  ketorolac (TORADOL) 30 MG/ML injection 30 mg (30 mg Intravenous Given 06/30/20 0856)    Mobility walks Low fall risk   Focused Assessments   R Recommendations: See Admitting Provider Note  Report given to:   Additional Notes:

## 2020-06-30 NOTE — ED Notes (Signed)
Patient ambulated from room to nurse's station on room air. Sats dropped to 86%. Patient stated "I feel short of breath". Patient returned to room and placed back on 3 L at this time. Dr. Tomi Bamberger notified.

## 2020-07-01 DIAGNOSIS — J9601 Acute respiratory failure with hypoxia: Secondary | ICD-10-CM | POA: Diagnosis not present

## 2020-07-01 LAB — COMPREHENSIVE METABOLIC PANEL
ALT: 23 U/L (ref 0–44)
AST: 32 U/L (ref 15–41)
Albumin: 2.9 g/dL — ABNORMAL LOW (ref 3.5–5.0)
Alkaline Phosphatase: 99 U/L (ref 38–126)
Anion gap: 10 (ref 5–15)
BUN: 14 mg/dL (ref 6–20)
CO2: 21 mmol/L — ABNORMAL LOW (ref 22–32)
Calcium: 8.7 mg/dL — ABNORMAL LOW (ref 8.9–10.3)
Chloride: 107 mmol/L (ref 98–111)
Creatinine, Ser: 0.64 mg/dL (ref 0.44–1.00)
GFR, Estimated: >60 mL/min (ref >60)
Glucose, Bld: 124 mg/dL — ABNORMAL HIGH (ref 70–99)
Potassium: 4.6 mmol/L (ref 3.5–5.1)
Sodium: 138 mmol/L (ref 135–145)
Total Bilirubin: 0.4 mg/dL (ref 0.3–1.2)
Total Protein: 6.4 g/dL — ABNORMAL LOW (ref 6.5–8.1)

## 2020-07-01 LAB — PROCALCITONIN: Procalcitonin: <0.1 ng/mL

## 2020-07-01 LAB — C-REACTIVE PROTEIN: CRP: 21.3 mg/dL — ABNORMAL HIGH (ref <1.0)

## 2020-07-01 LAB — CBC
HCT: 37.3 % (ref 36.0–46.0)
Hemoglobin: 12.1 g/dL (ref 12.0–15.0)
MCH: 30.3 pg (ref 26.0–34.0)
MCHC: 32.4 g/dL (ref 30.0–36.0)
MCV: 93.5 fL (ref 80.0–100.0)
Platelets: 321 10*3/uL (ref 150–400)
RBC: 3.99 MIL/uL (ref 3.87–5.11)
RDW: 14.4 % (ref 11.5–15.5)
WBC: 16.7 10*3/uL — ABNORMAL HIGH (ref 4.0–10.5)
nRBC: 0 % (ref 0.0–0.2)

## 2020-07-01 LAB — RAPID URINE DRUG SCREEN, HOSP PERFORMED
Amphetamines: NOT DETECTED
Barbiturates: NOT DETECTED
Benzodiazepines: NOT DETECTED
Cocaine: NOT DETECTED
Opiates: NOT DETECTED
Tetrahydrocannabinol: NOT DETECTED

## 2020-07-01 LAB — SEDIMENTATION RATE: Sed Rate: 75 mm/hr — ABNORMAL HIGH (ref 0–22)

## 2020-07-01 MED ORDER — PREDNISONE 10 MG PO TABS: 10.0000 mg | ORAL_TABLET | ORAL | 0 refills | Status: DC

## 2020-07-01 MED ORDER — ALBUTEROL SULFATE HFA 108 (90 BASE) MCG/ACT IN AERS: 2.0000 | INHALATION_SPRAY | RESPIRATORY_TRACT | 2 refills | Status: DC | PRN

## 2020-07-01 MED ORDER — ACETAMINOPHEN 325 MG PO TABS: 650.0000 mg | ORAL_TABLET | Freq: Four times a day (QID) | ORAL | 0 refills | Status: DC | PRN

## 2020-07-01 MED ORDER — FLUTICASONE FUROATE-VILANTEROL 200-25 MCG/INH IN AEPB: 1.0000 | INHALATION_SPRAY | Freq: Every day | RESPIRATORY_TRACT | 2 refills | Status: DC

## 2020-07-01 NOTE — Discharge Summary (Signed)
April Ayala, is a 44 y.o. female  DOB 10/05/1975  MRN [id number].  Admission date:  06/29/2020  Admitting Physician  Roxan Hockey, MD  Discharge Date:  07/01/2020   Primary MD  Perlie Mayo, NP  Recommendations for primary care physician for things to follow:   1)Prednisone Taper---Take Prednisone 4 tablets (81m) daily for 4 days, then 3 tab (342m daily for 4 days, then 2 tab (205mdaily x 3 days, then 10 mg (1 tablet) daily for 3 days then STOP --Always take with Food  2)You need oxygen at home at 2 L via nasal cannula continuously while awake and while asleep--- smoking or having open fires around oxygen can cause fire, significant injury and death  3)Follow - up with Dr. MicChristinia Gully- [place]: [id number] [phone]: ([phone] call [phone] needs to be seen in the [place], Not [place])Avoid ibuprofen/Advil/Aleve/Motrin/Goody Powders/Naproxen/BC powders/Meloxicam/Diclofenac/Indomethacin and other Nonsteroidal anti-inflammatory medications as these will make you more likely to bleed and can cause stomach ulcers, can also cause Kidney problems.   5)Smoking cessation counseling offered- Use Nicotine replacement therapy/Patch    Admission Diagnosis  Hypoxia [R09.02] Respiratory failure with hypoxia (HCC) [J96.91] Acute respiratory failure with hypoxia (HCC) [J96.01] Chest pain, unspecified type [R07.9] Community acquired pneumonia, unspecified laterality [J18.9]   Discharge Diagnosis  Hypoxia [R09.02] Respiratory failure with hypoxia (HCCHoisingtonJ96.91] Acute respiratory failure with hypoxia (HCC) [J96.01] Chest pain, unspecified type [R07.9] Community acquired pneumonia, unspecified laterality [J18.9]    Principal Problem:   Acute respiratory failure with hypoxia (HCCNechective Problems:    Class 2 obesity   Prehypertension   Hypokalemia   Hyperlipidemia   Anxiety with depression   Respiratory failure with hypoxia (HCC[place]    Past Medical History:  Diagnosis Date  . Anxiety   . Cervical cancer ([place]/2012   Stage 1B squamous cell  . Chronic left shoulder pain 10/07/2017  . Chronic tension-type headache, not intractable 10/07/2017  . Class 2 obesity 09/29/2019  . Depression   . High cholesterol   . History of kidney stones    passed  . Prehypertension 06/26/2020  . S/P lumbar fusion 04/20/2018    Past Surgical History:  Procedure Laterality Date  . ABDOMINAL HYSTERECTOMY    . BREAST BIOPSY Right   . CERVICAL CONIZATION W/BX  03/24/2012   Procedure: CONIZATION CERVIX WITH BIOPSY;  Surgeon: JamWoodroe ModeD;  [facility];  Service: Gynecology;  Laterality: N/A;  . DIAGNOSTIC LAPAROSCOPY  2007   ectopic  . ECTOPIC PREGNANCY SURGERY    . ESSURE TUBAL LIGATION  2010  . INSERTION OF SUPRAPUBIC CATHETER  05/03/2012   Procedure: INSERTION OF SUPRAPUBIC CATHETER;  Surgeon: DanAlvino ChapelD;  Location: [facility];  Service: Gynecology;;  . LEEP    . LUMBAR LAMINECTOMY/DECOMPRESSION MICRODISCECTOMY N/A 04/20/2018   Procedure: LUMBAR FIVE TO SACRAL ONE DECOMPRESSION AND FUSION, POSSIBLE LUMBAR FOUR TO SACRAL ONE; SCREW AND CAGE  INSTRUMENTATION;  Surgeon: Melina Schools, MD;  Location: [facility];  Service: Orthopedics;  Laterality: N/A;  5 hrs  . LYMPHADENECTOMY  05/03/2012   Procedure: LYMPHADENECTOMY;  Surgeon: Alvino Chapel, MD;  Location: [facility];  Service: Gynecology;  Laterality: Bilateral;  pelvic  . RADICAL HYSTERECTOMY  05/03/2012   Procedure: RADICAL HYSTERECTOMY;  Surgeon: Alvino Chapel, MD;  Location: [facility];  Service: Gynecology;;  transposition of the ovaries  . SPINE SURGERY    . TUBAL LIGATION       HPI  from the history and physical done on the day of admission:   Chief Complaint: Shortness of breath and chest pain.  HPI:  April Ayala is a 44 y.o. female with medical history significant of anxiety, depression, class II obesity, chronic left shoulder pain, chronic tension type headaches, cervical cancer, hyperlipidemia, cholelithiasis, prehypertension, history of lumbar spine fusion, chronic lower back pain who was recently discharged from the hospital about 3 to 4 weeks ago and is coming to the emergency department with persistent precordial/frontal chest wall pleuritic pain which has exacerbated a lot more since yesterday morning.  The pain is made worse by coughing or deep inspiration.  She denies hemoptysis.  She has also occasional wheezing.  She denies fever, chills, sore throat, rhinorrhea, palpitations, dizziness, diaphoresis, PND, orthopnea or recent pitting edema of the lower extremities.  No abdominal pain, nausea, vomiting, diarrhea, constipation, melena or hematochezia.  No dysuria, frequency hematuria.  No polyuria, polydipsia, polyphagia or blurred vision.  ED Course: Initial vital signs were temperature 98.8 F, pulse 121, respiration 18, blood pressure 139/116 mmHg O2 sat 91% on room air.  The patient received Toradol 30 mg IVP, ceftriaxone 1 g IV and azithromycin 500 mg IV.  Labwork: CBC was normal.  D-dimer was 0.97 mcg/mL.  Procalcitonin was 0.13 ng/mL.  Troponin was 4 and then 5 ng/mL.  BNP was 120.0 pg/mL.  Potassium is 3.4 mmol/L.  All other electrolytes are within expected range when calcium is corrected to albumin.  Renal function was normal.  LFTs show an albumin of 3.3 g/dL and AST of 52 units/L, the rest of the hepatic function tests are within normal limits.  Imaging: Portable chest radiograph showed low lung volumes without acute or active cardiopulmonary disease.  CTA chest showed marked severity diffuse bilateral infiltrates and cholelithiasis, but no PE.  Please see images imported report for further detail.      Hospital Course:     Summary:- 43 y.o.femalewith medical  history significant ofanxiety, depression, class II obesity, chronic left shoulder pain, chronic tension type headaches, cervical cancer, hyperlipidemia, cholelithiasis, prehypertension, history of lumbar spine fusion, chronic lower back painwho was readmitted on 12/26/2021with acute hypoxic respiratory failure secondary to pulmonary infection/inflammation CTA chest on 06/30/20---with Marked severity diffuse bilateral infiltrates. CTA  Chest from 05/27/20 noted--  A/p 1)Acute hypoxic respiratory failure--- ??Reactive airway Versus pulmonary inflammation Versus recurrent pneumonia -Readmitted on 12/26/2021with acute hypoxic respiratory failure secondary to pulmonary infection/inflammation CTA chest on 06/30/20---withMarked Severity Diffuse Bilateral infiltrates. CTA Chest from 05/27/20 noted-- CRP 21.3, ESR 75 WBC 16.7 due to steroids PCT <0.1 , Afebrile Favor non-infectious etiology Improved with iv Steroids --Discussed with pulmonologist Dr. [name]---??? BOOP =-Okay to stop Rocephin and azithromycin,  -Continue-Bronchodilators and mucolytics as ordered -Supplemental oxygen -BNP is only 120 -Troponin is Not elevated -COVID-19 and influenza negative -D-dimer 0.97,butCTA chest without acute PE,CTA chest does showMarked severity diffuse bilateral infiltrates. -Discharge home on prednisone taper -UDS negative -General antigen pending -Outpatient  follow-up with pulmonologist advised  2)Class 2 obesity -Low calorie diet, portion control and increase physical activity discussed with patient -Body mass index is 38.62 kg/m.  3)Prehypertension Lifestyle modifications- Follow-up with PCP.  4)Cholelithiasis--incidental finding, asymptomatic  5)Hyperlipidemia Continue Crestor 5 mg p.o. daily.  6)Anxiety with depression Continue amitriptyline 75 mg p.o. at bedtime. Continue BuSpar 5 mg p.o. twice daily.  7)Tobacco Abuse--Smoking cessation counseling offered,   consider Nicotine replacement therapy  8)Acute Hypoxic Respiratory Failure--- secondary to #1 above, requiring oxygen at 2 L by nasal cannula with activity  Discharge Condition: stable  Follow UP   Follow-up Information    [facility] Follow up.   Why:   [facility]  Contact information: [address] [place] [address] [phone]        Tanda Rockers, MD. Schedule an appointment as soon as possible for a visit in 2 week(s).   Specialty: Pulmonary Disease Contact information: [place] [phone]               Consults obtained - Pulm   Diet and Activity recommendation:  As advised  Discharge Instructions   Discharge Instructions    Call MD for:  difficulty breathing, headache or visual disturbances   Complete by: As directed    Call MD for:  persistant dizziness or light-headedness   Complete by: As directed    Call MD for:  persistant nausea and vomiting   Complete by: As directed    Call MD for:  severe uncontrolled pain   Complete by: As directed    Call MD for:  temperature >100.4   Complete by: As directed    Diet - low sodium heart healthy   Complete by: As directed    Discharge instructions   Complete by: As directed    1)Prednisone Taper---Take Prednisone 4 tablets (52m) daily for 4 days, then 3 tab (380m daily for 4 days, then 2 tab (2014mdaily x 3 days, then 10 mg (1 tablet) daily for 3 days then STOP --Always take with Food  2)You need oxygen at home at 2 L via nasal cannula continuously while awake and while asleep--- smoking or having open fires around oxygen can cause fire, significant injury and death  3)Follow - up with Dr. MicChristinia Gully- [place]Address: [address] [phone]: [phone] call [phone] needs to be seen in the [place], [place])Avoid ibuprofen/Advil/Aleve/Motrin/Goody  Powders/Naproxen/BC powders/Meloxicam/Diclofenac/Indomethacin and other Nonsteroidal anti-inflammatory medications as these will make you more likely to bleed and can cause stomach ulcers, can also cause Kidney problems.   5)Smoking cessation counseling offered- Use Nicotine replacement therapy/Patch   Increase activity slowly   Complete by: As directed       Discharge Medications     Allergies as of 07/01/2020   No Known Allergies     Medication List    STOP taking these medications   naproxen 500 MG tablet Commonly known as: NAPROSYN   phentermine 37.5 MG tablet Commonly known as: ADIPEX-P     TAKE these medications   acetaminophen 325 MG tablet Commonly known as: TYLENOL Take 2 tablets (650 mg total) by mouth every 6 (six) hours as needed for mild pain (or Fever >/= 101).   albuterol 108 (90 Base) MCG/ACT inhaler Commonly known as: VENTOLIN HFA Inhale 2 puffs into the lungs every 4 (four) hours as needed for wheezing  or shortness of breath. What changed:   how much to take  when to take this   amitriptyline 75 MG tablet Commonly known as: ELAVIL Take 1 tablet (75 mg total) by mouth at bedtime.   benzonatate 100 MG capsule Commonly known as: TESSALON Take 1 capsule (100 mg total) by mouth 3 (three) times daily as needed for cough.   busPIRone 5 MG tablet Commonly known as: BUSPAR Take 1 tablet (5 mg total) by mouth 2 (two) times daily.   cyclobenzaprine 10 MG tablet Commonly known as: FLEXERIL Take 10 mg by mouth 3 (three) times daily.   Emgality 120 MG/ML Soaj Generic drug: Galcanezumab-gnlm Inject 120 mg into the skin every 30 (thirty) days.   fluticasone 50 MCG/ACT nasal spray Commonly known as: FLONASE Place 2 sprays into both nostrils daily.   fluticasone furoate-vilanterol 200-25 MCG/INH Aepb Commonly known as: BREO ELLIPTA Inhale 1 puff into the lungs daily.   oxyCODONE 10 mg 12 hr tablet Commonly known as: OXYCONTIN Take 10 mg by mouth  every 12 (twelve) hours.   oxyCODONE-acetaminophen 10-325 MG tablet Commonly known as: PERCOCET Take 1 tablet by mouth 4 (four) times daily as needed.   predniSONE 10 MG tablet Commonly known as: DELTASONE Take 1 tablet (10 mg total) by mouth See admin instructions. Take 4 tablets (65m) daily for 4 days, then 3 tab (338m daily for 4 days, then 2 tab (2041mdaily x 3 days, then 10 mg (1 tablet) daily for 3 days then STOP --Always take with Food   pregabalin 150 MG capsule Commonly known as: LYRICA Take 150 mg by mouth 2 (two) times daily.   rizatriptan 10 MG disintegrating tablet Commonly known as: MAXALT-MLT Take 1 tablet (10 mg total) by mouth as needed for migraine. May repeat in 2 hours if needed   rizatriptan 10 MG tablet Commonly known as: MAXALT Take 10 mg by mouth 2 (two) times daily as needed.   rosuvastatin 5 MG tablet Commonly known as: CRESTOR Take 1 tablet (5 mg total) by mouth daily.   Vitamin D (Ergocalciferol) 1.25 MG (50000 UNIT) Caps capsule Commonly known as: DRISDOL Take 1 capsule (50,000 Units total) by mouth every 7 (seven) days.   Vitamin D3 1.25 MG (50000 UT) Tabs Take 1 tablet by mouth daily.   Xtampza ER 9 MG C12a Generic drug: oxyCODONE ER Xtampza ER 9 mg capsule sprinkle  Take 1 capsule every 12 hours by oral route.            Durable Medical Equipment  (From admission, onward)         Start     Ordered   07/01/20 1419  For home use only DME oxygen  Once       Comments: SATURATION QUALIFICATIONS: (Thisnote is usedto comply with regulatory documentation for home oxygen)  Patient Saturations on Room Air at Rest =92 %  Patient Saturations on Room Air while Ambulating =84%  Patient Saturations on2Liters of oxygen while Ambulating = 92%     Patient needs continuous O2 at 2 L/min continuously via nasal cannula with humidifier, with gaseous portability and conserving device    Diagnosis---BOOP  Question Answer Comment   Length of Need Lifetime   Mode or (Route) Nasal cannula   Liters per Minute 2   Frequency Continuous (stationary and portable oxygen unit needed)   Oxygen conserving device Yes   Oxygen delivery system Gas      07/01/20 1419  Major procedures and Radiology Reports - PLEASE review detailed and final reports for all details, in brief -   CT Angio Chest PE W/Cm &/Or Wo Cm  Result Date: 06/30/2020 CLINICAL DATA:  Worsening shortness of breath. EXAM: CT ANGIOGRAPHY CHEST WITH CONTRAST TECHNIQUE: Multidetector CT imaging of the chest was performed using the standard protocol during bolus administration of intravenous contrast. Multiplanar CT image reconstructions and MIPs were obtained to evaluate the vascular anatomy. CONTRAST:  170m OMNIPAQUE IOHEXOL 350 MG/ML SOLN COMPARISON:  May 27, 2020 FINDINGS: Cardiovascular: Satisfactory opacification of the pulmonary arteries to the segmental level. No evidence of pulmonary embolism. Normal heart size. No pericardial effusion. Mediastinum/Nodes: There is mild pretracheal and AP window lymphadenopathy. Thyroid gland, trachea, and esophagus demonstrate no significant findings. Lungs/Pleura: Marked severity diffuse, predominantly ground-glass appearing infiltrates are seen throughout both lungs. There is no evidence of a pleural effusion or pneumothorax. Upper Abdomen: Ill-defined gallstones are seen within the gallbladder lumen. Musculoskeletal: No chest wall abnormality. No acute or significant osseous findings. Review of the MIP images confirms the above findings. IMPRESSION: 1. Marked severity diffuse bilateral infiltrates. 2. Cholelithiasis. Electronically Signed   By: TVirgina NorfolkM.D.   On: 06/30/2020 02:26   DG Chest Portable 1 View  Result Date: 06/29/2020 CLINICAL DATA:  Shortness of breath and fatigue. EXAM: PORTABLE CHEST 1 VIEW COMPARISON:  May 27, 2020 FINDINGS: Decreased lung volumes are seen which is likely secondary  to the degree of patient inspiration. Mild pulmonary vascular crowding is seen without evidence of focal consolidation, pleural effusion or pneumothorax. The heart size and mediastinal contours are within normal limits. The visualized skeletal structures are unremarkable. IMPRESSION: Low lung volumes without acute or active cardiopulmonary disease. Electronically Signed   By: TVirgina NorfolkM.D.   On: 06/29/2020 20:22   Micro Results   Recent Results (from the past 240 hour(s))  Resp Panel by RT-PCR (Flu A&B, Covid) Nasopharyngeal Swab     Status: None   Collection Time: 06/29/20 10:25 PM   Specimen: Nasopharyngeal Swab; Nasopharyngeal(NP) swabs in vial transport medium  Result Value Ref Range Status   SARS Coronavirus 2 by RT PCR NEGATIVE NEGATIVE Final    Comment: (NOTE) SARS-CoV-2 target nucleic acids are NOT DETECTED.  The SARS-CoV-2 RNA is generally detectable in upper respiratory specimens during the acute phase of infection. The lowest concentration of SARS-CoV-2 viral copies this assay can detect is 138 copies/mL. A negative result does not preclude SARS-Cov-2 infection and should not be used as the sole basis for treatment or other patient management decisions. A negative result may occur with  improper specimen collection/handling, submission of specimen other than nasopharyngeal swab, presence of viral mutation(s) within the areas targeted by this assay, and inadequate number of viral copies(<138 copies/mL). A negative result must be combined with clinical observations, patient history, and epidemiological information. The expected result is Negative.  Fact Sheet for Patients:  hEntrepreneurPulse.com.au Fact Sheet for Healthcare Providers:  hIncredibleEmployment.be This test is no t yet approved or cleared by the [place] and  has been authorized for detection and/or diagnosis of SARS-CoV-2 by FDA under an Emergency Use  Authorization (EUA). This EUA will remain  in effect (meaning this test can be used) for the duration of the COVID-19 declaration under Section 564(b)(1) of the Act, 21 U.S.C.section 360bbb-3(b)(1), unless the authorization is terminated  or revoked sooner.       Influenza A by PCR NEGATIVE NEGATIVE Final   Influenza B by PCR  NEGATIVE NEGATIVE Final    Comment: (NOTE) The Xpert Xpress SARS-CoV-2/FLU/RSV plus assay is intended as an aid in the diagnosis of influenza from Nasopharyngeal swab specimens and should not be used as a sole basis for treatment. Nasal washings and aspirates are unacceptable for Xpert Xpress SARS-CoV-2/FLU/RSV testing.  Fact Sheet for Patients: EntrepreneurPulse.com.au  Fact Sheet for Healthcare Providers: IncredibleEmployment.be  This test is not yet approved or cleared by the [place] FDA and has been authorized for detection and/or diagnosis of SARS-CoV-2 by FDA under an Emergency Use Authorization (EUA). This EUA will remain in effect (meaning this test can be used) for the duration of the COVID-19 declaration under Section 564(b)(1) of the Act, 21 U.S.C. section 360bbb-3(b)(1), unless the authorization is terminated or revoked.  Performed at [facility], [address] [place], [place] [address]    Today   Subjective    April Ayala today has no new concerns  No fever  Or chills   No Nausea, Vomiting or Diarrhea        Patient has been seen and examined prior to discharge   Objective   Blood pressure (!) 100/54, pulse 96, temperature 98.9 F (37.2 C), temperature source Oral, resp. rate 18, height '5\' 3"'  (1.6 m), weight 100 kg, last menstrual period 04/11/2012, SpO2 99 %.  Intake/Output Summary (Last 24 hours) at 07/01/2020 1715 Last data filed at 07/01/2020 1510 Gross per 24 hour  Intake 1550 ml  Output --  Net 1550 ml   Exam Gen:- Awake Alert, no acute distress  HEENT:- [place].AT, No  sclera icterus Nose- [place] 2L/min--  Neck-Supple Neck,No JVD,.  Lungs-improved air movement, no wheezing CV- S1, S2 normal, regular Abd-  +ve B.Sounds, Abd Soft, No tenderness,    Extremity/Skin:- No  edema,   good pulses Psych-affect is appropriate, oriented x3 Neuro-no new focal deficits, no tremors    Data Review   CBC w Diff:  Lab Results  Component Value Date   WBC 16.7 (H) 07/01/2020   HGB 12.1 07/01/2020   HCT 37.3 07/01/2020   PLT 321 07/01/2020   LYMPHOPCT 21 06/29/2020   MONOPCT 5 06/29/2020   EOSPCT 3 06/29/2020   BASOPCT 1 06/29/2020    CMP:  Lab Results  Component Value Date   NA 138 07/01/2020   K 4.6 07/01/2020   CL 107 07/01/2020   CO2 21 (L) 07/01/2020   BUN 14 07/01/2020   CREATININE 0.64 07/01/2020   CREATININE 0.97 04/24/2020   PROT 6.4 (L) 07/01/2020   ALBUMIN 2.9 (L) 07/01/2020   BILITOT 0.4 07/01/2020   ALKPHOS 99 07/01/2020   AST 32 07/01/2020   ALT 23 07/01/2020   Total Discharge time is about 33 minutes  Roxan Hockey M.D on 07/01/2020 at 5:15 PM  Go to [email or web address] -  for contact info  [facility] - Office  [phone]

## 2020-07-01 NOTE — Progress Notes (Signed)
  Readmitted on 12/26/2021with acute hypoxic respiratory failure secondary to pulmonary infection/inflammation CTA chest on 06/30/20---with Marked severity diffuse bilateral infiltrates. CTA  Chest from 05/27/20 noted--  CRP 21.3, ESR 75 WBC 16.7 due to steroids PCT <0.1 , Afebrile  Favor non-infectious etiology  C/n iv Steroids   Roxan Hockey, MD

## 2020-07-01 NOTE — TOC Transition Note (Signed)
Transition of Care [facility]) - CM/SW Discharge Note   Patient Details  Name: April Ayala MRN: [id number] Date of Birth: [birth year]  [facility] CM/SW Contact:  Leitha Bleak, RN Phone Number: 07/01/2020, 3:36 PM   Clinical Narrative:   [facility] consults for home oxygen.     Final next level of care: Home/Self Care Barriers to Discharge: Barriers Resolved   Patient Goals and CMS Choice Patient states their goals for this hospitalization and ongoing recovery are:: to go home. Due to holidays, TOC contacted [facility] agent, They will have a driver out here in the next hour to hour and a half. RN updated.       Discharge Placement           Patient and family notified of of transfer: 07/01/20  Discharge Plan and Services                DME Arranged: Oxygen DME Agency: [facility] Date DME Agency Contacted: 07/01/20 Time DME Agency Contacted: 1435 Representative spoke with at DME Agency: Cassie     Readmission Risk Interventions Readmission Risk Prevention Plan 07/01/2020  Post Dischage Appt Not Complete  Medication Screening Complete  Transportation Screening Complete  Some recent data might be hidden

## 2020-07-01 NOTE — Consult Note (Signed)
NAME:  April Ayala, MRN:  [id number], DOB:  [birth year], LOS: 1 ADMISSION DATE:  06/29/2020, CONSULTATION DATE:  07/01/20  REFERRING MD:  Denton Brick , CHIEF COMPLAINT:  Sobcp   Brief History:  44 yowf s/p 2nd pfizer 03/05/20 quit smoking 2019 with prior dx of AB but not further resp symptoms until around 05/25/20 severe cough/ L ant cp and fever > CTs 11/22 with Diffuse, but upper lobe predominant, nodular-like patchy peribronchovascular opacities rx: zmax/ amox and better unttil 12/24 with new midline pleuritic cp/ cough s fever with cta  New diffuse GG changes and hypoxemia with neg covid > admit and rx for CAP but neg PCT and started on solumedrol am 12/26 and PPCM consulted am 12/27  History of Present Illness:  Chief Complaint: Shortness of breath and chest pain.  HPI:  44 y.o. female with medical history significant of anxiety, depression, class II obesity, chronic left shoulder pain, chronic tension type headaches, cervical cancer, hyperlipidemia, cholelithiasis, prehypertension, history of lumbar spine fusion, chronic lower back pain who was discharged from the hospital about 3 to 4 weeks ago and is coming to the emergency department with persistent precordial/frontal chest wall pleuritic pain which  exacerbated  X 24 h PTA.  The pain is made worse by coughing or deep inspiration.  She denies hemoptysis.  She has also occasional wheezing.   ED Course: Initial vital signs were temperature 98.8 F, pulse 121, respiration 18, blood pressure 139/116 mmHg O2 sat 91% on room air.  The patient received Toradol 30 mg IVP, ceftriaxone 1 g IV and azithromycin 500 mg IV.  Labwork: CBC was normal.  D-dimer was 0.97 mcg/mL.  Procalcitonin was 0.13 ng/mL.  Troponin was 4 and then 5 ng/mL.  BNP was 120.0 pg/mL.  Potassium is 3.4 mmol/L.  All other electrolytes are within expected range when calcium is corrected to albumin.  Renal function was normal.  LFTs show an albumin of 3.3 g/dL and AST of 52  units/L, the rest of the hepatic function tests are within normal limits.  Imaging: Portable chest radiograph showed low lung volumes without acute or active cardiopulmonary disease.  CTA chest showed marked severity diffuse bilateral infiltrates and cholelithiasis, but no PE.  Past Medical History:    Anxiety  Cervical cancer ([place]) (04/2011)  Chronic left shoulder pain (10/07/2017)  Chronic tension-type headache, not intractable (10/07/2017  Class 2 obesity (09/29/2019)  Depression  High cholesterol History of kidney stones Prehypertension (06/26/2020)  S/P lumbar fusion (04/20/2018).   Significant Hospital Events:  Solumedrol started am 12/26   Consults:  PCCM 12/27  Procedures:    Significant Diagnostic Tests:  CTa  12/26  1. Marked severity diffuse bilateral infiltrates. 2. Cholelithiasis.  ESR am 12/27   75    Micro Data:  Resp viral panel PCR  Neg flu/ neg covid 19   Antimicrobials:  Roc 12/25 > 12/27 Zmax 12/25 > 12/27   Scheduled Meds: . amitriptyline  75 mg Oral QHS  . busPIRone  5 mg Oral BID  . cholecalciferol  1,000 Units Oral Daily  . cyclobenzaprine  10 mg Oral TID  . enoxaparin (LOVENOX) injection  40 mg Subcutaneous Q24H  . fluticasone  2 spray Each Nare Daily  . methylPREDNISolone (SOLU-MEDROL) injection  40 mg Intravenous Q8H  . oxyCODONE  10 mg Oral Q12H  . pregabalin  150 mg Oral BID  . rosuvastatin  5 mg Oral Daily   Continuous Infusions: . azithromycin 500 mg (07/01/20 0837)  .  cefTRIAXone (ROCEPHIN)  IV 1 g (07/01/20 0558)   PRN Meds:.acetaminophen **OR** acetaminophen, albuterol, benzonatate, ipratropium, ondansetron **OR** ondansetron (ZOFRAN) IV, oxyCODONE-acetaminophen **AND** oxyCODONE, SUMAtriptan    Interim History / Subjective:  Much better, no longer wearing 0 consistently, cough is non productive  Objective   Blood pressure 109/70, pulse 79, temperature 98.1 F (36.7 C), resp. rate 20, height '5\' 3"'  (1.6 m), weight 100 kg,  last menstrual period 04/11/2012, SpO2 100 %.        Intake/Output Summary (Last 24 hours) at 07/01/2020 1305 Last data filed at 07/01/2020 0900 Gross per 24 hour  Intake 2610.98 ml  Output --  Net 2610.98 ml   Filed Weights   06/29/20 1953 06/30/20 1732  Weight: 98.9 kg 100 kg    Examination: Tmax 98.8  Mod obese wf nad 3lpm sats 100%  General: not acutely ill appearing  HENT: nl Lungs: min insp crackles bases  Cardiovascular: RRR no s3 Abdomen: obese/ benign Extremities: warm s CCE Neuro: intact      I personally reviewed images and agree with radiology impression as follows:   Chest CT 06/30/20 1. Marked severity diffuse GG bilateral infiltrates not seens on CTa 05/27/20  2. Cholelithiasis.  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 Drug side effects - did not do drug screen but her cxr is suggestive of cocaine effects so need to advise her  Pulmonary emboli, Protein disorders Edema  BNP nl  Eosinophilic dz  eos 0.3 Sarcoidosis Connective tissue dz- high esr but nothing else to suggest Hist X Hemorrhage - no hemoptysis but hgb has dropped a bit and hard to r/o    Lab Results  Component Value Date   HGB 12.1 07/01/2020   HGB 13.1 06/29/2020   HGB 14.2 05/27/2020  >>>  consier antiBM antibodies/ ANCA profile but her response to steroids is so convincing I  Favor post - infection boop here Idiopathic    >>> f/u in office in 2 weeks on   Pantoprazole (protonix) 40 mg   Take  30-60 min before first meal of the day and Pepcid (famotidine)  20 mg one @  bedtime until return to office   Jerrye Bushy  diet  Prednisone 40  X 3,  30 x 3 d, 20 x 3 days, 10 x 3 days and off.  (30 pills)   I will f/u as outpt in 2 weeks     Christinia Gully, MD [facility] [place] [phone]   After 7:00 pm call Elink  [phone]          Labs   CBC: Recent Labs  Lab 06/29/20 2225 07/01/20 0436  WBC 10.4 16.7*  NEUTROABS 7.3  --   HGB 13.1 12.1  HCT 39.8 37.3  MCV 93.0 93.5  PLT 262 683    Basic Metabolic Panel: Recent Labs  Lab 06/29/20 2225 07/01/20 0436  NA 137 138  K 3.4* 4.6  CL 101 107  CO2 26 21*  GLUCOSE 106* 124*  BUN 12 14  CREATININE 0.89 0.64  CALCIUM 8.6* 8.7*  MG 1.9  --  PHOS 3.5  --    GFR: Estimated Creatinine Clearance: 103.3 mL/min (by C-G formula based on SCr of 0.64 mg/dL). Recent Labs  Lab 06/29/20 2225 07/01/20 0436  PROCALCITON 0.13 <0.10  WBC 10.4 16.7*    Liver Function Tests: Recent Labs  Lab 06/29/20 2225 07/01/20 0436  AST 52* 32  ALT 30 23  ALKPHOS 114 99  BILITOT 0.7 0.4  PROT 6.7 6.4*  ALBUMIN 3.3* 2.9*   No results for input(s): LIPASE, AMYLASE in the last 168 hours. No results for input(s): AMMONIA in the last 168 hours.  ABG No results found for: PHART, PCO2ART, PO2ART, HCO3, TCO2, ACIDBASEDEF, O2SAT   Coagulation Profile: No results for input(s): INR, PROTIME in the last 168 hours.  Cardiac Enzymes: No results for input(s): CKTOTAL, CKMB, CKMBINDEX, TROPONINI in the last 168 hours.  HbA1C: Hgb A1c MFr Bld  Date/Time Value Ref Range Status  09/29/2019 11:05 AM 5.1 <5.7 % of total Hgb Final    Comment:    For the purpose of screening for the presence of diabetes: . <5.7%       Consistent with the absence of diabetes 5.7-6.4%    Consistent with increased risk for diabetes             (prediabetes) > or =6.5%  Consistent with diabetes . This assay result is consistent with a decreased risk of diabetes. . Currently, no consensus exists regarding use of hemoglobin  A1c for diagnosis of diabetes in children. . According to American Diabetes Association (ADA) guidelines, hemoglobin A1c <7.0% represents optimal control in non-pregnant diabetic patients. Different metrics may apply to specific patient populations.  Standards of Medical Care in Diabetes(ADA). Marland Kitchen   07/20/2017 03:42 PM 5.1 <5.7 % of total Hgb Final    Comment:    For the purpose of screening for the presence of diabetes: . <5.7%       Consistent with the absence of diabetes 5.7-6.4%    Consistent with increased risk for diabetes             (prediabetes) > or =6.5%  Consistent with diabetes . This assay result is consistent with a decreased risk of diabetes. . Currently, no consensus exists regarding use of hemoglobin A1c for diagnosis of diabetes in children. . According to American Diabetes Association (ADA) guidelines, hemoglobin A1c <7.0% represents optimal control in non-pregnant diabetic patients. Different metrics may apply to specific patient populations.  Standards of Medical Care in Diabetes(ADA). .     CBG: No results for input(s): GLUCAP in the last 168 hours.     Past Medical History:  She,  has a past medical history of Anxiety, Cervical cancer ([place]) (04/2011), Chronic left shoulder pain (10/07/2017), Chronic tension-type headache, not intractable (10/07/2017), Class 2 obesity (09/29/2019), Depression, High cholesterol, History of kidney stones, Prehypertension (06/26/2020), and S/P lumbar fusion (04/20/2018).   Surgical History:   Past Surgical History:  Procedure Laterality Date  . ABDOMINAL HYSTERECTOMY    . BREAST BIOPSY Right   . CERVICAL CONIZATION W/BX  03/24/2012   Procedure: CONIZATION CERVIX WITH BIOPSY;  Surgeon: Woodroe Mode, MD;  Location: [facility];  Service: Gynecology;  Laterality: N/A;  . DIAGNOSTIC LAPAROSCOPY  2007   ectopic  . ECTOPIC PREGNANCY SURGERY    . ESSURE TUBAL LIGATION  2010  . INSERTION OF SUPRAPUBIC CATHETER  05/03/2012    Procedure: INSERTION OF SUPRAPUBIC CATHETER;  Surgeon: Alvino Chapel, MD;  Location: [facility];  Service: Gynecology;;  . LEEP    .  LUMBAR LAMINECTOMY/DECOMPRESSION MICRODISCECTOMY N/A 04/20/2018   Procedure: LUMBAR FIVE TO SACRAL ONE DECOMPRESSION AND FUSION, POSSIBLE LUMBAR FOUR TO SACRAL ONE; SCREW AND CAGE INSTRUMENTATION;  Surgeon: Melina Schools, MD;  Location: [facility];  Service: Orthopedics;  Laterality: N/A;  5 hrs  . LYMPHADENECTOMY  05/03/2012   Procedure: LYMPHADENECTOMY;  Surgeon: Alvino Chapel, MD;  Location: [facility];  Service: Gynecology;  Laterality: Bilateral;  pelvic  . RADICAL HYSTERECTOMY  05/03/2012   Procedure: RADICAL HYSTERECTOMY;  Surgeon: Alvino Chapel, MD;  Location: [facility];  Service: Gynecology;;  transposition of the ovaries  . SPINE SURGERY    . TUBAL LIGATION       Social History:   reports that she has quit smoking. Her smoking use included cigarettes. She started smoking about 19 months ago. She has a 0.50 pack-year smoking history. She has never used smokeless tobacco. She reports that she does not drink alcohol and does not use drugs.   Family History:  Her family history includes Diabetes in her mother and sister; Hypertension in her father; Hypothyroidism in her sister; Migraines in her sister, sister, and sister.   Allergies No Known Allergies   Home Medications  Prior to Admission medications   Medication Sig Start Date End Date Taking? Authorizing Provider  albuterol (VENTOLIN HFA) 108 (90 Base) MCG/ACT inhaler Inhale 1-2 puffs into the lungs every 6 (six) hours as needed for wheezing or shortness of breath. 05/27/20   Joy, Shawn C, PA-C  amitriptyline (ELAVIL) 75 MG tablet Take 1 tablet (75 mg total) by mouth at bedtime. 05/13/20   Perlie Mayo, NP  benzonatate (TESSALON) 100 MG capsule Take 1 capsule (100 mg total) by mouth 3 (three) times daily as needed for cough. 06/29/20   Lisette Abu, PA-C  busPIRone (BUSPAR) 5  MG tablet Take 1 tablet (5 mg total) by mouth 2 (two) times daily. 05/13/20   Fayrene Helper, MD  Cholecalciferol (VITAMIN D3) 1.25 MG (50000 UT) TABS Take 1 tablet by mouth daily.    [provider]  cyclobenzaprine (FLEXERIL) 10 MG tablet Take 10 mg by mouth 3 (three) times daily.     [provider]  fluticasone (FLONASE) 50 MCG/ACT nasal spray Place 2 sprays into both nostrils daily. 06/29/20   Lisette Abu, PA-C  Galcanezumab-gnlm (EMGALITY) 120 MG/ML SOAJ Inject 120 mg into the skin every 30 (thirty) days. 02/15/20   Melvenia Beam, MD  naproxen (NAPROSYN) 500 MG tablet Take 1 tablet (500 mg total) by mouth 2 (two) times daily with a meal. 03/06/20   Sanjuana Kava, MD  oxyCODONE (OXYCONTIN) 10 mg 12 hr tablet Take 10 mg by mouth every 12 (twelve) hours.     [provider]  oxyCODONE ER (XTAMPZA ER) 9 MG C12A Xtampza ER 9 mg capsule sprinkle  Take 1 capsule every 12 hours by oral route.    [provider]  oxyCODONE-acetaminophen (PERCOCET) 10-325 MG tablet Take 1 tablet by mouth 4 (four) times daily as needed.     [provider]  phentermine (ADIPEX-P) 37.5 MG tablet Take 1 tablet (37.5 mg total) by mouth daily before breakfast. 06/26/20   Perlie Mayo, NP  pregabalin (LYRICA) 150 MG capsule Take 150 mg by mouth 2 (two) times daily. 05/20/20   [provider]  rizatriptan (MAXALT) 10 MG tablet Take 10 mg by mouth 2 (two) times daily as needed. 03/31/20   [provider]  rizatriptan (MAXALT-MLT) 10 MG disintegrating tablet Take  1 tablet (10 mg total) by mouth as needed for migraine. May repeat in 2 hours if needed 01/04/20   Melvenia Beam, MD  rosuvastatin (CRESTOR) 5 MG tablet Take 1 tablet (5 mg total) by mouth daily. 05/24/20   Perlie Mayo, NP  Vitamin D, Ergocalciferol, (DRISDOL) 1.25 MG (50000 UNIT) CAPS capsule Take 1 capsule (50,000 Units total) by mouth every 7 (seven) days. 10/10/19   Perlie Mayo, NP

## 2020-07-01 NOTE — Progress Notes (Signed)
Nsg Discharge Note  Admit Date:  06/29/2020 Discharge date: 07/01/2020   April Ayala to be D/C'd Home per MD order.  AVS completed.  Copy for chart, and copy for patient signed, and dated. Patient/caregiver able to verbalize understanding. IV removed. Discharge paper work given and reviewed with patient. Patient had no questions. Patient discharged home with oxygen tanks for home oxygen use.   Discharge Medication: Allergies as of 07/01/2020   No Known Allergies     Medication List    STOP taking these medications   naproxen 500 MG tablet Commonly known as: NAPROSYN   phentermine 37.5 MG tablet Commonly known as: ADIPEX-P     TAKE these medications   acetaminophen 325 MG tablet Commonly known as: TYLENOL Take 2 tablets (650 mg total) by mouth every 6 (six) hours as needed for mild pain (or Fever >/= 101).   albuterol 108 (90 Base) MCG/ACT inhaler Commonly known as: VENTOLIN HFA Inhale 2 puffs into the lungs every 4 (four) hours as needed for wheezing or shortness of breath. What changed:   how much to take  when to take this   amitriptyline 75 MG tablet Commonly known as: ELAVIL Take 1 tablet (75 mg total) by mouth at bedtime.   benzonatate 100 MG capsule Commonly known as: TESSALON Take 1 capsule (100 mg total) by mouth 3 (three) times daily as needed for cough.   busPIRone 5 MG tablet Commonly known as: BUSPAR Take 1 tablet (5 mg total) by mouth 2 (two) times daily.   cyclobenzaprine 10 MG tablet Commonly known as: FLEXERIL Take 10 mg by mouth 3 (three) times daily.   Emgality 120 MG/ML Soaj Generic drug: Galcanezumab-gnlm Inject 120 mg into the skin every 30 (thirty) days.   fluticasone 50 MCG/ACT nasal spray Commonly known as: FLONASE Place 2 sprays into both nostrils daily.   fluticasone furoate-vilanterol 200-25 MCG/INH Aepb Commonly known as: BREO ELLIPTA Inhale 1 puff into the lungs daily.   oxyCODONE 10 mg 12 hr tablet Commonly known  as: OXYCONTIN Take 10 mg by mouth every 12 (twelve) hours.   oxyCODONE-acetaminophen 10-325 MG tablet Commonly known as: PERCOCET Take 1 tablet by mouth 4 (four) times daily as needed.   predniSONE 10 MG tablet Commonly known as: DELTASONE Take 1 tablet (10 mg total) by mouth See admin instructions. Take 4 tablets (40mg ) daily for 4 days, then 3 tab (30mg ) daily for 4 days, then 2 tab (20mg ) daily x 3 days, then 10 mg (1 tablet) daily for 3 days then STOP --Always take with Food   pregabalin 150 MG capsule Commonly known as: LYRICA Take 150 mg by mouth 2 (two) times daily.   rizatriptan 10 MG disintegrating tablet Commonly known as: MAXALT-MLT Take 1 tablet (10 mg total) by mouth as needed for migraine. May repeat in 2 hours if needed   rizatriptan 10 MG tablet Commonly known as: MAXALT Take 10 mg by mouth 2 (two) times daily as needed.   rosuvastatin 5 MG tablet Commonly known as: CRESTOR Take 1 tablet (5 mg total) by mouth daily.   Vitamin D (Ergocalciferol) 1.25 MG (50000 UNIT) Caps capsule Commonly known as: DRISDOL Take 1 capsule (50,000 Units total) by mouth every 7 (seven) days.   Vitamin D3 1.25 MG (50000 UT) Tabs Take 1 tablet by mouth daily.   Xtampza ER 9 MG C12a Generic drug: oxyCODONE ER Xtampza ER 9 mg capsule sprinkle  Take 1 capsule every 12 hours by oral route.  Durable Medical Equipment  (From admission, onward)         Start     Ordered   07/01/20 1419  For home use only DME oxygen  Once       Comments: SATURATION QUALIFICATIONS: (Thisnote is usedto comply with regulatory documentation for home oxygen)  Patient Saturations on Room Air at Rest =92 %  Patient Saturations on Room Air while Ambulating =84%  Patient Saturations on2Liters of oxygen while Ambulating = 92%     Patient needs continuous O2 at 2 L/min continuously via nasal cannula with humidifier, with gaseous portability and conserving device     Diagnosis---BOOP  Question Answer Comment  Length of Need Lifetime   Mode or (Route) Nasal cannula   Liters per Minute 2   Frequency Continuous (stationary and portable oxygen unit needed)   Oxygen conserving device Yes   Oxygen delivery system Gas      07/01/20 1419          Discharge Assessment: Vitals:   07/01/20 0600 07/01/20 1347  BP: 109/70 (!) 100/54  Pulse: 79 96  Resp: 20 18  Temp: 98.1 F (36.7 C) 98.9 F (37.2 C)  SpO2: 100% 99%   Skin clean, dry and intact without evidence of skin break down, no evidence of skin tears noted. IV catheter discontinued intact. Site without signs and symptoms of complications - no redness or edema noted at insertion site, patient denies c/o pain - only slight tenderness at site.  Dressing with slight pressure applied.  D/c Instructions-Education: Discharge instructions given to patient/family with verbalized understanding. D/c education completed with patient/family including follow up instructions, medication list, d/c activities limitations if indicated, with other d/c instructions as indicated by MD - patient able to verbalize understanding, all questions fully answered. Patient instructed to return to ED, call 911, or call MD for any changes in condition.  Patient escorted via WC, and D/C home via private auto.  April Georgia, RN 07/01/2020 5:01 PM

## 2020-07-01 NOTE — Discharge Instructions (Signed)
1)Prednisone Taper---Take Prednisone 4 tablets (40mg ) daily for 4 days, then 3 tab (30mg ) daily for 4 days, then 2 tab (20mg ) daily x 3 days, then 10 mg (1 tablet) daily for 3 days then STOP --Always take with Food  2)You need oxygen at home at 2 L via nasal cannula continuously while awake and while asleep--- smoking or having open fires around oxygen can cause fire, significant injury and death  3)Follow - up with Dr. ---- Pulmonologist in [place]-  -Address: [address], [place], [place] [address] Phone: [phone] OR call [phone] -Patient needs to be seen in the [place] office, Not in [place]  4)Avoid ibuprofen/Advil/Aleve/Motrin/Goody Powders/Naproxen/BC powders/Meloxicam/Diclofenac/Indomethacin and other Nonsteroidal anti-inflammatory medications as these will make you more likely to bleed and can cause stomach ulcers, can also cause Kidney problems.   5)Smoking cessation counseling offered- Use Nicotine replacement therapy/Patch

## 2020-07-01 NOTE — Progress Notes (Signed)
SATURATION QUALIFICATIONS: (This note is used to comply with regulatory documentation for home oxygen)  Patient Saturations on Room Air at Rest = 92%  Patient Saturations on Room Air while Ambulating = 86%  Patient Saturations on 2 Liters of oxygen while Ambulating = 94%  Please briefly explain why patient needs home oxygen: 

## 2020-07-02 ENCOUNTER — Telehealth: Payer: Self-pay

## 2020-07-02 LAB — LEGIONELLA PNEUMOPHILA SEROGP 1 UR AG: L. pneumophila Serogp 1 Ur Ag: NEGATIVE

## 2020-07-02 NOTE — Telephone Encounter (Signed)
Transition Care Management Follow-up Telephone Call  Date of discharge and from where: 07/01/20  [facility]  How have you been since you were released from the hospital? Feeling ok under the circumstances  Any questions or concerns? No  Items Reviewed:  Did the pt receive and understand the discharge instructions provided? Yes   Medications obtained and verified? Yes   Other? Yes   Any new allergies since your discharge? No   Dietary orders reviewed? Yes  Do you have support at home? Yes   Home Care and Equipment/Supplies: Were home health services ordered? yes If so, what is the name of the agency? [facility]  Has the agency set up a time to come to the patient's home? yes Were any new equipment or medical supplies ordered?  Yes: came out yesterday What is the name of the medical supply agency? [facility] Were you able to get the supplies/equipment? yes Do you have any questions related to the use of the equipment or supplies? No  Functional Questionnaire: (I = Independent and D = Dependent) ADLs: I  But has help if she needs it  Bathing/Dressing- i  Meal Prep- i  Eating- i  Maintaining continence- i  Transferring/Ambulation- i  Managing Meds- i  Follow up appointments reviewed:   PCP Hospital f/u appt confirmed? Yes  Scheduled to see Dahlia Client on Jul 09, 2020 @ 11am.  Are transportation arrangements needed? No   If their condition worsens, is the pt aware to call PCP or go to the Emergency Dept.? Yes  Was the patient provided with contact information for the PCP's office or ED? Yes  Was to pt encouraged to call back with questions or concerns? Yes

## 2020-07-09 ENCOUNTER — Other Ambulatory Visit: Payer: Self-pay

## 2020-07-09 ENCOUNTER — Encounter: Payer: Self-pay | Admitting: Family Medicine

## 2020-07-09 ENCOUNTER — Telehealth (INDEPENDENT_AMBULATORY_CARE_PROVIDER_SITE_OTHER): Payer: BC Managed Care – PPO | Admitting: Family Medicine

## 2020-07-09 DIAGNOSIS — Z7689 Persons encountering health services in other specified circumstances: Secondary | ICD-10-CM | POA: Diagnosis not present

## 2020-07-09 DIAGNOSIS — F17218 Nicotine dependence, cigarettes, with other nicotine-induced disorders: Secondary | ICD-10-CM

## 2020-07-09 DIAGNOSIS — J8489 Other specified interstitial pulmonary diseases: Secondary | ICD-10-CM | POA: Insufficient documentation

## 2020-07-09 DIAGNOSIS — J9691 Respiratory failure, unspecified with hypoxia: Secondary | ICD-10-CM | POA: Diagnosis not present

## 2020-07-09 DIAGNOSIS — J189 Pneumonia, unspecified organism: Secondary | ICD-10-CM | POA: Insufficient documentation

## 2020-07-09 HISTORY — DX: Other specified interstitial pulmonary diseases: J84.89

## 2020-07-09 HISTORY — DX: Persons encountering health services in other specified circumstances: Z76.89

## 2020-07-09 MED ORDER — NICOTINE 14 MG/24HR TD PT24: 14.0000 mg | MEDICATED_PATCH | Freq: Every day | TRANSDERMAL | 0 refills | Status: DC

## 2020-07-09 NOTE — Assessment & Plan Note (Signed)
Improved, still using O2 as directed Advised to not smoke when using O2.  She is encourage to keep appt with pulm

## 2020-07-09 NOTE — Assessment & Plan Note (Signed)
This was what was questioned by Pulm She has finished the prednisone.  We will follow-up with him in a couple weeks.  Advised to reach out to him if her symptoms return or worsen.  Continue oxygen and smoking cessation nicotine patches something.

## 2020-07-09 NOTE — Assessment & Plan Note (Signed)
Review of chart, ED notes, progress notes admission notes, test, labs and results.  No needed follow-up from PCP at this time.  Has follow-up with pulmonary.  Has finished prednisone.  Encouraged the use of oxygen as prescribed.  Encourage smoking cessation nicotine patches have been sent in.

## 2020-07-09 NOTE — Assessment & Plan Note (Signed)
Asked about quitting: confirms they are currently smokes cigarettes Advise to quit smoking: Educated about QUITTING to reduce the risk of cancer, cardio and cerebrovascular disease. Assess willingness: Unwilling to quit at this time, but is working on cutting back. Assist with counseling and pharmacotherapy: Counseled for 5 minutes and literature provided. Arrange for follow up: working on quitting follow up in 3 months and continue to offer help.   Patches sent in

## 2020-07-09 NOTE — Patient Instructions (Signed)
  I appreciate the opportunity to provide you with care for your health and wellness.  Follow up: as scheduled   No labs or referrals today  Please continue to practice social distancing to keep you, your family, and our community safe.  If you must go out, please wear a mask and practice good handwashing.  It was a pleasure to see you and I look forward to continuing to work together on your health and well-being. Please do not hesitate to call the office if you need care or have questions about your care.  Have a wonderful day. With Gratitude, Tereasa Coop, DNP, AGNP-BC

## 2020-07-09 NOTE — Progress Notes (Signed)
Virtual Visit via Telephone Note   This visit type was conducted due to national recommendations for restrictions regarding the COVID-19 Pandemic (e.g. social distancing) in an effort to limit this patient's exposure and mitigate transmission in our community.  Due to her co-morbid illnesses, this patient is at least at moderate risk for complications without adequate follow up.  This format is felt to be most appropriate for this patient at this time.  The patient did not have access to video technology/had technical difficulties with video requiring transitioning to audio format only (telephone).  All issues noted in this document were discussed and addressed.  No physical exam could be performed with this format.   Evaluation Performed:  Follow-up visit  Date:  07/09/2020   ID:  April Ayala, DOB 08-Jan-1976, MRN [id number]  Patient Location: Home Provider Location: Office/Clinic   Participants: Nurse for intake and work up; Patient and Provider for Visit and Wrap up  Method of visit: Telephone Location of Patient: Home Location of Provider: Office Consent was obtain for visit over the telephone. Services rendered by provider: Visit was performed via telephone  I verified that I am speaking with the correct person using two identifiers.  PCP:  Perlie Mayo, NP   Chief Complaint:  TOC   History of Present Illness:    April Ayala is a 45 y.o. female with history as stated below.  Presented to the emergency room on December 25 with chest pain and shortness of breath.  Of note she had recently been discharged from the hospital about 3 to 4 weeks prior.  She reports that she was having pleuritic chest pain off and on for the past 24 hours prior to arriving to the emergency room.  Pain was made worse by coughing and deep inspiration. She denies hemoptysis.  She reported occasional wheezing.  She denies fevers, chills, sore throat, rhinorrhea, palpitations,  dizziness, diaphoresis, PND, orthopnea, swelling of her legs, headaches, body aches.  Additionally she denies having any abdominal pain, nausea, vomiting, diarrhea, constipation, changes in bowel or bladder habits.  No blood in urine or stool.  ED course went as follows initial vital signs demonstrate a pulse of 121, respirations 18 blood pressure 139/116, O2 sats 91% on room air.  She was given ceftriaxone and azithromycin IV, Toradol IV push.  Lab work demonstrated a normal CBC, D-dimer was 0.97, procalcitonin was 0.13, troponin was 4 and then 5, BNP 120, potassium 3.4.  All other electrolytes were in normal range.  Renal function within normal range.  AST was bumped a bit at 52, albumin was 3.3.  The rest of the hepatic function tests were within normal limits.  Chest x-ray showed low lung volumes with out acute or active cardiopulmonary disease.  CTA chest showed markedly severe diffuse bilateral infiltrates cholelithiasis but no PE.   Her admission diagnoses included acute hypoxic respiratory failure she was discharged home on 2 L of oxygen.  Set up with pulmonary appointment later in the month.  She was started on steroids and did have a bump in her WBC to 16.7 due to this.  Bronchodilators mucolytic's report.  Supplemental oxygen.  Pulmonary stopped her Rocephin and azithromycin.  She was discharged home on a prednisone taper and O2.  As stated above.   Today: Reports overall doing much better and breathing well.  She reports she still is smoking 3 to 4 cigarettes a day but she does not do that but she is  wearing her oxygen.  She has follow-up with pulmonary on January 14 this was the earliest appointment they can get.  As she needs to be seen in [place] and cannot travel to [place].  She denies having any active chest pain, shortness of breath, cough outside of her smoker's cough that she had prior to being admitted, orthopnea, leg swelling, palpitations, dizziness, blurred vision.  She has  finished her prednisone taper.  She is willing to have me send in nicotine patches as Chantix was recalled and she does not want to go back on this at this time.  The patient does not have symptoms concerning for COVID-19 infection (fever, chills, cough, or new shortness of breath).   Past Medical, Surgical, Social History, Allergies, and Medications have been Reviewed.  Past Medical History:  Diagnosis Date  . Anxiety   . Cervical cancer ([place]) 04/2011   Stage 1B squamous cell  . Chronic left shoulder pain 10/07/2017  . Chronic tension-type headache, not intractable 10/07/2017  . Class 2 obesity 09/29/2019  . Depression   . High cholesterol   . History of kidney stones    passed  . Prehypertension 06/26/2020  . S/P lumbar fusion 04/20/2018   Past Surgical History:  Procedure Laterality Date  . ABDOMINAL HYSTERECTOMY    . BREAST BIOPSY Right   . CERVICAL CONIZATION W/BX  03/24/2012   Procedure: CONIZATION CERVIX WITH BIOPSY;  Surgeon: Woodroe Mode, MD;  Location: [facility];  Service: Gynecology;  Laterality: N/A;  . DIAGNOSTIC LAPAROSCOPY  2007   ectopic  . ECTOPIC PREGNANCY SURGERY    . ESSURE TUBAL LIGATION  2010  . INSERTION OF SUPRAPUBIC CATHETER  05/03/2012   Procedure: INSERTION OF SUPRAPUBIC CATHETER;  Surgeon: Alvino Chapel, MD;  Location: [facility];  Service: Gynecology;;  . LEEP    . LUMBAR LAMINECTOMY/DECOMPRESSION MICRODISCECTOMY N/A 04/20/2018   Procedure: LUMBAR FIVE TO SACRAL ONE DECOMPRESSION AND FUSION, POSSIBLE LUMBAR FOUR TO SACRAL ONE; SCREW AND CAGE INSTRUMENTATION;  Surgeon: Melina Schools, MD;  Location: [facility];  Service: Orthopedics;  Laterality: N/A;  5 hrs  . LYMPHADENECTOMY  05/03/2012   Procedure: LYMPHADENECTOMY;  Surgeon: Alvino Chapel, MD;  Location: [facility];  Service: Gynecology;  Laterality: Bilateral;  pelvic  . RADICAL HYSTERECTOMY  05/03/2012   Procedure: RADICAL HYSTERECTOMY;  Surgeon: Alvino Chapel, MD;  Location: [facility];   Service: Gynecology;;  transposition of the ovaries  . SPINE SURGERY    . TUBAL LIGATION       Current Meds  Medication Sig  . acetaminophen (TYLENOL) 325 MG tablet Take 2 tablets (650 mg total) by mouth every 6 (six) hours as needed for mild pain (or Fever >/= 101).  Marland Kitchen albuterol (VENTOLIN HFA) 108 (90 Base) MCG/ACT inhaler Inhale 2 puffs into the lungs every 4 (four) hours as needed for wheezing or shortness of breath.  Marland Kitchen amitriptyline (ELAVIL) 75 MG tablet Take 1 tablet (75 mg total) by mouth at bedtime.  . benzonatate (TESSALON) 100 MG capsule Take 1 capsule (100 mg total) by mouth 3 (three) times daily as needed for cough.  . busPIRone (BUSPAR) 5 MG tablet Take 1 tablet (5 mg total) by mouth 2 (two) times daily.  . Cholecalciferol (VITAMIN D3) 1.25 MG (50000 UT) TABS Take 1 tablet by mouth daily.  . cyclobenzaprine (FLEXERIL) 10 MG tablet Take 10 mg by mouth 3 (three) times daily.   . fluticasone (FLONASE) 50 MCG/ACT nasal spray Place 2 sprays into both nostrils daily.  Marland Kitchen  fluticasone furoate-vilanterol (BREO ELLIPTA) 200-25 MCG/INH AEPB Inhale 1 puff into the lungs daily.  . Galcanezumab-gnlm (EMGALITY) 120 MG/ML SOAJ Inject 120 mg into the skin every 30 (thirty) days.  Marland Kitchen oxyCODONE (OXYCONTIN) 10 mg 12 hr tablet Take 10 mg by mouth every 12 (twelve) hours.   Marland Kitchen oxyCODONE ER (XTAMPZA ER) 9 MG C12A Xtampza ER 9 mg capsule sprinkle  Take 1 capsule every 12 hours by oral route.  Marland Kitchen oxyCODONE-acetaminophen (PERCOCET) 10-325 MG tablet Take 1 tablet by mouth 4 (four) times daily as needed.   . predniSONE (DELTASONE) 10 MG tablet Take 1 tablet (10 mg total) by mouth See admin instructions. Take 4 tablets (40mg ) daily for 4 days, then 3 tab (30mg ) daily for 4 days, then 2 tab (20mg ) daily x 3 days, then 10 mg (1 tablet) daily for 3 days then STOP --Always take with Food  . pregabalin (LYRICA) 150 MG capsule Take 150 mg by mouth 2 (two) times daily.  . rizatriptan (MAXALT) 10 MG tablet Take 10 mg by  mouth 2 (two) times daily as needed.  . rizatriptan (MAXALT-MLT) 10 MG disintegrating tablet Take 1 tablet (10 mg total) by mouth as needed for migraine. May repeat in 2 hours if needed  . rosuvastatin (CRESTOR) 5 MG tablet Take 1 tablet (5 mg total) by mouth daily.  . Vitamin D, Ergocalciferol, (DRISDOL) 1.25 MG (50000 UNIT) CAPS capsule Take 1 capsule (50,000 Units total) by mouth every 7 (seven) days.     Allergies:   Patient has no known allergies.   ROS:   Please see the history of present illness.    All other systems reviewed and are negative.   Labs/Other Tests and Data Reviewed:    Recent Labs: 09/29/2019: TSH 2.99 06/29/2020: Magnesium 1.9 06/30/2020: B Natriuretic Peptide 120.0 07/01/2020: ALT 23; BUN 14; Creatinine, Ser 0.64; Hemoglobin 12.1; Platelets 321; Potassium 4.6; Sodium 138   Recent Lipid Panel Lab Results  Component Value Date/Time   CHOL 189 09/29/2019 11:05 AM   TRIG 126 09/29/2019 11:05 AM   HDL 43 (L) 09/29/2019 11:05 AM   CHOLHDL 4.4 09/29/2019 11:05 AM   LDLCALC 122 (H) 09/29/2019 11:05 AM    Wt Readings from Last 3 Encounters:  06/30/20 220 lb 7.4 oz (100 kg)  06/26/20 218 lb (98.9 kg)  06/12/20 207 lb (93.9 kg)     Objective:    Vital Signs:  LMP 04/11/2012    VITAL SIGNS:  reviewed GEN:  no acute distress RESPIRATORY:  no shortness of breath noted  PSYCH:  normal affect   ASSESSMENT & PLAN:    1. Encounter for support and coordination of transition of care  2. Respiratory failure with hypoxia, unspecified chronicity (HCC)   3. Cigarette nicotine dependence with other nicotine-induced disorder   4. BOOP (bronchiolitis obliterans with organizing pneumonia) (HCC)     Time:   Today, I have spent 15 minutes with the patient with telehealth technology discussing the above problems, chart, labs, and notes.     Medication Adjustments/Labs and Tests Ordered: Current medicines are reviewed at length with the patient today.   Concerns regarding medicines are outlined above.   Tests Ordered: No orders of the defined types were placed in this encounter.   Medication Changes: No orders of the defined types were placed in this encounter.    Disposition:  Follow up 07/24/2020 as scheduled  Signed, 14/08/21, NP  07/09/2020 10:48 AM     07/26/2020 [facility]

## 2020-07-10 ENCOUNTER — Other Ambulatory Visit: Payer: Self-pay | Admitting: Family Medicine

## 2020-07-10 DIAGNOSIS — F419 Anxiety disorder, unspecified: Secondary | ICD-10-CM

## 2020-07-15 ENCOUNTER — Encounter: Payer: Self-pay | Admitting: Family Medicine

## 2020-07-16 NOTE — Telephone Encounter (Signed)
This is medication related for a medication she does not take daily, so she will need an appt to address.

## 2020-07-17 ENCOUNTER — Encounter: Payer: Self-pay | Admitting: Family Medicine

## 2020-07-17 ENCOUNTER — Other Ambulatory Visit: Payer: Self-pay

## 2020-07-17 ENCOUNTER — Telehealth (INDEPENDENT_AMBULATORY_CARE_PROVIDER_SITE_OTHER): Payer: BC Managed Care – PPO | Admitting: Family Medicine

## 2020-07-17 VITALS — Ht 63.0 in | Wt 210.0 lb

## 2020-07-17 DIAGNOSIS — K130 Diseases of lips: Secondary | ICD-10-CM

## 2020-07-17 HISTORY — DX: Diseases of lips: K13.0

## 2020-07-17 MED ORDER — FLUCONAZOLE 150 MG PO TABS: 150.0000 mg | ORAL_TABLET | Freq: Every day | ORAL | 0 refills | Status: DC

## 2020-07-17 MED ORDER — MUPIROCIN 2 % EX OINT: 1.0000 "application " | TOPICAL_OINTMENT | Freq: Two times a day (BID) | CUTANEOUS | 0 refills | Status: AC

## 2020-07-17 NOTE — Progress Notes (Signed)
Virtual Visit via Telephone Note   This visit type was conducted due to national recommendations for restrictions regarding the COVID-19 Pandemic (e.g. social distancing) in an effort to limit this patient's exposure and mitigate transmission in our community.  Due to her co-morbid illnesses, this patient is at least at moderate risk for complications without adequate follow up.  This format is felt to be most appropriate for this patient at this time.  The patient did not have access to video technology/had technical difficulties with video requiring transitioning to audio format only (telephone).  All issues noted in this document were discussed and addressed.  No physical exam could be performed with this format.     Evaluation Performed:  Follow-up visit  Date:  07/17/2020   ID:  April Ayala, DOB 02/08/1976, MRN [id number]  Patient Location: Home Provider Location: Office/Clinic   Participants: Nurse for intake and work up; Patient and Provider for Visit and Wrap up  Method of visit: Telephone  Location of Patient: Home Location of Provider: Office Consent was obtain for visit over the telephone. Services rendered by provider: Visit was performed via telephone  I verified that I am speaking with the correct person using two identifiers.  PCP:  Perlie Mayo, NP   Chief Complaint: Sores at the corner of her mouth  History of Present Illness:    April Ayala is a 45 y.o. female with a history as stated below. She reports that she has had a recurrence of questionable cold sores in the corners of her mouth. Recently has been treated for Boop/cap. Has finished all of her treatment and then reported the onset of these areas in the corners of her mouth. She has had these before and his question is whether or not impetigo versus cold sore herpes simplex. Description of them today sounds more like angular cheilitis, with a questionable fungi component. She is willing to  do treatment for this and follow-up if not improved within the next week or two. She denies have any other issues or concerns to discuss today. Denies having any recent injuries or changes with her hygiene or what she has been eating that could cause this to occur.    The patient does not have symptoms concerning for COVID-19 infection (fever, chills, cough, or new shortness of breath).   Past Medical, Surgical, Social History, Allergies, and Medications have been Reviewed.  Past Medical History:  Diagnosis Date  . Anxiety   . Cervical cancer ([place]) 04/2011   Stage 1B squamous cell  . Chronic left shoulder pain 10/07/2017  . Chronic tension-type headache, not intractable 10/07/2017  . Class 2 obesity 09/29/2019  . Depression   . High cholesterol   . History of kidney stones    passed  . Prehypertension 06/26/2020  . S/P lumbar fusion 04/20/2018   Past Surgical History:  Procedure Laterality Date  . ABDOMINAL HYSTERECTOMY    . BREAST BIOPSY Right   . CERVICAL CONIZATION W/BX  03/24/2012   Procedure: CONIZATION CERVIX WITH BIOPSY;  Surgeon: Woodroe Mode, MD;  Location: [facility];  Service: Gynecology;  Laterality: N/A;  . DIAGNOSTIC LAPAROSCOPY  2007   ectopic  . ECTOPIC PREGNANCY SURGERY    . ESSURE TUBAL LIGATION  2010  . INSERTION OF SUPRAPUBIC CATHETER  05/03/2012   Procedure: INSERTION OF SUPRAPUBIC CATHETER;  Surgeon: Alvino Chapel, MD;  Location: [facility];  Service: Gynecology;;  . LEEP    . LUMBAR LAMINECTOMY/DECOMPRESSION MICRODISCECTOMY  N/A 04/20/2018   Procedure: LUMBAR FIVE TO SACRAL ONE DECOMPRESSION AND FUSION, POSSIBLE LUMBAR FOUR TO SACRAL ONE; SCREW AND CAGE INSTRUMENTATION;  Surgeon: Melina Schools, MD;  Location: [facility];  Service: Orthopedics;  Laterality: N/A;  5 hrs  . LYMPHADENECTOMY  05/03/2012   Procedure: LYMPHADENECTOMY;  Surgeon: Alvino Chapel, MD;  Location: [facility];  Service: Gynecology;  Laterality: Bilateral;  pelvic  . RADICAL HYSTERECTOMY   05/03/2012   Procedure: RADICAL HYSTERECTOMY;  Surgeon: Alvino Chapel, MD;  Location: [facility];  Service: Gynecology;;  transposition of the ovaries  . SPINE SURGERY    . TUBAL LIGATION       Current Meds  Medication Sig  . acetaminophen (TYLENOL) 325 MG tablet Take 2 tablets (650 mg total) by mouth every 6 (six) hours as needed for mild pain (or Fever >/= 101).  Marland Kitchen albuterol (VENTOLIN HFA) 108 (90 Base) MCG/ACT inhaler Inhale 2 puffs into the lungs every 4 (four) hours as needed for wheezing or shortness of breath.  Marland Kitchen amitriptyline (ELAVIL) 75 MG tablet Take 1 tablet (75 mg total) by mouth at bedtime.  . benzonatate (TESSALON) 100 MG capsule Take 1 capsule (100 mg total) by mouth 3 (three) times daily as needed for cough.  . busPIRone (BUSPAR) 5 MG tablet Take 1 tablet by mouth twice daily  . Cholecalciferol (VITAMIN D3) 1.25 MG (50000 UT) TABS Take 1 tablet by mouth daily.  . cyclobenzaprine (FLEXERIL) 10 MG tablet Take 10 mg by mouth 3 (three) times daily.   . fluticasone (FLONASE) 50 MCG/ACT nasal spray Place 2 sprays into both nostrils daily.  . fluticasone furoate-vilanterol (BREO ELLIPTA) 200-25 MCG/INH AEPB Inhale 1 puff into the lungs daily.  . Galcanezumab-gnlm (EMGALITY) 120 MG/ML SOAJ Inject 120 mg into the skin every 30 (thirty) days.  . nicotine (NICODERM CQ) 14 mg/24hr patch Place 1 patch (14 mg total) onto the skin daily.  Marland Kitchen oxyCODONE (OXYCONTIN) 10 mg 12 hr tablet Take 10 mg by mouth every 12 (twelve) hours.   Marland Kitchen oxyCODONE ER (XTAMPZA ER) 9 MG C12A Xtampza ER 9 mg capsule sprinkle  Take 1 capsule every 12 hours by oral route.  Marland Kitchen oxyCODONE-acetaminophen (PERCOCET) 10-325 MG tablet Take 1 tablet by mouth 4 (four) times daily as needed.   . predniSONE (DELTASONE) 10 MG tablet Take 1 tablet (10 mg total) by mouth See admin instructions. Take 4 tablets (40mg ) daily for 4 days, then 3 tab (30mg ) daily for 4 days, then 2 tab (20mg ) daily x 3 days, then 10 mg (1 tablet) daily  for 3 days then STOP --Always take with Food  . pregabalin (LYRICA) 150 MG capsule Take 150 mg by mouth 2 (two) times daily.  . rizatriptan (MAXALT) 10 MG tablet Take 10 mg by mouth 2 (two) times daily as needed.  . rizatriptan (MAXALT-MLT) 10 MG disintegrating tablet Take 1 tablet (10 mg total) by mouth as needed for migraine. May repeat in 2 hours if needed  . rosuvastatin (CRESTOR) 5 MG tablet Take 1 tablet (5 mg total) by mouth daily.  . Vitamin D, Ergocalciferol, (DRISDOL) 1.25 MG (50000 UNIT) CAPS capsule Take 1 capsule (50,000 Units total) by mouth every 7 (seven) days.     Allergies:   Patient has no known allergies.   ROS:   Please see the history of present illness.    All other systems reviewed and are negative.   Labs/Other Tests and Data Reviewed:    Recent Labs: 09/29/2019: TSH 2.99 06/29/2020:  Magnesium 1.9 06/30/2020: B Natriuretic Peptide 120.0 07/01/2020: ALT 23; BUN 14; Creatinine, Ser 0.64; Hemoglobin 12.1; Platelets 321; Potassium 4.6; Sodium 138   Recent Lipid Panel Lab Results  Component Value Date/Time   CHOL 189 09/29/2019 11:05 AM   TRIG 126 09/29/2019 11:05 AM   HDL 43 (L) 09/29/2019 11:05 AM   CHOLHDL 4.4 09/29/2019 11:05 AM   LDLCALC 122 (H) 09/29/2019 11:05 AM    Wt Readings from Last 3 Encounters:  07/17/20 210 lb (95.3 kg)  06/30/20 220 lb 7.4 oz (100 kg)  06/26/20 218 lb (98.9 kg)     Objective:    Vital Signs:  Ht 5\' 3"  (1.6 m)   Wt 210 lb (95.3 kg)   LMP 04/11/2012   BMI 37.20 kg/m    VITAL SIGNS:  reviewed GEN:  no acute distress RESPIRATORY:  No shortness of breath noted in conversation PSYCH:  normal affect  ASSESSMENT & PLAN:    1. Angular cheilitis  - mupirocin ointment (BACTROBAN) 2 %; Apply 1 application topically 2 (two) times daily for 14 days.  Dispense: 28 g; Refill: 0 - fluconazole (DIFLUCAN) 150 MG tablet; Take 1 tablet (150 mg total) by mouth daily. Repeat in 3 days  Dispense: 2 tablet; Refill: 0  Time:    Today, I have spent 5 minutes with the patient with telehealth technology discussing the above problems.     Medication Adjustments/Labs and Tests Ordered: Current medicines are reviewed at length with the patient today.  Concerns regarding medicines are outlined above.   Tests Ordered: No orders of the defined types were placed in this encounter.   Medication Changes: No orders of the defined types were placed in this encounter.    Disposition:  Follow up as scheduled Signed, Perlie Mayo, NP  07/17/2020 11:13 AM     [place]

## 2020-07-17 NOTE — Assessment & Plan Note (Signed)
Treatment provided for staph related angular cheilitis additional use of Diflucan for possible fungal component does not report any thrush. Follow-up in 1 week

## 2020-07-17 NOTE — Patient Instructions (Signed)
  I appreciate the opportunity to provide you with care for your health and wellness.  Follow up: 07/24/2020 - as scheduled  No labs or referrals today  Take medication as ordered.  Please continue to practice social distancing to keep you, your family, and our community safe.  If you must go out, please wear a mask and practice good handwashing.  It was a pleasure to see you and I look forward to continuing to work together on your health and well-being. Please do not hesitate to call the office if you need care or have questions about your care.  Have a wonderful day. With Gratitude, Cherly Beach, DNP, AGNP-BC

## 2020-07-18 ENCOUNTER — Inpatient Hospital Stay: Payer: BC Managed Care – PPO | Admitting: Internal Medicine

## 2020-07-18 ENCOUNTER — Other Ambulatory Visit: Payer: Self-pay

## 2020-07-18 DIAGNOSIS — G43809 Other migraine, not intractable, without status migrainosus: Secondary | ICD-10-CM

## 2020-07-18 DIAGNOSIS — F419 Anxiety disorder, unspecified: Secondary | ICD-10-CM

## 2020-07-18 MED ORDER — AMITRIPTYLINE HCL 75 MG PO TABS: 75.0000 mg | ORAL_TABLET | Freq: Every day | ORAL | 1 refills | Status: DC

## 2020-07-18 MED ORDER — BUSPIRONE HCL 5 MG PO TABS: 5.0000 mg | ORAL_TABLET | Freq: Two times a day (BID) | ORAL | 0 refills | Status: DC
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
## 2020-07-19 ENCOUNTER — Telehealth: Payer: BC Managed Care – PPO | Admitting: Family Medicine

## 2020-07-19 ENCOUNTER — Inpatient Hospital Stay: Payer: BC Managed Care – PPO | Admitting: Internal Medicine

## 2020-07-19 ENCOUNTER — Other Ambulatory Visit: Payer: Self-pay

## 2020-07-23 ENCOUNTER — Encounter: Payer: Self-pay | Admitting: Family Medicine

## 2020-07-23 NOTE — Telephone Encounter (Signed)
Pt says it has been over 3 yrs and she seen him for shoulder pain. Also, when she had pneumonia in the hospital they told her not to take Phentermine so she has not been taking that. Wanted you aware.

## 2020-07-23 NOTE — Telephone Encounter (Signed)
Has she been seen by Dr Aline Brochure in the last 3 years or for her knee before? She might not need one, just needs to call there office to make an appt.

## 2020-07-24 ENCOUNTER — Ambulatory Visit: Payer: BC Managed Care – PPO | Admitting: Family Medicine

## 2020-07-24 NOTE — Telephone Encounter (Signed)
Yes, go ahead and do referral and remove adipex from her med list since she is not taking it.

## 2020-07-25 ENCOUNTER — Other Ambulatory Visit: Payer: Self-pay

## 2020-07-25 DIAGNOSIS — M25569 Pain in unspecified knee: Secondary | ICD-10-CM

## 2020-08-01 DIAGNOSIS — J9691 Respiratory failure, unspecified with hypoxia: Secondary | ICD-10-CM | POA: Diagnosis not present

## 2020-08-01 DIAGNOSIS — J84116 Cryptogenic organizing pneumonia: Secondary | ICD-10-CM | POA: Diagnosis not present

## 2020-08-02 ENCOUNTER — Encounter: Payer: Self-pay | Admitting: Family Medicine

## 2020-08-02 ENCOUNTER — Other Ambulatory Visit: Payer: Self-pay | Admitting: Family Medicine

## 2020-08-02 DIAGNOSIS — E7841 Elevated Lipoprotein(a): Secondary | ICD-10-CM

## 2020-08-05 ENCOUNTER — Other Ambulatory Visit: Payer: Self-pay | Admitting: *Deleted

## 2020-08-05 DIAGNOSIS — E7841 Elevated Lipoprotein(a): Secondary | ICD-10-CM

## 2020-08-05 MED ORDER — ROSUVASTATIN CALCIUM 5 MG PO TABS: 5.0000 mg | ORAL_TABLET | Freq: Every day | ORAL | 1 refills | Status: DC

## 2020-08-05 NOTE — Telephone Encounter (Signed)
Late entry from approx 230 pm: [facility] and obtained insurance info that they have on file for 2022.  BIN: [id number] [id number] ID: [id number]

## 2020-08-06 ENCOUNTER — Telehealth: Payer: Self-pay | Admitting: Adult Health

## 2020-08-06 NOTE — Telephone Encounter (Signed)
Pt called, have called and added other insurance. Waiting for PA from physician. Would like a call from the nurse.

## 2020-08-06 NOTE — Telephone Encounter (Signed)
PA completed on Cover My Meds. Key: [id number]. Awaiting determination from [employer].

## 2020-08-07 MED ORDER — RIZATRIPTAN BENZOATE 10 MG PO TBDP: 10.0000 mg | ORAL_TABLET | ORAL | 5 refills | Status: DC | PRN

## 2020-08-07 NOTE — Telephone Encounter (Signed)
Noted thanks. I updated the pt.

## 2020-08-07 NOTE — Telephone Encounter (Signed)
Request Reference Number: [id number]. EMGALITY INJ 120MG /ML is approved through 02/03/2021. Your patient may now fill this prescription and it will be covered.

## 2020-08-07 NOTE — Addendum Note (Signed)
Addended by: Gildardo Griffes on: 08/07/2020 08:14 AM   Modules accepted: Orders

## 2020-08-08 ENCOUNTER — Ambulatory Visit: Payer: BC Managed Care – PPO | Admitting: Orthopedic Surgery

## 2020-08-10 ENCOUNTER — Telehealth: Payer: BC Managed Care – PPO | Admitting: Physician Assistant

## 2020-08-10 DIAGNOSIS — J069 Acute upper respiratory infection, unspecified: Secondary | ICD-10-CM | POA: Diagnosis not present

## 2020-08-10 NOTE — Progress Notes (Signed)
I have spent 5 minutes in review of e-visit questionnaire, review and updating patient chart, medical decision making and response to patient.   Verdis Bassette Cody Yazleen Molock, PA-C    

## 2020-08-10 NOTE — Progress Notes (Signed)
We are sorry that you are not feeling well.  Here is how we plan to help!  Based on your presentation I believe you most likely have A cough due to a virus.  This is called viral bronchitis and is best treated by rest, plenty of fluids and control of the cough.  You may use Ibuprofen or Tylenol as directed to help your symptoms.  If symptoms worsen it is recommended you be COVID tested and quarantine until results are in. Either way, it is recommended you keep at home until feeling better.    In addition you may use A non-prescription cough medication called Mucinex DM: take 2 tablets every 12 hours.   From your responses in the eVisit questionnaire you describe inflammation in the upper respiratory tract which is causing a significant cough.  This is commonly called Bronchitis and has four common causes:    Allergies  Viral Infections  Acid Reflux  Bacterial Infection Allergies, viruses and acid reflux are treated by controlling symptoms or eliminating the cause. An example might be a cough caused by taking certain blood pressure medications. You stop the cough by changing the medication. Another example might be a cough caused by acid reflux. Controlling the reflux helps control the cough.  USE OF BRONCHODILATOR ("RESCUE") INHALERS: There is a risk from using your bronchodilator too frequently.  The risk is that over-reliance on a medication which only relaxes the muscles surrounding the breathing tubes can reduce the effectiveness of medications prescribed to reduce swelling and congestion of the tubes themselves.  Although you feel brief relief from the bronchodilator inhaler, your asthma may actually be worsening with the tubes becoming more swollen and filled with mucus.  This can delay other crucial treatments, such as oral steroid medications. If you need to use a bronchodilator inhaler daily, several times per day, you should discuss this with your provider.  There are probably better  treatments that could be used to keep your asthma under control.     HOME CARE . Only take medications as instructed by your medical team. . Complete the entire course of an antibiotic. . Drink plenty of fluids and get plenty of rest. . Avoid close contacts especially the very young and the elderly . Cover your mouth if you cough or cough into your sleeve. . Always remember to wash your hands . A steam or ultrasonic humidifier can help congestion.   GET HELP RIGHT AWAY IF: . You develop worsening fever. . You become short of breath . You cough up blood. . Your symptoms persist after you have completed your treatment plan MAKE SURE YOU   Understand these instructions.  Will watch your condition.  Will get help right away if you are not doing well or get worse.  Your e-visit answers were reviewed by a board certified advanced clinical practitioner to complete your personal care plan.  Depending on the condition, your plan could have included both over the counter or prescription medications. If there is a problem please reply  once you have received a response from your provider. Your safety is important to [place].  If you have drug allergies check your prescription carefully.    You can use MyChart to ask questions about today's visit, request a non-urgent call back, or ask for a work or school excuse for 24 hours related to this e-Visit. If it has been greater than 24 hours you will need to follow up with your provider, or enter a new e-Visit  to address those concerns. You will get an e-mail in the next two days asking about your experience.  I hope that your e-visit has been valuable and will speed your recovery. Thank you for using e-visits.

## 2020-08-12 ENCOUNTER — Other Ambulatory Visit: Payer: Self-pay

## 2020-08-12 ENCOUNTER — Ambulatory Visit: Payer: BC Managed Care – PPO

## 2020-08-12 ENCOUNTER — Encounter: Payer: Self-pay | Admitting: Orthopedic Surgery

## 2020-08-12 ENCOUNTER — Ambulatory Visit (INDEPENDENT_AMBULATORY_CARE_PROVIDER_SITE_OTHER): Payer: BC Managed Care – PPO | Admitting: Orthopedic Surgery

## 2020-08-12 VITALS — Ht 63.0 in | Wt 213.0 lb

## 2020-08-12 DIAGNOSIS — M222X2 Patellofemoral disorders, left knee: Secondary | ICD-10-CM

## 2020-08-12 DIAGNOSIS — F1190 Opioid use, unspecified, uncomplicated: Secondary | ICD-10-CM

## 2020-08-12 DIAGNOSIS — M25562 Pain in left knee: Secondary | ICD-10-CM

## 2020-08-12 NOTE — Patient Instructions (Addendum)
Patellofemoral Pain Syndrome  Patellofemoral pain syndrome is a condition in which the tissue (cartilage) on the underside of the kneecap (patella) softens or breaks down. This causes pain in the front of the knee. The condition is also called runner's knee or chondromalacia patella. Patellofemoral pain syndrome is most common in young adults who are active in sports. The knee is the largest joint in the body. The patella covers the front of the knee and is attached to muscles above and below the knee. The underside of the patella is covered with a smooth type of cartilage (synovium). The smooth surface helps the patella glide easily when you move your knee. Patellofemoral pain syndrome causes swelling in the joint linings and bone surfaces in the knee. What are the causes? This condition may be caused by:  Overuse of the knee.  Poor alignment of your knee joints.  Weak leg muscles.  A direct hit to your kneecap. What increases the risk? You are more likely to develop this condition if:  You do a lot of activities that can wear down your kneecap. These include: ? Running. ? Squatting. ? Climbing stairs.  You start a new physical activity or exercise program.  You wear shoes that do not fit well.  You do not have good leg strength.  You are overweight. What are the signs or symptoms? The main symptom of this condition is knee pain. This may feel like a dull, aching pain underneath your patella, in the front of your knee. There may be a popping or cracking sound when you move your knee. Pain may get worse with:  Exercise.  Climbing stairs.  Running.  Jumping.  Squatting.  Kneeling.  Sitting for a long time.  Moving or pushing on your patella. How is this diagnosed? This condition may be diagnosed based on:  Your symptoms and medical history. You may be asked about your recent physical activities and which ones cause knee pain.  A physical exam. This may  include: ? Moving your patella back and forth. ? Checking your range of knee motion. ? Having you squat or jump to see if you have pain. ? Checking the strength of your leg muscles.  Imaging tests to confirm the diagnosis. These may include an MRI of your knee. How is this treated? This condition may be treated at home with rest, ice, compression, and elevation (RICE).  Other treatments may include:  NSAIDs, such as ibuprofen.  Physical therapy to stretch and strengthen your leg muscles.  Shoe inserts (orthotics) to take stress off your knee.  A knee brace or knee support.  Adhesive tapes to the skin.  Surgery to remove damaged cartilage or move the patella to a better position. This is rare. Follow these instructions at home: If you have a brace:  Wear the brace as told by your health care provider. Remove it only as told by your health care provider.  Loosen the brace if your toes tingle, become numb, or turn cold and blue.  Keep the brace clean.  If the brace is not waterproof: ? Do not let it get wet. ? Cover it with a watertight covering when you take a bath or a shower. Managing pain, stiffness, and swelling  If directed, put ice on the painful area. To do this: ? If you have a removable brace, remove it as told by your health care provider. ? Put ice in a plastic bag. ? Place a towel between your skin and the bag. ?  Leave the ice on for 20 minutes, 2-3 times a day. ? Remove the ice if your skin turns bright red. This is very important. If you cannot feel pain, heat, or cold, you have a greater risk of damage to the area.  Move your toes often to reduce stiffness and swelling.  Raise (elevate) the injured area above the level of your heart while you are sitting or lying down.   Activity  Rest your knee.  Avoid activities that cause knee pain.  Perform stretching and strengthening exercises as told by your health care provider or physical therapist.  Return  to your normal activities as told by your health care provider. Ask your health care provider what activities are safe for you. General instructions  Take over-the-counter and prescription medicines only as told by your health care provider.  Use splints, braces, knee supports, or walking aids as directed by your health care provider.  Do not use any products that contain nicotine or tobacco, such as cigarettes, e-cigarettes, and chewing tobacco. These can delay healing. If you need help quitting, ask your health care provider.  Keep all follow-up visits. This is important. Contact a health care provider if:  Your symptoms get worse.  You are not improving with home care. Summary  Patellofemoral pain syndrome is a condition in which the tissue (cartilage) on the underside of the kneecap (patella) softens or breaks down.  This condition causes swelling in the joint linings and bone surfaces in the knee. This leads to pain in the front of the knee.  This condition may be treated at home with rest, ice, compression, and elevation (RICE).  Use splints, braces, knee supports, or walking aids as directed by your health care provider. This information is not intended to replace advice given to you by your health care provider. Make sure you discuss any questions you have with your health care provider. Document Revised: 12/06/2019 Document Reviewed: 12/06/2019 Elsevier Patient Education  2021 [employer].

## 2020-08-12 NOTE — Progress Notes (Signed)
patellNEW PROBLEM//OFFICE VISIT  Summary assessment and plan:   Encounter Diagnoses  Name Primary?  . Acute pain of left knee Yes  . Patellofemoral pain syndrome of left knee   . Opioid use     45 year old female with patellofemoral pain syndrome, recommend patellofemoral exercises through physical therapy use topical medications as the patient is already on opioid therapy for other chronic pain  Surgery not indicated   Chief Complaint  Patient presents with  . Knee Pain    Left knee, Pt reports pain x 1 month,     45 year old female with atraumatic onset of pain left knee x1 month  Complains of pain in the back of the knee and anterior around the patellofemoral joint with painful flexion.  No catching locking or giving way   Review of Systems  Psychiatric/Behavioral: Positive for depression and suicidal ideas.  All other systems reviewed and are negative.   No orders of the defined types were placed in this encounter.   Current Outpatient Medications:  .  albuterol (VENTOLIN HFA) 108 (90 Base) MCG/ACT inhaler, Inhale 2 puffs into the lungs every 4 (four) hours as needed for wheezing or shortness of breath., Disp: 18 g, Rfl: 2 .  amitriptyline (ELAVIL) 75 MG tablet, Take 1 tablet (75 mg total) by mouth at bedtime., Disp: 30 tablet, Rfl: 1 .  benzonatate (TESSALON) 100 MG capsule, Take 1 capsule (100 mg total) by mouth 3 (three) times daily as needed for cough., Disp: 20 capsule, Rfl: 0 .  busPIRone (BUSPAR) 5 MG tablet, Take 1 tablet (5 mg total) by mouth 2 (two) times daily., Disp: 60 tablet, Rfl: 0 .  Cholecalciferol (VITAMIN D3) 1.25 MG (50000 UT) TABS, Take 1 tablet by mouth daily., Disp: , Rfl:  .  cyclobenzaprine (FLEXERIL) 10 MG tablet, Take 10 mg by mouth 3 (three) times daily. , Disp: , Rfl:  .  fluticasone (FLONASE) 50 MCG/ACT nasal spray, Place 2 sprays into both nostrils daily., Disp: 16 g, Rfl: 0 .  fluticasone furoate-vilanterol (BREO ELLIPTA) 200-25 MCG/INH  AEPB, Inhale 1 puff into the lungs daily., Disp: 30 each, Rfl: 2 .  Galcanezumab-gnlm (EMGALITY) 120 MG/ML SOAJ, Inject 120 mg into the skin every 30 (thirty) days., Disp: 1 mL, Rfl: 10 .  nicotine (NICODERM CQ) 14 mg/24hr patch, Place 1 patch (14 mg total) onto the skin daily., Disp: 28 patch, Rfl: 0 .  oxyCODONE (OXYCONTIN) 10 mg 12 hr tablet, Take 10 mg by mouth every 12 (twelve) hours. , Disp: , Rfl:  .  oxyCODONE-acetaminophen (PERCOCET) 10-325 MG tablet, Take 1 tablet by mouth 4 (four) times daily as needed. , Disp: , Rfl:  .  pregabalin (LYRICA) 150 MG capsule, Take 150 mg by mouth 2 (two) times daily., Disp: , Rfl:  .  rizatriptan (MAXALT-MLT) 10 MG disintegrating tablet, Take 1 tablet (10 mg total) by mouth as needed for migraine. May repeat in 2 hours if needed, Disp: 9 tablet, Rfl: 5 .  rosuvastatin (CRESTOR) 5 MG tablet, Take 1 tablet by mouth once daily, Disp: 30 tablet, Rfl: 0 .  rosuvastatin (CRESTOR) 5 MG tablet, Take 1 tablet (5 mg total) by mouth daily., Disp: 30 tablet, Rfl: 1 .  Vitamin D, Ergocalciferol, (DRISDOL) 1.25 MG (50000 UNIT) CAPS capsule, Take 1 capsule (50,000 Units total) by mouth every 7 (seven) days., Disp: 4 capsule, Rfl: 3  Past Medical History:  Diagnosis Date  . Anxiety   . Cervical cancer ([place]) 04/2011   Stage 1B squamous cell  .  Chronic left shoulder pain 10/07/2017  . Chronic tension-type headache, not intractable 10/07/2017  . Class 2 obesity 09/29/2019  . Depression   . High cholesterol   . History of kidney stones    passed  . Prehypertension 06/26/2020  . S/P lumbar fusion 04/20/2018    Past Surgical History:  Procedure Laterality Date  . ABDOMINAL HYSTERECTOMY    . BREAST BIOPSY Right   . CERVICAL CONIZATION W/BX  03/24/2012   Procedure: CONIZATION CERVIX WITH BIOPSY;  Surgeon: Adam Phenix, MD;  Location: [facility];  Service: Gynecology;  Laterality: N/A;  . DIAGNOSTIC LAPAROSCOPY  2007   ectopic  . ECTOPIC PREGNANCY SURGERY    . ESSURE  TUBAL LIGATION  2010  . INSERTION OF SUPRAPUBIC CATHETER  05/03/2012   Procedure: INSERTION OF SUPRAPUBIC CATHETER;  Surgeon: Jeannette Corpus, MD;  Location: [facility];  Service: Gynecology;;  . LEEP    . LUMBAR LAMINECTOMY/DECOMPRESSION MICRODISCECTOMY N/A 04/20/2018   Procedure: LUMBAR FIVE TO SACRAL ONE DECOMPRESSION AND FUSION, POSSIBLE LUMBAR FOUR TO SACRAL ONE; SCREW AND CAGE INSTRUMENTATION;  Surgeon: Venita Lick, MD;  Location: [facility];  Service: Orthopedics;  Laterality: N/A;  5 hrs  . LYMPHADENECTOMY  05/03/2012   Procedure: LYMPHADENECTOMY;  Surgeon: Jeannette Corpus, MD;  Location: [facility];  Service: Gynecology;  Laterality: Bilateral;  pelvic  . RADICAL HYSTERECTOMY  05/03/2012   Procedure: RADICAL HYSTERECTOMY;  Surgeon: Jeannette Corpus, MD;  Location: [facility];  Service: Gynecology;;  transposition of the ovaries  . SPINE SURGERY    . TUBAL LIGATION      Family History  Problem Relation Age of Onset  . Diabetes Mother   . Hypertension Father   . Diabetes Sister   . Migraines Sister   . Hypothyroidism Sister   . Migraines Sister   . Migraines Sister    Social History   Tobacco Use  . Smoking status: Former Smoker    Packs/day: 0.50    Years: 1.00    Pack years: 0.50    Types: Cigarettes    Start date: 11/03/2018  . Smokeless tobacco: Never Used  . Tobacco comment: smoking cessation information given  Vaping Use  . Vaping Use: Never used  Substance Use Topics  . Alcohol use: Never  . Drug use: Never    No Known Allergies  Current Meds  Medication Sig  . albuterol (VENTOLIN HFA) 108 (90 Base) MCG/ACT inhaler Inhale 2 puffs into the lungs every 4 (four) hours as needed for wheezing or shortness of breath.  Marland Kitchen amitriptyline (ELAVIL) 75 MG tablet Take 1 tablet (75 mg total) by mouth at bedtime.  . benzonatate (TESSALON) 100 MG capsule Take 1 capsule (100 mg total) by mouth 3 (three) times daily as needed for cough.  . busPIRone (BUSPAR) 5 MG  tablet Take 1 tablet (5 mg total) by mouth 2 (two) times daily.  . Cholecalciferol (VITAMIN D3) 1.25 MG (50000 UT) TABS Take 1 tablet by mouth daily.  . cyclobenzaprine (FLEXERIL) 10 MG tablet Take 10 mg by mouth 3 (three) times daily.   . fluticasone (FLONASE) 50 MCG/ACT nasal spray Place 2 sprays into both nostrils daily.  . fluticasone furoate-vilanterol (BREO ELLIPTA) 200-25 MCG/INH AEPB Inhale 1 puff into the lungs daily.  . Galcanezumab-gnlm (EMGALITY) 120 MG/ML SOAJ Inject 120 mg into the skin every 30 (thirty) days.  . nicotine (NICODERM CQ) 14 mg/24hr patch Place 1 patch (14 mg total) onto the skin daily.  Marland Kitchen oxyCODONE (OXYCONTIN) 10 mg  12 hr tablet Take 10 mg by mouth every 12 (twelve) hours.   Marland Kitchen oxyCODONE-acetaminophen (PERCOCET) 10-325 MG tablet Take 1 tablet by mouth 4 (four) times daily as needed.   . pregabalin (LYRICA) 150 MG capsule Take 150 mg by mouth 2 (two) times daily.  . rizatriptan (MAXALT-MLT) 10 MG disintegrating tablet Take 1 tablet (10 mg total) by mouth as needed for migraine. May repeat in 2 hours if needed  . rosuvastatin (CRESTOR) 5 MG tablet Take 1 tablet by mouth once daily  . rosuvastatin (CRESTOR) 5 MG tablet Take 1 tablet (5 mg total) by mouth daily.  . Vitamin D, Ergocalciferol, (DRISDOL) 1.25 MG (50000 UNIT) CAPS capsule Take 1 capsule (50,000 Units total) by mouth every 7 (seven) days.    Ht 5\' 3"  (1.6 m)   Wt 213 lb (96.6 kg)   LMP 04/11/2012   BMI 37.73 kg/m   Physical Exam Constitutional:      General: She is not in acute distress.    Appearance: She is well-developed.     Comments: Well developed, well nourished Normal grooming and hygiene     Cardiovascular:     Comments: No peripheral edema Skin:    General: Skin is warm and dry.  Neurological:     Mental Status: She is alert and oriented to person, place, and time.     Sensory: No sensory deficit.     Coordination: Coordination normal.     Gait: Gait normal.     Deep Tendon  Reflexes: Reflexes are normal and symmetric.  Psychiatric:        Mood and Affect: Mood normal.        Behavior: Behavior normal.        Thought Content: Thought content normal.        Judgment: Judgment normal.     Comments: Affect normal      MEDICAL DECISION MAKING  A.  Encounter Diagnoses  Name Primary?  . Acute pain of left knee Yes  . Patellofemoral pain syndrome of left knee   . Opioid use     B. DATA ANALYSED:   IMAGING: Interpretation of images: internal  Normal alignment in the left knee patellofemoral lateral facet overhang  Orders: Physical therapy  Outside records reviewed: None   C. MANAGEMENT   Physical therapy and topical medication  No orders of the defined types were placed in this encounter.     Arther Abbott, MD  08/12/2020 9:24 AM

## 2020-08-15 ENCOUNTER — Encounter: Payer: Self-pay | Admitting: Family Medicine

## 2020-08-16 ENCOUNTER — Other Ambulatory Visit: Payer: Self-pay | Admitting: *Deleted

## 2020-08-16 DIAGNOSIS — F419 Anxiety disorder, unspecified: Secondary | ICD-10-CM

## 2020-08-16 MED ORDER — BUSPIRONE HCL 5 MG PO TABS: 5.0000 mg | ORAL_TABLET | Freq: Two times a day (BID) | ORAL | 0 refills | Status: DC

## 2020-08-26 ENCOUNTER — Other Ambulatory Visit: Payer: Self-pay

## 2020-08-26 ENCOUNTER — Ambulatory Visit (HOSPITAL_COMMUNITY): Payer: BC Managed Care – PPO | Attending: Orthopedic Surgery | Admitting: Physical Therapy

## 2020-08-26 ENCOUNTER — Encounter (HOSPITAL_COMMUNITY): Payer: Self-pay | Admitting: Physical Therapy

## 2020-08-26 DIAGNOSIS — M25562 Pain in left knee: Secondary | ICD-10-CM | POA: Diagnosis present

## 2020-08-26 DIAGNOSIS — M6281 Muscle weakness (generalized): Secondary | ICD-10-CM | POA: Insufficient documentation

## 2020-08-26 DIAGNOSIS — M5441 Lumbago with sciatica, right side: Secondary | ICD-10-CM | POA: Diagnosis present

## 2020-08-26 DIAGNOSIS — G8929 Other chronic pain: Secondary | ICD-10-CM | POA: Insufficient documentation

## 2020-08-26 NOTE — Therapy (Signed)
[place], [place], [address] Phone: [phone]   Fax:  [phone]  Physical Therapy Evaluation  Patient Details  Name: April Ayala MRN: [id number] Date of Birth: 1975-09-12 Referring Provider (PT): Melina Schools MD   Encounter Date: 08/26/2020   PT End of Session - 08/26/20 1702    Visit Number 1    Number of Visits 12    Date for PT Re-Evaluation 10/07/20    Authorization Type BCBS - VL 20, secondary healthy blue medicaid (no auth needed as secondary)    Authorization - Visit Number 1    Authorization - Number of Visits 20    Progress Note Due on Visit 10    PT Start Time 6433   pt late to session   PT Stop Time 1657    PT Time Calculation (min) 36 min    Activity Tolerance Patient limited by pain    Behavior During Therapy [facility] for tasks assessed/performed           Past Medical History:  Diagnosis Date  . Anxiety   . Cervical cancer ([place]) 04/2011   Stage 1B squamous cell  . Chronic left shoulder pain 10/07/2017  . Chronic tension-type headache, not intractable 10/07/2017  . Class 2 obesity 09/29/2019  . Depression   . High cholesterol   . History of kidney stones    passed  . Prehypertension 06/26/2020  . S/P lumbar fusion 04/20/2018    Past Surgical History:  Procedure Laterality Date  . ABDOMINAL HYSTERECTOMY    . BREAST BIOPSY Right   . CERVICAL CONIZATION W/BX  03/24/2012   Procedure: CONIZATION CERVIX WITH BIOPSY;  Surgeon: Woodroe Mode, MD;  Location: [facility];  Service: Gynecology;  Laterality: N/A;  . DIAGNOSTIC LAPAROSCOPY  2007   ectopic  . ECTOPIC PREGNANCY SURGERY    . ESSURE TUBAL LIGATION  2010  . INSERTION OF SUPRAPUBIC CATHETER  05/03/2012   Procedure: INSERTION OF SUPRAPUBIC CATHETER;  Surgeon: Alvino Chapel, MD;  Location: [facility];  Service: Gynecology;;  . LEEP    . LUMBAR LAMINECTOMY/DECOMPRESSION MICRODISCECTOMY N/A 04/20/2018   Procedure: LUMBAR FIVE TO  SACRAL ONE DECOMPRESSION AND FUSION, POSSIBLE LUMBAR FOUR TO SACRAL ONE; SCREW AND CAGE INSTRUMENTATION;  Surgeon: Melina Schools, MD;  Location: [facility];  Service: Orthopedics;  Laterality: N/A;  5 hrs  . LYMPHADENECTOMY  05/03/2012   Procedure: LYMPHADENECTOMY;  Surgeon: Alvino Chapel, MD;  Location: [facility];  Service: Gynecology;  Laterality: Bilateral;  pelvic  . RADICAL HYSTERECTOMY  05/03/2012   Procedure: RADICAL HYSTERECTOMY;  Surgeon: Alvino Chapel, MD;  Location: [facility];  Service: Gynecology;;  transposition of the ovaries  . SPINE SURGERY    . TUBAL LIGATION      There were no vitals filed for this visit.    Subjective Assessment - 08/26/20 1630    Subjective States her back pain started May 31st 2019 where she tripped and fell over a metal cart and she fell on the metal part of the cart. States the pain progressively worsened that she had surgery in 05/2018.  Current pain is 5/10 sharp shooting and it extends to the right leg on both front and back sides but does not enter the foot. States that standing, bending and lifting make it feel worth. States that nothing makes It feel better. States that the pain meds help a little. States that her pain has progressively worsened since surgery.  Pertinent History lumbar fusion 2019    Currently in Pain? Yes    Pain Score 5     Pain Location Back    Pain Orientation Right    Pain Descriptors / Indicators Aching;Sharp    Pain Type Chronic pain    Pain Radiating Towards down into right leg    Pain Onset More than a month ago    Pain Frequency Constant    Aggravating Factors  bending and lifting    Pain Relieving Factors medications              [facility] PT Assessment - 08/26/20 0001      Assessment   Medical Diagnosis low back pain    Referring Provider (PT) Melina Schools MD    Next MD Visit none currently    Prior Therapy yes prior to surgery      Balance Screen   Has the patient fallen in the past 6 months  No      [place] residence    Living Arrangements Children;Spouse/significant other    Available Help at Discharge Family    Type of [place] to enter    Entrance Stairs-Number of Steps 8    Entrance Stairs-Rails Right    [place] One level      Prior Function   Level of Independence Independent    Vocation Full time employment    Vocation Requirements [employer]   standing and walking     Cognition   Overall Cognitive Status Within Functional Limits for tasks assessed      Observation/Other Assessments   Focus on Therapeutic Outcomes (FOTO)  NA    Other Surveys  Oswestry Disability Index    Oswestry Disability Index  25% disability (75% function)      ROM / Strength   AROM / PROM / Strength AROM;Strength      AROM   AROM Assessment Site Lumbar    Lumbar Flexion 75% limited pain on right side    Lumbar Extension 100% limited pain on left side    Lumbar - Right Side Bend 75% limited    Lumbar - Left Side Bend 75% limited pain on right      Strength   Strength Assessment Site Hip;Knee;Ankle    Right/Left Hip Right;Left    Right Hip Flexion 4/5    Right Hip Extension 3-/5    Left Hip Flexion 3/5   pain in knee   Left Hip Extension 4-/5    Right/Left Knee Right;Left    Right Knee Flexion 4/5    Right Knee Extension 5/5    Left Knee Flexion 4-/5   pain in knee   Left Knee Extension 4+/5    Right/Left Ankle Right;Left    Right Ankle Dorsiflexion 5/5    Left Ankle Dorsiflexion 5/5      Flexibility   Soft Tissue Assessment /Muscle Length yes    Quadriceps right > Left slight pain wiht ely's test on right in right glute max      Palpation   Spinal mobility hypomobility in lumbar spine and pain with L4/5 spring testing    Palpation comment tenderness to palpation along right glutes, lumbar paraspinals- increased resting tone noted in this area      Special Tests    Special Tests Lumbar    Lumbar  Tests Slump Test;Straight Leg Raise      Slump test   Findings  Negative      Straight Leg Raise   Findings Negative      Ambulation/Gait   Ambulation/Gait Yes    Ambulation/Gait Assistance 7: Independent    Ambulation Distance (Feet) 280 Feet    Assistive device None    Gait Comments 2MW, increase in pain on the right side after 1 minute                      Objective measurements completed on examination: See above findings.       [facility] Adult PT Treatment/Exercise - 08/26/20 0001      Exercises   Exercises Knee/Hip      Knee/Hip Exercises: Seated   Other Seated Knee/Hip Exercises glute squeezes 3x5 5"holds      Knee/Hip Exercises: Prone   Other Prone Exercises glute isometrics 3x5 5" holds                  PT Education - 08/26/20 1701    Education Details on current presentation, POC, benefits of aquatics and PT    Person(s) Educated Patient    Methods Explanation    Comprehension Verbalized understanding            PT Short Term Goals - 08/26/20 1654      PT SHORT TERM GOAL #1   Title Patient will be independent in self management strategies to improve quality of life and functional outcomes.    Time 3    Period Weeks    Status New    Target Date 09/16/20      PT SHORT TERM GOAL #2   Title Patient will be able to lift right leg through full ROM without severe pain to demonstrate improved active hip extension    Time 3    Period Weeks    Status New    Target Date 09/16/20      PT SHORT TERM GOAL #3   Title Patient will report at least 25% improvement in overall symptoms and/or function to demonstrate improved functional mobility    Time 3    Period Weeks    Status New    Target Date 09/16/20             PT Long Term Goals - 08/26/20 1655      PT LONG TERM GOAL #1   Title Patient will report at least 50% improvement in overall symptoms and/or function to demonstrate improved functional mobility    Baseline Patient will  be able to walk for at least 5 minutes without increase in base line lumbar pain    Time 6    Period Weeks    Status New    Target Date 10/07/20      PT LONG TERM GOAL #2   Title Patient will score with <20% disability on oswestry disability index    Time 6    Period Weeks    Status New    Target Date 10/07/20      PT LONG TERM GOAL #3   Title Patient will be able to stand and walk for at least 10 minutes without increase in baseline pain to improve funcitonal walking endurance    Time 6    Period Weeks    Status New    Target Date 10/07/20                  Plan - 08/26/20 1653    Clinical Impression Statement Patient presents to therapy with  chronic low back pain after a fall resulting in a lumbar fusion in 2019. Patient also presents to therapy with left knee pain but would like to focus on lumbar spine at this time. Patient presents with strength and ROM deficits that are greatly limiting patient's overall function and quality of life. Patient would benefit from skilled physical therapy to improve return her to optimal function.    Personal Factors and Comorbidities Comorbidity 1    Comorbidities lumbar fusion in 2019    Examination-Activity Limitations Squat;Sleep;Stand;Lift;Locomotion Level;Bend    Examination-Participation Restrictions Occupation;Meal Prep;Shop    Stability/Clinical Decision Making Stable/Uncomplicated    Clinical Decision Making Low    Rehab Potential Fair    PT Frequency 2x / week    PT Duration 6 weeks    PT Treatment/Interventions ADLs/Self Care Home Management;Aquatic Therapy;Cryotherapy;Electrical Stimulation;Moist Heat;Traction;Balance training;Therapeutic exercise;Therapeutic activities;Functional mobility training;Stair training;Gait training;DME Instruction;Neuromuscular re-education;Patient/family education;Manual techniques;Dry needling    PT Next Visit Plan aquatics, isometrics, lumbar and hip ROM, core strengthening    PT Home Exercise  Plan glute squeezes    Consulted and Agree with Plan of Care Patient           Patient will benefit from skilled therapeutic intervention in order to improve the following deficits and impairments:  Pain,Decreased strength,Decreased activity tolerance,Decreased mobility,Hypomobility  Visit Diagnosis: Chronic right-sided low back pain with right-sided sciatica  Muscle weakness (generalized)  Chronic pain of left knee     Problem List Patient Active Problem List   Diagnosis Date Noted  . Angular cheilitis 07/17/2020  . Encounter for support and coordination of transition of care 07/09/2020  . 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 Hypokalemia 06/30/2020  . Hyperlipidemia 06/30/2020  . Anxiety with depression 06/30/2020  . Respiratory failure with hypoxia ([place]) 06/30/2020  . Prehypertension 06/26/2020  . Facial pain, acute 06/05/2020  . Acute low back pain 05/21/2020  . Tachycardia determined by examination of pulse 05/17/2020  . Soreness of tongue 04/23/2020  . Cold sore 04/11/2020  . Pain in joint of right elbow 03/20/2020  . Right tennis elbow 02/21/2020  . Cough with expectoration 02/14/2020  . Vaginal Pap smear following hysterectomy for malignancy 12/18/2019  . Screening for colorectal cancer 12/18/2019  . RLQ abdominal pain 12/18/2019  . History of cervical cancer 10/27/2019  . Environmental and seasonal allergies 09/29/2019  . Nicotine dependence 09/29/2019  . Binge eating 09/29/2019  . Migraine 09/29/2019  . Bilateral carpal tunnel syndrome 09/29/2019  . Encounter for screening mammogram for malignant neoplasm of breast 09/29/2019  . Class 2 obesity 09/29/2019  . Family history of hyperlipidemia 09/29/2019  . Family history of diabetes mellitus 09/29/2019  . Family history of hypothyroidism 09/29/2019  . Other fatigue 09/29/2019  . Vitamin D deficiency 09/29/2019   5:05 PM, 08/26/20 Jerene Pitch, DPT Physical  Therapy with [facility]  [phone] office  [place] [address] [place], [place], [address] Phone: [phone]   Fax:  [phone]  Name: April Ayala MRN: [id number] Date of Birth: 1975-07-23

## 2020-08-27 ENCOUNTER — Ambulatory Visit: Payer: BC Managed Care – PPO | Admitting: Nurse Practitioner

## 2020-08-28 ENCOUNTER — Other Ambulatory Visit: Payer: Self-pay

## 2020-08-28 ENCOUNTER — Encounter (HOSPITAL_COMMUNITY): Payer: Self-pay

## 2020-08-28 ENCOUNTER — Ambulatory Visit (HOSPITAL_COMMUNITY): Payer: BC Managed Care – PPO

## 2020-08-28 DIAGNOSIS — M5441 Lumbago with sciatica, right side: Secondary | ICD-10-CM | POA: Diagnosis not present

## 2020-08-28 DIAGNOSIS — G8929 Other chronic pain: Secondary | ICD-10-CM

## 2020-08-28 DIAGNOSIS — M6281 Muscle weakness (generalized): Secondary | ICD-10-CM

## 2020-08-28 DIAGNOSIS — M25562 Pain in left knee: Secondary | ICD-10-CM

## 2020-08-28 NOTE — Patient Instructions (Signed)
Isometric Abdominal Contraction    Tuck in stomach muscles and push low back against back of chair. Hold 5 seconds while counting out loud. Repeat 10 times. Do 2 sessions per day.  http://gt2.exer.us/624   Copyright  VHI. All rights reserved. .  Isometric Abdominal    Lying on back with knees bent, tighten stomach by pressing elbows down. Hold 5seconds. Repeat 10 times per set. Do 2 sets per day.  http://orth.exer.us/1087   Copyright  VHI. All rights reserved.   Lower Trunk Rotation Stretch    Keeping back flat and feet together, rotate knees to left side. Hold 10 seconds. Repeat 5 times per set. Do 1-2 sets per day.  http://orth.exer.us/123   Copyright  VHI. All rights reserved.   Knee to Chest (Flexion)    Pull knee toward chest. Feel stretch in lower back or buttock area. Breathing deeply, Hold 30 seconds. Repeat with other knee. Repeat 3 times. Do 2 sessions per day.  http://gt2.exer.us/226   Copyright  VHI. All rights reserved.

## 2020-08-28 NOTE — Therapy (Signed)
April [place], [place], [address] Phone: [phone]   Fax:  [phone]  Physical Therapy Treatment  Patient Details  Name: April [place]ala MRN: [id number] Date of Birth: 1975/07/24 Referring Provider (PT): Melina Schools MD   Encounter Date: 08/28/2020   PT End of Session - 08/28/20 1634    Visit Number 2    Number of Visits 12    Date for PT Re-Evaluation 10/07/20    Authorization Type BCBS - VL 20, secondary healthy blue medicaid (no auth needed as secondary)    Authorization - Visit Number 2    Authorization - Number of Visits 20    Progress Note Due on Visit 10    PT Start Time 1630    PT Stop Time 1708    PT Time Calculation (min) 38 min    Activity Tolerance Patient tolerated treatment well;No increased pain   No LBP, Lt knee pain scale stayed at 4/10 through session.   Behavior During Therapy [facility] for tasks assessed/performed           Past Medical History:  Diagnosis Date  . Anxiety   . Cervical cancer ([place]) 04/2011   Stage 1B squamous cell  . Chronic left shoulder pain 10/07/2017  . Chronic tension-type headache, not intractable 10/07/2017  . Class 2 obesity 09/29/2019  . Depression   . High cholesterol   . History of kidney stones    passed  . Prehypertension 06/26/2020  . S/P lumbar fusion 04/20/2018    Past Surgical History:  Procedure Laterality Date  . ABDOMINAL HYSTERECTOMY    . BREAST BIOPSY Right   . CERVICAL CONIZATION W/BX  03/24/2012   Procedure: CONIZATION CERVIX WITH BIOPSY;  Surgeon: April Mode, MD;  Location: [facility];  Service: Gynecology;  Laterality: N/A;  . DIAGNOSTIC LAPAROSCOPY  2007   ectopic  . ECTOPIC PREGNANCY SURGERY    . ESSURE TUBAL LIGATION  2010  . INSERTION OF SUPRAPUBIC CATHETER  05/03/2012   Procedure: INSERTION OF SUPRAPUBIC CATHETER;  Surgeon: April Chapel, MD;  Location: [facility];  Service: Gynecology;;  . LEEP    . LUMBAR  LAMINECTOMY/DECOMPRESSION MICRODISCECTOMY N/A 04/20/2018   Procedure: LUMBAR FIVE TO SACRAL ONE DECOMPRESSION AND FUSION, POSSIBLE LUMBAR FOUR TO SACRAL ONE; SCREW AND CAGE INSTRUMENTATION;  Surgeon: Melina Schools, MD;  Location: [facility];  Service: Orthopedics;  Laterality: N/A;  5 hrs  . LYMPHADENECTOMY  05/03/2012   Procedure: LYMPHADENECTOMY;  Surgeon: April Chapel, MD;  Location: [facility];  Service: Gynecology;  Laterality: Bilateral;  pelvic  . RADICAL HYSTERECTOMY  05/03/2012   Procedure: RADICAL HYSTERECTOMY;  Surgeon: April Chapel, MD;  Location: [facility];  Service: Gynecology;;  transposition of the ovaries  . SPINE SURGERY    . TUBAL LIGATION      There were no vitals filed for this visit.   Subjective Assessment - 08/28/20 1632    Subjective Pt stated she is feeling good today, didn't work today.  Reports some tenderness on Rt hip with touch but no real pain currently.  Has began the HEP.  Lt knee constant pain scale 4/10.    Pertinent History lumbar fusion 2019    Currently in Pain? Yes    Pain Score 4    LBP 0/10; Lt Knee pain 4/10   Pain Location Knee    Pain Orientation Left    Pain Descriptors / Indicators Aching;Sore    Pain Type Chronic pain  [place] Adult PT Treatment/Exercise - 08/28/20 0001      Knee/Hip Exercises: Stretches   Other Knee/Hip Stretches LTR 5x 10"; SKTC 3x 30"    Other Knee/Hip Stretches Lateral weight shifting; rotation; Lumbar extension      Knee/Hip Exercises: Supine   Hip Adduction Isometric Limitations Isometric abduction wiht belt 10x 5"    Bridges 10 reps   5" holds   Bridges Limitations decreased range    Other Supine Knee/Hip Exercises Deep breathing x 45min    Other Supine Knee/Hip Exercises Abdominal sets 3" holds paired wiht exhalation x 2 min                  PT Education - 08/28/20 1641    Education Details Reviewed goals, educated importance of HEP compliance  for maximal beneftits, pt able to recall and demonstrate.  Educated on deep breathing and paired wiht abdominal sets.    Person(s) Educated Patient;Child(ren)   daughter   Methods Explanation;Demonstration;Verbal cues;Handout    Comprehension Verbalized understanding;Returned demonstration            PT Short Term Goals - 08/26/20 1654      PT SHORT TERM GOAL #1   Title Patient will be independent in self management strategies to improve quality of life and functional outcomes.    Time 3    Period Weeks    Status New    Target Date 09/16/20      PT SHORT TERM GOAL #2   Title Patient will be able to lift right leg through full ROM without severe pain to demonstrate improved active hip extension    Time 3    Period Weeks    Status New    Target Date 09/16/20      PT SHORT TERM GOAL #3   Title Patient will report at least 25% improvement in overall symptoms and/or function to demonstrate improved functional mobility    Time 3    Period Weeks    Status New    Target Date 09/16/20             PT Long Term Goals - 08/26/20 1655      PT LONG TERM GOAL #1   Title Patient will report at least 50% improvement in overall symptoms and/or function to demonstrate improved functional mobility    Baseline Patient will be able to walk for at least 5 minutes without increase in base line lumbar pain    Time 6    Period Weeks    Status New    Target Date 10/07/20      PT LONG TERM GOAL #2   Title Patient will score with <20% disability on oswestry disability index    Time 6    Period Weeks    Status New    Target Date 10/07/20      PT LONG TERM GOAL #3   Title Patient will be able to stand and walk for at least 10 minutes without increase in baseline pain to improve funcitonal walking endurance    Time 6    Period Weeks    Status New    Target Date 10/07/20                 Plan - 08/28/20 1700    Clinical Impression Statement Reviewed goals, educated importance of  HEP compliance for maximal benefits, pt able to recall and demonstrate.  Session focus wiht lumbar mobility and proximal strengthening.  Began with deep breathing  then paired isometric abdominal sets with exhalation to reduce holding breath.  Added stretches for mobility.  Pt able to complete all exercises with no reports of increased LBP, did have some knee pain throught session, no reports of increased pain.  Additional exercises added to HEP.    Personal Factors and Comorbidities Comorbidity 1    Comorbidities lumbar fusion in 2019    Examination-Activity Limitations Squat;Sleep;Stand;Lift;Locomotion Level;Bend    Examination-Participation Restrictions Occupation;Meal Prep;Shop    Stability/Clinical Decision Making Stable/Uncomplicated    Clinical Decision Making Low    Rehab Potential Fair    PT Frequency 2x / week    PT Duration 6 weeks    PT Treatment/Interventions ADLs/Self Care Home Management;Aquatic Therapy;Cryotherapy;Electrical Stimulation;Moist Heat;Traction;Balance training;Therapeutic exercise;Therapeutic activities;Functional mobility training;Stair training;Gait training;DME Instruction;Neuromuscular re-education;Patient/family education;Manual techniques;Dry needling    PT Next Visit Plan Next session begin prone, heel squeeze, possible POE.  Aquatics, isometrics, lumbar and hip ROM, core strengthening    PT Home Exercise Plan glute squeezes; 08/28/20: abdominal sets, LTR, SKTC    Consulted and Agree with Plan of Care Patient;Family member/caregiver    Family Member Consulted Daughter sat through session           Patient will benefit from skilled therapeutic intervention in order to improve the following deficits and impairments:  Pain,Decreased strength,Decreased activity tolerance,Decreased mobility,Hypomobility  Visit Diagnosis: Muscle weakness (generalized)  Chronic pain of left knee  Chronic right-sided low back pain with right-sided sciatica     Problem  List Patient Active Problem List   Diagnosis Date Noted  . Angular cheilitis 07/17/2020  . Encounter for support and coordination of transition of care 07/09/2020  . BOOP (bronchiolitis obliterans with organizing pneumonia) ([place]) 07/09/2020  . Hypokalemia 06/30/2020  . Hyperlipidemia 06/30/2020  . Anxiety with depression 06/30/2020  . Respiratory failure with hypoxia ([place]) 06/30/2020  . Prehypertension 06/26/2020  . Facial pain, acute 06/05/2020  . Acute low back pain 05/21/2020  . Tachycardia determined by examination of pulse 05/17/2020  . Soreness of tongue 04/23/2020  . Cold sore 04/11/2020  . Pain in joint of right elbow 03/20/2020  . Right tennis elbow 02/21/2020  . Cough with expectoration 02/14/2020  . Vaginal Pap smear following hysterectomy for malignancy 12/18/2019  . Screening for colorectal cancer 12/18/2019  . RLQ abdominal pain 12/18/2019  . History of cervical cancer 10/27/2019  . Environmental and seasonal allergies 09/29/2019  . Nicotine dependence 09/29/2019  . Binge eating 09/29/2019  . Migraine 09/29/2019  . Bilateral carpal tunnel syndrome 09/29/2019  . Encounter for screening mammogram for malignant neoplasm of breast 09/29/2019  . Class 2 obesity 09/29/2019  . Family history of hyperlipidemia 09/29/2019  . Family history of diabetes mellitus 09/29/2019  . Family history of hypothyroidism 09/29/2019  . Other fatigue 09/29/2019  . Vitamin D deficiency 09/29/2019   Ihor Austin, LPTA/CLT; CBIS [phone]  Aldona Lento 08/28/2020, 6:00 PM  [place] [address] [place], [place], [address] Phone: [phone]   Fax:  [phone]  Name: MARCELL PFEIFER MRN: [id number] Date of Birth: Nov 24, 1975

## 2020-09-01 DIAGNOSIS — J84116 Cryptogenic organizing pneumonia: Secondary | ICD-10-CM | POA: Diagnosis not present

## 2020-09-01 DIAGNOSIS — J9691 Respiratory failure, unspecified with hypoxia: Secondary | ICD-10-CM | POA: Diagnosis not present

## 2020-09-02 ENCOUNTER — Ambulatory Visit (HOSPITAL_COMMUNITY): Payer: BC Managed Care – PPO | Admitting: Physical Therapy

## 2020-09-02 ENCOUNTER — Encounter (HOSPITAL_COMMUNITY): Payer: Self-pay | Admitting: Physical Therapy

## 2020-09-02 ENCOUNTER — Other Ambulatory Visit: Payer: Self-pay

## 2020-09-02 DIAGNOSIS — M25562 Pain in left knee: Secondary | ICD-10-CM

## 2020-09-02 DIAGNOSIS — M6281 Muscle weakness (generalized): Secondary | ICD-10-CM

## 2020-09-02 DIAGNOSIS — M5441 Lumbago with sciatica, right side: Secondary | ICD-10-CM | POA: Diagnosis not present

## 2020-09-02 DIAGNOSIS — G8929 Other chronic pain: Secondary | ICD-10-CM

## 2020-09-02 NOTE — Therapy (Signed)
[place], [place], [address] Phone: [phone]   Fax:  [phone]  Physical Therapy Treatment  Patient Details  Name: April Ayala MRN: [id number] Date of Birth: 11/10/1975 Referring Provider (PT): Melina Schools MD   Encounter Date: 09/02/2020   PT End of Session - 09/02/20 1750    Visit Number 3    Number of Visits 12    Date for PT Re-Evaluation 10/07/20    Authorization Type BCBS - VL 20, secondary healthy blue medicaid (no auth needed as secondary)    Authorization - Visit Number 3    Authorization - Number of Visits 20    Progress Note Due on Visit 10    PT Start Time 1455    PT Stop Time 1540    PT Time Calculation (min) 45 min    Activity Tolerance Patient tolerated treatment well    Behavior During Therapy [facility] for tasks assessed/performed           Past Medical History:  Diagnosis Date  . Anxiety   . Cervical cancer ([place]) 04/2011   Stage 1B squamous cell  . Chronic left shoulder pain 10/07/2017  . Chronic tension-type headache, not intractable 10/07/2017  . Class 2 obesity 09/29/2019  . Depression   . High cholesterol   . History of kidney stones    passed  . Prehypertension 06/26/2020  . S/P lumbar fusion 04/20/2018    Past Surgical History:  Procedure Laterality Date  . ABDOMINAL HYSTERECTOMY    . BREAST BIOPSY Right   . CERVICAL CONIZATION W/BX  03/24/2012   Procedure: CONIZATION CERVIX WITH BIOPSY;  Surgeon: Wood[place] Mode, MD;  Location: [facility];  Service: Gynecology;  Laterality: N/A;  . DIAGNOSTIC LAPAROSCOPY  2007   ectopic  . ECTOPIC PREGNANCY SURGERY    . ESSURE TUBAL LIGATION  2010  . INSERTION OF SUPRAPUBIC CATHETER  05/03/2012   Procedure: INSERTION OF SUPRAPUBIC CATHETER;  Surgeon: Alvino Chapel, MD;  Location: [facility];  Service: Gynecology;;  . LEEP    . LUMBAR LAMINECTOMY/DECOMPRESSION MICRODISCECTOMY N/A 04/20/2018   Procedure: LUMBAR FIVE TO SACRAL ONE  DECOMPRESSION AND FUSION, POSSIBLE LUMBAR FOUR TO SACRAL ONE; SCREW AND CAGE INSTRUMENTATION;  Surgeon: Melina Schools, MD;  Location: [facility];  Service: Orthopedics;  Laterality: N/A;  5 hrs  . LYMPHADENECTOMY  05/03/2012   Procedure: LYMPHADENECTOMY;  Surgeon: Alvino Chapel, MD;  Location: [facility];  Service: Gynecology;  Laterality: Bilateral;  pelvic  . RADICAL HYSTERECTOMY  05/03/2012   Procedure: RADICAL HYSTERECTOMY;  Surgeon: Alvino Chapel, MD;  Location: [facility];  Service: Gynecology;;  transposition of the ovaries  . SPINE SURGERY    . TUBAL LIGATION      There were no vitals filed for this visit.   Subjective Assessment - 09/02/20 1748    Subjective Patient reports compliance with HEP and feels this has been helping wiht back but still has some ongoing pain in hip. Back is about a 6/10 today.    Pertinent History lumbar fusion 2019    Currently in Pain? Yes    Pain Score 6     Pain Location Back    Pain Orientation Lower    Pain Descriptors / Indicators Aching    Pain Type Chronic pain    Pain Onset More than a month ago    Pain Frequency Constant  Adult Aquatic Therapy - 09/02/20 1754      Treatment   Exercises Dynamic warmup: pool walking, sidestepping retro walking 3RT each, heel raises 2 x 10, mini squats 2 x10, hip abduction 2 x 10, hip extension 2 x 10, ab setting 10 x 5", single arm dumbbell pull down 2 x 10, double arm dumbbell pull down 2 x 10                        PT Short Term Goals - 08/26/20 1654      PT SHORT TERM GOAL #1   Title Patient will be independent in self management strategies to improve quality of life and functional outcomes.    Time 3    Period Weeks    Status New    Target Date 09/16/20      PT SHORT TERM GOAL #2   Title Patient will be able to lift right leg through full ROM without severe pain to demonstrate improved active hip extension    Time 3    Period Weeks     Status New    Target Date 09/16/20      PT SHORT TERM GOAL #3   Title Patient will report at least 25% improvement in overall symptoms and/or function to demonstrate improved functional mobility    Time 3    Period Weeks    Status New    Target Date 09/16/20             PT Long Term Goals - 08/26/20 1655      PT LONG TERM GOAL #1   Title Patient will report at least 50% improvement in overall symptoms and/or function to demonstrate improved functional mobility    Baseline Patient will be able to walk for at least 5 minutes without increase in base line lumbar pain    Time 6    Period Weeks    Status New    Target Date 10/07/20      PT LONG TERM GOAL #2   Title Patient will score with <20% disability on oswestry disability index    Time 6    Period Weeks    Status New    Target Date 10/07/20      PT LONG TERM GOAL #3   Title Patient will be able to stand and walk for at least 10 minutes without increase in baseline pain to improve funcitonal walking endurance    Time 6    Period Weeks    Status New    Target Date 10/07/20                 Plan - 09/02/20 1751    Clinical Impression Statement Patient tolerated session well today with no increased complaint of pain. Initiated aquatic therapy. Patient educated on purpose and function of all added exercise. Focused on general mobility, hip and core strengthening, and posturing. Patient cued on TA activation during ab setting and dumbbell pull down. Patient also cued on body mechanics with mini squatting. Patient will continue to benefit from skilled therapy services to progress hip and core strengthening for decreased pain and improved functional mobility.    Personal Factors and Comorbidities Comorbidity 1    Comorbidities lumbar fusion in 2019    Examination-Activity Limitations Squat;Sleep;Stand;Lift;Locomotion Level;Bend    Examination-Participation Restrictions Occupation;Meal Prep;Shop    Stability/Clinical  Decision Making Stable/Uncomplicated    Rehab Potential Fair    PT Frequency 2x / week  PT Duration 6 weeks    PT Treatment/Interventions ADLs/Self Care Home Management;Aquatic Therapy;Cryotherapy;Electrical Stimulation;Moist Heat;Traction;Balance training;Therapeutic exercise;Therapeutic activities;Functional mobility training;Stair training;Gait training;DME Instruction;Neuromuscular re-education;Patient/family education;Manual techniques;Dry needling    PT Next Visit Plan Next session begin prone, heel squeeze, possible POE.  Aquatics, isometrics, lumbar and hip ROM, core strengthening    PT Home Exercise Plan glute squeezes; 08/28/20: abdominal sets, LTR, SKTC    Consulted and Agree with Plan of Care Patient;Family member/caregiver           Patient will benefit from skilled therapeutic intervention in order to improve the following deficits and impairments:  Pain,Decreased strength,Decreased activity tolerance,Decreased mobility,Hypomobility  Visit Diagnosis: Muscle weakness (generalized)  Chronic pain of left knee  Chronic right-sided low back pain with right-sided sciatica     Problem List Patient Active Problem List   Diagnosis Date Noted  . Angular cheilitis 07/17/2020  . Encounter for support and coordination of transition of care 07/09/2020  . BOOP (bronchiolitis obliterans with organizing pneumonia) ([place]) 07/09/2020  . Hypokalemia 06/30/2020  . Hyperlipidemia 06/30/2020  . Anxiety with depression 06/30/2020  . Respiratory failure with hypoxia ([place]) 06/30/2020  . Prehypertension 06/26/2020  . Facial pain, acute 06/05/2020  . Acute low back pain 05/21/2020  . Tachycardia determined by examination of pulse 05/17/2020  . Soreness of tongue 04/23/2020  . Cold sore 04/11/2020  . Pain in joint of right elbow 03/20/2020  . Right tennis elbow 02/21/2020  . Cough with expectoration 02/14/2020  . Vaginal Pap smear following hysterectomy for malignancy 12/18/2019  .  Screening for colorectal cancer 12/18/2019  . RLQ abdominal pain 12/18/2019  . History of cervical cancer 10/27/2019  . Environmental and seasonal allergies 09/29/2019  . Nicotine dependence 09/29/2019  . Binge eating 09/29/2019  . Migraine 09/29/2019  . Bilateral carpal tunnel syndrome 09/29/2019  . Encounter for screening mammogram for malignant neoplasm of breast 09/29/2019  . Class 2 obesity 09/29/2019  . Family history of hyperlipidemia 09/29/2019  . Family history of diabetes mellitus 09/29/2019  . Family history of hypothyroidism 09/29/2019  . Other fatigue 09/29/2019  . Vitamin D deficiency 09/29/2019    5:57 PM, 09/02/20 Josue Hector PT DPT  Physical Therapist with [facility]  [phone] [place] [address] [place], [place], [address] Phone: [phone]   Fax:  [phone]  Name: MAVA SUARES MRN: [id number] Date of Birth: 1976-06-18

## 2020-09-03 ENCOUNTER — Ambulatory Visit (HOSPITAL_COMMUNITY): Payer: BC Managed Care – PPO | Admitting: Physical Therapy

## 2020-09-04 ENCOUNTER — Encounter (HOSPITAL_COMMUNITY): Payer: Self-pay | Admitting: Physical Therapy

## 2020-09-04 ENCOUNTER — Other Ambulatory Visit: Payer: Self-pay

## 2020-09-04 ENCOUNTER — Ambulatory Visit (HOSPITAL_COMMUNITY): Payer: BC Managed Care – PPO | Attending: Orthopedic Surgery | Admitting: Physical Therapy

## 2020-09-04 DIAGNOSIS — G8929 Other chronic pain: Secondary | ICD-10-CM | POA: Insufficient documentation

## 2020-09-04 DIAGNOSIS — M25562 Pain in left knee: Secondary | ICD-10-CM | POA: Diagnosis present

## 2020-09-04 DIAGNOSIS — M6281 Muscle weakness (generalized): Secondary | ICD-10-CM

## 2020-09-04 DIAGNOSIS — M5441 Lumbago with sciatica, right side: Secondary | ICD-10-CM | POA: Insufficient documentation

## 2020-09-04 NOTE — Therapy (Signed)
[place], [place], [address] Phone: [phone]   Fax:  [phone]  Physical Therapy Treatment  Patient Details  Name: April Ayala MRN: [id number] Date of Birth: 09-06-1975 Referring Provider (PT): Melina Schools MD   Encounter Date: 09/04/2020   PT End of Session - 09/04/20 1555    Visit Number 4    Number of Visits 12    Date for PT Re-Evaluation 10/07/20    Authorization Type BCBS - VL 20, secondary healthy blue medicaid (no auth needed as secondary)    Authorization - Visit Number 4    Authorization - Number of Visits 20    Progress Note Due on Visit 10    PT Start Time 1555    PT Stop Time 1635    PT Time Calculation (min) 40 min    Activity Tolerance Patient tolerated treatment well    Behavior During Therapy [facility] for tasks assessed/performed           Past Medical History:  Diagnosis Date  . Anxiety   . Cervical cancer ([place]) 04/2011   Stage 1B squamous cell  . Chronic left shoulder pain 10/07/2017  . Chronic tension-type headache, not intractable 10/07/2017  . Class 2 obesity 09/29/2019  . Depression   . High cholesterol   . History of kidney stones    passed  . Prehypertension 06/26/2020  . S/P lumbar fusion 04/20/2018    Past Surgical History:  Procedure Laterality Date  . ABDOMINAL HYSTERECTOMY    . BREAST BIOPSY Right   . CERVICAL CONIZATION W/BX  03/24/2012   Procedure: CONIZATION CERVIX WITH BIOPSY;  Surgeon: Woodroe Mode, MD;  Location: [facility];  Service: Gynecology;  Laterality: N/A;  . DIAGNOSTIC LAPAROSCOPY  2007   ectopic  . ECTOPIC PREGNANCY SURGERY    . ESSURE TUBAL LIGATION  2010  . INSERTION OF SUPRAPUBIC CATHETER  05/03/2012   Procedure: INSERTION OF SUPRAPUBIC CATHETER;  Surgeon: Alvino Chapel, MD;  Location: [facility];  Service: Gynecology;;  . LEEP    . LUMBAR LAMINECTOMY/DECOMPRESSION MICRODISCECTOMY N/A 04/20/2018   Procedure: LUMBAR FIVE TO SACRAL ONE  DECOMPRESSION AND FUSION, POSSIBLE LUMBAR FOUR TO SACRAL ONE; SCREW AND CAGE INSTRUMENTATION;  Surgeon: Melina Schools, MD;  Location: [facility];  Service: Orthopedics;  Laterality: N/A;  5 hrs  . LYMPHADENECTOMY  05/03/2012   Procedure: LYMPHADENECTOMY;  Surgeon: Alvino Chapel, MD;  Location: [facility];  Service: Gynecology;  Laterality: Bilateral;  pelvic  . RADICAL HYSTERECTOMY  05/03/2012   Procedure: RADICAL HYSTERECTOMY;  Surgeon: Alvino Chapel, MD;  Location: [facility];  Service: Gynecology;;  transposition of the ovaries  . SPINE SURGERY    . TUBAL LIGATION      There were no vitals filed for this visit.   Subjective Assessment - 09/04/20 1600    Subjective States that back and hip is feeling better but knee was bothering her yesterday after the pool. States that exercises are going well.    Pertinent History lumbar fusion 2019    Currently in Pain? Yes   no back pain   Pain Score 8     Pain Location Knee    Pain Orientation Left    Pain Descriptors / Indicators Sharp    Pain Onset More than a month ago              [facility] PT Assessment - 09/04/20 0001      Assessment   Medical  Diagnosis low back pain    Referring Provider (PT) Melina Schools MD                         [facility] Adult PT Treatment/Exercise - 09/04/20 0001      Knee/Hip Exercises: Stretches   Quad Stretch Left;4 reps;30 seconds   thomas stretch     Knee/Hip Exercises: Seated   Sit to [employer] --      Knee/Hip Exercises: Supine   Short [employer] AROM;15 reps;2 sets;Left   small bolster   Bridges 3 sets;5 reps;Strengthening    Bridges Limitations decreased range    Other Supine Knee/Hip Exercises dead bugs x15 5" holds isometric hold - cues to breath and to bring knees up into chest - fatigue noted afterwards    Other Supine Knee/Hip Exercises hip add isometrics x20 5" holds with ball; butterfly stretch x15 5" holds      Knee/Hip Exercises: Prone   Straight Leg Raises 4 sets;5  reps;Both    Other Prone Exercises prone on elbows - x10 5" holds                  PT Education - 09/04/20 1614    Education Details Educated patient on HEP, importance of rest breaks and how fatigue will alter form and increase pain    Person(s) Educated Patient    Methods Explanation;Handout    Comprehension Verbalized understanding            PT Short Term Goals - 08/26/20 1654      PT SHORT TERM GOAL #1   Title Patient will be independent in self management strategies to improve quality of life and functional outcomes.    Time 3    Period Weeks    Status New    Target Date 09/16/20      PT SHORT TERM GOAL #2   Title Patient will be able to lift right leg through full ROM without severe pain to demonstrate improved active hip extension    Time 3    Period Weeks    Status New    Target Date 09/16/20      PT SHORT TERM GOAL #3   Title Patient will report at least 25% improvement in overall symptoms and/or function to demonstrate improved functional mobility    Time 3    Period Weeks    Status New    Target Date 09/16/20             PT Long Term Goals - 08/26/20 1655      PT LONG TERM GOAL #1   Title Patient will report at least 50% improvement in overall symptoms and/or function to demonstrate improved functional mobility    Baseline Patient will be able to walk for at least 5 minutes without increase in base line lumbar pain    Time 6    Period Weeks    Status New    Target Date 10/07/20      PT LONG TERM GOAL #2   Title Patient will score with <20% disability on oswestry disability index    Time 6    Period Weeks    Status New    Target Date 10/07/20      PT LONG TERM GOAL #3   Title Patient will be able to stand and walk for at least 10 minutes without increase in baseline pain to improve funcitonal walking endurance    Time 6  Period Weeks    Status New    Target Date 10/07/20                 Plan - 09/04/20 1612    Clinical  Impression Statement Overall patient tolerated session very well. Fatigue noted but no reports of low back pain. Unable to perform prone heel press or hamstring curls secondary to increased left knee pain with knee flexion. Added new exercises to HEP. Will continue with current POC as tolerated.    Personal Factors and Comorbidities Comorbidity 1    Comorbidities lumbar fusion in 2019    Examination-Activity Limitations Squat;Sleep;Stand;Lift;Locomotion Level;Bend    Examination-Participation Restrictions Occupation;Meal Prep;Shop    Stability/Clinical Decision Making Stable/Uncomplicated    Rehab Potential Fair    PT Frequency 2x / week    PT Duration 6 weeks    PT Treatment/Interventions ADLs/Self Care Home Management;Aquatic Therapy;Cryotherapy;Electrical Stimulation;Moist Heat;Traction;Balance training;Therapeutic exercise;Therapeutic activities;Functional mobility training;Stair training;Gait training;DME Instruction;Neuromuscular re-education;Patient/family education;Manual techniques;Dry needling    PT Next Visit Plan Aquatics, isometrics, lumbar and hip ROM, core and LE strenthening    PT Home Exercise Plan glute squeezes; 08/28/20: abdominal sets, LTR, SKTC; 3/2 POE, SAQs, hip extension, hip add isometric    Consulted and Agree with Plan of Care Patient;Family member/caregiver           Patient will benefit from skilled therapeutic intervention in order to improve the following deficits and impairments:  Pain,Decreased strength,Decreased activity tolerance,Decreased mobility,Hypomobility  Visit Diagnosis: Muscle weakness (generalized)  Chronic right-sided low back pain with right-sided sciatica  Chronic pain of left knee     Problem List Patient Active Problem List   Diagnosis Date Noted  . Angular cheilitis 07/17/2020  . Encounter for support and coordination of transition of care 07/09/2020  . BOOP (bronchiolitis obliterans with organizing pneumonia) ([place]) 07/09/2020  .  Hypokalemia 06/30/2020  . Hyperlipidemia 06/30/2020  . Anxiety with depression 06/30/2020  . Respiratory failure with hypoxia ([place]) 06/30/2020  . Prehypertension 06/26/2020  . Facial pain, acute 06/05/2020  . Acute low back pain 05/21/2020  . Tachycardia determined by examination of pulse 05/17/2020  . Soreness of tongue 04/23/2020  . Cold sore 04/11/2020  . Pain in joint of right elbow 03/20/2020  . Right tennis elbow 02/21/2020  . Cough with expectoration 02/14/2020  . Vaginal Pap smear following hysterectomy for malignancy 12/18/2019  . Screening for colorectal cancer 12/18/2019  . RLQ abdominal pain 12/18/2019  . History of cervical cancer 10/27/2019  . Environmental and seasonal allergies 09/29/2019  . Nicotine dependence 09/29/2019  . Binge eating 09/29/2019  . Migraine 09/29/2019  . Bilateral carpal tunnel syndrome 09/29/2019  . Encounter for screening mammogram for malignant neoplasm of breast 09/29/2019  . Class 2 obesity 09/29/2019  . Family history of hyperlipidemia 09/29/2019  . Family history of diabetes mellitus 09/29/2019  . Family history of hypothyroidism 09/29/2019  . Other fatigue 09/29/2019  . Vitamin D deficiency 09/29/2019    4:37 PM, 09/04/20 Jerene Pitch, DPT Physical Therapy with [facility]  [phone] office  [place] [address] [place], [place], [address] Phone: [phone]   Fax:  [phone]  Name: KALONI BISAILLON MRN: [id number] Date of Birth: 05/17/1976

## 2020-09-04 NOTE — Patient Instructions (Signed)
Access Code: [email or web address] URL: [email or web address] Date: 09/04/2020 Prepared by: Yetta Glassman  Exercises Prone Hip Extension - 4 sets - 5 reps Prone Press Up On Elbows - 2 sets - 10 reps Supine Knee Extension Strengthening - 4 sets - 5 reps Supine Hip Adduction Isometric with Ball - 20 reps - 5 hold

## 2020-09-09 ENCOUNTER — Ambulatory Visit (HOSPITAL_COMMUNITY): Payer: BC Managed Care – PPO | Admitting: Physical Therapy

## 2020-09-09 ENCOUNTER — Encounter (HOSPITAL_COMMUNITY): Payer: BC Managed Care – PPO

## 2020-09-09 ENCOUNTER — Other Ambulatory Visit: Payer: Self-pay

## 2020-09-09 ENCOUNTER — Encounter (HOSPITAL_COMMUNITY): Payer: Self-pay | Admitting: Physical Therapy

## 2020-09-09 DIAGNOSIS — M6281 Muscle weakness (generalized): Secondary | ICD-10-CM

## 2020-09-09 DIAGNOSIS — G8929 Other chronic pain: Secondary | ICD-10-CM

## 2020-09-09 DIAGNOSIS — M25562 Pain in left knee: Secondary | ICD-10-CM

## 2020-09-09 NOTE — Therapy (Signed)
[place], [place], [address] Phone: [phone]   Fax:  [phone]  Physical Therapy Treatment  Patient Details  Name: April Ayala MRN: [id number] Date of Birth: [birth year] Referring Provider (PT): Melina Schools MD   Encounter Date: 09/09/2020   PT End of Session - 09/09/20 1728    Visit Number 5    Number of Visits 12    Date for PT Re-Evaluation 10/07/20    Authorization Type BCBS - VL 20, secondary healthy blue medicaid (no auth needed as secondary)    Authorization - Visit Number 5    Authorization - Number of Visits 20    Progress Note Due on Visit 10    PT Start Time 3532    PT Stop Time 1545    PT Time Calculation (min) 47 min    Activity Tolerance Patient tolerated treatment well    Behavior During Therapy [facility] for tasks assessed/performed           Past Medical History:  Diagnosis Date  . Anxiety   . Cervical cancer ([place]) 04/2011   Stage 1B squamous cell  . Chronic left shoulder pain 10/07/2017  . Chronic tension-type headache, not intractable 10/07/2017  . Class 2 obesity 09/29/2019  . Depression   . High cholesterol   . History of kidney stones    passed  . Prehypertension 06/26/2020  . S/P lumbar fusion 04/20/2018    Past Surgical History:  Procedure Laterality Date  . ABDOMINAL HYSTERECTOMY    . BREAST BIOPSY Right   . CERVICAL CONIZATION W/BX  03/24/2012   Procedure: CONIZATION CERVIX WITH BIOPSY;  Surgeon: Woodroe Mode, MD;  Location: [facility];  Service: Gynecology;  Laterality: N/A;  . DIAGNOSTIC LAPAROSCOPY  2007   ectopic  . ECTOPIC PREGNANCY SURGERY    . ESSURE TUBAL LIGATION  2010  . INSERTION OF SUPRAPUBIC CATHETER  05/03/2012   Procedure: INSERTION OF SUPRAPUBIC CATHETER;  Surgeon: Alvino Chapel, MD;  Location: [facility];  Service: Gynecology;;  . LEEP    . LUMBAR LAMINECTOMY/DECOMPRESSION MICRODISCECTOMY N/A 04/20/2018   Procedure: LUMBAR FIVE TO SACRAL ONE  DECOMPRESSION AND FUSION, POSSIBLE LUMBAR FOUR TO SACRAL ONE; SCREW AND CAGE INSTRUMENTATION;  Surgeon: Melina Schools, MD;  Location: [facility];  Service: Orthopedics;  Laterality: N/A;  5 hrs  . LYMPHADENECTOMY  05/03/2012   Procedure: LYMPHADENECTOMY;  Surgeon: Alvino Chapel, MD;  Location: [facility];  Service: Gynecology;  Laterality: Bilateral;  pelvic  . RADICAL HYSTERECTOMY  05/03/2012   Procedure: RADICAL HYSTERECTOMY;  Surgeon: Alvino Chapel, MD;  Location: [facility];  Service: Gynecology;;  transposition of the ovaries  . SPINE SURGERY    . TUBAL LIGATION      There were no vitals filed for this visit.   Subjective Assessment - 09/09/20 1727    Subjective Patient says her back is better, her hip still bothers her a little, but her LT knee has been hurting more the past few days wiht lifting at work.    Pertinent History lumbar fusion 2019    Currently in Pain? Yes    Pain Score 6     Pain Location Knee    Pain Orientation Left    Pain Descriptors / Indicators Sharp    Pain Type Chronic pain    Pain Onset More than a month ago    Pain Frequency Constant  Adult Aquatic Therapy - 09/09/20 1735      Treatment   Exercises Dynamic warmup: pool walking, sidestepping retro walking 3RT each, calf stretch 3 x 20", lunge stretching 5 x 10", single leg balance 3 x 15", heel raises 2 x 10, mini squats 2 x10, ab setting 10 x 5", single arm dumbbell pull down x 10, double arm dumbbell pull down x 10, step ups x10                        PT Short Term Goals - 08/26/20 1654      PT SHORT TERM GOAL #1   Title Patient will be independent in self management strategies to improve quality of life and functional outcomes.    Time 3    Period Weeks    Status New    Target Date 09/16/20      PT SHORT TERM GOAL #2   Title Patient will be able to lift right leg through full ROM without severe pain to demonstrate improved active hip  extension    Time 3    Period Weeks    Status New    Target Date 09/16/20      PT SHORT TERM GOAL #3   Title Patient will report at least 25% improvement in overall symptoms and/or function to demonstrate improved functional mobility    Time 3    Period Weeks    Status New    Target Date 09/16/20             PT Long Term Goals - 08/26/20 1655      PT LONG TERM GOAL #1   Title Patient will report at least 50% improvement in overall symptoms and/or function to demonstrate improved functional mobility    Baseline Patient will be able to walk for at least 5 minutes without increase in base line lumbar pain    Time 6    Period Weeks    Status New    Target Date 10/07/20      PT LONG TERM GOAL #2   Title Patient will score with <20% disability on oswestry disability index    Time 6    Period Weeks    Status New    Target Date 10/07/20      PT LONG TERM GOAL #3   Title Patient will be able to stand and walk for at least 10 minutes without increase in baseline pain to improve funcitonal walking endurance    Time 6    Period Weeks    Status New    Target Date 10/07/20                 Plan - 09/09/20 1729    Clinical Impression Statement Patient tolerated session well today. Added knee and calf stretching per increased complaint of knee pain. Patient reports decreased pain with stairs afterward. Added single leg balance for core and LE stabilization. Patient cued on proper form and body mechanics with mini squatting. Patient notes slight decrease in pain post session. Patient will continue to benefit from skilled therapy services to progress core and LE strength for reduced pain and improved functional mobility.    Personal Factors and Comorbidities Comorbidity 1    Comorbidities lumbar fusion in 2019    Examination-Activity Limitations Squat;Sleep;Stand;Lift;Locomotion Level;Bend    Examination-Participation Restrictions Occupation;Meal Prep;Shop    Stability/Clinical  Decision Making Stable/Uncomplicated    Rehab Potential Fair    PT Frequency 2x /  week    PT Duration 6 weeks    PT Treatment/Interventions ADLs/Self Care Home Management;Aquatic Therapy;Cryotherapy;Electrical Stimulation;Moist Heat;Traction;Balance training;Therapeutic exercise;Therapeutic activities;Functional mobility training;Stair training;Gait training;DME Instruction;Neuromuscular re-education;Patient/family education;Manual techniques;Dry needling    PT Next Visit Plan Aquatics, isometrics, lumbar and hip ROM, core and LE strenthening    PT Home Exercise Plan glute squeezes; 08/28/20: abdominal sets, LTR, SKTC; 3/2 POE, SAQs, hip extension, hip add isometric    Consulted and Agree with Plan of Care Patient;Family member/caregiver           Patient will benefit from skilled therapeutic intervention in order to improve the following deficits and impairments:  Pain,Decreased strength,Decreased activity tolerance,Decreased mobility,Hypomobility  Visit Diagnosis: Muscle weakness (generalized)  Chronic right-sided low back pain with right-sided sciatica  Chronic pain of left knee     Problem List Patient Active Problem List   Diagnosis Date Noted  . Angular cheilitis 07/17/2020  . Encounter for support and coordination of transition of care 07/09/2020  . BOOP (bronchiolitis obliterans with organizing pneumonia) ([place]) 07/09/2020  . Hypokalemia 06/30/2020  . Hyperlipidemia 06/30/2020  . Anxiety with depression 06/30/2020  . Respiratory failure with hypoxia ([place]) 06/30/2020  . Prehypertension 06/26/2020  . Facial pain, acute 06/05/2020  . Acute low back pain 05/21/2020  . Tachycardia determined by examination of pulse 05/17/2020  . Soreness of tongue 04/23/2020  . Cold sore 04/11/2020  . Pain in joint of right elbow 03/20/2020  . Right tennis elbow 02/21/2020  . Cough with expectoration 02/14/2020  . Vaginal Pap smear following hysterectomy for malignancy 12/18/2019  .  Screening for colorectal cancer 12/18/2019  . RLQ abdominal pain 12/18/2019  . History of cervical cancer 10/27/2019  . Environmental and seasonal allergies 09/29/2019  . Nicotine dependence 09/29/2019  . Binge eating 09/29/2019  . Migraine 09/29/2019  . Bilateral carpal tunnel syndrome 09/29/2019  . Encounter for screening mammogram for malignant neoplasm of breast 09/29/2019  . Class 2 obesity 09/29/2019  . Family history of hyperlipidemia 09/29/2019  . Family history of diabetes mellitus 09/29/2019  . Family history of hypothyroidism 09/29/2019  . Other fatigue 09/29/2019  . Vitamin D deficiency 09/29/2019   5:38 PM, 09/09/20 Josue Hector PT DPT  Physical Therapist with [facility]  [phone] [place] [address] [place], [place], [address] Phone: [phone]   Fax:  [phone]  Name: April Ayala MRN: [id number] Date of Birth: 1975-10-26

## 2020-09-10 ENCOUNTER — Encounter: Payer: Self-pay | Admitting: Family Medicine

## 2020-09-10 ENCOUNTER — Ambulatory Visit: Payer: BC Managed Care – PPO | Admitting: Family Medicine

## 2020-09-10 VITALS — BP 118/80 | HR 112 | Resp 16 | Ht 63.0 in | Wt 223.0 lb

## 2020-09-10 DIAGNOSIS — E559 Vitamin D deficiency, unspecified: Secondary | ICD-10-CM

## 2020-09-10 DIAGNOSIS — E669 Obesity, unspecified: Secondary | ICD-10-CM | POA: Diagnosis not present

## 2020-09-10 DIAGNOSIS — R030 Elevated blood-pressure reading, without diagnosis of hypertension: Secondary | ICD-10-CM

## 2020-09-10 DIAGNOSIS — Z0001 Encounter for general adult medical examination with abnormal findings: Secondary | ICD-10-CM | POA: Insufficient documentation

## 2020-09-10 DIAGNOSIS — R5383 Other fatigue: Secondary | ICD-10-CM | POA: Diagnosis not present

## 2020-09-10 DIAGNOSIS — F17218 Nicotine dependence, cigarettes, with other nicotine-induced disorders: Secondary | ICD-10-CM

## 2020-09-10 DIAGNOSIS — E7841 Elevated Lipoprotein(a): Secondary | ICD-10-CM | POA: Diagnosis not present

## 2020-09-10 NOTE — Assessment & Plan Note (Signed)
Discussed monthly self breast exams and yearly mammograms; at least 30 minutes of aerobic activity at least 5 days/week and weight-bearing exercise 2x/week; proper sunscreen use reviewed; healthy diet, including goals of calcium and vitamin D intake and alcohol recommendations (less than or equal to 1 drink/day) reviewed; regular seatbelt use; changing batteries in smoke detectors.  Immunization recommendations discussed.  Colonoscopy recommendations reviewed.  

## 2020-09-10 NOTE — Progress Notes (Signed)
Health Maintenance reviewed -   Immunization History  Administered Date(s) Administered  . Influenza,inj,Quad PF,6+ Mos 05/10/2017, 02/23/2018, 03/15/2020  . Influenza,inj,quad, With Preservative 04/27/2019  . Influenza-Unspecified 04/14/2017, 02/23/2018  . PFIZER(Purple Top)SARS-COV-2 Vaccination 02/13/2020, 03/05/2020  . Pneumococcal Conjugate-13 03/16/2018  . Pneumococcal Polysaccharide-23 05/06/2012  . Tdap 07/20/2017   Last Pap smear: sees [facility] 12/2019 Last mammogram: 10/2019 Last colonoscopy: Due at 89 Last DEXA: Due at 56 Dentist: last year- no changes Ophtho: last year- needs new contacts will see them again in July Exercise: limited due to ortho trouble Smoker: yes Alcohol Use: some  Other doctors caring for patient include:  Patient Care Team: Perlie Mayo, NP as PCP - General (Family Medicine)  End of Life Discussion:  Patient does not have a living will and medical power of attorney  Subjective:   HPI  April Ayala is a 45 y.o. female who presents for annual comprehensive visit and follow-up on chronic medical conditions.  She has the following concerns: weight loss is her biggest issue. However she has not made diet or lifestyle changes to help this. I had her on adipex, but her HR is not very tolerant of it due to her caffeine intake. She also was not loosing weight on it- due to not changing her eating habits.   She also continues to report fatigue but is taking her vitamin D per her. Updated labs will be ordered today  She is being followed by ortho and PT for her knee and back pains.  Review Of Systems  Review of Systems  Constitutional: Positive for fatigue.  HENT: Negative.   Eyes: Negative.   Respiratory: Negative.   Cardiovascular: Negative.   Gastrointestinal: Negative.   Endocrine: Negative.   Genitourinary: Negative.   Musculoskeletal: Positive for arthralgias and back pain.  Skin: Negative.   Psychiatric/Behavioral:  Negative.     Objective:   PHYSICAL EXAM:  BP 118/80   Pulse (!) 112   Resp 16   Ht 5\' 3"  (1.6 m)   Wt 223 lb (101.2 kg)   LMP 04/11/2012   SpO2 98%   BMI 39.50 kg/m    Physical Exam Vitals and nursing note reviewed.  Constitutional:      Appearance: Normal appearance. She is obese.  HENT:     Head: Normocephalic and atraumatic.     Right Ear: Tympanic membrane, ear canal and external ear normal. There is no impacted cerumen.     Left Ear: Tympanic membrane, ear canal and external ear normal. There is no impacted cerumen.     Mouth/Throat:     Comments: Mask in place  Eyes:     General:        Right eye: No discharge.        Left eye: No discharge.     Extraocular Movements: Extraocular movements intact.     Conjunctiva/sclera: Conjunctivae normal.     Pupils: Pupils are equal, round, and reactive to light.  Neck:     Thyroid: No thyroid mass, thyromegaly or thyroid tenderness.     Vascular: No carotid bruit.  Cardiovascular:     Rate and Rhythm: Normal rate and regular rhythm.     Pulses: Normal pulses.          Radial pulses are 2+ on the right side and 2+ on the left side.       Dorsalis pedis pulses are 2+ on the right side and 2+ on the left side.  Heart sounds: Normal heart sounds.  Pulmonary:     Effort: Pulmonary effort is normal.     Breath sounds: Normal breath sounds.  Abdominal:     General: Abdomen is flat. Bowel sounds are normal.     Palpations: Abdomen is soft.     Tenderness: There is no abdominal tenderness. There is no right CVA tenderness or left CVA tenderness.  Musculoskeletal:        General: Normal range of motion.     Cervical back: Full passive range of motion without pain, normal range of motion and neck supple.     Right lower leg: No edema.     Left lower leg: No edema.     Comments: ROM intact   Lymphadenopathy:     Cervical: No cervical adenopathy.  Skin:    General: Skin is warm and dry.     Capillary Refill: Capillary  refill takes less than 2 seconds.  Neurological:     General: No focal deficit present.     Mental Status: She is alert and oriented to person, place, and time. Mental status is at baseline.     Cranial Nerves: Cranial nerves are intact.     Sensory: Sensation is intact.     Motor: Motor function is intact.     Coordination: Coordination is intact.     Gait: Gait is intact.     Deep Tendon Reflexes: Reflexes are normal and symmetric.  Psychiatric:        Attention and Perception: Attention and perception normal.        Mood and Affect: Mood and affect normal.        Speech: Speech normal.        Behavior: Behavior normal. Behavior is cooperative.        Thought Content: Thought content normal.        Cognition and Memory: Cognition and memory normal.        Judgment: Judgment normal.      Depression Screening  Depression screen [facility] 2/9 09/10/2020 07/17/2020 07/09/2020 06/26/2020 06/12/2020  Decreased Interest 0 0 0 0 0  Down, Depressed, Hopeless 0 0 0 0 0  PHQ - 2 Score 0 0 0 0 0  Altered sleeping - - - - -  Tired, decreased energy - - - - -  Change in appetite - - - - -  Feeling bad or failure about yourself  - - - - -  Trouble concentrating - - - - -  Moving slowly or fidgety/restless - - - - -  Suicidal thoughts - - - - -  PHQ-9 Score - - - - -  Difficult doing work/chores - - - - -  Some recent data might be hidden     Falls  Fall Risk  09/10/2020 07/17/2020 07/09/2020 06/26/2020 06/12/2020  Falls in the past year? 0 0 0 0 0  Number falls in past yr: 0 0 0 0 0  Injury with Fall? 0 0 0 0 0  Risk for fall due to : - No Fall Risks No Fall Risks No Fall Risks No Fall Risks  Follow up - Falls evaluation completed Falls evaluation completed Falls evaluation completed -    Assessment & Plan:   1. Annual visit for general adult medical examination with abnormal findings   2. Morbid obesity (HCC)   3. Elevated lipoprotein(a)   4. Prehypertension   5. Cigarette nicotine  dependence with other nicotine-induced disorder   6. Other fatigue  7. Vitamin D deficiency     Tests ordered Orders Placed This Encounter  Procedures  . CBC  . Comprehensive metabolic panel  . Hemoglobin A1c  . Lipid panel  . TSH  . VITAMIN D 25 Hydroxy (Vit-D Deficiency, Fractures)     Plan: Please see assessment and plan per problem list above.   No orders of the defined types were placed in this encounter.  I have personally reviewed: The patient's medical and social history Their use of alcohol, tobacco or illicit drugs Their current medications and supplements The patient's functional ability including ADLs,fall risks, home safety risks, cognitive, and hearing and visual impairment Diet and physical activities Evidence for depression or mood disorders  The patient's weight, height, BMI, and visual acuity have been recorded in the chart.  I have made referrals, counseling, and provided education to the patient based on review of the above and I have provided the patient with a written personalized care plan for preventive services.     Perlie Mayo, NP   09/10/2020

## 2020-09-10 NOTE — Assessment & Plan Note (Signed)
Continued fatigue- most likely related to deconditioning vs vitamin D Encouraged healthy eating habits, exercise and hydration with water Will check labs

## 2020-09-10 NOTE — Assessment & Plan Note (Signed)
Updated labs today ?

## 2020-09-10 NOTE — Assessment & Plan Note (Signed)
PreHTN encouraged reduction in monster drinks- she still drinks one daily and other caffeine sodas at times. Reviewed heart healthy diet with her.

## 2020-09-10 NOTE — Assessment & Plan Note (Signed)
Patches did not work per her- they will not stay on  Continues to smoke cigarettes at this time.  Asked about quitting: confirms they are currently smokes cigarettes Advise to quit smoking: Educated about QUITTING to reduce the risk of cancer, cardio and cerebrovascular disease. Assess willingness: Unwilling to quit at this time, but is working on cutting back. Assist with counseling and pharmacotherapy: Counseled for 5 minutes and literature provided. Arrange for follow up: not quitting follow up in 3 months and continue to offer help.

## 2020-09-10 NOTE — Assessment & Plan Note (Addendum)
Linked to HLD, smoking  She continues to drink non diet sodas Would like a medication to help with weight loss - but she is educated on the need for diet and exercise - lifestyle changes. She understands she will not lose weight with just medication. No additional meds provided today.  Deteriorated   Wt Readings from Last 3 Encounters:  09/10/20 223 lb (101.2 kg)  08/12/20 213 lb (96.6 kg)  07/17/20 210 lb (95.3 kg)

## 2020-09-10 NOTE — Patient Instructions (Addendum)
I appreciate the opportunity to provide you with care for your health and wellness.  Follow up: 6 months or as needed  Labs today  No referrals today  Get outside as much as possible  Exercise as your body tolerates  Please continue to practice social distancing to keep you, your family, and our community safe.  If you must go out, please wear a mask and practice good handwashing.  It was a pleasure to see you and I look forward to continuing to work together on your health and well-being. Please do not hesitate to call the office if you need care or have questions about your care.  Have a wonderful day. With Gratitude, Cherly Beach, DNP, AGNP-BC  HEALTH MAINTENANCE RECOMMENDATIONS:  It is recommended that you get at least 30 minutes of aerobic exercise at least 5 days/week (for weight loss, you may need as much as 60-90 minutes). This can be any activity that gets your heart rate up. This can be divided in 10-15 minute intervals if needed, but try and build up your endurance at least once a week.  Weight bearing exercise is also recommended twice weekly.  Eat a healthy diet with lots of vegetables, fruits and fiber.  "Colorful" foods have a lot of vitamins (ie green vegetables, tomatoes, red peppers, etc).  Limit sweet tea, regular sodas and alcoholic beverages, all of which has a lot of calories and sugar.  Up to 1 alcoholic drink daily may be beneficial for women (unless trying to lose weight, watch sugars).  Drink a lot of water.  Calcium recommendations are 1200-1500 mg daily (1500 mg for postmenopausal women or women without ovaries), and vitamin D 1000 IU daily.  This should be obtained from diet and/or supplements (vitamins), and calcium should not be taken all at once, but in divided doses.  Monthly self breast exams and yearly mammograms for women over the age of 14 is recommended.  Sunscreen of at least SPF 30 should be used on all sun-exposed parts of the skin when outside  between the hours of 10 am and 4 pm (not just when at beach or pool, but even with exercise, golf, tennis, and yard work!)  Use a sunscreen that says "broad spectrum" so it covers both UVA and UVB rays, and make sure to reapply every 1-2 hours.  Remember to change the batteries in your smoke detectors when changing your clock times in the spring and fall.  Use your seat belt every time you are in a car, and please drive safely and not be distracted with cell phones and texting while driving.

## 2020-09-10 NOTE — Assessment & Plan Note (Signed)
On crestor Updated labs ordered Encouraged heart healthy diet and exercise

## 2020-09-11 ENCOUNTER — Ambulatory Visit (HOSPITAL_COMMUNITY): Payer: BC Managed Care – PPO | Admitting: Physical Therapy

## 2020-09-11 ENCOUNTER — Other Ambulatory Visit: Payer: Self-pay

## 2020-09-11 ENCOUNTER — Encounter (HOSPITAL_COMMUNITY): Payer: Self-pay | Admitting: Physical Therapy

## 2020-09-11 DIAGNOSIS — G8929 Other chronic pain: Secondary | ICD-10-CM

## 2020-09-11 DIAGNOSIS — M6281 Muscle weakness (generalized): Secondary | ICD-10-CM

## 2020-09-11 DIAGNOSIS — M5441 Lumbago with sciatica, right side: Secondary | ICD-10-CM

## 2020-09-11 LAB — VITAMIN D 25 HYDROXY (VIT D DEFICIENCY, FRACTURES): Vit D, 25-Hydroxy: 41.3 ng/mL (ref 30.0–100.0)

## 2020-09-11 LAB — CBC
Hematocrit: 46.4 % (ref 34.0–46.6)
Hemoglobin: 15.3 g/dL (ref 11.1–15.9)
MCH: 29.5 pg (ref 26.6–33.0)
MCHC: 33 g/dL (ref 31.5–35.7)
MCV: 90 fL (ref 79–97)
Platelets: 416 10*3/uL (ref 150–450)
RBC: 5.18 x10E6/uL (ref 3.77–5.28)
RDW: 13.4 % (ref 11.7–15.4)
WBC: 11.1 10*3/uL — ABNORMAL HIGH (ref 3.4–10.8)

## 2020-09-11 LAB — COMPREHENSIVE METABOLIC PANEL
ALT: 21 IU/L (ref 0–32)
AST: 24 IU/L (ref 0–40)
Albumin/Globulin Ratio: 1.6 (ref 1.2–2.2)
Albumin: 3.9 g/dL (ref 3.8–4.8)
Alkaline Phosphatase: 89 IU/L (ref 44–121)
BUN/Creatinine Ratio: 11 (ref 9–23)
BUN: 7 mg/dL (ref 6–24)
Bilirubin Total: 0.3 mg/dL (ref 0.0–1.2)
CO2: 21 mmol/L (ref 20–29)
Calcium: 9.3 mg/dL (ref 8.7–10.2)
Chloride: 102 mmol/L (ref 96–106)
Creatinine, Ser: 0.64 mg/dL (ref 0.57–1.00)
Globulin, Total: 2.5 g/dL (ref 1.5–4.5)
Glucose: 97 mg/dL (ref 65–99)
Potassium: 4.6 mmol/L (ref 3.5–5.2)
Sodium: 140 mmol/L (ref 134–144)
Total Protein: 6.4 g/dL (ref 6.0–8.5)
eGFR: 113 mL/min/{1.73_m2} (ref >59)

## 2020-09-11 LAB — TSH: TSH: 1.22 u[IU]/mL (ref 0.450–4.500)

## 2020-09-11 LAB — HEMOGLOBIN A1C
Est. average glucose Bld gHb Est-mCnc: 103 mg/dL
Hgb A1c MFr Bld: 5.2 % (ref 4.8–5.6)

## 2020-09-11 LAB — LIPID PANEL
Chol/HDL Ratio: 2.8 ratio (ref 0.0–4.4)
Cholesterol, Total: 135 mg/dL (ref 100–199)
HDL: 49 mg/dL (ref >39)
LDL Chol Calc (NIH): 67 mg/dL (ref 0–99)
Triglycerides: 102 mg/dL (ref 0–149)
VLDL Cholesterol Cal: 19 mg/dL (ref 5–40)

## 2020-09-11 NOTE — Therapy (Signed)
[place], [place], [address] Phone: [phone]   Fax:  [phone]  Physical Therapy Treatment  Patient Details  Name: April Ayala MRN: [id number] Date of Birth: 1976/05/21 Referring Provider (PT): Melina Schools MD   Encounter Date: 09/11/2020   PT End of Session - 09/11/20 1731    Visit Number 6    Number of Visits 12    Date for PT Re-Evaluation 10/07/20    Authorization Type BCBS - VL 20, secondary healthy blue medicaid (no auth needed as secondary)    Authorization - Visit Number 6    Authorization - Number of Visits 20    Progress Note Due on Visit 10    PT Start Time 7322    PT Stop Time 1815    PT Time Calculation (min) 45 min    Activity Tolerance Patient tolerated treatment well    Behavior During Therapy [facility] for tasks assessed/performed           Past Medical History:  Diagnosis Date  . Angular cheilitis 07/17/2020  . Anxiety   . BOOP (bronchiolitis obliterans with organizing pneumonia) ([place]) 07/09/2020  . Cervical cancer ([place]) 04/2011   Stage 1B squamous cell  . Chronic left shoulder pain 10/07/2017  . Chronic tension-type headache, not intractable 10/07/2017  . Class 2 obesity 09/29/2019  . Cold sore 04/11/2020  . Depression   . Encounter for screening mammogram for malignant neoplasm of breast 09/29/2019  . Encounter for support and coordination of transition of care 07/09/2020  . Family history of diabetes mellitus 09/29/2019  . Family history of hyperlipidemia 09/29/2019  . Family history of hypothyroidism 09/29/2019  . High cholesterol   . History of cervical cancer 10/27/2019  . History of kidney stones    passed  . Prehypertension 06/26/2020  . Respiratory failure with hypoxia ([place]) 06/30/2020  . S/P lumbar fusion 04/20/2018  . Screening for colorectal cancer 12/18/2019  . Soreness of tongue 04/23/2020    Past Surgical History:  Procedure Laterality Date  . ABDOMINAL HYSTERECTOMY     . BREAST BIOPSY Right   . CERVICAL CONIZATION W/BX  03/24/2012   Procedure: CONIZATION CERVIX WITH BIOPSY;  Surgeon: Woodroe Mode, MD;  Location: [facility];  Service: Gynecology;  Laterality: N/A;  . DIAGNOSTIC LAPAROSCOPY  2007   ectopic  . ECTOPIC PREGNANCY SURGERY    . ESSURE TUBAL LIGATION  2010  . INSERTION OF SUPRAPUBIC CATHETER  05/03/2012   Procedure: INSERTION OF SUPRAPUBIC CATHETER;  Surgeon: Alvino Chapel, MD;  Location: [facility];  Service: Gynecology;;  . LEEP    . LUMBAR LAMINECTOMY/DECOMPRESSION MICRODISCECTOMY N/A 04/20/2018   Procedure: LUMBAR FIVE TO SACRAL ONE DECOMPRESSION AND FUSION, POSSIBLE LUMBAR FOUR TO SACRAL ONE; SCREW AND CAGE INSTRUMENTATION;  Surgeon: Melina Schools, MD;  Location: [facility];  Service: Orthopedics;  Laterality: N/A;  5 hrs  . LYMPHADENECTOMY  05/03/2012   Procedure: LYMPHADENECTOMY;  Surgeon: Alvino Chapel, MD;  Location: [facility];  Service: Gynecology;  Laterality: Bilateral;  pelvic  . RADICAL HYSTERECTOMY  05/03/2012   Procedure: RADICAL HYSTERECTOMY;  Surgeon: Alvino Chapel, MD;  Location: [facility];  Service: Gynecology;;  transposition of the ovaries  . SPINE SURGERY    . TUBAL LIGATION      There were no vitals filed for this visit.   Subjective Assessment - 09/11/20 1730    Subjective Patient says her back and hip are doing better. Her knee is hurting "  about a 4".    Pertinent History lumbar fusion 2019    Currently in Pain? Yes    Pain Score 4     Pain Location Knee    Pain Orientation Left    Pain Descriptors / Indicators Sharp    Pain Type Chronic pain    Pain Onset More than a month ago    Pain Frequency Constant                             [facility] Adult PT Treatment/Exercise - 09/11/20 0001      Knee/Hip Exercises: Stretches   Sports administrator Left;4 reps;30 seconds   prone quad stretch with strap   Gastroc Stretch Both;3 reps;30 seconds      Knee/Hip Exercises: Aerobic   Recumbent  Bike 4 min dynamic warmup Lv 2      Knee/Hip Exercises: Standing   Heel Raises Both;2 sets;10 reps    Knee Flexion Both;2 sets;10 reps    Knee Flexion Limitations 2#    Hip Abduction Both;2 sets;10 reps    Abduction Limitations 2#    Hip Extension Both;2 sets;10 reps    Extension Limitations 2#    Functional Squat 10 reps    Functional Squat Limitations to chair for depth cue    Other Standing Knee Exercises palloff press GTB x 20 each                    PT Short Term Goals - 08/26/20 1654      PT SHORT TERM GOAL #1   Title Patient will be independent in self management strategies to improve quality of life and functional outcomes.    Time 3    Period Weeks    Status New    Target Date 09/16/20      PT SHORT TERM GOAL #2   Title Patient will be able to lift right leg through full ROM without severe pain to demonstrate improved active hip extension    Time 3    Period Weeks    Status New    Target Date 09/16/20      PT SHORT TERM GOAL #3   Title Patient will report at least 25% improvement in overall symptoms and/or function to demonstrate improved functional mobility    Time 3    Period Weeks    Status New    Target Date 09/16/20             PT Long Term Goals - 08/26/20 1655      PT LONG TERM GOAL #1   Title Patient will report at least 50% improvement in overall symptoms and/or function to demonstrate improved functional mobility    Baseline Patient will be able to walk for at least 5 minutes without increase in base line lumbar pain    Time 6    Period Weeks    Status New    Target Date 10/07/20      PT LONG TERM GOAL #2   Title Patient will score with <20% disability on oswestry disability index    Time 6    Period Weeks    Status New    Target Date 10/07/20      PT LONG TERM GOAL #3   Title Patient will be able to stand and walk for at least 10 minutes without increase in baseline pain to improve funcitonal walking endurance    Time 6  Period Weeks    Status New    Target Date 10/07/20                 Plan - 09/11/20 1817    Clinical Impression Statement Patient tolerated session well today with no increased complaint of pain. Patient able to progress core strength with adding palloff press. Added forward step ups for LE strengthening. Added resistance to hip abduction and extension. Patient educated on form and function of all added exercise and progressions. Reviewed HEP and addition of standing hip abduction and prone quad stretching to HEP. Patient noting decreased knee pain post treatment. Patient will continue to benefit form therapy services to reduce restrictions and improve function.    Personal Factors and Comorbidities Comorbidity 1    Comorbidities lumbar fusion in 2019    Examination-Activity Limitations Squat;Sleep;Stand;Lift;Locomotion Level;Bend    Examination-Participation Restrictions Occupation;Meal Prep;Shop    Stability/Clinical Decision Making Stable/Uncomplicated    Rehab Potential Fair    PT Frequency 2x / week    PT Duration 6 weeks    PT Treatment/Interventions ADLs/Self Care Home Management;Aquatic Therapy;Cryotherapy;Electrical Stimulation;Moist Heat;Traction;Balance training;Therapeutic exercise;Therapeutic activities;Functional mobility training;Stair training;Gait training;DME Instruction;Neuromuscular re-education;Patient/family education;Manual techniques;Dry needling    PT Next Visit Plan Aquatics, isometrics, lumbar and hip ROM, core and LE strenthening. Add single leg stance    PT Home Exercise Plan glute squeezes; 08/28/20: abdominal sets, LTR, SKTC; 3/2 POE, SAQs, hip extension, hip add isometric 3/9 prone quad stretch, standing hip abduction    Consulted and Agree with Plan of Care Patient;Family member/caregiver           Patient will benefit from skilled therapeutic intervention in order to improve the following deficits and impairments:  Pain,Decreased strength,Decreased  activity tolerance,Decreased mobility,Hypomobility  Visit Diagnosis: Muscle weakness (generalized)  Chronic right-sided low back pain with right-sided sciatica  Chronic pain of left knee     Problem List Patient Active Problem List   Diagnosis Date Noted  . Annual visit for general adult medical examination with abnormal findings 09/10/2020  . 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  . Anxiety with depression 06/30/2020  . Prehypertension 06/26/2020  . Acute low back pain 05/21/2020  . Tachycardia determined by examination of pulse 05/17/2020  . Right tennis elbow 02/21/2020  . Vaginal Pap smear following hysterectomy for malignancy 12/18/2019  . Environmental and seasonal allergies 09/29/2019  . Nicotine dependence 09/29/2019  . Binge eating 09/29/2019  . Migraine 09/29/2019  . Bilateral carpal tunnel syndrome 09/29/2019  . Morbid obesity ([place]) 09/29/2019  . Other fatigue 09/29/2019  . Vitamin D deficiency 09/29/2019    6:22 PM, 09/11/20 Josue Hector PT DPT  Physical Therapist with [facility]  [phone] [place] [address] [place], [place], [address] Phone: [phone]   Fax:  [phone]  Name: April Ayala MRN: [id number] Date of Birth: 11-Jan-1976

## 2020-09-12 ENCOUNTER — Encounter: Payer: Self-pay | Admitting: Family Medicine

## 2020-09-17 ENCOUNTER — Ambulatory Visit (HOSPITAL_COMMUNITY): Payer: BC Managed Care – PPO

## 2020-09-17 ENCOUNTER — Telehealth (HOSPITAL_COMMUNITY): Payer: Self-pay

## 2020-09-17 ENCOUNTER — Encounter: Payer: Medicaid Other | Admitting: Family Medicine

## 2020-09-17 NOTE — Telephone Encounter (Signed)
No show, called and left message concerning missed apt.  Reminded next apt date and time with contact number included if needs to cancel/reschedule further apts.  Included no show policy details in message as well.    Ihor Austin, LPTA/CLT; Delana Meyer [phone]

## 2020-09-19 ENCOUNTER — Other Ambulatory Visit: Payer: Self-pay | Admitting: Family Medicine

## 2020-09-19 ENCOUNTER — Encounter (HOSPITAL_COMMUNITY): Payer: BC Managed Care – PPO | Admitting: Physical Therapy

## 2020-09-19 ENCOUNTER — Ambulatory Visit: Payer: BC Managed Care – PPO | Admitting: Orthopedic Surgery

## 2020-09-19 DIAGNOSIS — F419 Anxiety disorder, unspecified: Secondary | ICD-10-CM

## 2020-09-20 MED ORDER — BUSPIRONE HCL 5 MG PO TABS: 5.0000 mg | ORAL_TABLET | Freq: Two times a day (BID) | ORAL | 0 refills | Status: DC

## 2020-09-23 ENCOUNTER — Telehealth: Payer: Self-pay | Admitting: Orthopedic Surgery

## 2020-09-23 ENCOUNTER — Ambulatory Visit (HOSPITAL_COMMUNITY): Payer: BC Managed Care – PPO | Admitting: Physical Therapy

## 2020-09-23 NOTE — Telephone Encounter (Signed)
Patient called, relays therapy order for left knee was closed or cancelled by [facility]. Requests it to be reordered.

## 2020-09-23 NOTE — Telephone Encounter (Signed)
Have opened referral again.

## 2020-09-26 ENCOUNTER — Encounter: Payer: Self-pay | Admitting: Family Medicine

## 2020-09-26 ENCOUNTER — Other Ambulatory Visit: Payer: Self-pay | Admitting: *Deleted

## 2020-09-26 DIAGNOSIS — E7841 Elevated Lipoprotein(a): Secondary | ICD-10-CM

## 2020-09-26 DIAGNOSIS — G43809 Other migraine, not intractable, without status migrainosus: Secondary | ICD-10-CM

## 2020-09-26 DIAGNOSIS — F419 Anxiety disorder, unspecified: Secondary | ICD-10-CM

## 2020-09-26 MED ORDER — ROSUVASTATIN CALCIUM 5 MG PO TABS: 5.0000 mg | ORAL_TABLET | Freq: Every day | ORAL | 1 refills | Status: DC

## 2020-09-26 MED ORDER — AMITRIPTYLINE HCL 75 MG PO TABS: 75.0000 mg | ORAL_TABLET | Freq: Every day | ORAL | 1 refills | Status: DC

## 2020-09-27 ENCOUNTER — Telehealth: Payer: Self-pay | Admitting: Orthopaedic Surgery

## 2020-09-27 NOTE — Telephone Encounter (Signed)
Patient would like to proceed with carpal tunnel surgery. Please provide surgery sheet.   Pt's cb  [phone]

## 2020-09-29 DIAGNOSIS — J84116 Cryptogenic organizing pneumonia: Secondary | ICD-10-CM | POA: Diagnosis not present

## 2020-09-29 DIAGNOSIS — J9691 Respiratory failure, unspecified with hypoxia: Secondary | ICD-10-CM | POA: Diagnosis not present

## 2020-09-30 ENCOUNTER — Ambulatory Visit (HOSPITAL_COMMUNITY): Payer: BC Managed Care – PPO | Admitting: Physical Therapy

## 2020-10-01 NOTE — Telephone Encounter (Signed)
Complete surgery form

## 2020-10-07 ENCOUNTER — Encounter: Payer: Self-pay | Admitting: Orthopedic Surgery

## 2020-10-07 ENCOUNTER — Ambulatory Visit (INDEPENDENT_AMBULATORY_CARE_PROVIDER_SITE_OTHER): Payer: BC Managed Care – PPO | Admitting: Orthopedic Surgery

## 2020-10-07 ENCOUNTER — Other Ambulatory Visit: Payer: Self-pay

## 2020-10-07 VITALS — BP 157/98 | HR 98 | Ht 63.0 in | Wt 225.0 lb

## 2020-10-07 DIAGNOSIS — F1190 Opioid use, unspecified, uncomplicated: Secondary | ICD-10-CM | POA: Diagnosis not present

## 2020-10-07 DIAGNOSIS — M222X2 Patellofemoral disorders, left knee: Secondary | ICD-10-CM | POA: Diagnosis not present

## 2020-10-07 DIAGNOSIS — G8929 Other chronic pain: Secondary | ICD-10-CM

## 2020-10-07 DIAGNOSIS — M25562 Pain in left knee: Secondary | ICD-10-CM

## 2020-10-07 NOTE — Patient Instructions (Signed)
Fu after 6 weeks of therapy (call the office after you have had 6 weeks of therapy)

## 2020-10-07 NOTE — Addendum Note (Signed)
Addended byCandice Camp on: 10/07/2020 03:46 PM   Modules accepted: Orders

## 2020-10-07 NOTE — Progress Notes (Signed)
Chief Complaint  Patient presents with  . Knee Pain    left   Encounter Diagnoses  Name Primary?  . Chronic pain of left knee Yes  . Patellofemoral pain syndrome of left knee   . Opioid use    45 year old female with patellofemoral pain syndrome, recommend patellofemoral exercises through physical therapy use topical medications as the patient is already on opioid therapy for other chronic pain   PRESENTED BACK TODAY WITH increasing pain in the knee    However the patient is doing any therapy.  I just saw her in everywhere  I do not expect any improvement  She has medial lateral pain medial lateral tenderness decreased range of motion  Advised to undergo therapy for she is not a surgical candidate because of her opioid use unless there is some major surgical lesion  Follow-up in 6 to 8 weeks

## 2020-10-10 ENCOUNTER — Ambulatory Visit (HOSPITAL_COMMUNITY): Payer: BC Managed Care – PPO | Admitting: Physical Therapy

## 2020-10-17 ENCOUNTER — Other Ambulatory Visit: Payer: Self-pay | Admitting: Nurse Practitioner

## 2020-10-17 ENCOUNTER — Other Ambulatory Visit: Payer: Self-pay | Admitting: Orthopaedic Surgery

## 2020-10-17 DIAGNOSIS — G5612 Other lesions of median nerve, left upper limb: Secondary | ICD-10-CM | POA: Diagnosis not present

## 2020-10-17 DIAGNOSIS — F419 Anxiety disorder, unspecified: Secondary | ICD-10-CM

## 2020-10-17 DIAGNOSIS — G5602 Carpal tunnel syndrome, left upper limb: Secondary | ICD-10-CM | POA: Diagnosis not present

## 2020-10-17 MED ORDER — BUSPIRONE HCL 5 MG PO TABS: 5.0000 mg | ORAL_TABLET | Freq: Two times a day (BID) | ORAL | 0 refills | Status: DC

## 2020-10-22 ENCOUNTER — Telehealth: Payer: Self-pay

## 2020-10-22 NOTE — Telephone Encounter (Signed)
Received a PA request from pt's pharmacy. We already have an approval by [employer] through 02/03/21. I called the pt's pharmacy and spoke with staff. I was told the pt has healthy blue. ID: [id number], BIN: [id number], PCN: [id number], GROUP: [id number]. We will do another PA.

## 2020-10-22 NOTE — Telephone Encounter (Signed)
I have completed a PA request for [employer] on [employer], Key: [id number].  Received instant approval. PA Case: [id number], Status: Approved, Coverage Starts on: 10/22/2020 12:00:00 AM, Coverage Ends on: 10/22/2021 12:00:00 AM.

## 2020-10-22 NOTE — Telephone Encounter (Signed)
Received fax from [facility] stating [employer] has been approved 10/22/2020 through 10/22/2021. I faxed this notice to the pt's pharmacy. Received a receipt of confirmation.

## 2020-10-29 ENCOUNTER — Ambulatory Visit (INDEPENDENT_AMBULATORY_CARE_PROVIDER_SITE_OTHER): Payer: BC Managed Care – PPO | Admitting: Orthopaedic Surgery

## 2020-10-29 ENCOUNTER — Other Ambulatory Visit: Payer: Self-pay

## 2020-10-29 ENCOUNTER — Encounter: Payer: Self-pay | Admitting: Orthopaedic Surgery

## 2020-10-29 VITALS — Ht 63.0 in | Wt 225.0 lb

## 2020-10-29 DIAGNOSIS — G5603 Carpal tunnel syndrome, bilateral upper limbs: Secondary | ICD-10-CM

## 2020-10-29 DIAGNOSIS — G5602 Carpal tunnel syndrome, left upper limb: Secondary | ICD-10-CM | POA: Diagnosis not present

## 2020-10-29 NOTE — Progress Notes (Signed)
Office Visit Note   Patient: April Ayala           Date of Birth: [birth year]           MRN: [id number] Visit Date: 10/29/2020              Requested by: Perlie Mayo, NP [address] [place],  [place] [address] PCP: Perlie Mayo, NP   Assessment & Plan: Visit Diagnoses:  1. Bilateral carpal tunnel syndrome     Plan: 12 days status post left carpal tunnel release and doing very well.  No longer has the numbness or tingling in her hand.  Dressing was removed.  Stitches were removed as well and I applied Steri-Strips over benzoin waterproof Band-Aid and a volar wrist splint.  We will plan to check her back in a week  Follow-Up Instructions: Return in about 1 week (around 11/05/2020).   Orders:  No orders of the defined types were placed in this encounter.  No orders of the defined types were placed in this encounter.     Procedures: No procedures performed   Clinical Data: No additional findings.   Subjective: Chief Complaint  Patient presents with  . Left Wrist - Follow-up    Left carpal tunnel release 10/17/2020  Patient presents today for follow up on her left wrist. She had a left carpal tunnel release on 10/17/2020. She is now 12 days out from surgery. Patient states that she is doing well. She said that it is very sore. She is in pain management and states that they have been great about addressing her pain since surgery.  HPI  Review of Systems   Objective: Vital Signs: Ht 5\' 3"  (1.6 m)   Wt 225 lb (102.1 kg)   LMP 04/11/2012   BMI 39.86 kg/m   Physical Exam  Ortho Exam left hand carpal tunnel incision healing without problem.  This was cleaned with alcohol and the stitches removed.  I applied Steri-Strips over benzoin and a waterproof Band-Aid.  Normal sensation of the tips of her fingers.  A little bit of residual numbness in her thumb.  Able to oppose thumb to little finger.  Full range of motion of her hand  Specialty Comments:  No specialty  comments available.  Imaging: No results found.   PMFS History: Patient Active Problem List   Diagnosis Date Noted  . Annual visit for general adult medical examination with abnormal findings 09/10/2020  . 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  . Anxiety with depression 06/30/2020  . Prehypertension 06/26/2020  . Acute low back pain 05/21/2020  . Tachycardia determined by examination of pulse 05/17/2020  . Right tennis elbow 02/21/2020  . Vaginal Pap smear following hysterectomy for malignancy 12/18/2019  . Environmental and seasonal allergies 09/29/2019  . Nicotine dependence 09/29/2019  . Binge eating 09/29/2019  . Migraine 09/29/2019  . Bilateral carpal tunnel syndrome 09/29/2019  . Morbid obesity ([place]) 09/29/2019  . Other fatigue 09/29/2019  . Vitamin D deficiency 09/29/2019   Past Medical History:  Diagnosis Date  . Angular cheilitis 07/17/2020  . Anxiety   . BOOP (bronchiolitis obliterans with organizing pneumonia) ([place]) 07/09/2020  . Cervical cancer ([place]) 04/2011   Stage 1B squamous cell  . Chronic left shoulder pain 10/07/2017  . Chronic tension-type headache, not intractable 10/07/2017  . Class 2 obesity 09/29/2019  . Cold sore 04/11/2020  . Depression   . Encounter for screening mammogram for malignant neoplasm of breast 09/29/2019  . Encounter for  support and coordination of transition of care 07/09/2020  . Family history of diabetes mellitus 09/29/2019  . Family history of hyperlipidemia 09/29/2019  . Family history of hypothyroidism 09/29/2019  . High cholesterol   . History of cervical cancer 10/27/2019  . History of kidney stones    passed  . Prehypertension 06/26/2020  . Respiratory failure with hypoxia ([place]) 06/30/2020  . S/P lumbar fusion 04/20/2018  . Screening for colorectal cancer 12/18/2019  . Soreness of tongue 04/23/2020    Family History  Problem Relation Age of Onset  . Diabetes Mother   . Hypertension Father   . Diabetes Sister   . Migraines Sister    . Hypothyroidism Sister   . Migraines Sister   . Migraines Sister     Past Surgical History:  Procedure Laterality Date  . ABDOMINAL HYSTERECTOMY    . BREAST BIOPSY Right   . CERVICAL CONIZATION W/BX  03/24/2012   Procedure: CONIZATION CERVIX WITH BIOPSY;  Surgeon: Woodroe Mode, MD;  Location: [facility];  Service: Gynecology;  Laterality: N/A;  . DIAGNOSTIC LAPAROSCOPY  2007   ectopic  . ECTOPIC PREGNANCY SURGERY    . ESSURE TUBAL LIGATION  2010  . INSERTION OF SUPRAPUBIC CATHETER  05/03/2012   Procedure: INSERTION OF SUPRAPUBIC CATHETER;  Surgeon: Alvino Chapel, MD;  Location: [facility];  Service: Gynecology;;  . LEEP    . LUMBAR LAMINECTOMY/DECOMPRESSION MICRODISCECTOMY N/A 04/20/2018   Procedure: LUMBAR FIVE TO SACRAL ONE DECOMPRESSION AND FUSION, POSSIBLE LUMBAR FOUR TO SACRAL ONE; SCREW AND CAGE INSTRUMENTATION;  Surgeon: Melina Schools, MD;  Location: [facility];  Service: Orthopedics;  Laterality: N/A;  5 hrs  . LYMPHADENECTOMY  05/03/2012   Procedure: LYMPHADENECTOMY;  Surgeon: Alvino Chapel, MD;  Location: [facility];  Service: Gynecology;  Laterality: Bilateral;  pelvic  . RADICAL HYSTERECTOMY  05/03/2012   Procedure: RADICAL HYSTERECTOMY;  Surgeon: Alvino Chapel, MD;  Location: [facility];  Service: Gynecology;;  transposition of the ovaries  . SPINE SURGERY    . TUBAL LIGATION     Social History   Occupational History  . Occupation: [employer]  Tobacco Use  . Smoking status: Former Smoker    Packs/day: 0.50    Years: 1.00    Pack years: 0.50    Types: Cigarettes    Start date: 11/03/2018  . Smokeless tobacco: Never Used  . Tobacco comment: smoking cessation information given  Vaping Use  . Vaping Use: Never used  Substance and Sexual Activity  . Alcohol use: Never  . Drug use: Never  . Sexual activity: Not Currently    Birth control/protection: Surgical    Comment: hyst

## 2020-10-30 DIAGNOSIS — J9691 Respiratory failure, unspecified with hypoxia: Secondary | ICD-10-CM | POA: Diagnosis not present

## 2020-10-30 DIAGNOSIS — J84116 Cryptogenic organizing pneumonia: Secondary | ICD-10-CM | POA: Diagnosis not present

## 2020-11-11 ENCOUNTER — Encounter (HOSPITAL_COMMUNITY): Payer: Self-pay | Admitting: Physical Therapy

## 2020-11-11 ENCOUNTER — Ambulatory Visit (HOSPITAL_COMMUNITY): Payer: BC Managed Care – PPO | Attending: Orthopedic Surgery | Admitting: Physical Therapy

## 2020-11-11 ENCOUNTER — Other Ambulatory Visit: Payer: Self-pay

## 2020-11-11 DIAGNOSIS — M545 Low back pain, unspecified: Secondary | ICD-10-CM

## 2020-11-11 DIAGNOSIS — M6281 Muscle weakness (generalized): Secondary | ICD-10-CM

## 2020-11-11 DIAGNOSIS — M25562 Pain in left knee: Secondary | ICD-10-CM | POA: Diagnosis present

## 2020-11-11 NOTE — Patient Instructions (Signed)
Access Code: [email or web address] URL: [email or web address] Date: 11/11/2020 Prepared by: Josue Hector  Exercises Supine Transversus Abdominis Bracing - Hands on Stomach - 2 x daily - 7 x weekly - 2 sets - 10 reps - 5 seconds hold Supine March - 2 x daily - 7 x weekly - 2 sets - 10 reps Active Straight Leg Raise with Quad Set - 2 x daily - 7 x weekly - 2 sets - 10 reps - 3 seconds hold Sidelying Hip Abduction - 2 x daily - 7 x weekly - 2 sets - 10 reps - 3 seconds hold

## 2020-11-11 NOTE — Therapy (Signed)
[place], [place], [address] Phone: [phone]   Fax:  [phone]  Physical Therapy Evaluation  Patient Details  Name: April Ayala MRN: [id number] Date of Birth: [birth year] Referring Provider (PT): Melina Schools MD   Encounter Date: 11/11/2020   PT End of Session - 11/11/20 1110    Visit Number 1    Number of Visits 12    Date for PT Re-Evaluation 12/09/20    Authorization Type WC    Progress Note Due on Visit 6    PT Start Time 1032    PT Stop Time 1112    PT Time Calculation (min) 40 min    Activity Tolerance Patient tolerated treatment well    Behavior During Therapy [facility] for tasks assessed/performed           Past Medical History:  Diagnosis Date  . Angular cheilitis 07/17/2020  . Anxiety   . BOOP (bronchiolitis obliterans with organizing pneumonia) ([place]) 07/09/2020  . Cervical cancer ([place]) 04/2011   Stage 1B squamous cell  . Chronic left shoulder pain 10/07/2017  . Chronic tension-type headache, not intractable 10/07/2017  . Class 2 obesity 09/29/2019  . Cold sore 04/11/2020  . Depression   . Encounter for screening mammogram for malignant neoplasm of breast 09/29/2019  . Encounter for support and coordination of transition of care 07/09/2020  . Family history of diabetes mellitus 09/29/2019  . Family history of hyperlipidemia 09/29/2019  . Family history of hypothyroidism 09/29/2019  . High cholesterol   . History of cervical cancer 10/27/2019  . History of kidney stones    passed  . Prehypertension 06/26/2020  . Respiratory failure with hypoxia ([place]) 06/30/2020  . S/P lumbar fusion 04/20/2018  . Screening for colorectal cancer 12/18/2019  . Soreness of tongue 04/23/2020    Past Surgical History:  Procedure Laterality Date  . ABDOMINAL HYSTERECTOMY    . BREAST BIOPSY Right   . CERVICAL CONIZATION W/BX  03/24/2012   Procedure: CONIZATION CERVIX WITH BIOPSY;  Surgeon: Woodroe Mode, MD;   Location: [facility];  Service: Gynecology;  Laterality: N/A;  . DIAGNOSTIC LAPAROSCOPY  2007   ectopic  . ECTOPIC PREGNANCY SURGERY    . ESSURE TUBAL LIGATION  2010  . INSERTION OF SUPRAPUBIC CATHETER  05/03/2012   Procedure: INSERTION OF SUPRAPUBIC CATHETER;  Surgeon: Alvino Chapel, MD;  Location: [facility];  Service: Gynecology;;  . LEEP    . LUMBAR LAMINECTOMY/DECOMPRESSION MICRODISCECTOMY N/A 04/20/2018   Procedure: LUMBAR FIVE TO SACRAL ONE DECOMPRESSION AND FUSION, POSSIBLE LUMBAR FOUR TO SACRAL ONE; SCREW AND CAGE INSTRUMENTATION;  Surgeon: Melina Schools, MD;  Location: [facility];  Service: Orthopedics;  Laterality: N/A;  5 hrs  . LYMPHADENECTOMY  05/03/2012   Procedure: LYMPHADENECTOMY;  Surgeon: Alvino Chapel, MD;  Location: [facility];  Service: Gynecology;  Laterality: Bilateral;  pelvic  . RADICAL HYSTERECTOMY  05/03/2012   Procedure: RADICAL HYSTERECTOMY;  Surgeon: Alvino Chapel, MD;  Location: [facility];  Service: Gynecology;;  transposition of the ovaries  . SPINE SURGERY    . TUBAL LIGATION      There were no vitals filed for this visit.    Subjective Assessment - 11/11/20 1042    Subjective Patient presents to therapy with complaint of LBP. She also has LT knee pain. Pain bothers her walking, standing, increased activity. She has had therapy previously but says things have been "up and down" since then. She feels her LT leg  is weak. She had to DC therapy previously due to legal reasons but has returned with new referral with different insurance. She has been recommended for aquatic therapy due to current functional restrictions.    Pertinent History lumbar fusion 2019    Limitations Lifting;House hold activities;Standing;Walking    Patient Stated Goals Be more mobile with less pain    Currently in Pain? Yes   no back pain currently at rest   Pain Score 6     Pain Location Knee    Pain Orientation Left    Pain Descriptors / Indicators Stabbing    Pain Type  Chronic pain    Pain Onset More than a month ago    Pain Frequency Constant    Aggravating Factors  bending, lifting, walking    Pain Relieving Factors Meds    Effect of Pain on Daily Activities Limits              [facility] PT Assessment - 11/11/20 0001      Assessment   Medical Diagnosis low back pain    Referring Provider (PT) Melina Schools MD    Prior Therapy Yes      Restrictions   Other Position/Activity Restrictions Patient reported "10 lb lifting, bending, lifting, twisting, reaching overhead"      [place] residence    Living Arrangements Children;Spouse/significant other      Prior Function   Level of Independence Independent    Vocation --   on medical leave right now     Cognition   Overall Cognitive Status Within Functional Limits for tasks assessed      Observation/Other Assessments   Focus on Therapeutic Outcomes (FOTO)  56% function      Posture/Postural Control   Posture/Postural Control Postural limitations    Postural Limitations Decreased lumbar lordosis      AROM   AROM Assessment Site Knee    Right/Left Knee Right;Left    Right Knee Extension 0    Right Knee Flexion 125    Left Knee Extension 3    Left Knee Flexion 92    Lumbar Flexion WFL    Lumbar Extension 75% limited    Lumbar - Right Side Bend 50% limited    Lumbar - Left Side Bend 50% limited      Strength   Right Hip Flexion 4+/5    Right Hip Extension 3-/5    Right Hip ABduction 4/5    Left Hip Flexion 4/5    Left Hip Extension 3-/5    Left Hip ABduction 3+/5    Right Knee Extension 5/5    Left Knee Extension 4+/5                      Objective measurements completed on examination: See above findings.       [facility] Adult PT Treatment/Exercise - 11/11/20 0001      Knee/Hip Exercises: Stretches   Hip Flexor Stretch Left;1 rep;30 seconds    Hip Flexor Stretch Limitations modified thomas      Knee/Hip Exercises: Supine    Straight Leg Raises Left;10 reps    Other Supine Knee/Hip Exercises ab set 5 x 5", bent knee march x10      Knee/Hip Exercises: Sidelying   Hip ABduction Left;10 reps                  PT Education - 11/11/20 1044    Education Details On evaluation  findings, POC and HEP    Person(s) Educated Patient    Methods Explanation;Handout    Comprehension Verbalized understanding            PT Short Term Goals - 11/11/20 1622      PT SHORT TERM GOAL #1   Title Patient will be independent in self management strategies to improve quality of life and functional outcomes.    Time 2    Period Weeks    Status New    Target Date 11/25/20             PT Long Term Goals - 11/11/20 1623      PT LONG TERM GOAL #1   Title Patient will improve FOTO score by 10% to indicate improvement in functional outcomes    Time 4    Period Weeks    Status New    Target Date 12/09/20      PT LONG TERM GOAL #2   Title Patient will have equal to or > 4/5 MMT throughout BLE to improve ability to perform functional mobility, stair ambulation and ADLs.    Time 4    Period Weeks    Status New    Target Date 12/09/20      PT LONG TERM GOAL #3   Title Patient will report at least 70% overall improvement in subjective complaint to indicate improvement in ability to perform ADLs.    Time 4    Period Weeks    Status New    Target Date 12/09/20      PT LONG TERM GOAL #4   Title Patient will be able to stand 3-4 hours with pain not to exceed 3/10 in lumbar for improved tolerance to work tasks and ADLs.    Time 4    Period Weeks    Status New    Target Date 12/09/20                  Plan - 11/11/20 1613    Clinical Impression Statement Patient is a 45 y.o. female who presents to physical therapy with complaint of LBP. Patient demonstrates decreased strength, ROM restriction and postural abnormalities which are likely contributing to symptoms of pain and are negatively impacting patient  ability to perform ADLs and functional mobility tasks. Patient will benefit from skilled physical therapy services to address these deficits to reduce pain  and improve level of function with ADLs and functional mobility tasks.    Personal Factors and Comorbidities Comorbidity 1    Comorbidities lumbar fusion in 2019    Examination-Activity Limitations Squat;Sleep;Stand;Lift;Locomotion Level;Bend    Examination-Participation Restrictions Occupation;Meal Prep;Shop    Stability/Clinical Decision Making Stable/Uncomplicated    Rehab Potential Good    PT Frequency 3x / week    PT Duration 4 weeks    PT Treatment/Interventions ADLs/Self Care Home Management;Aquatic Therapy;Cryotherapy;Electrical Stimulation;Moist Heat;Traction;Balance training;Therapeutic exercise;Therapeutic activities;Functional mobility training;Stair training;Gait training;DME Instruction;Neuromuscular re-education;Patient/family education;Manual techniques;Dry needling;Biofeedback;Passive range of motion;Vasopneumatic Device;Spinal Manipulations;Joint Manipulations;Energy conservation;Splinting;Taping;Orthotic Fit/Training;Scar mobilization;Canalith Repostioning;Ultrasound;Fluidtherapy;Contrast Bath    PT Next Visit Plan Review goals. Progress core and glute strength as toleratedl. Manual as needed for pain.    PT Home Exercise Plan Eval: SLR, ab set, ab march, sidelying hip abduction    Consulted and Agree with Plan of Care Patient           Patient will benefit from skilled therapeutic intervention in order to improve the following deficits and impairments:  Pain,Decreased strength,Decreased activity tolerance,Decreased mobility,Hypomobility  Visit Diagnosis: Low  back pain, unspecified back pain laterality, unspecified chronicity, unspecified whether sciatica present  Left knee pain, unspecified chronicity  Muscle weakness (generalized)     Problem List Patient Active Problem List   Diagnosis Date Noted  . Annual  visit for general adult medical examination with abnormal findings 09/10/2020  . 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  . Anxiety with depression 06/30/2020  . Prehypertension 06/26/2020  . Acute low back pain 05/21/2020  . Tachycardia determined by examination of pulse 05/17/2020  . Right tennis elbow 02/21/2020  . Vaginal Pap smear following hysterectomy for malignancy 12/18/2019  . Environmental and seasonal allergies 09/29/2019  . Nicotine dependence 09/29/2019  . Binge eating 09/29/2019  . Migraine 09/29/2019  . Bilateral carpal tunnel syndrome 09/29/2019  . Morbid obesity ([place]) 09/29/2019  . Other fatigue 09/29/2019  . Vitamin D deficiency 09/29/2019    4:30 PM, 11/11/20 Josue Hector PT DPT  Physical Therapist with [facility]  [phone] [place] [address] [place], [place], [address] Phone: [phone]   Fax:  [phone]  Name: KEEYANA DIAL MRN: [id number] Date of Birth: Feb 07, 1976

## 2020-11-12 ENCOUNTER — Ambulatory Visit: Payer: BC Managed Care – PPO | Admitting: Orthopaedic Surgery

## 2020-11-14 ENCOUNTER — Telehealth: Payer: BC Managed Care – PPO | Admitting: Physician Assistant

## 2020-11-14 ENCOUNTER — Institutional Professional Consult (permissible substitution): Payer: BC Managed Care – PPO | Admitting: Plastic Surgery

## 2020-11-18 ENCOUNTER — Encounter (HOSPITAL_COMMUNITY): Payer: Self-pay | Admitting: Physical Therapy

## 2020-11-18 ENCOUNTER — Ambulatory Visit: Payer: BC Managed Care – PPO | Admitting: Orthopedic Surgery

## 2020-11-18 ENCOUNTER — Ambulatory Visit (HOSPITAL_COMMUNITY): Payer: BC Managed Care – PPO | Admitting: Physical Therapy

## 2020-11-18 DIAGNOSIS — M545 Low back pain, unspecified: Secondary | ICD-10-CM | POA: Diagnosis not present

## 2020-11-18 NOTE — Therapy (Signed)
[employer] [place], [place], [address] Phone: [phone]   Fax:  [phone]  Physical Therapy Treatment  Patient Details  Name: April Ayala MRN: [id number] Date of Birth: May 21, 1976 Referring Provider (PT): Melina Schools MD   Encounter Date: 11/18/2020   PT End of Session - 11/18/20 1638    Visit Number 2    Number of Visits 12    Date for PT Re-Evaluation 12/09/20    Authorization Type WC    Progress Note Due on Visit 6    PT Start Time 1505    PT Stop Time 1530    PT Time Calculation (min) 25 min    Activity Tolerance Patient tolerated treatment well    Behavior During Therapy [facility] for tasks assessed/performed           Past Medical History:  Diagnosis Date  . Angular cheilitis 07/17/2020  . Anxiety   . BOOP (bronchiolitis obliterans with organizing pneumonia) ([place]) 07/09/2020  . Cervical cancer ([place]) 04/2011   Stage 1B squamous cell  . Chronic left shoulder pain 10/07/2017  . Chronic tension-type headache, not intractable 10/07/2017  . Class 2 obesity 09/29/2019  . Cold sore 04/11/2020  . Depression   . Encounter for screening mammogram for malignant neoplasm of breast 09/29/2019  . Encounter for support and coordination of transition of care 07/09/2020  . Family history of diabetes mellitus 09/29/2019  . Family history of hyperlipidemia 09/29/2019  . Family history of hypothyroidism 09/29/2019  . High cholesterol   . History of cervical cancer 10/27/2019  . History of kidney stones    passed  . Prehypertension 06/26/2020  . Respiratory failure with hypoxia ([place]) 06/30/2020  . S/P lumbar fusion 04/20/2018  . Screening for colorectal cancer 12/18/2019  . Soreness of tongue 04/23/2020    Past Surgical History:  Procedure Laterality Date  . ABDOMINAL HYSTERECTOMY    . BREAST BIOPSY Right   . CERVICAL CONIZATION W/BX  03/24/2012   Procedure: CONIZATION CERVIX WITH BIOPSY;  Surgeon: Woodroe Mode, MD;   Location: [facility];  Service: Gynecology;  Laterality: N/A;  . DIAGNOSTIC LAPAROSCOPY  2007   ectopic  . ECTOPIC PREGNANCY SURGERY    . ESSURE TUBAL LIGATION  2010  . INSERTION OF SUPRAPUBIC CATHETER  05/03/2012   Procedure: INSERTION OF SUPRAPUBIC CATHETER;  Surgeon: Alvino Chapel, MD;  Location: [facility];  Service: Gynecology;;  . LEEP    . LUMBAR LAMINECTOMY/DECOMPRESSION MICRODISCECTOMY N/A 04/20/2018   Procedure: LUMBAR FIVE TO SACRAL ONE DECOMPRESSION AND FUSION, POSSIBLE LUMBAR FOUR TO SACRAL ONE; SCREW AND CAGE INSTRUMENTATION;  Surgeon: Melina Schools, MD;  Location: [facility];  Service: Orthopedics;  Laterality: N/A;  5 hrs  . LYMPHADENECTOMY  05/03/2012   Procedure: LYMPHADENECTOMY;  Surgeon: Alvino Chapel, MD;  Location: [facility];  Service: Gynecology;  Laterality: Bilateral;  pelvic  . RADICAL HYSTERECTOMY  05/03/2012   Procedure: RADICAL HYSTERECTOMY;  Surgeon: Alvino Chapel, MD;  Location: [facility];  Service: Gynecology;;  transposition of the ovaries  . SPINE SURGERY    . TUBAL LIGATION      There were no vitals filed for this visit.   Subjective Assessment - 11/18/20 1637    Subjective Patient states her back has been fine so far today. She reports some knee pain with bending and notes some difficulty doing steps today.    Pertinent History lumbar fusion 2019    Limitations Lifting;House hold activities;Standing;Walking  Patient Stated Goals Be more mobile with less pain    Currently in Pain? Yes    Pain Score 3     Pain Location Knee    Pain Orientation Left    Pain Descriptors / Indicators Stabbing    Pain Type Chronic pain    Pain Onset More than a month ago    Pain Frequency Constant                         Adult Aquatic Therapy - 11/18/20 1644      Treatment   Gait Dynamic warmup: pool walking, sidestepping, retro walking 3RT each    Exercises hip abduction/ extension 2 x10 each, single arm DB pulll downs 2 x10, double  arm dumbbell pull downs x20                        PT Short Term Goals - 11/11/20 1622      PT SHORT TERM GOAL #1   Title Patient will be independent in self management strategies to improve quality of life and functional outcomes.    Time 2    Period Weeks    Status New    Target Date 11/25/20             PT Long Term Goals - 11/11/20 1623      PT LONG TERM GOAL #1   Title Patient will improve FOTO score by 10% to indicate improvement in functional outcomes    Time 4    Period Weeks    Status New    Target Date 12/09/20      PT LONG TERM GOAL #2   Title Patient will have equal to or > 4/5 MMT throughout BLE to improve ability to perform functional mobility, stair ambulation and ADLs.    Time 4    Period Weeks    Status New    Target Date 12/09/20      PT LONG TERM GOAL #3   Title Patient will report at least 70% overall improvement in subjective complaint to indicate improvement in ability to perform ADLs.    Time 4    Period Weeks    Status New    Target Date 12/09/20      PT LONG TERM GOAL #4   Title Patient will be able to stand 3-4 hours with pain not to exceed 3/10 in lumbar for improved tolerance to work tasks and ADLs.    Time 4    Period Weeks    Status New    Target Date 12/09/20                 Plan - 11/18/20 1639    Clinical Impression Statement Initiated aquatic therapy. Patient tolerated session well. Began with core strengthening and progressed to LE strengthening. Patient cued on purpose and function of all added exercises. Activity limited due to pool closure per inclement weather policy. Will resume scheduled activity as able. Patient will continue to benefit from skilled therapy services to reduce limitations and improve functional ability.    Personal Factors and Comorbidities Comorbidity 1    Comorbidities lumbar fusion in 2019    Examination-Activity Limitations Squat;Sleep;Stand;Lift;Locomotion Level;Bend     Examination-Participation Restrictions Occupation;Meal Prep;Shop    Stability/Clinical Decision Making Stable/Uncomplicated    Rehab Potential Good    PT Frequency 3x / week    PT Duration 4 weeks    PT Treatment/Interventions  ADLs/Self Care Home Management;Aquatic Therapy;Cryotherapy;Electrical Stimulation;Moist Heat;Traction;Balance training;Therapeutic exercise;Therapeutic activities;Functional mobility training;Stair training;Gait training;DME Instruction;Neuromuscular re-education;Patient/family education;Manual techniques;Dry needling;Biofeedback;Passive range of motion;Vasopneumatic Device;Spinal Manipulations;Joint Manipulations;Energy conservation;Splinting;Taping;Orthotic Fit/Training;Scar mobilization;Canalith Repostioning;Ultrasound;Fluidtherapy;Contrast Bath    PT Next Visit Plan Review goals. Progress core and glute strength as tolerated. Manual as needed for pain.    PT Home Exercise Plan Eval: SLR, ab set, ab march, sidelying hip abduction    Consulted and Agree with Plan of Care Patient           Patient will benefit from skilled therapeutic intervention in order to improve the following deficits and impairments:  Pain,Decreased strength,Decreased activity tolerance,Decreased mobility,Hypomobility  Visit Diagnosis: Low back pain, unspecified back pain laterality, unspecified chronicity, unspecified whether sciatica present  Left knee pain, unspecified chronicity  Muscle weakness (generalized)     Problem List Patient Active Problem List   Diagnosis Date Noted  . Annual visit for general adult medical examination with abnormal findings 09/10/2020  . 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  . Anxiety with depression 06/30/2020  . Prehypertension 06/26/2020  . Acute low back pain 05/21/2020  . Tachycardia determined by examination of pulse 05/17/2020  . Right tennis elbow 02/21/2020  . Vaginal Pap smear following hysterectomy for malignancy 12/18/2019  . Environmental  and seasonal allergies 09/29/2019  . Nicotine dependence 09/29/2019  . Binge eating 09/29/2019  . Migraine 09/29/2019  . Bilateral carpal tunnel syndrome 09/29/2019  . Morbid obesity ([place]) 09/29/2019  . Other fatigue 09/29/2019  . Vitamin D deficiency 09/29/2019   4:48 PM, 11/18/20 Josue Hector PT DPT  Physical Therapist with [facility]  [phone] [place] [address] [place], [place], [address] Phone: [phone]   Fax:  [phone]  Name: April Ayala MRN: [id number] Date of Birth: Dec 10, 1975

## 2020-11-20 ENCOUNTER — Ambulatory Visit (HOSPITAL_COMMUNITY): Payer: BC Managed Care – PPO | Admitting: Physical Therapy

## 2020-11-21 ENCOUNTER — Telehealth: Payer: BC Managed Care – PPO | Admitting: Nurse Practitioner

## 2020-11-21 DIAGNOSIS — J0100 Acute maxillary sinusitis, unspecified: Secondary | ICD-10-CM

## 2020-11-21 MED ORDER — AMOXICILLIN-POT CLAVULANATE 875-125 MG PO TABS: 1.0000 | ORAL_TABLET | Freq: Two times a day (BID) | ORAL | 0 refills | Status: DC

## 2020-11-21 NOTE — Progress Notes (Signed)

## 2020-11-22 ENCOUNTER — Ambulatory Visit (HOSPITAL_COMMUNITY): Payer: BC Managed Care – PPO

## 2020-11-25 ENCOUNTER — Telehealth (HOSPITAL_COMMUNITY): Payer: Self-pay | Admitting: Physical Therapy

## 2020-11-25 ENCOUNTER — Encounter: Payer: Self-pay | Admitting: Family Medicine

## 2020-11-25 ENCOUNTER — Ambulatory Visit (HOSPITAL_COMMUNITY): Payer: BC Managed Care – PPO | Admitting: Physical Therapy

## 2020-11-25 NOTE — Telephone Encounter (Signed)
Patient called she can not come today

## 2020-11-26 ENCOUNTER — Other Ambulatory Visit: Payer: Self-pay

## 2020-11-26 DIAGNOSIS — G43809 Other migraine, not intractable, without status migrainosus: Secondary | ICD-10-CM

## 2020-11-26 DIAGNOSIS — F419 Anxiety disorder, unspecified: Secondary | ICD-10-CM

## 2020-11-26 DIAGNOSIS — E7841 Elevated Lipoprotein(a): Secondary | ICD-10-CM

## 2020-11-26 MED ORDER — BUSPIRONE HCL 5 MG PO TABS: 5.0000 mg | ORAL_TABLET | Freq: Two times a day (BID) | ORAL | 0 refills | Status: DC

## 2020-11-26 MED ORDER — AMITRIPTYLINE HCL 75 MG PO TABS: 75.0000 mg | ORAL_TABLET | Freq: Every day | ORAL | 1 refills | Status: DC

## 2020-11-26 MED ORDER — ROSUVASTATIN CALCIUM 5 MG PO TABS: 5.0000 mg | ORAL_TABLET | Freq: Every day | ORAL | 1 refills | Status: DC

## 2020-11-27 ENCOUNTER — Ambulatory Visit (HOSPITAL_COMMUNITY): Payer: BC Managed Care – PPO | Admitting: Physical Therapy

## 2020-11-27 ENCOUNTER — Telehealth (HOSPITAL_COMMUNITY): Payer: Self-pay | Admitting: Physical Therapy

## 2020-11-27 NOTE — Telephone Encounter (Signed)
Pt did not show for sceduled appointment.  Called and left message regarding missed appointment and reminder for next one this Friday.  Also explained the NS policy.  Teena Irani, PTA/CLT [phone]

## 2020-11-29 ENCOUNTER — Ambulatory Visit (HOSPITAL_COMMUNITY): Payer: BC Managed Care – PPO

## 2020-11-29 DIAGNOSIS — J84116 Cryptogenic organizing pneumonia: Secondary | ICD-10-CM | POA: Diagnosis not present

## 2020-11-29 DIAGNOSIS — J9691 Respiratory failure, unspecified with hypoxia: Secondary | ICD-10-CM | POA: Diagnosis not present

## 2020-12-03 ENCOUNTER — Ambulatory Visit (HOSPITAL_COMMUNITY): Payer: BC Managed Care – PPO | Admitting: Physical Therapy

## 2020-12-04 ENCOUNTER — Telehealth: Payer: BC Managed Care – PPO | Admitting: Physician Assistant

## 2020-12-04 DIAGNOSIS — R3989 Other symptoms and signs involving the genitourinary system: Secondary | ICD-10-CM

## 2020-12-04 MED ORDER — CEPHALEXIN 500 MG PO CAPS: 500.0000 mg | ORAL_CAPSULE | Freq: Two times a day (BID) | ORAL | 0 refills | Status: DC

## 2020-12-04 NOTE — Progress Notes (Signed)
We are sorry that you are not feeling well.  Here is how we plan to help!  Based on what you shared with me it looks like you most likely have a simple urinary tract infection.  A UTI (Urinary Tract Infection) is a bacterial infection of the bladder.  Most cases of urinary tract infections are simple to treat but a key part of your care is to encourage you to drink plenty of fluids and watch your symptoms carefully.  I have prescribed Keflex 500 mg twice a day for 7 days.  Your symptoms should gradually improve. Call us if the burning in your urine worsens, you develop worsening fever, back pain or pelvic pain or if your symptoms do not resolve after completing the antibiotic.  Urinary tract infections can be prevented by drinking plenty of water to keep your body hydrated.  Also be sure when you wipe, wipe from front to back and don't hold it in!  If possible, empty your bladder every 4 hours.  Your e-visit answers were reviewed by a board certified advanced clinical practitioner to complete your personal care plan.  Depending on the condition, your plan could have included both over the counter or prescription medications.  If there is a problem please reply  once you have received a response from your provider.  Your safety is important to [place].  If you have drug allergies check your prescription carefully.    You can use MyChart to ask questions about today's visit, request a non-urgent call back, or ask for a work or school excuse for 24 hours related to this e-Visit. If it has been greater than 24 hours you will need to follow up with your provider, or enter a new e-Visit to address those concerns.   You will get an e-mail in the next two days asking about your experience.  I hope that your e-visit has been valuable and will speed your recovery. Thank you for using e-visits.  I provided 5 minutes of non face-to-face time during this encounter for chart review and documentation.

## 2020-12-05 ENCOUNTER — Encounter (HOSPITAL_COMMUNITY): Payer: BC Managed Care – PPO | Admitting: Physical Therapy

## 2020-12-06 ENCOUNTER — Encounter (HOSPITAL_COMMUNITY): Payer: BC Managed Care – PPO

## 2020-12-06 ENCOUNTER — Telehealth (HOSPITAL_COMMUNITY): Payer: Self-pay

## 2020-12-06 ENCOUNTER — Encounter: Payer: Self-pay | Admitting: Family Medicine

## 2020-12-06 NOTE — Telephone Encounter (Signed)
2nd consecutive no show.  Called and left message concerning missed apt today.  Reminded next apt date and time.  Reviewed no show policy details and cancelled remaining apts except next apt.  Pt will need to schedule 1 apt at a time following.    Ihor Austin, LPTA/CLT; Delana Meyer [phone]

## 2020-12-09 ENCOUNTER — Encounter (HOSPITAL_COMMUNITY): Payer: Self-pay | Admitting: Physical Therapy

## 2020-12-09 ENCOUNTER — Ambulatory Visit (HOSPITAL_COMMUNITY): Payer: Medicaid Other | Attending: Orthopedic Surgery | Admitting: Physical Therapy

## 2020-12-09 ENCOUNTER — Other Ambulatory Visit (HOSPITAL_COMMUNITY): Payer: Self-pay | Admitting: Nurse Practitioner

## 2020-12-09 DIAGNOSIS — M545 Low back pain, unspecified: Secondary | ICD-10-CM | POA: Insufficient documentation

## 2020-12-09 DIAGNOSIS — M25562 Pain in left knee: Secondary | ICD-10-CM | POA: Insufficient documentation

## 2020-12-09 DIAGNOSIS — M6281 Muscle weakness (generalized): Secondary | ICD-10-CM | POA: Insufficient documentation

## 2020-12-09 NOTE — Therapy (Signed)
[place], [place], [address] Phone: [phone]   Fax:  [phone]  Physical Therapy Treatment  Patient Details  Name: April Ayala MRN: [id number] Date of Birth: [birth year] Referring Provider (PT): Melina Schools MD   Encounter Date: 12/09/2020   PT End of Session - 12/09/20 1742    Visit Number 3    Number of Visits 12    Date for PT Re-Evaluation 12/09/20    Authorization Type WC    Progress Note Due on Visit 6    PT Start Time 1400    PT Stop Time 1445    PT Time Calculation (min) 45 min    Activity Tolerance Patient tolerated treatment well    Behavior During Therapy [facility] for tasks assessed/performed           Past Medical History:  Diagnosis Date  . Angular cheilitis 07/17/2020  . Anxiety   . BOOP (bronchiolitis obliterans with organizing pneumonia) ([place]) 07/09/2020  . Cervical cancer ([place]) 04/2011   Stage 1B squamous cell  . Chronic left shoulder pain 10/07/2017  . Chronic tension-type headache, not intractable 10/07/2017  . Class 2 obesity 09/29/2019  . Cold sore 04/11/2020  . Depression   . Encounter for screening mammogram for malignant neoplasm of breast 09/29/2019  . Encounter for support and coordination of transition of care 07/09/2020  . Family history of diabetes mellitus 09/29/2019  . Family history of hyperlipidemia 09/29/2019  . Family history of hypothyroidism 09/29/2019  . High cholesterol   . History of cervical cancer 10/27/2019  . History of kidney stones    passed  . Prehypertension 06/26/2020  . Respiratory failure with hypoxia ([place]) 06/30/2020  . S/P lumbar fusion 04/20/2018  . Screening for colorectal cancer 12/18/2019  . Soreness of tongue 04/23/2020    Past Surgical History:  Procedure Laterality Date  . ABDOMINAL HYSTERECTOMY    . BREAST BIOPSY Right   . CERVICAL CONIZATION W/BX  03/24/2012   Procedure: CONIZATION CERVIX WITH BIOPSY;  Surgeon: Woodroe Mode, MD;   Location: [facility];  Service: Gynecology;  Laterality: N/A;  . DIAGNOSTIC LAPAROSCOPY  2007   ectopic  . ECTOPIC PREGNANCY SURGERY    . ESSURE TUBAL LIGATION  2010  . INSERTION OF SUPRAPUBIC CATHETER  05/03/2012   Procedure: INSERTION OF SUPRAPUBIC CATHETER;  Surgeon: Alvino Chapel, MD;  Location: [facility];  Service: Gynecology;;  . LEEP    . LUMBAR LAMINECTOMY/DECOMPRESSION MICRODISCECTOMY N/A 04/20/2018   Procedure: LUMBAR FIVE TO SACRAL ONE DECOMPRESSION AND FUSION, POSSIBLE LUMBAR FOUR TO SACRAL ONE; SCREW AND CAGE INSTRUMENTATION;  Surgeon: Melina Schools, MD;  Location: [facility];  Service: Orthopedics;  Laterality: N/A;  5 hrs  . LYMPHADENECTOMY  05/03/2012   Procedure: LYMPHADENECTOMY;  Surgeon: Alvino Chapel, MD;  Location: [facility];  Service: Gynecology;  Laterality: Bilateral;  pelvic  . RADICAL HYSTERECTOMY  05/03/2012   Procedure: RADICAL HYSTERECTOMY;  Surgeon: Alvino Chapel, MD;  Location: [facility];  Service: Gynecology;;  transposition of the ovaries  . SPINE SURGERY    . TUBAL LIGATION      There were no vitals filed for this visit.   Subjective Assessment - 12/09/20 1740    Subjective Patient states she is "doing ok". She still has back pain with walking long distance and bending dawn.    Pertinent History lumbar fusion 2019    Limitations Lifting;House hold activities;Standing;Walking    Patient Stated Goals Be more mobile  with less pain    Currently in Pain? Yes    Pain Score 3     Pain Location Back    Pain Orientation Posterior    Pain Descriptors / Indicators Aching    Pain Type Chronic pain    Pain Onset More than a month ago    Pain Frequency Intermittent                         Adult Aquatic Therapy - 12/09/20 1746      Treatment   Gait Dynamic warmup: pool walking, sidestepping, retro walking 3RT each    Exercises heel raise 3 x 10, hamstring curls 3 x 10, hip abduction 3 x 10, mini squats 3 x 10, single arm DB pull  down x20, DB crossover x20, alternating DB row in single leg stance 3 x 20" each                        PT Short Term Goals - 11/11/20 1622      PT SHORT TERM GOAL #1   Title Patient will be independent in self management strategies to improve quality of life and functional outcomes.    Time 2    Period Weeks    Status New    Target Date 11/25/20             PT Long Term Goals - 11/11/20 1623      PT LONG TERM GOAL #1   Title Patient will improve FOTO score by 10% to indicate improvement in functional outcomes    Time 4    Period Weeks    Status New    Target Date 12/09/20      PT LONG TERM GOAL #2   Title Patient will have equal to or > 4/5 MMT throughout BLE to improve ability to perform functional mobility, stair ambulation and ADLs.    Time 4    Period Weeks    Status New    Target Date 12/09/20      PT LONG TERM GOAL #3   Title Patient will report at least 70% overall improvement in subjective complaint to indicate improvement in ability to perform ADLs.    Time 4    Period Weeks    Status New    Target Date 12/09/20      PT LONG TERM GOAL #4   Title Patient will be able to stand 3-4 hours with pain not to exceed 3/10 in lumbar for improved tolerance to work tasks and ADLs.    Time 4    Period Weeks    Status New    Target Date 12/09/20                 Plan - 12/09/20 1742    Clinical Impression Statement Patient tolerates session well today with no increased complaint of pain in gravity reduced setting. Patient will good return with prior ther ex, but did require verbal cues for squat mechanics and ROM during hip abduction. Patient will continue to benefit from skilled therapy services to reduce deficits and improve functional level.    Personal Factors and Comorbidities Comorbidity 1    Comorbidities lumbar fusion in 2019    Examination-Activity Limitations Squat;Sleep;Stand;Lift;Locomotion Level;Bend    Examination-Participation  Restrictions Occupation;Meal Prep;Shop    Stability/Clinical Decision Making Stable/Uncomplicated    Rehab Potential Good    PT Frequency 3x / week  PT Duration 4 weeks    PT Treatment/Interventions ADLs/Self Care Home Management;Aquatic Therapy;Cryotherapy;Electrical Stimulation;Moist Heat;Traction;Balance training;Therapeutic exercise;Therapeutic activities;Functional mobility training;Stair training;Gait training;DME Instruction;Neuromuscular re-education;Patient/family education;Manual techniques;Dry needling;Biofeedback;Passive range of motion;Vasopneumatic Device;Spinal Manipulations;Joint Manipulations;Energy conservation;Splinting;Taping;Orthotic Fit/Training;Scar mobilization;Canalith Repostioning;Ultrasound;Fluidtherapy;Contrast Bath    PT Next Visit Plan Reassess next visit.  Progress core and glute strength as tolerated. Manual as needed for pain.    PT Home Exercise Plan Eval: SLR, ab set, ab march, sidelying hip abduction    Consulted and Agree with Plan of Care Patient           Patient will benefit from skilled therapeutic intervention in order to improve the following deficits and impairments:  Pain,Decreased strength,Decreased activity tolerance,Decreased mobility,Hypomobility  Visit Diagnosis: Low back pain, unspecified back pain laterality, unspecified chronicity, unspecified whether sciatica present  Left knee pain, unspecified chronicity  Muscle weakness (generalized)     Problem List Patient Active Problem List   Diagnosis Date Noted  . Annual visit for general adult medical examination with abnormal findings 09/10/2020  . 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  . Anxiety with depression 06/30/2020  . Prehypertension 06/26/2020  . Acute low back pain 05/21/2020  . Tachycardia determined by examination of pulse 05/17/2020  . Right tennis elbow 02/21/2020  . Vaginal Pap smear following hysterectomy for malignancy 12/18/2019  . Environmental and seasonal  allergies 09/29/2019  . Nicotine dependence 09/29/2019  . Binge eating 09/29/2019  . Migraine 09/29/2019  . Bilateral carpal tunnel syndrome 09/29/2019  . Morbid obesity ([place]) 09/29/2019  . Other fatigue 09/29/2019  . Vitamin D deficiency 09/29/2019   5:49 PM, 12/09/20 Josue Hector PT DPT  Physical Therapist with [facility]  [phone] [place] [address] [place], [place], [address] Phone: [phone]   Fax:  [phone]  Name: April Ayala MRN: [id number] Date of Birth: 1976/04/04

## 2020-12-10 ENCOUNTER — Other Ambulatory Visit (HOSPITAL_COMMUNITY): Payer: Self-pay | Admitting: Family Medicine

## 2020-12-10 DIAGNOSIS — Z1231 Encounter for screening mammogram for malignant neoplasm of breast: Secondary | ICD-10-CM

## 2020-12-11 ENCOUNTER — Other Ambulatory Visit: Payer: Self-pay

## 2020-12-11 ENCOUNTER — Encounter (HOSPITAL_COMMUNITY): Payer: BC Managed Care – PPO

## 2020-12-11 ENCOUNTER — Encounter (HOSPITAL_COMMUNITY): Payer: Self-pay | Admitting: Physical Therapy

## 2020-12-11 ENCOUNTER — Ambulatory Visit (HOSPITAL_COMMUNITY): Payer: Medicaid Other | Admitting: Physical Therapy

## 2020-12-11 DIAGNOSIS — M6281 Muscle weakness (generalized): Secondary | ICD-10-CM

## 2020-12-11 DIAGNOSIS — M25562 Pain in left knee: Secondary | ICD-10-CM

## 2020-12-11 DIAGNOSIS — Z1231 Encounter for screening mammogram for malignant neoplasm of breast: Secondary | ICD-10-CM

## 2020-12-11 DIAGNOSIS — M545 Low back pain, unspecified: Secondary | ICD-10-CM

## 2020-12-11 NOTE — Therapy (Signed)
[facility] [address] [place], [place], [address] Phone: [phone]   Fax:  [phone]  Physical Therapy Treatment  Patient Details  Name: April Ayala MRN: [id number] Date of Birth: [birth year] Referring Provider (PT): Melina Schools MD  Progress Note Reporting Period 11/11/20 to 12/11/20  See note below for Objective Data and Assessment of Progress/Goals.      Encounter Date: 12/11/2020   PT End of Session - 12/11/20 1449    Visit Number 4    Number of Visits 12    Date for PT Re-Evaluation 01/08/21    Authorization Type WC    Progress Note Due on Visit 12    PT Start Time 1438    PT Stop Time 1520    PT Time Calculation (min) 42 min    Activity Tolerance Patient tolerated treatment well    Behavior During Therapy WFL for tasks assessed/performed           Past Medical History:  Diagnosis Date  . Angular cheilitis 07/17/2020  . Anxiety   . BOOP (bronchiolitis obliterans with organizing pneumonia) ([facility]) 07/09/2020  . Cervical cancer ([facility]) 04/2011   Stage 1B squamous cell  . Chronic left shoulder pain 10/07/2017  . Chronic tension-type headache, not intractable 10/07/2017  . Class 2 obesity 09/29/2019  . Cold sore 04/11/2020  . Depression   . Encounter for screening mammogram for malignant neoplasm of breast 09/29/2019  . Encounter for support and coordination of transition of care 07/09/2020  . Family history of diabetes mellitus 09/29/2019  . Family history of hyperlipidemia 09/29/2019  . Family history of hypothyroidism 09/29/2019  . High cholesterol   . History of cervical cancer 10/27/2019  . History of kidney stones    passed  . Prehypertension 06/26/2020  . Respiratory failure with hypoxia ([facility] [facility]) 06/30/2020  . S/P lumbar fusion 04/20/2018  . Screening for colorectal cancer 12/18/2019  . Soreness of tongue 04/23/2020    Past Surgical History:  Procedure Laterality Date  . ABDOMINAL HYSTERECTOMY    . BREAST BIOPSY  Right   . CERVICAL CONIZATION W/BX  03/24/2012   Procedure: CONIZATION CERVIX WITH BIOPSY;  Surgeon: Woodroe Mode, MD;  Location: [facility];  Service: Gynecology;  Laterality: N/A;  . DIAGNOSTIC LAPAROSCOPY  2007   ectopic  . ECTOPIC PREGNANCY SURGERY    . ESSURE TUBAL LIGATION  2010  . INSERTION OF SUPRAPUBIC CATHETER  05/03/2012   Procedure: INSERTION OF SUPRAPUBIC CATHETER;  Surgeon: Alvino Chapel, MD;  Location: [facility];  Service: Gynecology;;  . LEEP    . LUMBAR LAMINECTOMY/DECOMPRESSION MICRODISCECTOMY N/A 04/20/2018   Procedure: LUMBAR FIVE TO SACRAL ONE DECOMPRESSION AND FUSION, POSSIBLE LUMBAR FOUR TO SACRAL ONE; SCREW AND CAGE INSTRUMENTATION;  Surgeon: Melina Schools, MD;  Location: [facility];  Service: Orthopedics;  Laterality: N/A;  5 hrs  . LYMPHADENECTOMY  05/03/2012   Procedure: LYMPHADENECTOMY;  Surgeon: Alvino Chapel, MD;  [facility];  Service: Gynecology;  Laterality: Bilateral;  pelvic  . RADICAL HYSTERECTOMY  05/03/2012   Procedure: RADICAL HYSTERECTOMY;  Surgeon: Alvino Chapel, MD;  [facility];  Service: Gynecology;;  transposition of the ovaries  . SPINE SURGERY    . TUBAL LIGATION      There were no vitals filed for this visit.   Subjective Assessment - 12/11/20 1446    Subjective Patient says she feels aquatic therapy has been very helpful. Land therapy is still difficult. Her back is making some  improvement. She is still having trouble with her knee. She reports about a 30% improvement since starting therapy.    Pertinent History lumbar fusion 2019    Limitations Lifting;House hold activities;Standing;Walking    How long can you stand comfortably? 1-1.5 hours    Patient Stated Goals Be more mobile with less pain    Currently in Pain? Yes    Pain Score 5     Pain Location Back    Pain Orientation Posterior;Lower    Pain Descriptors / Indicators Dull;Numbness    Pain Type Chronic pain    Pain Onset More than a month ago     Pain Frequency Intermittent    Aggravating Factors  bending, lifting, walking    Pain Relieving Factors Meds    Effect of Pain on Daily Activities Limits              [facility] PT Assessment - 12/11/20 0001      Assessment   Medical Diagnosis low back pain    Referring Provider (PT) Melina Schools MD    Prior [place] residence      Prior Function   Level of Independence Independent    Vocation Workers comp      Cognition   Overall Cognitive Status Within Functional Limits for tasks assessed      Observation/Other Assessments   Focus on Therapeutic Outcomes (FOTO)  50% function   was 56%     AROM   Lumbar Flexion WFL    Lumbar Extension 60% limited   was 75% limited   Lumbar - Right Side Bend 25% limited   was 50% limited   Lumbar - Left Side Bend 25% limited   was 50% limited     Strength   Right Hip Flexion 4+/5    Right Hip Extension 4/5   was 3-   Right Hip ABduction 4/5    Left Hip Flexion 4+/5    Left Hip Extension 4/5   through available ROM   Left Hip ABduction 4/5   was 3+   Right Knee Extension 5/5    Left Knee Extension 4+/5                         [facility] Adult PT Treatment/Exercise - 12/11/20 0001      Exercises   Exercises Lumbar      Lumbar Exercises: Supine   Ab Set 15 reps;5 seconds    Bent Knee Raise 20 reps    Bridge 15 reps;5 seconds      Lumbar Exercises: Sidelying   Hip Abduction 20 reps;Both                    PT Short Term Goals - 12/11/20 1600      PT SHORT TERM GOAL #1   Title Patient will be independent in self management strategies to improve quality of life and functional outcomes.    Time 2    Period Weeks    Status On-going    Target Date 11/25/20             PT Long Term Goals - 12/11/20 1600      PT LONG TERM GOAL #1   Title Patient will improve FOTO score by 10% to indicate improvement in functional outcomes    Time 4    Period  Weeks    Status On-going  PT LONG TERM GOAL #2   Title Patient will have equal to or > 4/5 MMT throughout BLE to improve ability to perform functional mobility, stair ambulation and ADLs.    Baseline See MMT    Time 4    Period Weeks    Status Achieved      PT LONG TERM GOAL #3   Title Patient will report at least 70% overall improvement in subjective complaint to indicate improvement in ability to perform ADLs.    Baseline Reports 60%    Time 4    Period Weeks    Status On-going      PT LONG TERM GOAL #4   Title Patient will be able to stand 3-4 hours with pain not to exceed 3/10 in lumbar for improved tolerance to work tasks and ADLs.    Baseline Reports 1-1.5 hours    Time 4    Period Weeks    Status On-going                 Plan - 12/11/20 1552    Clinical Impression Statement Patient is making steady improvements with therapy despite attendance issues. Patient showing some improvement in lumbar ROM, as well as LE strength. Patient remains limited by increased pain and decreased activity tolerance. Patient will continue to benefit from skilled therapy services to reduce deficits and improve functional ability.    Personal Factors and Comorbidities Comorbidity 1    Comorbidities lumbar fusion in 2019    Examination-Activity Limitations Squat;Sleep;Stand;Lift;Locomotion Level;Bend    Examination-Participation Restrictions Occupation;Meal Prep;Shop    Stability/Clinical Decision Making Stable/Uncomplicated    Rehab Potential Good    PT Frequency 2x / week    PT Duration 4 weeks    PT Treatment/Interventions ADLs/Self Care Home Management;Aquatic Therapy;Cryotherapy;Electrical Stimulation;Moist Heat;Traction;Balance training;Therapeutic exercise;Therapeutic activities;Functional mobility training;Stair training;Gait training;DME Instruction;Neuromuscular re-education;Patient/family education;Manual techniques;Dry needling;Biofeedback;Passive range of  motion;Vasopneumatic Device;Spinal Manipulations;Joint Manipulations;Energy conservation;Splinting;Taping;Orthotic Fit/Training;Scar mobilization;Canalith Repostioning;Ultrasound;Fluidtherapy;Contrast Bath    PT Next Visit Plan Progress core and glute strength as tolerated. Manual as needed for pain.    PT Home Exercise Plan Eval: SLR, ab set, ab march, sidelying hip abduction 6/8 clam, bridge    Consulted and Agree with Plan of Care Patient           Patient will benefit from skilled therapeutic intervention in order to improve the following deficits and impairments:  Pain,Decreased strength,Decreased activity tolerance,Decreased mobility,Hypomobility  Visit Diagnosis: Low back pain, unspecified back pain laterality, unspecified chronicity, unspecified whether sciatica present  Left knee pain, unspecified chronicity  Muscle weakness (generalized)     Problem List Patient Active Problem List   Diagnosis Date Noted  . Annual visit for general adult medical examination with abnormal findings 09/10/2020  . 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  . Anxiety with depression 06/30/2020  . Prehypertension 06/26/2020  . Acute low back pain 05/21/2020  . Tachycardia determined by examination of pulse 05/17/2020  . Right tennis elbow 02/21/2020  . Vaginal Pap smear following hysterectomy for malignancy 12/18/2019  . Environmental and seasonal allergies 09/29/2019  . Nicotine dependence 09/29/2019  . Binge eating 09/29/2019  . Migraine 09/29/2019  . Bilateral carpal tunnel syndrome 09/29/2019  . Morbid obesity ([place]) 09/29/2019  . Other fatigue 09/29/2019  . Vitamin D deficiency 09/29/2019    4:01 PM, 12/11/20 Josue Hector PT DPT  Physical Therapist with [facility]  [phone] [facility] [address] [place], [place], [address] Phone:  [phone]   Fax:  [phone]  Name: KERSTI SCAVONE MRN:  [id number] Date of Birth: Nov 02, 1975

## 2020-12-13 ENCOUNTER — Encounter (HOSPITAL_COMMUNITY): Payer: BC Managed Care – PPO

## 2020-12-16 ENCOUNTER — Ambulatory Visit (HOSPITAL_COMMUNITY): Payer: Medicaid Other | Admitting: Physical Therapy

## 2020-12-16 ENCOUNTER — Ambulatory Visit (HOSPITAL_COMMUNITY): Payer: BC Managed Care – PPO | Admitting: Physical Therapy

## 2020-12-18 ENCOUNTER — Ambulatory Visit (INDEPENDENT_AMBULATORY_CARE_PROVIDER_SITE_OTHER): Payer: BC Managed Care – PPO | Admitting: Orthopaedic Surgery

## 2020-12-18 ENCOUNTER — Other Ambulatory Visit: Payer: BC Managed Care – PPO | Admitting: Adult Health

## 2020-12-18 ENCOUNTER — Other Ambulatory Visit: Payer: Self-pay

## 2020-12-18 ENCOUNTER — Encounter (HOSPITAL_COMMUNITY): Payer: BC Managed Care – PPO | Admitting: Physical Therapy

## 2020-12-18 ENCOUNTER — Encounter: Payer: Self-pay | Admitting: Orthopaedic Surgery

## 2020-12-18 DIAGNOSIS — G5603 Carpal tunnel syndrome, bilateral upper limbs: Secondary | ICD-10-CM | POA: Diagnosis not present

## 2020-12-18 NOTE — Progress Notes (Signed)
Office Visit Note   Patient: April Ayala           Date of Birth: 20-Feb-1976           MRN: [id number] Visit Date: 12/18/2020              Requested by: Perlie Mayo, NP [address] [place],  [place] [address] PCP: Perlie Mayo, NP   Assessment & Plan: Visit Diagnoses:  1. Bilateral carpal tunnel syndrome     Plan: 2 months status post left carpal tunnel release and very happy with the results.  No longer has any numbness or tingling or pain.  Incision is healed nicely.  She like to proceed with right carpal tunnel release with prior EMGs and nerve conduction studies consistent with bilateral carpal tunnel syndrome.  Studies were performed in 2019 revealed a severe carpal tunnel on the left and moderate to severe on the right.  She is fully aware that she may not have the same results that she did on the left hand with the right but she would like to proceed  Follow-Up Instructions: Return We will schedule right carpal tunnel release.   Orders:  No orders of the defined types were placed in this encounter.  No orders of the defined types were placed in this encounter.     Procedures: No procedures performed   Clinical Data: No additional findings.   Subjective: No chief complaint on file. 2 months status post left carpal tunnel release and very happy the results with no longer having numbness and tingling or pain in her left hand.  She is having symptoms of carpal tunnel on the right with prior studies performed in 2019 revealing moderate to severe carpal tunnel or median nerve compression.  She like to proceed with right carpal tunnel release  HPI  Review of Systems   Objective: Vital Signs: LMP 04/11/2012   Physical Exam Constitutional:      Appearance: She is well-developed.  Eyes:     Pupils: Pupils are equal, round, and reactive to light.  Pulmonary:     Effort: Pulmonary effort is normal.  Skin:    General: Skin is warm and dry.   Neurological:     Mental Status: She is alert and oriented to person, place, and time.  Psychiatric:        Behavior: Behavior normal.    Ortho Exam left carpal tunnel incision with a little bit of firm scar but no pain.  Good opposition of thumb little finger.  Full fist and neurologically intact.  Positive Tinel and Phalen's over the median nerve of the right hand.  Good opposition of thumb the little finger no obvious atrophy of thenar musculature.  Neurologically intact  Specialty Comments:  No specialty comments available.  Imaging: No results found.   PMFS History: Patient Active Problem List   Diagnosis Date Noted   Annual visit for general adult medical examination with abnormal findings 09/10/2020   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   Anxiety with depression 06/30/2020   Prehypertension 06/26/2020   Acute low back pain 05/21/2020   Tachycardia determined by examination of pulse 05/17/2020   Right tennis elbow 02/21/2020   Vaginal Pap smear following hysterectomy for malignancy 12/18/2019   Environmental and seasonal allergies 09/29/2019   Nicotine dependence 09/29/2019   Binge eating 09/29/2019   Migraine 09/29/2019   Bilateral carpal tunnel syndrome 09/29/2019   Morbid obesity ([facility]) 09/29/2019   Other fatigue 09/29/2019   Vitamin  D deficiency 09/29/2019   Past Medical History:  Diagnosis Date   Angular cheilitis 07/17/2020   Anxiety    BOOP (bronchiolitis obliterans with organizing pneumonia) ([facility]) 07/09/2020   Cervical cancer ([facility] [facility]) 04/2011   Stage 1B squamous cell   Chronic left shoulder pain 10/07/2017   Chronic tension-type headache, not intractable 10/07/2017   Class 2 obesity 09/29/2019   Cold sore 04/11/2020   Depression    Encounter for screening mammogram for malignant neoplasm of breast 09/29/2019   Encounter for support and coordination of transition of care 07/09/2020   Family history of diabetes mellitus 09/29/2019   Family history of hyperlipidemia  09/29/2019   Family history of hypothyroidism 09/29/2019   High cholesterol    History of cervical cancer 10/27/2019   History of kidney stones    passed   Prehypertension 06/26/2020   Respiratory failure with hypoxia ([facility]) 06/30/2020   S/P lumbar fusion 04/20/2018   Screening for colorectal cancer 12/18/2019   Soreness of tongue 04/23/2020    Family History  Problem Relation Age of Onset   Diabetes Mother    Hypertension Father    Diabetes Sister    Migraines Sister    Hypothyroidism Sister    Migraines Sister    Migraines Sister     Past Surgical History:  Procedure Laterality Date   ABDOMINAL HYSTERECTOMY     BREAST BIOPSY Right    CERVICAL CONIZATION W/BX  03/24/2012   Procedure: CONIZATION CERVIX WITH BIOPSY;  Surgeon: Woodroe Mode, MD;  Location: [facility];  Service: Gynecology;  Laterality: N/A;   DIAGNOSTIC LAPAROSCOPY  2007   ectopic   ECTOPIC PREGNANCY SURGERY     ESSURE TUBAL LIGATION  2010   INSERTION OF SUPRAPUBIC CATHETER  05/03/2012   Procedure: INSERTION OF SUPRAPUBIC CATHETER;  Surgeon: Alvino Chapel, MD;  [facility];  Service: Gynecology;;   LEEP     LUMBAR LAMINECTOMY/DECOMPRESSION MICRODISCECTOMY N/A 04/20/2018   Procedure: LUMBAR FIVE TO SACRAL ONE DECOMPRESSION AND FUSION, POSSIBLE LUMBAR FOUR TO SACRAL ONE; SCREW AND CAGE INSTRUMENTATION;  Surgeon: Melina Schools, MD;  Location: [facility];  Service: Orthopedics;  Laterality: N/A;  5 hrs   LYMPHADENECTOMY  05/03/2012   Procedure: LYMPHADENECTOMY;  Surgeon: Alvino Chapel, MD;  [facility];  Service: Gynecology;  Laterality: Bilateral;  pelvic   RADICAL HYSTERECTOMY  05/03/2012   Procedure: RADICAL HYSTERECTOMY;  Surgeon: Alvino Chapel, MD;  [facility];  Service: Gynecology;;  transposition of the ovaries   SPINE SURGERY     TUBAL LIGATION     Social History   Occupational History   Occupation: [employer]  Tobacco Use   Smoking status: Former    Packs/day: 0.50     Years: 1.00    Pack years: 0.50    Types: Cigarettes    Start date: 11/03/2018   Smokeless tobacco: Never   Tobacco comments:    smoking cessation information given  Vaping Use   Vaping Use: Never used  Substance and Sexual Activity   Alcohol use: Never   Drug use: Never   Sexual activity: Not Currently    Birth control/protection: Surgical    Comment: hyst     Garald Balding, MD   Note - This record has been created using Editor, commissioning.  Chart creation errors have been sought, but may not always  have been located. Such creation errors do not reflect on  the standard of medical care.

## 2020-12-19 ENCOUNTER — Other Ambulatory Visit: Payer: Self-pay | Admitting: Neurology

## 2020-12-19 DIAGNOSIS — G43709 Chronic migraine without aura, not intractable, without status migrainosus: Secondary | ICD-10-CM

## 2020-12-20 ENCOUNTER — Encounter (HOSPITAL_COMMUNITY): Payer: BC Managed Care – PPO

## 2020-12-23 ENCOUNTER — Ambulatory Visit (HOSPITAL_COMMUNITY): Payer: BC Managed Care – PPO | Admitting: Physical Therapy

## 2020-12-23 ENCOUNTER — Other Ambulatory Visit: Payer: Self-pay | Admitting: Neurology

## 2020-12-23 DIAGNOSIS — G43709 Chronic migraine without aura, not intractable, without status migrainosus: Secondary | ICD-10-CM

## 2020-12-25 ENCOUNTER — Institutional Professional Consult (permissible substitution): Payer: BC Managed Care – PPO | Admitting: Plastic Surgery

## 2020-12-30 ENCOUNTER — Ambulatory Visit (HOSPITAL_COMMUNITY): Payer: Medicaid Other | Admitting: Physical Therapy

## 2021-01-10 ENCOUNTER — Telehealth: Payer: Medicaid Other | Admitting: Nurse Practitioner

## 2021-01-10 DIAGNOSIS — S025XXA Fracture of tooth (traumatic), initial encounter for closed fracture: Secondary | ICD-10-CM

## 2021-01-10 DIAGNOSIS — K047 Periapical abscess without sinus: Secondary | ICD-10-CM

## 2021-01-11 MED ORDER — AMOXICILLIN 500 MG PO CAPS: 500.0000 mg | ORAL_CAPSULE | Freq: Three times a day (TID) | ORAL | 0 refills | Status: AC

## 2021-01-11 MED ORDER — NAPROXEN 500 MG PO TABS: 500.0000 mg | ORAL_TABLET | Freq: Two times a day (BID) | ORAL | 0 refills | Status: DC

## 2021-01-11 NOTE — Progress Notes (Signed)

## 2021-01-13 ENCOUNTER — Ambulatory Visit (HOSPITAL_COMMUNITY): Payer: BC Managed Care – PPO | Admitting: Physical Therapy

## 2021-01-14 ENCOUNTER — Other Ambulatory Visit: Payer: Self-pay | Admitting: Family Medicine

## 2021-01-14 DIAGNOSIS — F419 Anxiety disorder, unspecified: Secondary | ICD-10-CM

## 2021-01-14 MED ORDER — BUSPIRONE HCL 5 MG PO TABS: 5.0000 mg | ORAL_TABLET | Freq: Two times a day (BID) | ORAL | 0 refills | Status: DC

## 2021-01-16 ENCOUNTER — Other Ambulatory Visit: Payer: Self-pay | Admitting: Orthopedic Surgery

## 2021-01-16 DIAGNOSIS — G5601 Carpal tunnel syndrome, right upper limb: Secondary | ICD-10-CM | POA: Diagnosis not present

## 2021-01-16 NOTE — Progress Notes (Unsigned)
Oxycodone  °

## 2021-01-17 ENCOUNTER — Telehealth: Payer: Medicaid Other | Admitting: Physician Assistant

## 2021-01-17 ENCOUNTER — Other Ambulatory Visit: Payer: Self-pay | Admitting: Neurology

## 2021-01-17 DIAGNOSIS — G4489 Other headache syndrome: Secondary | ICD-10-CM | POA: Diagnosis not present

## 2021-01-17 DIAGNOSIS — R42 Dizziness and giddiness: Secondary | ICD-10-CM | POA: Diagnosis not present

## 2021-01-17 DIAGNOSIS — R509 Fever, unspecified: Secondary | ICD-10-CM | POA: Diagnosis not present

## 2021-01-17 DIAGNOSIS — Z20822 Contact with and (suspected) exposure to covid-19: Secondary | ICD-10-CM

## 2021-01-18 ENCOUNTER — Encounter: Payer: Self-pay | Admitting: Physician Assistant

## 2021-01-18 ENCOUNTER — Telehealth: Payer: Medicaid Other | Admitting: Orthopedic Surgery

## 2021-01-18 DIAGNOSIS — R12 Heartburn: Secondary | ICD-10-CM

## 2021-01-18 MED ORDER — MECLIZINE HCL 25 MG PO TABS: 25.0000 mg | ORAL_TABLET | Freq: Three times a day (TID) | ORAL | 0 refills | Status: DC | PRN

## 2021-01-18 NOTE — Progress Notes (Signed)
E-Visit for Corona Virus Screening  Your current symptoms could be consistent with the coronavirus.  Many health care providers can now test patients at their office but not all are.  [facility] has multiple testing sites. For information on our [facility] testing locations and hours go to [email or web address]  We are enrolling you in our [facility] for [facility] . Daily you will receive a questionnaire within the [facility] website. Our COVID 19 response team will be monitoring your responses daily.  Testing Information: The COVID-19 Community Testing sites are testing BY APPOINTMENT ONLY.  You can schedule online at [email or web address]  If you do not have access to a smart phone or computer you may call [phone] for an appointment.   Additional testing sites in the Community:  For [facility] Testing sites in   FaceUpdate.uy  For Pop-up testing sites in [place]  BowlDirectory.co.uk  For [facility] [email or web address]  For [facility] testing in [place] and [employer] [email or web address]  For [facility] testing in [place]   [email or web address]  For  more information about community testing call [phone]   Please quarantine yourself while awaiting your test results. Please stay home for a minimum of 10 days from the first day of illness with improving symptoms and you have had 24 hours of no fever (without the use of Tylenol (Acetaminophen) Motrin (Ibuprofen) or any fever reducing medication).  Also - Do not get tested prior to returning to work because once you have had a positive test the test can stay positive for  more than a month in some cases.   You should wear a mask or cloth face covering over your nose and mouth if you must be around other people or animals, including pets (even at home). Try to stay at least 6 feet away from other people. This will protect the people around you.  Please continue good preventive care measures, including:  frequent hand-washing, avoid touching your face, cover coughs/sneezes, stay out of crowds and keep a 6 foot distance from others.  COVID-19 is a respiratory illness with symptoms that are similar to the flu. Symptoms are typically mild to moderate, but there have been cases of severe illness and death due to the virus.   The following symptoms may appear 2-14 days after exposure: Fever Cough Shortness of breath or difficulty breathing Chills Repeated shaking with chills Muscle pain Headache Sore throat New loss of taste or smell Fatigue Congestion or runny nose Nausea or vomiting Diarrhea  Go to the nearest hospital ED for assessment if fever/cough/breathlessness are severe or illness seems like a threat to life.  It is vitally important that if you feel that you have an infection such as this virus or any other virus that you stay home and away from places where you may spread it to others.  You should avoid contact with people age 66 and older.   For your dizziness, I have prescribed Meclizine 25 mg. Take one pill three times daily AS NEEDED.  If symptoms persit, please have a face to face evaluation of your symptoms.   You may also take acetaminophen (Tylenol) as needed for fever.  Reduce your risk of any infection by using the same precautions used for avoiding the common cold or flu:  Wash your hands often with soap and warm water for at least 20 seconds.  If soap and water are not readily available, use an alcohol-based hand sanitizer with at least 60% alcohol.  If  coughing or sneezing, cover your mouth and nose by coughing or sneezing into the elbow  areas of your shirt or coat, into a tissue or into your sleeve (not your hands). Avoid shaking hands with others and consider head nods or verbal greetings only. Avoid touching your eyes, nose, or mouth with unwashed hands.  Avoid close contact with people who are sick. Avoid places or events with large numbers of people in one location, like concerts or sporting events. Carefully consider travel plans you have or are making. If you are planning any travel outside or inside the [place], visit the CDC's Travelers' Health webpage for the latest health notices. If you have some symptoms but not all symptoms, continue to monitor at home and seek medical attention if your symptoms worsen. If you are having a medical emergency, call 911.  HOME CARE Only take medications as instructed by your medical team. Drink plenty of fluids and get plenty of rest. A steam or ultrasonic humidifier can help if you have congestion.   GET HELP RIGHT AWAY IF YOU HAVE EMERGENCY WARNING SIGNS** FOR COVID-19. If you or someone is showing any of these signs seek emergency medical care immediately. Call 911 or proceed to your closest emergency facility if: You develop worsening high fever. Trouble breathing Bluish lips or face Persistent pain or pressure in the chest New confusion Inability to wake or stay awake You cough up blood. Your symptoms become more severe  **This list is not all possible symptoms. Contact your medical provider for any symptoms that are sever or concerning to you.  MAKE SURE YOU  Understand these instructions. Will watch your condition. Will get help right away if you are not doing well or get worse.  Your e-visit answers were reviewed by a board certified advanced clinical practitioner to complete your personal care plan.  Depending on the condition, your plan could have included both over the counter or prescription medications.  If there is a problem please reply once you have received a  response from your provider.  Your safety is important to [place].  If you have drug allergies check your prescription carefully.    You can use MyChart to ask questions about today's visit, request a non-urgent call back, or ask for a work or school excuse for 24 hours related to this e-Visit. If it has been greater than 24 hours you will need to follow up with your provider, or enter a new e-Visit to address those concerns. You will get an e-mail in the next two days asking about your experience.  I hope that your e-visit has been valuable and will speed your recovery. Thank you for using e-visits.  I spent 5-10 minutes on review and completion of this note- Lacy Duverney [facility]

## 2021-01-19 NOTE — Progress Notes (Signed)
Based on what you shared with me, I feel your condition warrants further evaluation and I recommend that you be seen in a face to face visit.  Heartburn that does not respond to traditional treatments like antacids and proton pump inhibitors can sometimes be related to your heart itself. Please go to an urgent care or the emergency department to be evaluated.   NOTE: There will be NO CHARGE for this eVisit   If you are having a true medical emergency please call 911.      For an urgent face to face visit, [facility] has six urgent care centers for your convenience:     [facility] [place] Get Driving Directions [phone] [place], [place] [address]    [facility]) Get Driving Directions [phone] [place], [place] [address]  [facility] [facility]) Get Driving Directions [phone] [address] [place],  [place]  [address]  [facility] Get Driving Directions [phone] [place], [place], [place] [address]   [facility] Get Driving Directions  [phone] [address], [place] [address]   [facility] [place] Get Driving Directions [phone] [address], [place], [place] [address]  Your MyChart E-visit questionnaire answers were reviewed by a board certified advanced clinical practitioner to complete your personal care plan based on your specific symptoms.  Thank you for using e-Visits.

## 2021-01-20 ENCOUNTER — Telehealth: Payer: Medicaid Other | Admitting: Nurse Practitioner

## 2021-01-23 ENCOUNTER — Encounter: Payer: Medicaid Other | Admitting: Orthopaedic Surgery

## 2021-01-29 ENCOUNTER — Encounter: Payer: Self-pay | Admitting: Orthopaedic Surgery

## 2021-01-29 ENCOUNTER — Other Ambulatory Visit: Payer: Self-pay

## 2021-01-29 ENCOUNTER — Ambulatory Visit (INDEPENDENT_AMBULATORY_CARE_PROVIDER_SITE_OTHER): Payer: Medicaid Other | Admitting: Orthopaedic Surgery

## 2021-01-29 VITALS — Ht 63.0 in | Wt 225.0 lb

## 2021-01-29 DIAGNOSIS — G5603 Carpal tunnel syndrome, bilateral upper limbs: Secondary | ICD-10-CM

## 2021-01-29 NOTE — Progress Notes (Signed)
Office Visit Note   Patient: April Ayala           Date of Birth: June 04, 1976           MRN: [id number] Visit Date: 01/29/2021              Requested by: Lindell Spar, MD [address] [place],  [place] [address] PCP: Lindell Spar, MD   Assessment & Plan: Visit Diagnoses:  1. Bilateral carpal tunnel syndrome     Plan: 2-week status post right carpal tunnel release and doing well.  Stitches removed and Steri-Strips applied.  Neurologically intact.  We will wear splint and reevaluate in 2 weeks  Follow-Up Instructions: Return in about 2 weeks (around 02/12/2021).   Orders:  No orders of the defined types were placed in this encounter.  No orders of the defined types were placed in this encounter.     Procedures: No procedures performed   Clinical Data: No additional findings.   Subjective: Chief Complaint  Patient presents with   Right Wrist - Follow-up    Right carpal tunnel release 01/16/2021  Patient presents today for follow up on her right wrist. She had carpal tunnel release on 01/16/2021. She states that she is still having pain, 6 out of 10. She takes oxycodone every 4-6hours.  HPI  Review of Systems   Objective: Vital Signs: Ht '5\' 3"'$  (1.6 m)   Wt 225 lb (102.1 kg)   LMP 04/11/2012   BMI 39.86 kg/m   Physical Exam  Ortho Exam right carpal tunnel incision healing without problem.  Stitches removed and Steri-Strips applied with a Band-Aid.  Has full range of motion of her fingers and normal sensation.  Normal opposition of thumb the little finger.  Subjectively does not have the pain she had preoperatively  Specialty Comments:  No specialty comments available.  Imaging: No results found.   PMFS History: Patient Active Problem List   Diagnosis Date Noted   Annual visit for general adult medical examination with abnormal findings 09/10/2020   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   Anxiety with depression 06/30/2020   Prehypertension  06/26/2020   Acute low back pain 05/21/2020   Tachycardia determined by examination of pulse 05/17/2020   Right tennis elbow 02/21/2020   Vaginal Pap smear following hysterectomy for malignancy 12/18/2019   Environmental and seasonal allergies 09/29/2019   Nicotine dependence 09/29/2019   Binge eating 09/29/2019   Migraine 09/29/2019   Bilateral carpal tunnel syndrome 09/29/2019   Morbid obesity ([facility]) 09/29/2019   Other fatigue 09/29/2019   Vitamin D deficiency 09/29/2019   Past Medical History:  Diagnosis Date   Angular cheilitis 07/17/2020   Anxiety    BOOP (bronchiolitis obliterans with organizing pneumonia) ([facility]) 07/09/2020   Cervical cancer ([facility]) 04/2011   Stage 1B squamous cell   Chronic left shoulder pain 10/07/2017   Chronic tension-type headache, not intractable 10/07/2017   Class 2 obesity 09/29/2019   Cold sore 04/11/2020   Depression    Encounter for screening mammogram for malignant neoplasm of breast 09/29/2019   Encounter for support and coordination of transition of care 07/09/2020   Family history of diabetes mellitus 09/29/2019   Family history of hyperlipidemia 09/29/2019   Family history of hypothyroidism 09/29/2019   High cholesterol    History of cervical cancer 10/27/2019   History of kidney stones    passed   Prehypertension 06/26/2020   Respiratory failure with hypoxia ([facility]) 06/30/2020   S/P lumbar fusion 04/20/2018  Screening for colorectal cancer 12/18/2019   Soreness of tongue 04/23/2020    Family History  Problem Relation Age of Onset   Diabetes Mother    Hypertension Father    Diabetes Sister    Migraines Sister    Hypothyroidism Sister    Migraines Sister    Migraines Sister     Past Surgical History:  Procedure Laterality Date   ABDOMINAL HYSTERECTOMY     BREAST BIOPSY Right    CERVICAL CONIZATION W/BX  03/24/2012   Procedure: CONIZATION CERVIX WITH BIOPSY;  Surgeon: Woodroe Mode, MD;  Location: [facility];  Service: Gynecology;  Laterality: N/A;    DIAGNOSTIC LAPAROSCOPY  2007   ectopic   ECTOPIC PREGNANCY SURGERY     ESSURE TUBAL LIGATION  2010   INSERTION OF SUPRAPUBIC CATHETER  05/03/2012   Procedure: INSERTION OF SUPRAPUBIC CATHETER;  Surgeon: Alvino Chapel, MD;  Location: [facility];  Service: Gynecology;;   LEEP     LUMBAR LAMINECTOMY/DECOMPRESSION MICRODISCECTOMY N/A 04/20/2018   Procedure: LUMBAR FIVE TO SACRAL ONE DECOMPRESSION AND FUSION, POSSIBLE LUMBAR FOUR TO SACRAL ONE; SCREW AND CAGE INSTRUMENTATION;  Surgeon: Melina Schools, MD;  Location: [facility];  Service: Orthopedics;  Laterality: N/A;  5 hrs   LYMPHADENECTOMY  05/03/2012   Procedure: LYMPHADENECTOMY;  Surgeon: Alvino Chapel, MD;  Location: [facility];  Service: Gynecology;  Laterality: Bilateral;  pelvic   RADICAL HYSTERECTOMY  05/03/2012   Procedure: RADICAL HYSTERECTOMY;  Surgeon: Alvino Chapel, MD;  Location: [facility];  Service: Gynecology;;  transposition of the ovaries   SPINE SURGERY     TUBAL LIGATION     Social History   Occupational History   Occupation: [employer]  Tobacco Use   Smoking status: Former    Packs/day: 0.50    Years: 1.00    Pack years: 0.50    Types: Cigarettes    Start date: 11/03/2018   Smokeless tobacco: Never   Tobacco comments:    smoking cessation information given  Vaping Use   Vaping Use: Never used  Substance and Sexual Activity   Alcohol use: Never   Drug use: Never   Sexual activity: Not Currently    Birth control/protection: Surgical    Comment: hyst

## 2021-01-31 ENCOUNTER — Other Ambulatory Visit: Payer: Self-pay

## 2021-01-31 ENCOUNTER — Encounter: Payer: Self-pay | Admitting: Nurse Practitioner

## 2021-01-31 ENCOUNTER — Encounter: Payer: Medicaid Other | Admitting: Nurse Practitioner

## 2021-02-04 ENCOUNTER — Encounter (INDEPENDENT_AMBULATORY_CARE_PROVIDER_SITE_OTHER): Payer: Self-pay

## 2021-02-05 NOTE — Telephone Encounter (Signed)
Good call. She needs ED work-up for dizziness and slurred speech.

## 2021-02-05 NOTE — Telephone Encounter (Signed)
Spoke with pt and informed her to go to the ER for slurred speech and dizziness that has been going on for a few days.

## 2021-02-06 ENCOUNTER — Other Ambulatory Visit: Payer: Self-pay

## 2021-02-06 ENCOUNTER — Encounter: Payer: Self-pay | Admitting: Internal Medicine

## 2021-02-06 ENCOUNTER — Ambulatory Visit: Payer: Medicaid Other | Admitting: Internal Medicine

## 2021-02-06 VITALS — BP 120/82 | HR 94 | Temp 98.5°F | Resp 18 | Ht 63.0 in | Wt 233.4 lb

## 2021-02-06 DIAGNOSIS — R29810 Facial weakness: Secondary | ICD-10-CM

## 2021-02-06 DIAGNOSIS — R300 Dysuria: Secondary | ICD-10-CM | POA: Diagnosis not present

## 2021-02-06 DIAGNOSIS — F418 Other specified anxiety disorders: Secondary | ICD-10-CM

## 2021-02-06 DIAGNOSIS — Z720 Tobacco use: Secondary | ICD-10-CM

## 2021-02-06 DIAGNOSIS — N3000 Acute cystitis without hematuria: Secondary | ICD-10-CM

## 2021-02-06 LAB — POCT URINALYSIS DIP (CLINITEK)
Bilirubin, UA: NEGATIVE
Blood, UA: NEGATIVE
Glucose, UA: NEGATIVE mg/dL
Ketones, POC UA: NEGATIVE mg/dL
Nitrite, UA: POSITIVE — AB
POC PROTEIN,UA: NEGATIVE
Spec Grav, UA: 1.01 (ref 1.010–1.025)
Urobilinogen, UA: 0.2 E.U./dL
pH, UA: 6 (ref 5.0–8.0)

## 2021-02-06 MED ORDER — SULFAMETHOXAZOLE-TRIMETHOPRIM 800-160 MG PO TABS: 1.0000 | ORAL_TABLET | Freq: Two times a day (BID) | ORAL | 0 refills | Status: DC

## 2021-02-06 MED ORDER — BUPROPION HCL ER (SR) 150 MG PO TB12: 150.0000 mg | ORAL_TABLET | Freq: Two times a day (BID) | ORAL | 2 refills | Status: DC

## 2021-02-06 NOTE — Progress Notes (Signed)
Acute Office Visit  Subjective:    Patient ID: April Ayala, female    DOB: 1975/11/24, 45 y.o.   MRN: [id number]  Chief Complaint  Patient presents with   Acute Visit    Patient has been dizzy light headed for a few weeks ago she also had fever and uti symptoms she did evisit and was given medication however she is out she also is still having burning and pain she has been having slurred speech for about 4 days and and 4 days ago her face looked droopy this is better now  also pt wants to quit smoking. Pt also has congestion and light fever within the past week. She thinks buspar is not strong enough also    HPI Patient is in today for evaluation of dysuria and fever for last few days. She had antibiotics, but her symptoms recurred. She denies any hematuria currently.  She states that her anxiety is not controlled with Buspar. Denies anhedonia, SI or HI.  She is interested in quitting smoking. She used to take Chantix before it got recalled.  She also c/o facial droop and slurred speech about 4 days ago, which spontaneously resolved. She reports cough and nasal congestion for last few days. Denies having a COVID test.  Past Medical History:  Diagnosis Date   Angular cheilitis 07/17/2020   Anxiety    BOOP (bronchiolitis obliterans with organizing pneumonia) ([facility]) 07/09/2020   Cervical cancer ([facility]) 04/2011   Stage 1B squamous cell   Chronic left shoulder pain 10/07/2017   Chronic tension-type headache, not intractable 10/07/2017   Class 2 obesity 09/29/2019   Cold sore 04/11/2020   Depression    Encounter for screening mammogram for malignant neoplasm of breast 09/29/2019   Encounter for support and coordination of transition of care 07/09/2020   Family history of diabetes mellitus 09/29/2019   Family history of hyperlipidemia 09/29/2019   Family history of hypothyroidism 09/29/2019   High cholesterol    History of cervical cancer 10/27/2019   History of kidney stones    passed    Prehypertension 06/26/2020   Respiratory failure with hypoxia ([facility]) 06/30/2020   S/P lumbar fusion 04/20/2018   Screening for colorectal cancer 12/18/2019   Soreness of tongue 04/23/2020    Past Surgical History:  Procedure Laterality Date   ABDOMINAL HYSTERECTOMY     BREAST BIOPSY Right    CERVICAL CONIZATION W/BX  03/24/2012   Procedure: CONIZATION CERVIX WITH BIOPSY;  Surgeon: Woodroe Mode, MD;  Location: [facility];  Service: Gynecology;  Laterality: N/A;   DIAGNOSTIC LAPAROSCOPY  2007   ectopic   ECTOPIC PREGNANCY SURGERY     ESSURE TUBAL LIGATION  2010   INSERTION OF SUPRAPUBIC CATHETER  05/03/2012   Procedure: INSERTION OF SUPRAPUBIC CATHETER;  Surgeon: Alvino Chapel, MD;  Location: [facility];  Service: Gynecology;;   LEEP     LUMBAR LAMINECTOMY/DECOMPRESSION MICRODISCECTOMY N/A 04/20/2018   Procedure: LUMBAR FIVE TO SACRAL ONE DECOMPRESSION AND FUSION, POSSIBLE LUMBAR FOUR TO SACRAL ONE; SCREW AND CAGE INSTRUMENTATION;  Surgeon: Melina Schools, MD;  Location: [facility];  Service: Orthopedics;  Laterality: N/A;  5 hrs   LYMPHADENECTOMY  05/03/2012   Procedure: LYMPHADENECTOMY;  Surgeon: Alvino Chapel, MD;  Location: [facility];  Service: Gynecology;  Laterality: Bilateral;  pelvic   RADICAL HYSTERECTOMY  05/03/2012   Procedure: RADICAL HYSTERECTOMY;  Surgeon: Alvino Chapel, MD;  Location: [facility];  Service: Gynecology;;  transposition of the ovaries   SPINE  SURGERY     TUBAL LIGATION      Family History  Problem Relation Age of Onset   Diabetes Mother    Hypertension Father    Diabetes Sister    Migraines Sister    Hypothyroidism Sister    Migraines Sister    Migraines Sister     Social History   Socioeconomic History   Marital status: Significant Other    Spouse name: Not on file   Number of children: 4   Years of education: Not on file   Highest education level: 12th grade  Occupational History   Occupation: Paediatric nurse  Tobacco Use   Smoking  status: Every Day    Packs/day: 1.00    Years: 1.00    Pack years: 1.00    Types: Cigarettes    Start date: 11/03/2018   Smokeless tobacco: Never   Tobacco comments:    smoking cessation information given  Vaping Use   Vaping Use: Never used  Substance and Sexual Activity   Alcohol use: Never   Drug use: Never   Sexual activity: Not Currently    Birth control/protection: Surgical    Comment: hyst  Other Topics Concern   Not on file  Social History Narrative   Lives with fiance and 1 daughter      1 son is with grandmother    Oldest daughter is 57 lives with grandfather      Works as a Aeronautical engineer at [employer].     Lives in [place], [place].      6 dogs, a  beard dragon, a snake, and a hamster and a cat      Enjoys: taking care of her pets      Diet: avoids lunch due to work, eats all food groups   Caffeine: monsters in the [employer]: limited      Wears seat belt    Does not use phone while driving    Smoke detectors at home       Right handed   Social Determinants of Health   Financial Resource Strain: Low Risk    Difficulty of Paying Living Expenses: Not very hard  Food Insecurity: No Food Insecurity   Worried About Charity fundraiser in the Last Year: Never true   [place] in the Last Year: Never true  Transportation Needs: No Transportation Needs   Lack of Transportation (Medical): No   Lack of Transportation (Non-Medical): No  Physical Activity: Inactive   Days of Exercise per Week: 0 days   Minutes of Exercise per Session: 0 min  Stress: Stress Concern Present   Feeling of Stress : Rather much  Social Connections: Socially Isolated   Frequency of Communication with Friends and Family: Twice a week   Frequency of Social Gatherings with Friends and Family: Never   Attends Religious Services: Never   Marine scientist or Organizations: No   Attends Music therapist: Never   Marital Status: Separated  Intimate  Partner Violence: Not At Risk   Fear of Current or Ex-Partner: No   Emotionally Abused: No   Physically Abused: No   Sexually Abused: No    Outpatient Medications Prior to Visit  Medication Sig Dispense Refill   albuterol (VENTOLIN HFA) 108 (90 Base) MCG/ACT inhaler Inhale 2 puffs into the lungs every 4 (four) hours as needed for wheezing or shortness of breath. 18 g 2   amitriptyline (ELAVIL) 75 MG tablet Take  1 tablet (75 mg total) by mouth at bedtime. 30 tablet 1   busPIRone (BUSPAR) 5 MG tablet Take 1 tablet (5 mg total) by mouth 2 (two) times daily. 60 tablet 0   cyclobenzaprine (FLEXERIL) 10 MG tablet Take 10 mg by mouth 3 (three) times daily.      EMGALITY 120 MG/ML SOAJ INJECT 120 MG INTO THE SKIN EVERY 30 DAYS 1 mL 5   fluticasone (FLONASE) 50 MCG/ACT nasal spray Place 2 sprays into both nostrils daily. 16 g 0   fluticasone furoate-vilanterol (BREO ELLIPTA) 200-25 MCG/INH AEPB Inhale 1 puff into the lungs daily. 30 each 2   oxyCODONE (OXYCONTIN) 10 mg 12 hr tablet Take 10 mg by mouth every 12 (twelve) hours.      pregabalin (LYRICA) 150 MG capsule Take 150 mg by mouth 2 (two) times daily.     rizatriptan (MAXALT-MLT) 10 MG disintegrating tablet Take 1 tablet (10 mg total) by mouth as needed for migraine. May repeat in 2 hours if needed 9 tablet 5   rosuvastatin (CRESTOR) 5 MG tablet Take 1 tablet (5 mg total) by mouth daily. 30 tablet 1   meclizine (ANTIVERT) 25 MG tablet Take 1 tablet (25 mg total) by mouth 3 (three) times daily as needed for dizziness. (Patient not taking: Reported on 02/06/2021) 21 tablet 0   oxyCODONE-acetaminophen (PERCOCET) 10-325 MG tablet Take 1 tablet by mouth 4 (four) times daily as needed.  (Patient not taking: Reported on 02/06/2021)     No facility-administered medications prior to visit.    No Known Allergies  Review of Systems  Constitutional:  Positive for fever. Negative for chills.  HENT:  Negative for congestion, sinus pressure, sinus pain and  sore throat.   Eyes:  Negative for pain and discharge.  Respiratory:  Positive for cough. Negative for shortness of breath.   Cardiovascular:  Negative for chest pain and palpitations.  Gastrointestinal:  Negative for abdominal pain, constipation, diarrhea, nausea and vomiting.  Endocrine: Negative for polydipsia and polyuria.  Genitourinary:  Positive for dysuria. Negative for hematuria.  Musculoskeletal:  Positive for arthralgias and back pain. Negative for neck pain and neck stiffness.  Skin:  Negative for rash.  Neurological:  Negative for dizziness and weakness.  Psychiatric/Behavioral:  Negative for agitation and behavioral problems.       Objective:    Physical Exam Vitals reviewed.  Constitutional:      General: She is not in acute distress.    Appearance: She is obese. She is not diaphoretic.  HENT:     Head: Normocephalic and atraumatic.     Nose: Nose normal.     Mouth/Throat:     Mouth: Mucous membranes are moist.  Eyes:     General: No scleral icterus.    Extraocular Movements: Extraocular movements intact.  Cardiovascular:     Rate and Rhythm: Normal rate and regular rhythm.     Pulses: Normal pulses.     Heart sounds: Normal heart sounds. No murmur heard. Pulmonary:     Breath sounds: Normal breath sounds. No wheezing or rales.  Abdominal:     Palpations: Abdomen is soft.     Tenderness: There is no abdominal tenderness. There is no right CVA tenderness or left CVA tenderness.  Musculoskeletal:     Cervical back: Neck supple. No tenderness.     Right lower leg: No edema.     Left lower leg: No edema.  Skin:    General: Skin is warm.  Findings: No rash.  Neurological:     General: No focal deficit present.     Mental Status: She is alert and oriented to person, place, and time.  Psychiatric:        Mood and Affect: Mood normal.        Behavior: Behavior normal.    BP 120/82 (BP Location: Left Arm, Patient Position: Sitting, Cuff Size: Normal)    Pulse 94   Temp 98.5 F (36.9 C) (Oral)   Resp 18   Ht _0  (1.6 m)   Wt 233 lb 6.4 oz (105.9 kg)   LMP 04/11/2012   SpO2 99%   BMI 41.34 kg/m  Wt Readings from Last 3 Encounters:  02/06/21 233 lb 6.4 oz (105.9 kg)  01/31/21 230 lb (104.3 kg)  01/29/21 225 lb (102.1 kg)    Health Maintenance Due  Topic Date Due   Pneumococcal Vaccine 43-67 Years old (3 - PPSV23 or PCV20) 03/17/2019   COVID-19 Vaccine (3 - Pfizer risk series) 04/02/2020   INFLUENZA VACCINE  02/03/2021    There are no preventive care reminders to display for this patient.   Lab Results  Component Value Date   TSH 1.220 09/10/2020   Lab Results  Component Value Date   WBC 11.1 (H) 09/10/2020   HGB 15.3 09/10/2020   HCT 46.4 09/10/2020   MCV 90 09/10/2020   PLT 416 09/10/2020   Lab Results  Component Value Date   NA 140 09/10/2020   K 4.6 09/10/2020   CO2 21 09/10/2020   GLUCOSE 97 09/10/2020   BUN 7 09/10/2020   CREATININE 0.64 09/10/2020   BILITOT 0.3 09/10/2020   ALKPHOS 89 09/10/2020   AST 24 09/10/2020   ALT 21 09/10/2020   PROT 6.4 09/10/2020   ALBUMIN 3.9 09/10/2020   CALCIUM 9.3 09/10/2020   ANIONGAP 10 07/01/2020   EGFR 113 09/10/2020   Lab Results  Component Value Date   CHOL 135 09/10/2020   Lab Results  Component Value Date   HDL 49 09/10/2020   Lab Results  Component Value Date   LDLCALC 67 09/10/2020   Lab Results  Component Value Date   TRIG 102 09/10/2020   Lab Results  Component Value Date   CHOLHDL 2.8 09/10/2020   Lab Results  Component Value Date   HGBA1C 5.2 09/10/2020       Assessment & Plan:   Problem List Items Addressed This Visit       Other   Tobacco abuse    Asked about quitting: confirms that she currently smokes cigarettes Advise to quit smoking: Educated about QUITTING to reduce the risk of cancer, cardio and cerebrovascular disease. Assess willingness: Unwilling to quit at this time, but is working on cutting back. Assist with  counseling and pharmacotherapy: Counseled for 5 minutes and literature provided. Started Wellbutrin. Advised to use Nicotine patch or gum. Arrange for follow up: follow up in 3 months and continue to offer help.       Relevant Medications   buPROPion (WELLBUTRIN SR) 150 MG 12 hr tablet   Anxiety with depression    Uncontrolled with Buspar On Amitriptyline for insomnia Added Wellbutrin for smoking cessation benefit       Relevant Medications   buPROPion (WELLBUTRIN SR) 150 MG 12 hr tablet   Other Visit Diagnoses     Acute cystitis without hematuria    -  Primary   Relevant Medications   sulfamethoxazole-trimethoprim (BACTRIM DS) 800-160 MG tablet  Other Relevant Orders   POCT URINALYSIS DIP (CLINITEK) (Completed)   Urine Culture   Facial droop     Now resolved Could be due to viral illness Less likely to be neurologic event considering inconsistent history Needs to get COVID test for cough        Meds ordered this encounter  Medications   buPROPion (WELLBUTRIN SR) 150 MG 12 hr tablet    Sig: Take 1 tablet (150 mg total) by mouth 2 (two) times daily.    Dispense:  60 tablet    Refill:  2   sulfamethoxazole-trimethoprim (BACTRIM DS) 800-160 MG tablet    Sig: Take 1 tablet by mouth 2 (two) times daily.    Dispense:  14 tablet    Refill:  0     Ailanie Ruttan Keith Rake, MD

## 2021-02-06 NOTE — Assessment & Plan Note (Signed)
Uncontrolled with Buspar On Amitriptyline for insomnia Added Wellbutrin for smoking cessation benefit

## 2021-02-06 NOTE — Assessment & Plan Note (Signed)
Asked about quitting: confirms that she currently smokes cigarettes Advise to quit smoking: Educated about QUITTING to reduce the risk of cancer, cardio and cerebrovascular disease. Assess willingness: Unwilling to quit at this time, but is working on cutting back. Assist with counseling and pharmacotherapy: Counseled for 5 minutes and literature provided. Started Wellbutrin. Advised to use Nicotine patch or gum. Arrange for follow up: follow up in 3 months and continue to offer help.

## 2021-02-06 NOTE — Patient Instructions (Signed)
Please start taking Wellbutrin as prescribed. Continue to take Buspar.  Please cut down -> cut smoking. Wellbutrin should help with craving.  If you have any episode of slurred speech or facial droop, please go to ER immediately.

## 2021-02-10 ENCOUNTER — Other Ambulatory Visit: Payer: Self-pay | Admitting: *Deleted

## 2021-02-10 ENCOUNTER — Encounter: Payer: Self-pay | Admitting: Internal Medicine

## 2021-02-10 ENCOUNTER — Other Ambulatory Visit: Payer: Self-pay | Admitting: Family Medicine

## 2021-02-10 DIAGNOSIS — F419 Anxiety disorder, unspecified: Secondary | ICD-10-CM

## 2021-02-10 DIAGNOSIS — E7841 Elevated Lipoprotein(a): Secondary | ICD-10-CM

## 2021-02-10 DIAGNOSIS — G43809 Other migraine, not intractable, without status migrainosus: Secondary | ICD-10-CM

## 2021-02-10 MED ORDER — BUSPIRONE HCL 5 MG PO TABS: 5.0000 mg | ORAL_TABLET | Freq: Two times a day (BID) | ORAL | 0 refills | Status: DC

## 2021-02-10 MED ORDER — AMITRIPTYLINE HCL 75 MG PO TABS: 75.0000 mg | ORAL_TABLET | Freq: Every day | ORAL | 1 refills | Status: DC

## 2021-02-10 MED ORDER — ROSUVASTATIN CALCIUM 5 MG PO TABS: 5.0000 mg | ORAL_TABLET | Freq: Every day | ORAL | 1 refills | Status: DC

## 2021-02-11 ENCOUNTER — Telehealth: Payer: Self-pay | Admitting: Internal Medicine

## 2021-02-11 ENCOUNTER — Other Ambulatory Visit: Payer: Self-pay | Admitting: Internal Medicine

## 2021-02-11 DIAGNOSIS — N3000 Acute cystitis without hematuria: Secondary | ICD-10-CM

## 2021-02-11 LAB — URINE CULTURE

## 2021-02-11 MED ORDER — NITROFURANTOIN MONOHYD MACRO 100 MG PO CAPS: 100.0000 mg | ORAL_CAPSULE | Freq: Two times a day (BID) | ORAL | 0 refills | Status: DC

## 2021-02-11 NOTE — Telephone Encounter (Signed)
Pt is calling back

## 2021-02-11 NOTE — Telephone Encounter (Signed)
Pt advised of labs with verbal understanding

## 2021-02-12 ENCOUNTER — Other Ambulatory Visit: Payer: Self-pay

## 2021-02-12 ENCOUNTER — Encounter: Payer: Self-pay | Admitting: Orthopaedic Surgery

## 2021-02-12 ENCOUNTER — Ambulatory Visit (INDEPENDENT_AMBULATORY_CARE_PROVIDER_SITE_OTHER): Payer: Medicaid Other | Admitting: Orthopaedic Surgery

## 2021-02-12 VITALS — Ht 63.0 in | Wt 233.0 lb

## 2021-02-12 DIAGNOSIS — G5603 Carpal tunnel syndrome, bilateral upper limbs: Secondary | ICD-10-CM

## 2021-02-12 NOTE — Progress Notes (Signed)
Office Visit Note   Patient: April Ayala           Date of Birth: [birth year]           MRN: [id number] Visit Date: 02/12/2021              Requested by: Lindell Spar, MD [address] [place],  [place] [address] PCP: Lindell Spar, MD   Assessment & Plan: Visit Diagnoses:  1. Bilateral carpal tunnel syndrome     Plan: 1 month status post right carpal tunnel release and doing very well.  Not having any more numbness or tingling or significant pain.  Wound is healing nicely.  Neurologically intact.  Good opposition of thumb the little finger.  Still has a little induration around the incision but she can use vitamin E or aloe vera.  We will plan to see her back as needed.  Follow-Up Instructions: Return if symptoms worsen or fail to improve.   Orders:  No orders of the defined types were placed in this encounter.  No orders of the defined types were placed in this encounter.     Procedures: No procedures performed   Clinical Data: No additional findings.   Subjective: Chief Complaint  Patient presents with   Right Wrist - Follow-up    Right carpal tunnel release 01/16/2021  Patient presents today for a two week follow up on her right wrist. She is now 4 weeks out from a right carpal tunnel release. Her surgery was on 01/16/2021. She is doing well. No complaints.  HPI  Review of Systems   Objective: Vital Signs: Ht '5\' 3"'$  (1.6 m)   Wt 233 lb (105.7 kg)   LMP 04/11/2012   BMI 41.27 kg/m   Physical Exam  Ortho Exam right hand with full range of motion of the digits.  Neurologically intact.  Good opposition of thumb the little finger.  Pain over the carpal tunnel incision.  There is slight induration of the incision but no particular pain  Specialty Comments:  No specialty comments available.  Imaging: No results found.   PMFS History: Patient Active Problem List   Diagnosis Date Noted   Annual visit for general adult medical examination with  abnormal findings 09/10/2020   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   Anxiety with depression 06/30/2020   Prehypertension 06/26/2020   Acute low back pain 05/21/2020   Right tennis elbow 02/21/2020   Vaginal Pap smear following hysterectomy for malignancy 12/18/2019   Environmental and seasonal allergies 09/29/2019   Tobacco abuse 09/29/2019   Binge eating 09/29/2019   Migraine 09/29/2019   Bilateral carpal tunnel syndrome 09/29/2019   Morbid obesity ([place]) 09/29/2019   Vitamin D deficiency 09/29/2019   Past Medical History:  Diagnosis Date   Angular cheilitis 07/17/2020   Anxiety    BOOP (bronchiolitis obliterans with organizing pneumonia) ([place]) 07/09/2020   Cervical cancer ([place]) 04/2011   Stage 1B squamous cell   Chronic left shoulder pain 10/07/2017   Chronic tension-type headache, not intractable 10/07/2017   Class 2 obesity 09/29/2019   Cold sore 04/11/2020   Depression    Encounter for screening mammogram for malignant neoplasm of breast 09/29/2019   Encounter for support and coordination of transition of care 07/09/2020   Family history of diabetes mellitus 09/29/2019   Family history of hyperlipidemia 09/29/2019   Family history of hypothyroidism 09/29/2019   High cholesterol    History of cervical cancer 10/27/2019   History of kidney stones  passed   Prehypertension 06/26/2020   Respiratory failure with hypoxia ([place]) 06/30/2020   S/P lumbar fusion 04/20/2018   Screening for colorectal cancer 12/18/2019   Soreness of tongue 04/23/2020    Family History  Problem Relation Age of Onset   Diabetes Mother    Hypertension Father    Diabetes Sister    Migraines Sister    Hypothyroidism Sister    Migraines Sister    Migraines Sister     Past Surgical History:  Procedure Laterality Date   ABDOMINAL HYSTERECTOMY     BREAST BIOPSY Right    CERVICAL CONIZATION W/BX  03/24/2012   Procedure: CONIZATION CERVIX WITH BIOPSY;  Surgeon: Woodroe Mode, MD;  Location: [facility];  Service:  Gynecology;  Laterality: N/A;   DIAGNOSTIC LAPAROSCOPY  2007   ectopic   ECTOPIC PREGNANCY SURGERY     ESSURE TUBAL LIGATION  2010   INSERTION OF SUPRAPUBIC CATHETER  05/03/2012   Procedure: INSERTION OF SUPRAPUBIC CATHETER;  Surgeon: Alvino Chapel, MD;  Location: [facility];  Service: Gynecology;;   LEEP     LUMBAR LAMINECTOMY/DECOMPRESSION MICRODISCECTOMY N/A 04/20/2018   Procedure: LUMBAR FIVE TO SACRAL ONE DECOMPRESSION AND FUSION, POSSIBLE LUMBAR FOUR TO SACRAL ONE; SCREW AND CAGE INSTRUMENTATION;  Surgeon: Melina Schools, MD;  Location: [facility];  Service: Orthopedics;  Laterality: N/A;  5 hrs   LYMPHADENECTOMY  05/03/2012   Procedure: LYMPHADENECTOMY;  Surgeon: Alvino Chapel, MD;  Location: [facility];  Service: Gynecology;  Laterality: Bilateral;  pelvic   RADICAL HYSTERECTOMY  05/03/2012   Procedure: RADICAL HYSTERECTOMY;  Surgeon: Alvino Chapel, MD;  Location: [facility];  Service: Gynecology;;  transposition of the ovaries   SPINE SURGERY     TUBAL LIGATION     Social History   Occupational History   Occupation: [employer]  Tobacco Use   Smoking status: Every Day    Packs/day: 1.00    Years: 1.00    Pack years: 1.00    Types: Cigarettes    Start date: 11/03/2018   Smokeless tobacco: Never   Tobacco comments:    smoking cessation information given  Vaping Use   Vaping Use: Never used  Substance and Sexual Activity   Alcohol use: Never   Drug use: Never   Sexual activity: Not Currently    Birth control/protection: Surgical    Comment: hyst

## 2021-02-13 NOTE — Progress Notes (Signed)
Sent to April Ayala to close or contact IT if no show

## 2021-02-14 ENCOUNTER — Telehealth: Payer: Medicaid Other | Admitting: Nurse Practitioner

## 2021-02-14 DIAGNOSIS — K219 Gastro-esophageal reflux disease without esophagitis: Secondary | ICD-10-CM | POA: Diagnosis not present

## 2021-02-15 MED ORDER — OMEPRAZOLE 20 MG PO CPDR: 20.0000 mg | DELAYED_RELEASE_CAPSULE | Freq: Every day | ORAL | 3 refills | Status: DC

## 2021-02-15 NOTE — Progress Notes (Signed)
E-Visit for Heartburn  We are sorry that you are not feeling well.  Here is how we plan to help!  Based on what you shared with me it looks like you most likely have Gastroesophageal Reflux Disease (GERD)  Gastroesophageal reflux disease (GERD) happens when acid from your stomach flows up into the esophagus.  When acid comes in contact with the esophagus, the acid causes sorenss (inflammation) in the esophagus.  Over time, GERD may create small holes (ulcers) in the lining of the esophagus.  I have prescribed Omeprazole 20 mg one by mouth daily until you follow up with a provider.  Your symptoms should improve in the next day or two.  You can use antacids as needed until symptoms resolve.  Call us if your heartburn worsens, you have trouble swallowing, weight loss, spitting up blood or recurrent vomiting.  Home Care: May include lifestyle changes such as weight loss, quitting smoking and alcohol consumption Avoid foods and drinks that make your symptoms worse, such as: Caffeine or alcoholic drinks Chocolate Peppermint or mint flavorings Garlic and onions Spicy foods Citrus fruits, such as oranges, lemons, or limes Tomato-based foods such as sauce, chili, salsa and pizza Fried and fatty foods Avoid lying down for 3 hours prior to your bedtime or prior to taking a nap Eat small, frequent meals instead of a large meals Wear loose-fitting clothing.  Do not wear anything tight around your waist that causes pressure on your stomach. Raise the head of your bed 6 to 8 inches with wood blocks to help you sleep.  Extra pillows will not help.  Seek Help Right Away If: You have pain in your arms, neck, jaw, teeth or back Your pain increases or changes in intensity or duration You develop nausea, vomiting or sweating (diaphoresis) You develop shortness of breath or you faint Your vomit is green, yellow, black or looks like coffee grounds or blood Your stool is red, bloody or black  These  symptoms could be signs of other problems, such as heart disease, gastric bleeding or esophageal bleeding.  Make sure you : Understand these instructions. Will watch your condition. Will get help right away if you are not doing well or get worse.  Your e-visit answers were reviewed by a board certified advanced clinical practitioner to complete your personal care plan.  Depending on the condition, your plan could have included both over the counter or prescription medications.  If there is a problem please reply  once you have received a response from your provider.  Your safety is important to [place].  If you have drug allergies check your prescription carefully.    You can use MyChart to ask questions about today's visit, request a non-urgent call back, or ask for a work or school excuse for 24 hours related to this e-Visit. If it has been greater than 24 hours you will need to follow up with your provider, or enter a new e-Visit to address those concerns.  You will get an e-mail in the next two days asking about your experience.  I hope that your e-visit has been valuable and will speed your recovery. Thank you for using e-visits. 5-10 minutes spent reviewing and documenting in chart.

## 2021-02-17 ENCOUNTER — Encounter: Payer: Self-pay | Admitting: Internal Medicine

## 2021-02-17 NOTE — Telephone Encounter (Signed)
I called and LVM for the pt to call me back

## 2021-02-20 ENCOUNTER — Ambulatory Visit (INDEPENDENT_AMBULATORY_CARE_PROVIDER_SITE_OTHER): Payer: Medicaid Other | Admitting: Bariatrics

## 2021-02-26 ENCOUNTER — Encounter: Payer: Self-pay | Admitting: Internal Medicine

## 2021-02-28 ENCOUNTER — Ambulatory Visit: Payer: Medicaid Other | Admitting: Internal Medicine

## 2021-03-05 ENCOUNTER — Other Ambulatory Visit: Payer: Self-pay

## 2021-03-05 ENCOUNTER — Encounter: Payer: Self-pay | Admitting: Adult Health

## 2021-03-05 ENCOUNTER — Other Ambulatory Visit (HOSPITAL_COMMUNITY)
Admission: RE | Admit: 2021-03-05 | Discharge: 2021-03-05 | Disposition: A | Discharge status: Home / Self Care | Payer: Medicaid Other | Source: Ambulatory Visit | Attending: Adult Health | Admitting: Adult Health

## 2021-03-05 ENCOUNTER — Ambulatory Visit (INDEPENDENT_AMBULATORY_CARE_PROVIDER_SITE_OTHER): Payer: Medicaid Other | Admitting: Adult Health

## 2021-03-05 VITALS — BP 113/79 | HR 103 | Ht 61.5 in | Wt 235.5 lb

## 2021-03-05 DIAGNOSIS — Z1211 Encounter for screening for malignant neoplasm of colon: Secondary | ICD-10-CM

## 2021-03-05 DIAGNOSIS — Z0000 Encounter for general adult medical examination without abnormal findings: Secondary | ICD-10-CM | POA: Insufficient documentation

## 2021-03-05 DIAGNOSIS — Z8541 Personal history of malignant neoplasm of cervix uteri: Secondary | ICD-10-CM | POA: Diagnosis not present

## 2021-03-05 DIAGNOSIS — Z90710 Acquired absence of both cervix and uterus: Secondary | ICD-10-CM

## 2021-03-05 DIAGNOSIS — Z08 Encounter for follow-up examination after completed treatment for malignant neoplasm: Secondary | ICD-10-CM | POA: Diagnosis not present

## 2021-03-05 DIAGNOSIS — R1031 Right lower quadrant pain: Secondary | ICD-10-CM

## 2021-03-05 LAB — HEMOCCULT GUIAC POC 1CARD (OFFICE): Fecal Occult Blood, POC: NEGATIVE

## 2021-03-05 NOTE — Progress Notes (Addendum)
Patient ID: April Ayala, female   DOB: 09-Nov-1975, 45 y.o.   MRN: [id number] History of Present Illness: April Ayala is a 45 year old white female, with S), sp hysterectomy in 2013 for cervical cancer, in for well woman gyn exam and pap. She is complaining of RLQ pain. She has had before and had normal [place]. PCP is Dr Posey Pronto.   Current Medications, Allergies, Past Medical History, Past Surgical History, Family History and Social History were reviewed in [employer] record.     Review of Systems: Patient denies any headaches, hearing loss, fatigue, blurred vision, shortness of breath, chest pain, abdominal pain, problems with bowel movements, urination, or intercourse. No joint pain or mood swings.  +RLQ pain,occasional pain with sex   Physical Exam:BP 113/79 (BP Location: Left Arm, Patient Position: Sitting, Cuff Size: Large)   Pulse (!) 103   Ht 5' 1.5" (1.562 m)   Wt 235 lb 8 oz (106.8 kg)   LMP 04/11/2012   BMI 43.78 kg/m   General:  Well developed, well nourished, no acute distress Skin:  Warm and dry Neck:  Midline trachea, normal thyroid, good ROM, no lymphadenopathy Lungs; Clear to auscultation bilaterally Breast:  No dominant palpable mass, retraction, or nipple discharge Cardiovascular: Regular rate and rhythm Abdomen:  Soft, non tender, no hepatosplenomegaly Pelvic:  External genitalia is normal in appearance, no lesions.  The vagina is normal in appearance, no lesions. Urethra has no lesions or masses. The cervix and uterus are absent. Vaginal pap with HR HPV performed.  No adnexal masses or tenderness noted.Bladder is non tender, no masses felt. Rectal: Good sphincter tone, no polyps, or hemorrhoids felt.  Hemoccult negative. Extremities/musculoskeletal:  No swelling or varicosities noted, no clubbing or cyanosis Psych:  No mood changes, alert and cooperative,seems happy AA is 0  Fall risk is low Depression screen [facility] 2/9 03/05/2021 02/06/2021  01/31/2021  Decreased Interest 0 0 0  Down, Depressed, Hopeless 0 0 0  PHQ - 2 Score 0 0 0  Altered sleeping 0 0 -  Tired, decreased energy 0 0 -  Change in appetite 0 0 -  Feeling bad or failure about yourself  0 0 -  Trouble concentrating 0 0 -  Moving slowly or fidgety/restless 0 0 -  Suicidal thoughts 0 0 -  PHQ-9 Score 0 0 -  Difficult doing work/chores - Not difficult at all -  Some recent data might be hidden    GAD 7 : Generalized Anxiety Score 03/05/2021 04/23/2020 12/27/2019 12/18/2019  Nervous, Anxious, on Edge 0 0 0 0  Control/stop worrying 0 0 0 0  Worry too much - different things 0 0 0 0  Trouble relaxing 0 0 0 0  Restless 0 0 0 0  Easily annoyed or irritable 0 0 0 1  Afraid - awful might happen 0 0 0 0  Total GAD 7 Score 0 0 0 1  Anxiety Difficulty - Not difficult at all Not difficult at all Not difficult at all      Upstream - 03/05/21 1437       Pregnancy Intention Screening   Does the patient want to become pregnant in the next year? N/A    Does the patient's partner want to become pregnant in the next year? N/A    Would the patient like to discuss contraceptive options today? N/A      Contraception Wrap Up   Current Method Female Sterilization   hyst   End Method Female  Sterilization   hyst   Contraception Counseling Provided No            Examination chaperoned by Levy Pupa LPN.   Impression and Plan: 1. Routine general medical examination at a health care facility Physical and pap in 1 year Mammogram yearly  Labs with PCP Colonoscopy at 79  2. Vaginal Pap smear following hysterectomy for malignancy Pap sent  3. History of cervical cancer  4. RLQ abdominal pain Will get pelvic US in about 2 weeks   5. Encounter for screening fecal occult blood testing   6. S/P hysterectomy

## 2021-03-06 ENCOUNTER — Ambulatory Visit (INDEPENDENT_AMBULATORY_CARE_PROVIDER_SITE_OTHER): Payer: Medicaid Other | Admitting: Bariatrics

## 2021-03-10 LAB — CYTOLOGY - PAP
Comment: NEGATIVE
Diagnosis: NEGATIVE
High risk HPV: NEGATIVE

## 2021-03-12 ENCOUNTER — Ambulatory Visit: Payer: BC Managed Care – PPO | Admitting: Family Medicine

## 2021-03-13 ENCOUNTER — Other Ambulatory Visit: Payer: Self-pay | Admitting: Internal Medicine

## 2021-03-13 DIAGNOSIS — G43809 Other migraine, not intractable, without status migrainosus: Secondary | ICD-10-CM

## 2021-03-13 DIAGNOSIS — F419 Anxiety disorder, unspecified: Secondary | ICD-10-CM

## 2021-03-17 ENCOUNTER — Encounter: Payer: Self-pay | Admitting: Internal Medicine

## 2021-03-17 ENCOUNTER — Other Ambulatory Visit: Payer: Self-pay

## 2021-03-17 ENCOUNTER — Ambulatory Visit: Payer: Medicaid Other | Admitting: Internal Medicine

## 2021-03-17 VITALS — BP 139/83 | HR 103 | Temp 98.3°F | Resp 18 | Ht 63.0 in | Wt 234.1 lb

## 2021-03-17 DIAGNOSIS — J3089 Other allergic rhinitis: Secondary | ICD-10-CM | POA: Diagnosis not present

## 2021-03-17 DIAGNOSIS — R300 Dysuria: Secondary | ICD-10-CM | POA: Diagnosis not present

## 2021-03-17 DIAGNOSIS — F418 Other specified anxiety disorders: Secondary | ICD-10-CM | POA: Diagnosis not present

## 2021-03-17 DIAGNOSIS — R030 Elevated blood-pressure reading, without diagnosis of hypertension: Secondary | ICD-10-CM | POA: Diagnosis not present

## 2021-03-17 DIAGNOSIS — G43819 Other migraine, intractable, without status migrainosus: Secondary | ICD-10-CM

## 2021-03-17 DIAGNOSIS — F17210 Nicotine dependence, cigarettes, uncomplicated: Secondary | ICD-10-CM

## 2021-03-17 DIAGNOSIS — Z23 Encounter for immunization: Secondary | ICD-10-CM

## 2021-03-17 DIAGNOSIS — Z720 Tobacco use: Secondary | ICD-10-CM

## 2021-03-17 LAB — POCT URINALYSIS DIP (CLINITEK)
Bilirubin, UA: NEGATIVE
Blood, UA: NEGATIVE
Glucose, UA: NEGATIVE mg/dL
Ketones, POC UA: NEGATIVE mg/dL
Leukocytes, UA: NEGATIVE
Nitrite, UA: NEGATIVE
POC PROTEIN,UA: NEGATIVE
Spec Grav, UA: 1.01 (ref 1.010–1.025)
Urobilinogen, UA: 0.2 E.U./dL
pH, UA: 6 (ref 5.0–8.0)

## 2021-03-17 MED ORDER — BUSPIRONE HCL 7.5 MG PO TABS: 7.5000 mg | ORAL_TABLET | Freq: Two times a day (BID) | ORAL | 5 refills | Status: DC

## 2021-03-17 MED ORDER — FLUTICASONE PROPIONATE 50 MCG/ACT NA SUSP: 2.0000 | Freq: Every day | NASAL | 2 refills | Status: DC

## 2021-03-17 NOTE — Patient Instructions (Signed)
Start taking Buspirone 7.5 mg twice daily instead of 5 mg.  Please use Flonase and nasal saline spray for nasal congestion.  Please maintain adequate hydration - about 64 ounces in a day.  Please continue to follow low salt diet and perform moderate exercise/walking at least 150 mins/week.  Continue your efforts to cut down -> quit smoking.

## 2021-03-19 ENCOUNTER — Inpatient Hospital Stay (HOSPITAL_COMMUNITY): Admission: RE | Admit: 2021-03-19 | Payer: Medicaid Other | Source: Ambulatory Visit

## 2021-03-19 DIAGNOSIS — Z1231 Encounter for screening mammogram for malignant neoplasm of breast: Secondary | ICD-10-CM

## 2021-03-21 ENCOUNTER — Encounter: Payer: Self-pay | Admitting: Internal Medicine

## 2021-03-21 NOTE — Assessment & Plan Note (Signed)
Uncontrolled with Buspar, increased dose On Amitriptyline for insomnia Had started Wellbutrin for smoking cessation benefit

## 2021-03-21 NOTE — Assessment & Plan Note (Signed)
Asked about quitting: confirms that she currently smokes cigarettes Advise to quit smoking: Educated about QUITTING to reduce the risk of cancer, cardio and cerebrovascular disease. Assess willingness: Unwilling to quit at this time, but is working on cutting back. Assist with counseling and pharmacotherapy: Counseled for 5 minutes and literature provided. Started Wellbutrin. Advised to use Nicotine patch or gum. Arrange for follow up: follow up in 3 months and continue to offer help.

## 2021-03-21 NOTE — Progress Notes (Signed)
Established Patient Office Visit  Subjective:  Patient ID: April Ayala, female    DOB: February 25, 1976  Age: 45 y.o. MRN: [id number]  CC:  Chief Complaint  Patient presents with   Follow-up    6 month follow up pt thinks she has a UTI she has burning and frequency when she goes this has been going on for awhile     HPI April Ayala is a 45 y.o. female who presents for follow up of her chronic medical conditions.  Anxiety: She has been feeling anxious even while taking Wellbutrin and Elavil.  She has been taking BuSpar 5 mg twice daily.  She denies any SI or HI.  BP is well-controlled. Patient denies headache, dizziness, chest pain, dyspnea or palpitations.  She complains of dysuria, which is intermittent.  She denies any hematuria or flank pain currently.  She denies any fever, chills, nausea or vomiting.  She sees neurologist for migraine.  She is on Emgality and as needed Maxalt.  She also c/o chronic nasal congestion and sore throat. Denies any fever, chills or cough currently.  She received flu vaccine in the office today.  Past Medical History:  Diagnosis Date   Angular cheilitis 07/17/2020   Anxiety    BOOP (bronchiolitis obliterans with organizing pneumonia) ([place]) 07/09/2020   Cervical cancer ([place]) 04/2011   Stage 1B squamous cell   Chronic left shoulder pain 10/07/2017   Chronic tension-type headache, not intractable 10/07/2017   Class 2 obesity 09/29/2019   Cold sore 04/11/2020   Depression    Encounter for screening mammogram for malignant neoplasm of breast 09/29/2019   Encounter for support and coordination of transition of care 07/09/2020   Family history of diabetes mellitus 09/29/2019   Family history of hyperlipidemia 09/29/2019   Family history of hypothyroidism 09/29/2019   High cholesterol    History of cervical cancer 10/27/2019   History of kidney stones    passed   Prehypertension 06/26/2020   Respiratory failure with hypoxia ([place]) 06/30/2020   S/P  lumbar fusion 04/20/2018   Screening for colorectal cancer 12/18/2019   Soreness of tongue 04/23/2020    Past Surgical History:  Procedure Laterality Date   ABDOMINAL HYSTERECTOMY     BREAST BIOPSY Right    CERVICAL CONIZATION W/BX  03/24/2012   Procedure: CONIZATION CERVIX WITH BIOPSY;  Surgeon: Woodroe Mode, MD;  Location: [facility];  Service: Gynecology;  Laterality: N/A;   DIAGNOSTIC LAPAROSCOPY  2007   ectopic   ECTOPIC PREGNANCY SURGERY     ESSURE TUBAL LIGATION  2010   INSERTION OF SUPRAPUBIC CATHETER  05/03/2012   Procedure: INSERTION OF SUPRAPUBIC CATHETER;  Surgeon: Alvino Chapel, MD;  Location: [facility];  Service: Gynecology;;   LEEP     LUMBAR LAMINECTOMY/DECOMPRESSION MICRODISCECTOMY N/A 04/20/2018   Procedure: LUMBAR FIVE TO SACRAL ONE DECOMPRESSION AND FUSION, POSSIBLE LUMBAR FOUR TO SACRAL ONE; SCREW AND CAGE INSTRUMENTATION;  Surgeon: Melina Schools, MD;  Location: [facility];  Service: Orthopedics;  Laterality: N/A;  5 hrs   LYMPHADENECTOMY  05/03/2012   Procedure: LYMPHADENECTOMY;  Surgeon: Alvino Chapel, MD;  Location: [facility];  Service: Gynecology;  Laterality: Bilateral;  pelvic   RADICAL HYSTERECTOMY  05/03/2012   Procedure: RADICAL HYSTERECTOMY;  Surgeon: Alvino Chapel, MD;  Location: [facility];  Service: Gynecology;;  transposition of the ovaries   SPINE SURGERY     TUBAL LIGATION      Family History  Problem Relation Age of Onset  Diabetes Mother    Hypertension Father    Diabetes Sister    Migraines Sister    Hypothyroidism Sister    Migraines Sister    Migraines Sister     Social History   Socioeconomic History   Marital status: Significant Other    Spouse name: Not on file   Number of children: 4   Years of education: Not on file   Highest education level: 12th grade  Occupational History   Occupation: Paediatric nurse  Tobacco Use   Smoking status: Every Day    Packs/day: 0.25    Years: 1.00    Pack years: 0.25    Types:  Cigarettes    Start date: 11/03/2018   Smokeless tobacco: Never   Tobacco comments:    smoking cessation information given  Vaping Use   Vaping Use: Never used  Substance and Sexual Activity   Alcohol use: Never   Drug use: Never   Sexual activity: Yes    Birth control/protection: Surgical    Comment: hyst  Other Topics Concern   Not on file  Social History Narrative   Lives with fiance and 1 daughter      1 son is with grandmother    Oldest daughter is 88 lives with grandfather      Works as a Aeronautical engineer at [employer].     Lives in [place], [place].      6 dogs, a  beard dragon, a snake, and a hamster and a cat      Enjoys: taking care of her pets      Diet: avoids lunch due to work, eats all food groups   Caffeine: monsters in the [employer]: limited      Wears seat belt    Does not use phone while driving    Smoke detectors at home       Right handed   Social Determinants of Health   Financial Resource Strain: Low Risk    Difficulty of Paying Living Expenses: Not hard at all  Food Insecurity: No Food Insecurity   Worried About Charity fundraiser in the Last Year: Never true   Arboriculturist in the Last Year: Never true  Transportation Needs: No Transportation Needs   Lack of Transportation (Medical): No   Lack of Transportation (Non-Medical): No  Physical Activity: Inactive   Days of Exercise per Week: 0 days   Minutes of Exercise per Session: 0 min  Stress: No Stress Concern Present   Feeling of Stress : Only a little  Social Connections: Socially Isolated   Frequency of Communication with Friends and Family: Once a week   Frequency of Social Gatherings with Friends and Family: Once a week   Attends Religious Services: 1 to 4 times per year   Active Member of [employer] or Organizations: No   Attends Music therapist: Never   Marital Status: Divorced  Human resources officer Violence: Not At Risk   Fear of Current or Ex-Partner: No    Emotionally Abused: No   Physically Abused: No   Sexually Abused: No    Outpatient Medications Prior to Visit  Medication Sig Dispense Refill   albuterol (VENTOLIN HFA) 108 (90 Base) MCG/ACT inhaler Inhale 2 puffs into the lungs every 4 (four) hours as needed for wheezing or shortness of breath. 18 g 2   amitriptyline (ELAVIL) 75 MG tablet TAKE 1 TABLET BY MOUTH AT BEDTIME 30 tablet 0   buPROPion [facility]  SR) 150 MG 12 hr tablet Take 1 tablet (150 mg total) by mouth 2 (two) times daily. 60 tablet 2   cyclobenzaprine (FLEXERIL) 10 MG tablet Take 10 mg by mouth 3 (three) times daily.      EMGALITY 120 MG/ML SOAJ INJECT 120 MG INTO THE SKIN EVERY 30 DAYS 1 mL 5   fluticasone furoate-vilanterol (BREO ELLIPTA) 200-25 MCG/INH AEPB Inhale 1 puff into the lungs daily. 30 each 2   meclizine (ANTIVERT) 25 MG tablet Take 1 tablet (25 mg total) by mouth 3 (three) times daily as needed for dizziness. 21 tablet 0   omeprazole (PRILOSEC) 20 MG capsule Take 1 capsule (20 mg total) by mouth daily. 30 capsule 3   pregabalin (LYRICA) 150 MG capsule Take 150 mg by mouth 2 (two) times daily.     rizatriptan (MAXALT-MLT) 10 MG disintegrating tablet Take 1 tablet (10 mg total) by mouth as needed for migraine. May repeat in 2 hours if needed 9 tablet 5   rosuvastatin (CRESTOR) 5 MG tablet Take 1 tablet (5 mg total) by mouth daily. 30 tablet 1   busPIRone (BUSPAR) 5 MG tablet Take 1 tablet (5 mg total) by mouth 2 (two) times daily. 60 tablet 0   fluticasone (FLONASE) 50 MCG/ACT nasal spray Place 2 sprays into both nostrils daily. 16 g 0   oxyCODONE (OXYCONTIN) 10 mg 12 hr tablet Take 10 mg by mouth every 12 (twelve) hours.      oxymorphone (OPANA ER) 10 MG 12 hr tablet Take 10 mg by mouth 2 (two) times daily as needed.     No facility-administered medications prior to visit.    No Known Allergies  ROS Review of Systems  Constitutional:  Negative for chills and fever.  HENT:  Negative for congestion, sinus  pressure, sinus pain and sore throat.   Eyes:  Negative for pain and discharge.  Respiratory:  Negative for cough and shortness of breath.   Cardiovascular:  Negative for chest pain and palpitations.  Gastrointestinal:  Negative for abdominal pain, constipation, diarrhea, nausea and vomiting.  Endocrine: Negative for polydipsia and polyuria.  Genitourinary:  Positive for dysuria. Negative for hematuria.  Musculoskeletal:  Positive for arthralgias and back pain. Negative for neck pain and neck stiffness.  Skin:  Negative for rash.  Neurological:  Negative for dizziness and weakness.  Psychiatric/Behavioral:  Positive for sleep disturbance. Negative for agitation, behavioral problems, self-injury and suicidal ideas. The patient is nervous/anxious.      Objective:    Physical Exam Vitals reviewed.  Constitutional:      General: She is not in acute distress.    Appearance: She is obese. She is not diaphoretic.  HENT:     Head: Normocephalic and atraumatic.     Nose: Nose normal.     Mouth/Throat:     Mouth: Mucous membranes are moist.  Eyes:     General: No scleral icterus.    Extraocular Movements: Extraocular movements intact.  Cardiovascular:     Rate and Rhythm: Normal rate and regular rhythm.     Pulses: Normal pulses.     Heart sounds: Normal heart sounds. No murmur heard. Pulmonary:     Breath sounds: Normal breath sounds. No wheezing or rales.  Abdominal:     Palpations: Abdomen is soft.     Tenderness: There is no abdominal tenderness. There is no right CVA tenderness or left CVA tenderness.  Musculoskeletal:     Cervical back: Neck supple. No tenderness.  Right lower leg: No edema.     Left lower leg: No edema.  Skin:    General: Skin is warm.     Findings: No rash.  Neurological:     General: No focal deficit present.     Mental Status: She is alert and oriented to person, place, and time.  Psychiatric:        Mood and Affect: Mood normal.        Behavior:  Behavior normal.    BP 139/83 (BP Location: Left Arm, Patient Position: Sitting, Cuff Size: Normal)   Pulse (!) 103   Temp 98.3 F (36.8 C) (Oral)   Resp 18   Ht _0  (1.6 m)   Wt 234 lb 1.9 oz (106.2 kg)   LMP 04/11/2012   SpO2 93%   BMI 41.47 kg/m  Wt Readings from Last 3 Encounters:  03/17/21 234 lb 1.9 oz (106.2 kg)  03/05/21 235 lb 8 oz (106.8 kg)  02/12/21 233 lb (105.7 kg)     Health Maintenance Due  Topic Date Due   COVID-19 Vaccine (3 - Booster for [place] series) 08/05/2020   MAMMOGRAM  10/15/2020    There are no preventive care reminders to display for this patient.  Lab Results  Component Value Date   TSH 1.220 09/10/2020   Lab Results  Component Value Date   WBC 11.1 (H) 09/10/2020   HGB 15.3 09/10/2020   HCT 46.4 09/10/2020   MCV 90 09/10/2020   PLT 416 09/10/2020   Lab Results  Component Value Date   NA 140 09/10/2020   K 4.6 09/10/2020   CO2 21 09/10/2020   GLUCOSE 97 09/10/2020   BUN 7 09/10/2020   CREATININE 0.64 09/10/2020   BILITOT 0.3 09/10/2020   ALKPHOS 89 09/10/2020   AST 24 09/10/2020   ALT 21 09/10/2020   PROT 6.4 09/10/2020   ALBUMIN 3.9 09/10/2020   CALCIUM 9.3 09/10/2020   ANIONGAP 10 07/01/2020   EGFR 113 09/10/2020   Lab Results  Component Value Date   CHOL 135 09/10/2020   Lab Results  Component Value Date   HDL 49 09/10/2020   Lab Results  Component Value Date   LDLCALC 67 09/10/2020   Lab Results  Component Value Date   TRIG 102 09/10/2020   Lab Results  Component Value Date   CHOLHDL 2.8 09/10/2020   Lab Results  Component Value Date   HGBA1C 5.2 09/10/2020      Assessment & Plan:   Problem List Items Addressed This Visit       Cardiovascular and Mediastinum   Migraine    On Emgality and Maxalt (PRN) Followed by Neurology        Other   Tobacco abuse    Asked about quitting: confirms that she currently smokes cigarettes Advise to quit smoking: Educated about QUITTING to reduce the  risk of cancer, cardio and cerebrovascular disease. Assess willingness: Unwilling to quit at this time, but is working on cutting back. Assist with counseling and pharmacotherapy: Counseled for 5 minutes and literature provided. Started Wellbutrin. Advised to use Nicotine patch or gum. Arrange for follow up: follow up in 3 months and continue to offer help.      Morbid obesity ([place])    Diet modification and moderate exercise as tolerated      Prehypertension    BP Readings from Last 1 Encounters:  03/17/21 139/83  Elevated due to anxiety Would avoid starting medication for now Advised DASH diet  and moderate exercise/walking, at least 150 mins/week      Anxiety with depression - Primary    Uncontrolled with Buspar, increased dose On Amitriptyline for insomnia Had started Wellbutrin for smoking cessation benefit      Relevant Medications   busPIRone (BUSPAR) 7.5 MG tablet   Other Visit Diagnoses     Dysuria    UA reviewed Unlikely to be UTI Advised to increase fluid intake for now    Relevant Orders   POCT URINALYSIS DIP (CLINITEK) (Completed)   Need for immunization against influenza       Relevant Orders   Flu Vaccine QUAD 29moIM (Fluarix, Fluzone & Alfiuria Quad PF) (Completed)   Allergic rhinitis due to other allergic trigger, unspecified seasonality       Relevant Medications   fluticasone (FLONASE) 50 MCG/ACT nasal spray       Meds ordered this encounter  Medications   busPIRone (BUSPAR) 7.5 MG tablet    Sig: Take 1 tablet (7.5 mg total) by mouth 2 (two) times daily.    Dispense:  60 tablet    Refill:  5   fluticasone (FLONASE) 50 MCG/ACT nasal spray    Sig: Place 2 sprays into both nostrils daily.    Dispense:  16 g    Refill:  2    Follow-up: Return in about 6 months (around 09/14/2021) for Annual physical.    RLindell Spar MD

## 2021-03-21 NOTE — Assessment & Plan Note (Addendum)
BP Readings from Last 1 Encounters:  03/17/21 139/83   Elevated due to anxiety Would avoid starting medication for now Advised DASH diet and moderate exercise/walking, at least 150 mins/week

## 2021-03-21 NOTE — Assessment & Plan Note (Signed)
On Emgality and Maxalt (PRN) Followed by Neurology

## 2021-03-21 NOTE — Assessment & Plan Note (Signed)
Diet modification and moderate exercise as tolerated 

## 2021-03-24 ENCOUNTER — Ambulatory Visit
Admission: EM | Admit: 2021-03-24 | Discharge: 2021-03-24 | Disposition: A | Discharge status: Home / Self Care | Payer: Medicaid Other | Attending: Family Medicine | Admitting: Family Medicine

## 2021-03-24 ENCOUNTER — Other Ambulatory Visit: Payer: Medicaid Other

## 2021-03-24 ENCOUNTER — Other Ambulatory Visit: Payer: Self-pay

## 2021-03-24 DIAGNOSIS — J4521 Mild intermittent asthma with (acute) exacerbation: Secondary | ICD-10-CM

## 2021-03-24 DIAGNOSIS — J069 Acute upper respiratory infection, unspecified: Secondary | ICD-10-CM

## 2021-03-24 MED ORDER — PROMETHAZINE-DM 6.25-15 MG/5ML PO SYRP: 5.0000 mL | ORAL_SOLUTION | Freq: Four times a day (QID) | ORAL | 0 refills | Status: DC | PRN

## 2021-03-24 MED ORDER — ALBUTEROL SULFATE HFA 108 (90 BASE) MCG/ACT IN AERS: 2.0000 | INHALATION_SPRAY | RESPIRATORY_TRACT | 2 refills | Status: DC | PRN

## 2021-03-24 MED ORDER — PREDNISONE 20 MG PO TABS: 40.0000 mg | ORAL_TABLET | Freq: Every day | ORAL | 0 refills | Status: DC

## 2021-03-24 NOTE — ED Provider Notes (Signed)
[facility]    CSN: [id number] Arrival date & time: 03/24/21  1238      History   Chief Complaint No chief complaint on file.   HPI April Ayala is a 45 y.o. female.   Patient presenting today with 1 day history of acute onset cough, congestion, fatigue, wheezing, chest tightness, shortness of breath.  Denies chest pain, fever, body aches, dizziness, headaches, severe shortness of breath.  Not taking anything for symptoms thus far.  Does have history of cigarette smoking and asthmatic bronchitis per patient.  No known sick contacts recently.   Past Medical History:  Diagnosis Date   Angular cheilitis 07/17/2020   Anxiety    BOOP (bronchiolitis obliterans with organizing pneumonia) ([place]) 07/09/2020   Cervical cancer ([place]) 04/2011   Stage 1B squamous cell   Chronic left shoulder pain 10/07/2017   Chronic tension-type headache, not intractable 10/07/2017   Class 2 obesity 09/29/2019   Cold sore 04/11/2020   Depression    Encounter for screening mammogram for malignant neoplasm of breast 09/29/2019   Encounter for support and coordination of transition of care 07/09/2020   Family history of diabetes mellitus 09/29/2019   Family history of hyperlipidemia 09/29/2019   Family history of hypothyroidism 09/29/2019   High cholesterol    History of cervical cancer 10/27/2019   History of kidney stones    passed   Prehypertension 06/26/2020   Respiratory failure with hypoxia ([place]) 06/30/2020   S/P lumbar fusion 04/20/2018   Screening for colorectal cancer 12/18/2019   Soreness of tongue 04/23/2020    Patient Active Problem List   Diagnosis Date Noted   S/P hysterectomy 03/05/2021   Routine general medical examination at a health care facility 03/05/2021   Annual visit for general adult medical examination with abnormal findings 09/10/2020   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   Anxiety with depression 06/30/2020   Prehypertension 06/26/2020   Right tennis elbow 02/21/2020    History of cervical cancer 10/27/2019   Environmental and seasonal allergies 09/29/2019   Tobacco abuse 09/29/2019   Binge eating 09/29/2019   Migraine 09/29/2019   Bilateral carpal tunnel syndrome 09/29/2019   Morbid obesity ([place]) 09/29/2019   Vitamin D deficiency 09/29/2019    Past Surgical History:  Procedure Laterality Date   ABDOMINAL HYSTERECTOMY     BREAST BIOPSY Right    CERVICAL CONIZATION W/BX  03/24/2012   Procedure: CONIZATION CERVIX WITH BIOPSY;  Surgeon: Woodroe Mode, MD;  Location: [facility];  Service: Gynecology;  Laterality: N/A;   DIAGNOSTIC LAPAROSCOPY  2007   ectopic   ECTOPIC PREGNANCY SURGERY     ESSURE TUBAL LIGATION  2010   INSERTION OF SUPRAPUBIC CATHETER  05/03/2012   Procedure: INSERTION OF SUPRAPUBIC CATHETER;  Surgeon: Alvino Chapel, MD;  Location: [facility];  Service: Gynecology;;   LEEP     LUMBAR LAMINECTOMY/DECOMPRESSION MICRODISCECTOMY N/A 04/20/2018   Procedure: LUMBAR FIVE TO SACRAL ONE DECOMPRESSION AND FUSION, POSSIBLE LUMBAR FOUR TO SACRAL ONE; SCREW AND CAGE INSTRUMENTATION;  Surgeon: Melina Schools, MD;  Location: [facility];  Service: Orthopedics;  Laterality: N/A;  5 hrs   LYMPHADENECTOMY  05/03/2012   Procedure: LYMPHADENECTOMY;  Surgeon: Alvino Chapel, MD;  Location: [facility];  Service: Gynecology;  Laterality: Bilateral;  pelvic   RADICAL HYSTERECTOMY  05/03/2012   Procedure: RADICAL HYSTERECTOMY;  Surgeon: Alvino Chapel, MD;  Location: [facility];  Service: Gynecology;;  transposition of the ovaries   SPINE SURGERY     TUBAL LIGATION  OB History     Gravida  4   Para  4   Term  4   Preterm      AB      Living  4      SAB      IAB      Ectopic      Multiple      Live Births               Home Medications    Prior to Admission medications   Medication Sig Start Date End Date Taking? Authorizing Provider  predniSONE (DELTASONE) 20 MG tablet Take 2 tablets (40 mg total) by mouth daily  with breakfast. 03/24/21  Yes Volney American, PA-C  promethazine-dextromethorphan (PROMETHAZINE-DM) 6.25-15 MG/5ML syrup Take 5 mLs by mouth 4 (four) times daily as needed for cough. 03/24/21  Yes Volney American, PA-C  albuterol (VENTOLIN HFA) 108 (90 Base) MCG/ACT inhaler Inhale 2 puffs into the lungs every 4 (four) hours as needed for wheezing or shortness of breath. 03/24/21   Volney American, PA-C  amitriptyline (ELAVIL) 75 MG tablet TAKE 1 TABLET BY MOUTH AT BEDTIME 03/13/21   Lindell Spar, MD  buPROPion [facility] SR) 150 MG 12 hr tablet Take 1 tablet (150 mg total) by mouth 2 (two) times daily. 02/06/21   Lindell Spar, MD  busPIRone (BUSPAR) 7.5 MG tablet Take 1 tablet (7.5 mg total) by mouth 2 (two) times daily. 03/17/21   Lindell Spar, MD  cyclobenzaprine (FLEXERIL) 10 MG tablet Take 10 mg by mouth 3 (three) times daily.     [provider]  EMGALITY 120 MG/ML SOAJ INJECT 120 MG INTO THE SKIN EVERY 30 DAYS 12/24/20   Melvenia Beam, MD  fluticasone [facility]) 50 MCG/ACT nasal spray Place 2 sprays into both nostrils daily. 03/17/21   Lindell Spar, MD  fluticasone furoate-vilanterol (BREO ELLIPTA) 200-25 MCG/INH AEPB Inhale 1 puff into the lungs daily. 07/01/20   Roxan Hockey, MD  meclizine (ANTIVERT) 25 MG tablet Take 1 tablet (25 mg total) by mouth 3 (three) times daily as needed for dizziness. 01/18/21   Waldon Merl, PA-C  omeprazole (PRILOSEC) 20 MG capsule Take 1 capsule (20 mg total) by mouth daily. 02/15/21   Hassell Done, Mary-Margaret, FNP  pregabalin (LYRICA) 150 MG capsule Take 150 mg by mouth 2 (two) times daily. 05/20/20   [provider]  rizatriptan (MAXALT-MLT) 10 MG disintegrating tablet Take 1 tablet (10 mg total) by mouth as needed for migraine. May repeat in 2 hours if needed 08/07/20   Melvenia Beam, MD  rosuvastatin (CRESTOR) 5 MG tablet Take 1 tablet (5 mg total) by mouth daily. 02/10/21   Lindell Spar, MD    Family  History Family History  Problem Relation Age of Onset   Diabetes Mother    Hypertension Father    Diabetes Sister    Migraines Sister    Hypothyroidism Sister    Migraines Sister    Migraines Sister     Social History Social History   Tobacco Use   Smoking status: Every Day    Packs/day: 0.25    Years: 1.00    Pack years: 0.25    Types: Cigarettes    Start date: 11/03/2018   Smokeless tobacco: Never   Tobacco comments:    smoking cessation information given  Vaping Use   Vaping Use: Never used  Substance Use Topics   Alcohol use: Never   Drug  use: Never     Allergies   Patient has no known allergies.   Review of Systems Review of Systems Per HPI  Physical Exam Triage Vital Signs ED Triage Vitals  Enc Vitals Group     BP 03/24/21 1433 130/89     Pulse Rate 03/24/21 1433 (!) 102     Resp 03/24/21 1433 20     Temp 03/24/21 1433 98 F (36.7 C)     Temp src --      SpO2 03/24/21 1433 95 %     Weight --      Height --      Head Circumference --      Peak Flow --      Pain Score 03/24/21 1432 5     Pain Loc --      Pain Edu? --      Excl. in [place]? --    No data found.  Updated Vital Signs BP 130/89   Pulse (!) 102   Temp 98 F (36.7 C)   Resp 20   LMP 04/11/2012   SpO2 95%   Visual Acuity Right Eye Distance:   Left Eye Distance:   Bilateral Distance:    Right Eye Near:   Left Eye Near:    Bilateral Near:     Physical Exam Vitals and nursing note reviewed.  Constitutional:      Appearance: Normal appearance. She is not ill-appearing.  HENT:     Head: Atraumatic.     Right Ear: Tympanic membrane normal.     Left Ear: Tympanic membrane normal.     Nose: Rhinorrhea present.     Mouth/Throat:     Mouth: Mucous membranes are moist.     Pharynx: Oropharynx is clear. Posterior oropharyngeal erythema present.  Eyes:     Extraocular Movements: Extraocular movements intact.     Conjunctiva/sclera: Conjunctivae normal.  Cardiovascular:      Rate and Rhythm: Normal rate and regular rhythm.     Heart sounds: Normal heart sounds.  Pulmonary:     Effort: Pulmonary effort is normal.     Breath sounds: Normal breath sounds. No wheezing or rales.  Abdominal:     General: Bowel sounds are normal. There is no distension.     Palpations: Abdomen is soft.     Tenderness: There is no abdominal tenderness. There is no guarding.  Musculoskeletal:        General: Normal range of motion.     Cervical back: Normal range of motion and neck supple.  Skin:    General: Skin is warm and dry.  Neurological:     Mental Status: She is alert and oriented to person, place, and time.  Psychiatric:        Mood and Affect: Mood normal.        Thought Content: Thought content normal.        Judgment: Judgment normal.   UC Treatments / Results  Labs (all labs ordered are listed, but only abnormal results are displayed) Labs Reviewed  NOVEL CORONAVIRUS, NAA    EKG   Radiology No results found.  Procedures Procedures (including critical care time)  Medications Ordered in UC Medications - No data to display  Initial Impression / Assessment and Plan / UC Course  I have reviewed the triage vital signs and the nursing notes.  Pertinent labs & imaging results that were available during my care of the patient were reviewed by me and considered in my medical  decision making (see chart for details).     Suspect viral upper respiratory infection exacerbating underlying asthma.  Treat with prednisone burst, albuterol inhaler, over-the-counter cold and congestion medications.  COVID PCR pending, quarantine protocol reviewed.  Supportive home care and return precautions reviewed at length.  Final Clinical Impressions(s) / UC Diagnoses   Final diagnoses:  Viral URI with cough  Mild intermittent asthma with acute exacerbation   Discharge Instructions   None    ED Prescriptions     Medication Sig Dispense Auth. Provider   predniSONE  (DELTASONE) 20 MG tablet Take 2 tablets (40 mg total) by mouth daily with breakfast. 10 tablet Volney American, PA-C   albuterol (VENTOLIN HFA) 108 (90 Base) MCG/ACT inhaler Inhale 2 puffs into the lungs every 4 (four) hours as needed for wheezing or shortness of breath. [place], [place]   promethazine-dextromethorphan (PROMETHAZINE-DM) 6.25-15 MG/5ML syrup Take 5 mLs by mouth 4 (four) times daily as needed for cough. 100 mL Volney American, [place]      PDMP not reviewed this encounter.   Volney American, [place] 03/24/21 1447

## 2021-03-24 NOTE — ED Triage Notes (Signed)
Pt presents with c/o chest congestion and wheezing that began yesterday

## 2021-03-25 LAB — SARS-COV-2, NAA 2 DAY TAT

## 2021-03-25 LAB — NOVEL CORONAVIRUS, NAA: SARS-CoV-2, NAA: NOT DETECTED

## 2021-03-26 ENCOUNTER — Other Ambulatory Visit: Payer: Medicaid Other

## 2021-03-29 ENCOUNTER — Telehealth: Payer: Medicaid Other | Admitting: Nurse Practitioner

## 2021-03-29 DIAGNOSIS — J069 Acute upper respiratory infection, unspecified: Secondary | ICD-10-CM

## 2021-03-29 NOTE — Progress Notes (Signed)
E-Visit for Sinus Problems  We are sorry that you are not feeling well.  Here is how we plan to help!  Based on what you have shared with me it looks like you have sinusitis.  Sinusitis is inflammation and infection in the sinus cavities of the head.  Based on your presentation I believe you most likely have Acute Viral Sinusitis.This is an infection most likely caused by a virus. There is not specific treatment for viral sinusitis other than to help you with the symptoms until the infection runs its course.  You may use an oral decongestant such as Mucinex D or if you have glaucoma or high blood pressure use plain Mucinex. Saline nasal spray help and can safely be used as often as needed for congestion, we also recommend continuing to use your Fluticasone nasal spray two sprays in each nostril once a day. If symptoms persist for longer than one week we would encourage a follow up with [place] or your primary care for evaluation of a bacterial sinus infection. We do not recommend using antibiotics prior to symptom length of one week. Overuse of antibiotics can lead to resistance and are not helpful for viral illnesses.   Some authorities believe that zinc sprays or the use of Echinacea may shorten the course of your symptoms.  Sinus infections are not as easily transmitted as other respiratory infection, however we still recommend that you avoid close contact with loved ones, especially the very young and elderly.  Remember to wash your hands thoroughly throughout the day as this is the number one way to prevent the spread of infection!  Home Care: Only take medications as instructed by your medical team. Do not take these medications with alcohol. A steam or ultrasonic humidifier can help congestion.  You can place a towel over your head and breathe in the steam from hot water coming from a faucet. Avoid close contacts especially the very young and the elderly. Cover your mouth when you cough or  sneeze. Always remember to wash your hands.  Get Help Right Away If: You develop worsening fever or sinus pain. You develop a severe head ache or visual changes. Your symptoms persist after you have completed your treatment plan.  Make sure you Understand these instructions. Will watch your condition. Will get help right away if you are not doing well or get worse.   Thank you for choosing an e-visit.  Your e-visit answers were reviewed by a board certified advanced clinical practitioner to complete your personal care plan. Depending upon the condition, your plan could have included both over the counter or prescription medications.  Please review your pharmacy choice. Make sure the pharmacy is open so you can pick up prescription now. If there is a problem, you may contact your provider through [employer] and have the prescription routed to another pharmacy.  Your safety is important to [place]. If you have drug allergies check your prescription carefully.   For the next 24 hours you can use MyChart to ask questions about today's visit, request a non-urgent call back, or ask for a work or school excuse. You will get an email in the next two days asking about your experience. I hope that your e-visit has been valuable and will speed your recovery.    I spent approximately 7 minutes reviewing the patient's history, current symptoms and coordinating their plan of care today.

## 2021-04-01 DIAGNOSIS — J9691 Respiratory failure, unspecified with hypoxia: Secondary | ICD-10-CM | POA: Diagnosis not present

## 2021-04-01 DIAGNOSIS — J84116 Cryptogenic organizing pneumonia: Secondary | ICD-10-CM | POA: Diagnosis not present

## 2021-04-07 DIAGNOSIS — G894 Chronic pain syndrome: Secondary | ICD-10-CM | POA: Diagnosis not present

## 2021-04-09 ENCOUNTER — Telehealth: Payer: Medicaid Other | Admitting: Physician Assistant

## 2021-04-09 DIAGNOSIS — H9201 Otalgia, right ear: Secondary | ICD-10-CM

## 2021-04-09 MED ORDER — AMOXICILLIN 875 MG PO TABS: 875.0000 mg | ORAL_TABLET | Freq: Two times a day (BID) | ORAL | 0 refills | Status: AC

## 2021-04-09 NOTE — Progress Notes (Signed)
I have spent 5 minutes in review of e-visit questionnaire, review and updating patient chart, medical decision making and response to patient.   Kiran Carline Cody Safiyah Cisney, PA-C    

## 2021-04-09 NOTE — Progress Notes (Signed)
E Visit for Swimmer's Ear  We are sorry that you are not feeling well. Here is how we plan to help!  Based on what you have shared with me there are 2 most likely potential causes of your current symptoms.  (1) The symptoms could be solely related to eustachian tube dysfunction secondary to the recent viral sinusitis you were dealing with.  The eustachian tube helps to regulate pressure in the inner ear.  When it is not working as well as it should, pressure and fluid buildup behind the eardrum which can cause sensation of the ear being plugged, pressure/popping and pain.  (2) This could have progressed into a middle ear infection, which thankfully is not too common in adults, but can happen after a viral illness.  This does not seem indicative of a true swimmers ear, which is an external ear infection.  I recommend a few things.  First of all, please continue the Flonase nasal spray you were given at time of your recent visit.  This can help to alleviate some of the pressure in the inner ear and resolve the eustachian tube dysfunction.  You should also consider taking an over-the-counter antihistamine like Claritin or Xyzal to help with this.  If no history of high blood pressure or heart disease, you can actually use a combination antihistamine and decongestant like Claritin-D instead.  In case of a potential secondary ear infection, I am sending in an antibiotic, amoxicillin for you to take twice daily until completed.  Take with food.   Your symptoms should improve over the next 3 days and should resolve in about 7 days.  HOME CARE:  Wash your hands frequently. Do not place the tip of the bottle on your ear or touch it with your fingers. You can take Acetominophen 650 mg every 4-6 hours as needed for pain.  If pain is severe or moderate, you can apply a heating pad (set on low) or hot water bottle (wrapped in a towel) to outer ear for 20 minutes.  This will also increase drainage. Avoid ear  plugs Do not use Q-tips After showers, help the water run out by tilting your head to one side.  GET HELP RIGHT AWAY IF:  Fever is over 102.2 degrees. You develop progressive ear pain or hearing loss. Ear symptoms persist longer than 3 days after treatment.  MAKE SURE YOU:  Understand these instructions. Will watch your condition. Will get help right away if you are not doing well or get worse.  TO PREVENT SWIMMER'S EAR: Use a bathing cap or custom fitted swim molds to keep your ears dry. Towel off after swimming to dry your ears. Tilt your head or pull your earlobes to allow the water to escape your ear canal. If there is still water in your ears, consider using a hairdryer on the lowest setting.   Thank you for choosing an e-visit.  Your e-visit answers were reviewed by a board certified advanced clinical practitioner to complete your personal care plan. Depending upon the condition, your plan could have included both over the counter or prescription medications.  Please review your pharmacy choice. Make sure the pharmacy is open so you can pick up prescription now. If there is a problem, you may contact your provider through [employer] and have the prescription routed to another pharmacy.  Your safety is important to [place]. If you have drug allergies check your prescription carefully.   For the next 24 hours you can use MyChart  to ask questions about today's visit, request a non-urgent call back, or ask for a work or school excuse. You will get an email in the next two days asking about your experience. I hope that your e-visit has been valuable and will speed your recovery.

## 2021-04-14 NOTE — Progress Notes (Signed)
This encounter was created in error - please disregard.

## 2021-04-17 ENCOUNTER — Emergency Department (HOSPITAL_COMMUNITY)
Admission: EM | Admit: 2021-04-17 | Discharge: 2021-04-17 | Disposition: A | Discharge status: Home / Self Care | Payer: Medicaid Other | Attending: Emergency Medicine | Admitting: Emergency Medicine

## 2021-04-17 ENCOUNTER — Emergency Department (HOSPITAL_COMMUNITY): Payer: Medicaid Other

## 2021-04-17 ENCOUNTER — Encounter (HOSPITAL_COMMUNITY): Payer: Self-pay | Admitting: *Deleted

## 2021-04-17 DIAGNOSIS — R072 Precordial pain: Secondary | ICD-10-CM | POA: Diagnosis not present

## 2021-04-17 DIAGNOSIS — I10 Essential (primary) hypertension: Secondary | ICD-10-CM | POA: Diagnosis not present

## 2021-04-17 DIAGNOSIS — F17210 Nicotine dependence, cigarettes, uncomplicated: Secondary | ICD-10-CM | POA: Diagnosis not present

## 2021-04-17 DIAGNOSIS — R000 Tachycardia, unspecified: Secondary | ICD-10-CM | POA: Insufficient documentation

## 2021-04-17 DIAGNOSIS — Z8541 Personal history of malignant neoplasm of cervix uteri: Secondary | ICD-10-CM | POA: Insufficient documentation

## 2021-04-17 DIAGNOSIS — R079 Chest pain, unspecified: Secondary | ICD-10-CM | POA: Diagnosis not present

## 2021-04-17 LAB — BASIC METABOLIC PANEL
Anion gap: 9 (ref 5–15)
BUN: 8 mg/dL (ref 6–20)
CO2: 26 mmol/L (ref 22–32)
Calcium: 8.8 mg/dL — ABNORMAL LOW (ref 8.9–10.3)
Chloride: 102 mmol/L (ref 98–111)
Creatinine, Ser: 0.89 mg/dL (ref 0.44–1.00)
GFR, Estimated: >60 mL/min (ref >60)
Glucose, Bld: 103 mg/dL — ABNORMAL HIGH (ref 70–99)
Potassium: 3.4 mmol/L — ABNORMAL LOW (ref 3.5–5.1)
Sodium: 137 mmol/L (ref 135–145)

## 2021-04-17 LAB — CBC
HCT: 47.3 % — ABNORMAL HIGH (ref 36.0–46.0)
Hemoglobin: 16.1 g/dL — ABNORMAL HIGH (ref 12.0–15.0)
MCH: 31 pg (ref 26.0–34.0)
MCHC: 34 g/dL (ref 30.0–36.0)
MCV: 91 fL (ref 80.0–100.0)
Platelets: 461 10*3/uL — ABNORMAL HIGH (ref 150–400)
RBC: 5.2 MIL/uL — ABNORMAL HIGH (ref 3.87–5.11)
RDW: 14.2 % (ref 11.5–15.5)
WBC: 9.6 10*3/uL (ref 4.0–10.5)
nRBC: 0 % (ref 0.0–0.2)

## 2021-04-17 LAB — TROPONIN I (HIGH SENSITIVITY)
Troponin I (High Sensitivity): 3 ng/L (ref <18)
Troponin I (High Sensitivity): 3 ng/L (ref <18)

## 2021-04-17 LAB — HCG, SERUM, QUALITATIVE: Preg, Serum: NEGATIVE

## 2021-04-17 NOTE — ED Provider Notes (Signed)
Emergency Department Provider Note   I have reviewed the triage vital signs and the nursing notes.   HISTORY  Chief Complaint Chest Pain   HPI April Ayala is a 45 y.o. female with past medical history reviewed below presents emergency department for evaluation of central chest tightness.  She states this began in the setting of some emotional distress with her daughter.  She will not elaborate on the exact incident but notes that symptoms began at this time.  She is not feeling short of breath.  She describes the pain as sharp but also pressure in nature.  No radiation of symptoms or other modifying factors.  Symptoms have improved with calming down but decided to come in for evaluation given severity at the time of onset.  No nausea or vomiting.  No diaphoresis.  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   Cervical cancer ([place]) 04/2011   Stage 1B squamous cell   Chronic left shoulder pain 10/07/2017   Chronic tension-type headache, not intractable 10/07/2017   Class 2 obesity 09/29/2019   Cold sore 04/11/2020   Depression    Encounter for screening mammogram for malignant neoplasm of breast 09/29/2019   Encounter for support and coordination of transition of care 07/09/2020   Family history of diabetes mellitus 09/29/2019   Family history of hyperlipidemia 09/29/2019   Family history of hypothyroidism 09/29/2019   High cholesterol    History of cervical cancer 10/27/2019   History of kidney stones    passed   Prehypertension 06/26/2020   Respiratory failure with hypoxia ([place]) 06/30/2020   S/P lumbar fusion 04/20/2018   Screening for colorectal cancer 12/18/2019   Soreness of tongue 04/23/2020    Patient Active Problem List   Diagnosis Date Noted   S/P hysterectomy 03/05/2021   Routine general medical examination at a health care facility 03/05/2021   Annual  visit for general adult medical examination with abnormal findings 09/10/2020   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   Anxiety with depression 06/30/2020   Prehypertension 06/26/2020   Right tennis elbow 02/21/2020   History of cervical cancer 10/27/2019   Environmental and seasonal allergies 09/29/2019   Tobacco abuse 09/29/2019   Binge eating 09/29/2019   Migraine 09/29/2019   Bilateral carpal tunnel syndrome 09/29/2019   Morbid obesity ([place]) 09/29/2019   Vitamin D deficiency 09/29/2019    Past Surgical History:  Procedure Laterality Date   ABDOMINAL HYSTERECTOMY     BREAST BIOPSY Right    CERVICAL CONIZATION W/BX  03/24/2012   Procedure: CONIZATION CERVIX WITH BIOPSY;  Surgeon: Woodroe Mode, MD;  Location: [facility];  Service: Gynecology;  Laterality: N/A;   DIAGNOSTIC LAPAROSCOPY  2007   ectopic   ECTOPIC PREGNANCY SURGERY     ESSURE TUBAL LIGATION  2010   INSERTION OF SUPRAPUBIC CATHETER  05/03/2012   Procedure: INSERTION OF SUPRAPUBIC CATHETER;  Surgeon: Alvino Chapel, MD;  Location: [facility];  Service: Gynecology;;   LEEP     LUMBAR LAMINECTOMY/DECOMPRESSION MICRODISCECTOMY N/A 04/20/2018   Procedure: LUMBAR FIVE TO SACRAL ONE DECOMPRESSION AND FUSION, POSSIBLE LUMBAR FOUR TO SACRAL ONE; SCREW AND CAGE INSTRUMENTATION;  Surgeon: Melina Schools, MD;  Location: [facility];  Service: Orthopedics;  Laterality: N/A;  5 hrs   LYMPHADENECTOMY  05/03/2012   Procedure: LYMPHADENECTOMY;  Surgeon: Alvino Chapel, MD;  Location: [facility];  Service: Gynecology;  Laterality: Bilateral;  pelvic   RADICAL HYSTERECTOMY  05/03/2012   Procedure: RADICAL HYSTERECTOMY;  Surgeon: Alvino Chapel, MD;  Location: [facility];  Service: Gynecology;;  transposition of the ovaries   SPINE SURGERY     TUBAL LIGATION      Allergies Patient has no known allergies.  Family History  Problem Relation Age of Onset   Diabetes Mother    Hypertension Father    Diabetes Sister    Migraines  Sister    Hypothyroidism Sister    Migraines Sister    Migraines Sister     Social History Social History   Tobacco Use   Smoking status: Every Day    Packs/day: 0.25    Years: 1.00    Pack years: 0.25    Types: Cigarettes    Start date: 11/03/2018   Smokeless tobacco: Never   Tobacco comments:    smoking cessation information given  Vaping Use   Vaping Use: Never used  Substance Use Topics   Alcohol use: Never   Drug use: Never    Review of Systems  Constitutional: No fever/chills Eyes: No visual changes. ENT: No sore throat. Cardiovascular: Positive chest pain. Respiratory: Denies shortness of breath. Gastrointestinal: No abdominal pain.  No nausea, no vomiting.  No diarrhea.  No constipation. Genitourinary: Negative for dysuria. Musculoskeletal: Negative for back pain. Skin: Negative for rash. Neurological: Negative for headaches, focal weakness or numbness.  10-point ROS otherwise negative.  ____________________________________________   PHYSICAL EXAM:  VITAL SIGNS: ED Triage Vitals  Enc Vitals Group     BP 04/17/21 1552 (!) 126/93     Pulse Rate 04/17/21 1552 (!) 111     Resp 04/17/21 1552 20     Temp 04/17/21 1552 98.1 F (36.7 C)     Temp Source 04/17/21 1552 Oral     SpO2 04/17/21 1552 97 %     Weight --      Height 04/17/21 1558 5' (1.524 m)   Constitutional: Alert and oriented. Well appearing and in no acute distress. Eyes: Conjunctivae are normal.  Head: Atraumatic. Nose: No congestion/rhinnorhea. Mouth/Throat: Mucous membranes are moist.   Neck: No stridor.  Cardiovascular: Normal rate, regular rhythm. Good peripheral circulation. Grossly normal heart sounds.   Respiratory: Normal respiratory effort.  No retractions. Lungs CTAB. Gastrointestinal: Soft and nontender. No distention.  Musculoskeletal: No lower extremity tenderness nor edema. No gross deformities of extremities. Neurologic:  Normal speech and language. No gross focal  neurologic deficits are appreciated.  Skin:  Skin is warm, dry and intact. No rash noted.  ____________________________________________   LABS (all labs ordered are listed, but only abnormal results are displayed)  Labs Reviewed  BASIC METABOLIC PANEL - Abnormal; Notable for the following components:      Result Value   Potassium 3.4 (*)    Glucose, Bld 103 (*)    Calcium 8.8 (*)    All other components within normal limits  CBC - Abnormal; Notable for the following components:   RBC 5.20 (*)    Hemoglobin 16.1 (*)    HCT 47.3 (*)    Platelets 461 (*)    All other components within normal limits  HCG, SERUM, QUALITATIVE  TROPONIN I (HIGH SENSITIVITY)  TROPONIN I (HIGH SENSITIVITY)   ____________________________________________  EKG   EKG Interpretation  Date/Time:  Thursday April 17 2021 15:54:43 EDT Ventricular Rate:  120 PR Interval:  134 QRS Duration: 82 QT Interval:  312 QTC Calculation: 440 R Axis:   83 Text Interpretation:  Sinus tachycardia Otherwise normal ECG Confirmed by Nanda Quinton [phone]) on 04/17/2021 4:00:56 PM        ____________________________________________  RADIOLOGY  DG Chest 2 View  Result Date: 04/17/2021 CLINICAL DATA:  Chest pain left arm pain EXAM: CHEST - 2 VIEW COMPARISON:  06/29/2020 FINDINGS: The heart size and mediastinal contours are within normal limits. Both lungs are clear. The visualized skeletal structures are unremarkable. IMPRESSION: No active cardiopulmonary disease. Electronically Signed   By: Franchot Gallo M.D.   On: 04/17/2021 16:39    ____________________________________________   PROCEDURES  Procedure(s) performed:   Procedures  None  ____________________________________________   INITIAL IMPRESSION / ASSESSMENT AND PLAN / ED COURSE  Pertinent labs & imaging results that were available during my care of the patient were reviewed by me and considered in my medical decision making (see chart for  details).   Patient presents emergency department for evaluation of fairly atypical chest pain starting in the setting of some emotional distress.  She had lab work and chest x-ray drawn in triage which is reassuring.  Given the somewhat sharp nature of the pain along with mild tachycardia on arrival I did advise adding a D-dimer but patient does not want to wait for this test..  She states she is feeling improved and would like to be discharged.  This does seem reasonable but we discussed the risk/benefit of leaving prior to D-dimer to fully evaluate for life-threatening causes of chest pain.  Advised strict ED return precautions along with PCP follow-up plan.   ____________________________________________  FINAL CLINICAL IMPRESSION(S) / ED DIAGNOSES  Final diagnoses:  Precordial chest pain     Note:  This document was prepared using Dragon voice recognition software and may include unintentional dictation errors.  Nanda Quinton, MD, [facility] Emergency Medicine    Corynne Scibilia, [facility], MD 04/23/21 [phone]

## 2021-04-17 NOTE — ED Provider Notes (Signed)
Emergency Medicine Provider Triage Evaluation Note  April Ayala , a 46 y.o. female  was evaluated in triage.  Pt complains of chest pain x20 min. Mild sob, chronic cough.  Review of Systems  Positive: Cp, sob, cough Negative: fever  Physical Exam  BP (!) 126/93   Pulse (!) 111   Temp 98.1 F (36.7 C) (Oral)   Resp 20   Ht 5' (1.524 m)   LMP 04/11/2012   SpO2 97%   BMI 45.72 kg/m  Gen:   Awake, no distress   Resp:  Normal effort  MSK:   Moves extremities without difficulty  Other:  Lungs ctab, heart with rrr  Medical Decision Making  Medically screening exam initiated at 4:36 PM.  Appropriate orders placed.  April Ayala was informed that the remainder of the evaluation will be completed by another provider, this initial triage assessment does not replace that evaluation, and the importance of remaining in the ED until their evaluation is complete.     Rodney Booze, [place] 04/17/21 1637    Margette Fast, MD 04/23/21 [phone]

## 2021-04-17 NOTE — ED Triage Notes (Signed)
C/o chest pain after a situation happened with her daughter

## 2021-04-17 NOTE — Discharge Instructions (Signed)

## 2021-04-20 ENCOUNTER — Encounter: Payer: Self-pay | Admitting: Internal Medicine

## 2021-04-22 ENCOUNTER — Telehealth: Payer: Medicaid Other | Admitting: Physician Assistant

## 2021-04-22 ENCOUNTER — Encounter: Payer: Self-pay | Admitting: Internal Medicine

## 2021-04-22 DIAGNOSIS — R399 Unspecified symptoms and signs involving the genitourinary system: Secondary | ICD-10-CM

## 2021-04-22 MED ORDER — CEPHALEXIN 500 MG PO CAPS: 500.0000 mg | ORAL_CAPSULE | Freq: Two times a day (BID) | ORAL | 0 refills | Status: AC

## 2021-04-22 NOTE — Progress Notes (Signed)

## 2021-04-22 NOTE — Progress Notes (Signed)
I have spent 5 minutes in review of e-visit questionnaire, review and updating patient chart, medical decision making and response to patient.   Kalonji Zurawski Cody Dornell Grasmick, PA-C    

## 2021-04-22 NOTE — Progress Notes (Signed)
Message sent to patient requesting further input regarding current symptoms. Awaiting patient response.  

## 2021-04-22 NOTE — Addendum Note (Signed)
Addended by: Brunetta Jeans on: 04/22/2021 01:05 PM   Modules accepted: Orders

## 2021-05-01 DIAGNOSIS — J84116 Cryptogenic organizing pneumonia: Secondary | ICD-10-CM | POA: Diagnosis not present

## 2021-05-01 DIAGNOSIS — J9691 Respiratory failure, unspecified with hypoxia: Secondary | ICD-10-CM | POA: Diagnosis not present

## 2021-05-05 ENCOUNTER — Other Ambulatory Visit: Payer: Self-pay

## 2021-05-05 ENCOUNTER — Telehealth: Payer: Medicaid Other | Admitting: Internal Medicine

## 2021-05-06 ENCOUNTER — Telehealth (INDEPENDENT_AMBULATORY_CARE_PROVIDER_SITE_OTHER): Payer: Medicaid Other | Admitting: Adult Health

## 2021-05-06 DIAGNOSIS — G43709 Chronic migraine without aura, not intractable, without status migrainosus: Secondary | ICD-10-CM | POA: Diagnosis not present

## 2021-05-06 MED ORDER — EMGALITY 120 MG/ML ~~LOC~~ SOAJ: SUBCUTANEOUS | 11 refills | Status: DC

## 2021-05-06 MED ORDER — RIZATRIPTAN BENZOATE 10 MG PO TBDP: 10.0000 mg | ORAL_TABLET | ORAL | 5 refills | Status: DC | PRN

## 2021-05-06 NOTE — Progress Notes (Signed)
PATIENT: April Ayala DOB: 10/04/1975  REASON FOR VISIT: follow up HISTORY FROM: patient  Virtual Visit via Video Note  I connected with April Ayala on 05/06/21 at  3:00 PM EDT by a video enabled telemedicine application located remotely at [facility] and verified that I am speaking with the correct person using two identifiers who was located at their own home.   I discussed the limitations of evaluation and management by telemedicine and the availability of in person appointments. The patient expressed understanding and agreed to proceed.   PATIENT: April Ayala DOB: 04/18/1976  REASON FOR VISIT: follow up HISTORY FROM: patient  HISTORY OF PRESENT ILLNESS: Today 05/06/21:  April Ayala is a 45 year old female with a history of migraine headaches.  She returns today for follow-up.  She states that her headaches continue to be well controlled with Emgality.  She has approximately 2 headaches a month that respond well to rizatriptan.  She denies any new symptoms.  She returns today for an evaluation.  05/08/20: April Ayala is a 45 year old female with a history of migraine headaches.  She returns today for follow-up.  She reports that her headaches have been under great control with Emgality.  She may have 2 headaches a month.  Typically she finds good benefit with Maxalt.  On occasion she will have to repeat her dose of Maxalt for persistent headache.  Overall she is very pleased with her migraine management.  HISTORY FUSAE FLORIO is a 45 y.o. female here as requested by Perlie Mayo, NP for Carpal Tunnel and Migraines, past medical history of lumbar fusion, kidney stones, chronic headaches, chronic left shoulder pain, migraines, carpal tunnel bilaterally, fatigue, tobacco use, obesity.  I reviewed [place] [place] notes, she has been managed for her migraines, she was tried on Topamax with little success, she is on amitriptyline now which  has helped, she is in an active phase of weight loss.    Patient is here alone: She has had migraines for 4 years, all her sisters have migraine, no inciting event, stress can trigger and make them worse, she wakes up with headaches every day, she has CTS and her hands hurt, numbness and tingling, she had an emg and she was supposed to have surgery, she deny any weakness or snoring, no excessive daytime sleepiness. She has daily headaches for over a year, she has sharp pains behind the left head, headaches are pounding/pulsating, she has to go into a dark room, sound bothers her and makes it wore, amitriptyline helps, she has had a hysterectomy, she takes percocet 3x a day and so take a laxative but has no constipation due to treatment, nausea and vomiting. The headaches are worse when laying down and she can barely get to sleep. No aura. She Is still smoking.No other focal neurologic deficits, associated symptoms, inciting events or modifiable factors.   09/2019: Hgba1c,tsh,cmp normal reviewed   Reviewed notes, labs and imaging from outside physicians, which showed: reviewed EMG/NCS report from 2019, data and agree with the following:   From a thorough review of records, medications tried that can be used in migraine management include : Topamax, amitriptyline, flexeril, lyrica, maxalt, tylenol, baclofen, fioricet, neurontin, indomethacin, ibuprofen, toradol, robaxin, meoprolol, naproxen, zofran, phenergan, maxalt, trmadol    REVIEW OF SYSTEMS: Out of a complete 14 system review of symptoms, the patient complains only of the following symptoms, and all other reviewed systems are negative.  See HPI  ALLERGIES: No  Known Allergies  HOME MEDICATIONS: Outpatient Medications Prior to Visit  Medication Sig Dispense Refill   albuterol (VENTOLIN HFA) 108 (90 Base) MCG/ACT inhaler Inhale 2 puffs into the lungs every 4 (four) hours as needed for wheezing or shortness of breath. 18 g 2   amitriptyline  (ELAVIL) 75 MG tablet TAKE 1 TABLET BY MOUTH AT BEDTIME 30 tablet 0   buPROPion (WELLBUTRIN SR) 150 MG 12 hr tablet Take 1 tablet (150 mg total) by mouth 2 (two) times daily. 60 tablet 2   busPIRone (BUSPAR) 7.5 MG tablet Take 1 tablet (7.5 mg total) by mouth 2 (two) times daily. 60 tablet 5   cyclobenzaprine (FLEXERIL) 10 MG tablet Take 10 mg by mouth 3 (three) times daily.      EMGALITY 120 MG/ML SOAJ INJECT 120 MG INTO THE SKIN EVERY 30 DAYS 1 mL 5   fluticasone (FLONASE) 50 MCG/ACT nasal spray Place 2 sprays into both nostrils daily. 16 g 2   fluticasone furoate-vilanterol (BREO ELLIPTA) 200-25 MCG/INH AEPB Inhale 1 puff into the lungs daily. 30 each 2   meclizine (ANTIVERT) 25 MG tablet Take 1 tablet (25 mg total) by mouth 3 (three) times daily as needed for dizziness. 21 tablet 0   omeprazole (PRILOSEC) 20 MG capsule Take 1 capsule (20 mg total) by mouth daily. 30 capsule 3   predniSONE (DELTASONE) 20 MG tablet Take 2 tablets (40 mg total) by mouth daily with breakfast. 10 tablet 0   pregabalin (LYRICA) 150 MG capsule Take 150 mg by mouth 2 (two) times daily.     promethazine-dextromethorphan (PROMETHAZINE-DM) 6.25-15 MG/5ML syrup Take 5 mLs by mouth 4 (four) times daily as needed for cough. 100 mL 0   rizatriptan (MAXALT-MLT) 10 MG disintegrating tablet Take 1 tablet (10 mg total) by mouth as needed for migraine. May repeat in 2 hours if needed 9 tablet 5   rosuvastatin (CRESTOR) 5 MG tablet Take 1 tablet (5 mg total) by mouth daily. 30 tablet 1   No facility-administered medications prior to visit.    PAST MEDICAL HISTORY: Past Medical History:  Diagnosis Date   Angular cheilitis 07/17/2020   Anxiety    BOOP (bronchiolitis obliterans with organizing pneumonia) ([place]) 07/09/2020   Cervical cancer ([place]) 04/2011   Stage 1B squamous cell   Chronic left shoulder pain 10/07/2017   Chronic tension-type headache, not intractable 10/07/2017   Class 2 obesity 09/29/2019   Cold sore 04/11/2020    Depression    Encounter for screening mammogram for malignant neoplasm of breast 09/29/2019   Encounter for support and coordination of transition of care 07/09/2020   Family history of diabetes mellitus 09/29/2019   Family history of hyperlipidemia 09/29/2019   Family history of hypothyroidism 09/29/2019   High cholesterol    History of cervical cancer 10/27/2019   History of kidney stones    passed   Prehypertension 06/26/2020   Respiratory failure with hypoxia ([place]) 06/30/2020   S/P lumbar fusion 04/20/2018   Screening for colorectal cancer 12/18/2019   Soreness of tongue 04/23/2020    PAST SURGICAL HISTORY: Past Surgical History:  Procedure Laterality Date   ABDOMINAL HYSTERECTOMY     BREAST BIOPSY Right    CERVICAL CONIZATION W/BX  03/24/2012   Procedure: CONIZATION CERVIX WITH BIOPSY;  Surgeon: Woodroe Mode, MD;  Location: [facility];  Service: Gynecology;  Laterality: N/A;   DIAGNOSTIC LAPAROSCOPY  2007   ectopic   ECTOPIC PREGNANCY SURGERY     ESSURE TUBAL LIGATION  2010  INSERTION OF SUPRAPUBIC CATHETER  05/03/2012   Procedure: INSERTION OF SUPRAPUBIC CATHETER;  Surgeon: Alvino Chapel, MD;  Location: [facility];  Service: Gynecology;;   LEEP     LUMBAR LAMINECTOMY/DECOMPRESSION MICRODISCECTOMY N/A 04/20/2018   Procedure: LUMBAR FIVE TO SACRAL ONE DECOMPRESSION AND FUSION, POSSIBLE LUMBAR FOUR TO SACRAL ONE; SCREW AND CAGE INSTRUMENTATION;  Surgeon: Melina Schools, MD;  Location: [facility];  Service: Orthopedics;  Laterality: N/A;  5 hrs   LYMPHADENECTOMY  05/03/2012   Procedure: LYMPHADENECTOMY;  Surgeon: Alvino Chapel, MD;  Location: [facility];  Service: Gynecology;  Laterality: Bilateral;  pelvic   RADICAL HYSTERECTOMY  05/03/2012   Procedure: RADICAL HYSTERECTOMY;  Surgeon: Alvino Chapel, MD;  Location: [facility];  Service: Gynecology;;  transposition of the ovaries   SPINE SURGERY     TUBAL LIGATION      FAMILY HISTORY: Family History  Problem Relation  Age of Onset   Diabetes Mother    Hypertension Father    Diabetes Sister    Migraines Sister    Hypothyroidism Sister    Migraines Sister    Migraines Sister     SOCIAL HISTORY: Social History   Socioeconomic History   Marital status: Significant Other    Spouse name: Not on file   Number of children: 4   Years of education: Not on file   Highest education level: 12th grade  Occupational History   Occupation: Paediatric nurse  Tobacco Use   Smoking status: Every Day    Packs/day: 0.25    Years: 1.00    Pack years: 0.25    Types: Cigarettes    Start date: 11/03/2018   Smokeless tobacco: Never   Tobacco comments:    smoking cessation information given  Vaping Use   Vaping Use: Never used  Substance and Sexual Activity   Alcohol use: Never   Drug use: Never   Sexual activity: Yes    Birth control/protection: Surgical    Comment: hyst  Other Topics Concern   Not on file  Social History Narrative   Lives with fiance and 1 daughter      1 son is with grandmother    Oldest daughter is 13 lives with grandfather      Works as a Aeronautical engineer at [employer].     Lives in [place], [place].      6 dogs, a  beard dragon, a snake, and a hamster and a cat      Enjoys: taking care of her pets      Diet: avoids lunch due to work, eats all food groups   Caffeine: monsters in the [employer]: limited      Wears seat belt    Does not use phone while driving    Smoke detectors at home       Right handed   Social Determinants of Health   Financial Resource Strain: Low Risk    Difficulty of Paying Living Expenses: Not hard at all  Food Insecurity: No Food Insecurity   Worried About Charity fundraiser in the Last Year: Never true   Arboriculturist in the Last Year: Never true  Transportation Needs: No Transportation Needs   Lack of Transportation (Medical): No   Lack of Transportation (Non-Medical): No  Physical Activity: Inactive   Days of Exercise per Week: 0 days    Minutes of Exercise per Session: 0 min  Stress: No Stress Concern Present   Feeling of Stress :  Only a little  Social Connections: Socially Isolated   Frequency of Communication with Friends and Family: Once a week   Frequency of Social Gatherings with Friends and Family: Once a week   Attends Religious Services: 1 to 4 times per year   Active Member of [employer] or Organizations: No   Attends Archivist Meetings: Never   Marital Status: Divorced  Human resources officer Violence: Not At Risk   Fear of Current or Ex-Partner: No   Emotionally Abused: No   Physically Abused: No   Sexually Abused: No      PHYSICAL EXAM Generalized: Well developed, in no acute distress   Neurological examination  Mentation: Alert oriented to time, place, history taking. Follows all commands speech and language fluent Cranial nerve II-XII:Extraocular movements were full. Facial symmetry noted. uvula tongue midline. Head turning and shoulder shrug  were normal and symmetric. Motor: Good strength throughout subjectively per patient Sensory: Sensory testing is intact to soft touch on all 4 extremities subjectively per patient Coordination: Cerebellar testing reveals good finger-nose-finger  Gait and station: Patient is able to stand from a seated position. gait is normal.  Reflexes: UTA  DIAGNOSTIC DATA (LABS, IMAGING, TESTING) - I reviewed patient records, labs, notes, testing and imaging myself where available.  Lab Results  Component Value Date   WBC 9.6 04/17/2021   HGB 16.1 (H) 04/17/2021   HCT 47.3 (H) 04/17/2021   MCV 91.0 04/17/2021   PLT 461 (H) 04/17/2021      Component Value Date/Time   NA 137 04/17/2021 1651   NA 140 09/10/2020 1141   K 3.4 (L) 04/17/2021 1651   CL 102 04/17/2021 1651   CO2 26 04/17/2021 1651   GLUCOSE 103 (H) 04/17/2021 1651   BUN 8 04/17/2021 1651   BUN 7 09/10/2020 1141   CREATININE 0.89 04/17/2021 1651   CREATININE 0.97 04/24/2020 1004   CALCIUM 8.8 (L)  04/17/2021 1651   PROT 6.4 09/10/2020 1141   ALBUMIN 3.9 09/10/2020 1141   AST 24 09/10/2020 1141   ALT 21 09/10/2020 1141   ALKPHOS 89 09/10/2020 1141   BILITOT 0.3 09/10/2020 1141   GFRNONAA >60 04/17/2021 1651   GFRNONAA 72 04/24/2020 1004   GFRAA 83 04/24/2020 1004   Lab Results  Component Value Date   CHOL 135 09/10/2020   HDL 49 09/10/2020   LDLCALC 67 09/10/2020   TRIG 102 09/10/2020   CHOLHDL 2.8 09/10/2020   Lab Results  Component Value Date   HGBA1C 5.2 09/10/2020   Lab Results  Component Value Date   XBJYNWGN56 213 04/24/2020   Lab Results  Component Value Date   TSH 1.220 09/10/2020      ASSESSMENT AND PLAN 45 y.o. year old female  has a past medical history of Angular cheilitis (07/17/2020), Anxiety, BOOP (bronchiolitis obliterans with organizing pneumonia) ([place]) (07/09/2020), Cervical cancer ([place]) (04/2011), Chronic left shoulder pain (10/07/2017), Chronic tension-type headache, not intractable (10/07/2017), Class 2 obesity (09/29/2019), Cold sore (04/11/2020), Depression, Encounter for screening mammogram for malignant neoplasm of breast (09/29/2019), Encounter for support and coordination of transition of care (07/09/2020), Family history of diabetes mellitus (09/29/2019), Family history of hyperlipidemia (09/29/2019), Family history of hypothyroidism (09/29/2019), High cholesterol, History of cervical cancer (10/27/2019), History of kidney stones, Prehypertension (06/26/2020), Respiratory failure with hypoxia ([place]) (06/30/2020), S/P lumbar fusion (04/20/2018), Screening for colorectal cancer (12/18/2019), and Soreness of tongue (04/23/2020). here with:  1.  Migraine headaches  -Continue Emgality as preventative treatment -Continue Maxalt for abortive treatment -Follow-up  in 1 year or sooner if needed      Ward Givens, MSN, NP-C 05/06/2021, 3:09 PM [facility] [address] [place], [place] [address] [phone]

## 2021-05-09 ENCOUNTER — Other Ambulatory Visit: Payer: Self-pay | Admitting: Internal Medicine

## 2021-05-09 ENCOUNTER — Other Ambulatory Visit (HOSPITAL_COMMUNITY): Payer: Self-pay | Admitting: Internal Medicine

## 2021-05-09 DIAGNOSIS — G43809 Other migraine, not intractable, without status migrainosus: Secondary | ICD-10-CM

## 2021-05-09 DIAGNOSIS — F419 Anxiety disorder, unspecified: Secondary | ICD-10-CM

## 2021-05-09 DIAGNOSIS — Z1231 Encounter for screening mammogram for malignant neoplasm of breast: Secondary | ICD-10-CM

## 2021-05-12 ENCOUNTER — Other Ambulatory Visit: Payer: Self-pay

## 2021-05-12 ENCOUNTER — Telehealth: Payer: Medicaid Other | Admitting: Internal Medicine

## 2021-05-13 ENCOUNTER — Telehealth: Payer: Medicaid Other | Admitting: Adult Health

## 2021-05-16 ENCOUNTER — Telehealth: Payer: Medicaid Other | Admitting: Emergency Medicine

## 2021-05-16 DIAGNOSIS — R300 Dysuria: Secondary | ICD-10-CM

## 2021-05-16 NOTE — Progress Notes (Signed)
Pt with 2nd UTI evisit in less than a month. I feel needs to be seen in person for her problem.   Based on what you shared with me, I feel your condition warrants further evaluation and I recommend that you be seen in a face to face visit.   NOTE: There will be NO CHARGE for this eVisit   If you are having a true medical emergency please call 911.      For an urgent face to face visit, [facility] has six urgent care centers for your convenience:     [facility] [facility] [place] Get Driving Directions [phone] [place], [place] [address]    [facility]) Get Driving Directions [phone] [place], [place] [address]  [facility] [place] [facility]) Get Driving Directions [phone] [address] [place],  [place]  [address]  [facility] Get Driving Directions [phone] [address] [place], [place], [place] [address]   [facility] Get Driving Directions  [phone] [address] [place], [place] [address]   [facility] [place] Get Driving Directions [phone] [address], [place], [place] [address]  Your MyChart E-visit questionnaire answers were reviewed by a board certified advanced clinical practitioner to complete your personal care plan based on your specific symptoms.  Thank you for using e-Visits.

## 2021-05-20 ENCOUNTER — Ambulatory Visit: Payer: Medicaid Other | Admitting: Family Medicine

## 2021-05-20 ENCOUNTER — Encounter: Payer: Self-pay | Admitting: Family Medicine

## 2021-05-20 ENCOUNTER — Other Ambulatory Visit: Payer: Self-pay

## 2021-05-20 VITALS — BP 114/77 | HR 101 | Resp 16 | Ht 60.0 in | Wt 228.4 lb

## 2021-05-20 DIAGNOSIS — M79671 Pain in right foot: Secondary | ICD-10-CM

## 2021-05-20 DIAGNOSIS — Z720 Tobacco use: Secondary | ICD-10-CM

## 2021-05-20 MED ORDER — NAPROXEN 500 MG PO TABS: 500.0000 mg | ORAL_TABLET | Freq: Two times a day (BID) | ORAL | 0 refills | Status: DC

## 2021-05-20 NOTE — Patient Instructions (Addendum)
F/U with dr Posey Pronto as before  You are referred to Dr Aline Brochure re right foot pain, please give appointment at checkout if able ( may also call pt on her cell)  Congrats on cutting down to 3 to 4 ciggs/ day     5 day course of naproxen is prescribed for the pain in your foot  Thanks for choosing [facility], we consider it a privelige to serve you.

## 2021-05-20 NOTE — Progress Notes (Signed)
   April Ayala     MRN: [id number]      DOB: [birth year]        HPI April Ayala is here c/oright foot pain on ball of foot after catching herself on the foot to prevent a fall on Oct 20, pain and swelling have worsened, and has increqsed difficulty weight bearing ROS Denies recent fever or chills. Denies sinus pressure, nasal congestion, ear pain or sore throat. Denies chest congestion, productive cough or wheezing. Denies chest pains, palpitations and leg swelling Denies abdominal pain, nausea, vomiting,diarrhea or constipation.   Denies dysuria, frequency, hesitancy or incontinence.  Denies headaches, seizures, numbness, or tingling. Denies depression, anxiety or insomnia. Denies skin break down or rash.   PE  BP 114/77   Pulse (!) 101   Resp 16   Ht 5' (1.524 m)   Wt 228 lb 6.4 oz (103.6 kg)   LMP 04/11/2012   SpO2 95%   BMI 44.61 kg/m   Patient alert and oriented and in no cardiopulmonary distress.In pain with weight bearing  HEENT: No facial asymmetry, EOMI,     Neck supple .  Chest: Clear to auscultation bilaterally.  CVS: S1, S2 no murmurs, no S3.Regular rate.  ABD: Soft non tender.   Ext: No edema  MS: Adequate ROM spine, shoulders, hips and knees. Tender over ball of right foot Skin: Intact, no ulcerations or rash noted.  Psych: Good eye contact, normal affect. Memory intact not anxious or depressed appearing.  CNS: CN 2-12 intact, power,  normal throughout.no focal deficits noted.   Assessment & Plan  Morbid obesity [facility])  Patient re-educated about  the importance of commitment to a  minimum of 150 minutes of exercise per week as able.  The importance of healthy food choices with portion control discussed, as well as eating regularly and within a 12 hour window most days. The need to choose "clean , green" food 50 to 75% of the time is discussed, as well as to make water the primary drink and set a goal of 64 ounces water daily.     Weight /BMI 05/21/2021 05/20/2021 04/17/2021  WEIGHT 224 lb 9.6 oz 228 lb 6.4 oz -  HEIGHT 5\' 0"  5\' 0"  5\' 0"   BMI 43.86 kg/m2 44.61 kg/m2 45.72 kg/m2      Tobacco abuse Asked:confirms currently smokes cigarettes Assess: Unwilling to set a quit date, but is cutting back Advise: needs to QUIT to reduce risk of cancer, cardio and cerebrovascular disease Assist: counseled for 5 minutes and literature provided Arrange: follow up in 2 to 4 months   Acute foot pain, right Urgent ortho appt scheduled, naproxen prescribed, plan to order x ray and walkin shoe stopped as Ortho apptarranged

## 2021-05-21 ENCOUNTER — Ambulatory Visit: Payer: Medicaid Other

## 2021-05-21 ENCOUNTER — Encounter: Payer: Self-pay | Admitting: Orthopedic Surgery

## 2021-05-21 ENCOUNTER — Ambulatory Visit (HOSPITAL_COMMUNITY)
Admission: RE | Admit: 2021-05-21 | Discharge: 2021-05-21 | Disposition: A | Discharge status: Home / Self Care | Payer: Medicaid Other | Source: Ambulatory Visit | Attending: Internal Medicine | Admitting: Internal Medicine

## 2021-05-21 ENCOUNTER — Ambulatory Visit: Payer: Medicaid Other | Admitting: Orthopedic Surgery

## 2021-05-21 VITALS — BP 130/81 | HR 102 | Ht 60.0 in | Wt 224.6 lb

## 2021-05-21 DIAGNOSIS — M79671 Pain in right foot: Secondary | ICD-10-CM

## 2021-05-21 DIAGNOSIS — S93691A Other sprain of right foot, initial encounter: Secondary | ICD-10-CM | POA: Diagnosis not present

## 2021-05-21 DIAGNOSIS — Z1231 Encounter for screening mammogram for malignant neoplasm of breast: Secondary | ICD-10-CM | POA: Diagnosis not present

## 2021-05-21 NOTE — Patient Instructions (Signed)
YOU ARE ALLOWED TO WEIGHT BEAR IN THIS CAM WALKER BUT KEEP THE WALKING TO A MINIMUM  YOU HAVE A SPRAIN OF THE PLANTAR ARCH

## 2021-05-21 NOTE — Progress Notes (Signed)
Chief Complaint  Patient presents with   Foot Pain    DOI 04/24/21 trying to catch herself from a fall Pain is located on inside of foot and ball of foot    HPI: 45 year old female who was falling landed on the ball of her right foot comes in complaining of 4 weeks worsening pain plantar aspect of the foot medial side.  No treatment to date has not had any x-rays  Past Medical History:  Diagnosis Date   Angular cheilitis 07/17/2020   Anxiety    BOOP (bronchiolitis obliterans with organizing pneumonia) ([place]) 07/09/2020   Cervical cancer ([place]) 04/2011   Stage 1B squamous cell   Chronic left shoulder pain 10/07/2017   Chronic tension-type headache, not intractable 10/07/2017   Class 2 obesity 09/29/2019   Cold sore 04/11/2020   Depression    Encounter for screening mammogram for malignant neoplasm of breast 09/29/2019   Encounter for support and coordination of transition of care 07/09/2020   Family history of diabetes mellitus 09/29/2019   Family history of hyperlipidemia 09/29/2019   Family history of hypothyroidism 09/29/2019   High cholesterol    History of cervical cancer 10/27/2019   History of kidney stones    passed   Prehypertension 06/26/2020   Respiratory failure with hypoxia ([place]) 06/30/2020   S/P lumbar fusion 04/20/2018   Screening for colorectal cancer 12/18/2019   Soreness of tongue 04/23/2020    BP 130/81   Pulse (!) 102   Ht 5' (1.524 m)   Wt 224 lb 9.6 oz (101.9 kg)   LMP 04/11/2012   BMI 43.86 kg/m   The patient meets the AMA guidelines for Morbid (severe) obesity with a BMI > 40.0 and I have recommended weight loss.  General appearance: Well-developed well-nourished no gross deformities  Cardiovascular normal pulse and perfusion normal color without edema  Neurologically no sensation loss or deficits or pathologic reflexes  Psychological: Awake alert and oriented x3 mood and affect normal  Skin no lacerations or ulcerations no nodularity no palpable masses, no  erythema or nodularity  Musculoskeletal: Right foot normal plantigrade foot Achilles tendon intact swelling medial side of the foot and ankle with some tenderness noted in the posterior tibial tendon and medial cord of the plantar fascia mild heel pain also noted.  Ankle range of motion is normal ankle stability is normal muscle tone and strength are normal    Imaging NO ACUTE FRACTURE 3 extraosseous bones to medial 1 lateral  A/P  Encounter Diagnoses  Name Primary?   Pain in right foot    Arch pain of right foot    Rupture of plantar fascia of right foot, initial encounter, SPRAIN, INCOMPLETE Yes    6 weeks CAM Walker try weightbearing as tolerated

## 2021-05-26 ENCOUNTER — Encounter: Payer: Self-pay | Admitting: Family Medicine

## 2021-05-26 DIAGNOSIS — M79671 Pain in right foot: Secondary | ICD-10-CM | POA: Insufficient documentation

## 2021-05-26 NOTE — Assessment & Plan Note (Signed)
Urgent ortho appt scheduled, naproxen prescribed, plan to order x ray and walkin shoe stopped as Ortho apptarranged

## 2021-05-26 NOTE — Assessment & Plan Note (Signed)
  Patient re-educated about  the importance of commitment to a  minimum of 150 minutes of exercise per week as able.  The importance of healthy food choices with portion control discussed, as well as eating regularly and within a 12 hour window most days. The need to choose "clean , green" food 50 to 75% of the time is discussed, as well as to make water the primary drink and set a goal of 64 ounces water daily.    Weight /BMI 05/21/2021 05/20/2021 04/17/2021  WEIGHT 224 lb 9.6 oz 228 lb 6.4 oz -  HEIGHT 5\' 0"  5\' 0"  5\' 0"   BMI 43.86 kg/m2 44.61 kg/m2 45.72 kg/m2

## 2021-05-26 NOTE — Assessment & Plan Note (Signed)
Asked:confirms currently smokes cigarettes °Assess: Unwilling to set a quit date, but is cutting back °Advise: needs to QUIT to reduce risk of cancer, cardio and cerebrovascular disease °Assist: counseled for 5 minutes and literature provided °Arrange: follow up in 2 to 4 months ° °

## 2021-06-01 DIAGNOSIS — J9691 Respiratory failure, unspecified with hypoxia: Secondary | ICD-10-CM | POA: Diagnosis not present

## 2021-06-01 DIAGNOSIS — J84116 Cryptogenic organizing pneumonia: Secondary | ICD-10-CM | POA: Diagnosis not present

## 2021-06-02 ENCOUNTER — Encounter: Payer: Self-pay | Admitting: Internal Medicine

## 2021-06-02 ENCOUNTER — Other Ambulatory Visit: Payer: Self-pay

## 2021-06-02 ENCOUNTER — Ambulatory Visit (INDEPENDENT_AMBULATORY_CARE_PROVIDER_SITE_OTHER): Payer: Medicaid Other | Admitting: Internal Medicine

## 2021-06-02 DIAGNOSIS — Z720 Tobacco use: Secondary | ICD-10-CM | POA: Diagnosis not present

## 2021-06-02 DIAGNOSIS — R0789 Other chest pain: Secondary | ICD-10-CM | POA: Diagnosis not present

## 2021-06-02 MED ORDER — VARENICLINE TARTRATE 1 MG PO TABS: 1.0000 mg | ORAL_TABLET | Freq: Two times a day (BID) | ORAL | 2 refills | Status: DC

## 2021-06-02 MED ORDER — VARENICLINE TARTRATE 0.5 MG PO TABS
ORAL_TABLET | ORAL | 0 refills | Status: AC
Start: 2021-06-02 — End: 2021-06-09

## 2021-06-02 NOTE — Progress Notes (Signed)
Virtual Visit via Telephone Note   This visit type was conducted due to national recommendations for restrictions regarding the COVID-19 Pandemic (e.g. social distancing) in an effort to limit this patient's exposure and mitigate transmission in our community.  Due to her co-morbid illnesses, this patient is at least at moderate risk for complications without adequate follow up.  This format is felt to be most appropriate for this patient at this time.  The patient did not have access to video technology/had technical difficulties with video requiring transitioning to audio format only (telephone).  All issues noted in this document were discussed and addressed.  No physical exam could be performed with this format.  Evaluation Performed:  Follow-up visit  Date:  06/02/2021   ID:  April Ayala, DOB [birth year], MRN [id number]  Patient Location: Home Provider Location: Office/Clinic  Participants: Patient Location of Patient: Home Location of Provider: Telehealth Consent was obtain for visit to be over via telehealth. I verified that I am speaking with the correct person using two identifiers.  PCP:  Lindell Spar, MD   Chief Complaint: Chest pain and smoking cessation  History of Present Illness:    April Ayala is a 45 y.o. female who has a televisit for complaint of chest pain, which was in the center of the chest and was sharp.  She went to ER on 10/13 after the chest pain episode, and had negative cardiac enzymes with EKG showing sinus tachycardia without any acute signs of ischemia.  She has not had any severe episode of chest pain.  She does report having mild chest pain with exertion and emotional stress.  She also reports mild dyspnea at times.  Of note, she continues to smoke 3 to 4 cigarettes/day.  She states that Wellbutrin has not been helping now for smoking cessation and wants to take Chantix as it has helped her in the past.  She does report that Wellbutrin  helps her with her anxiety.  The patient does not have symptoms concerning for COVID-19 infection (fever, chills, cough, or new shortness of breath).   Past Medical, Surgical, Social History, Allergies, and Medications have been Reviewed.  Past Medical History:  Diagnosis Date   Angular cheilitis 07/17/2020   Anxiety    BOOP (bronchiolitis obliterans with organizing pneumonia) ([facility]) 07/09/2020   Cervical cancer ([place]) 04/2011   Stage 1B squamous cell   Chronic left shoulder pain 10/07/2017   Chronic tension-type headache, not intractable 10/07/2017   Class 2 obesity 09/29/2019   Cold sore 04/11/2020   Depression    Encounter for screening mammogram for malignant neoplasm of breast 09/29/2019   Encounter for support and coordination of transition of care 07/09/2020   Family history of diabetes mellitus 09/29/2019   Family history of hyperlipidemia 09/29/2019   Family history of hypothyroidism 09/29/2019   High cholesterol    History of cervical cancer 10/27/2019   History of kidney stones    passed   Prehypertension 06/26/2020   Respiratory failure with hypoxia ([place]) 06/30/2020   S/P lumbar fusion 04/20/2018   Screening for colorectal cancer 12/18/2019   Soreness of tongue 04/23/2020   Past Surgical History:  Procedure Laterality Date   ABDOMINAL HYSTERECTOMY     BREAST BIOPSY Right    CERVICAL CONIZATION W/BX  03/24/2012   Procedure: CONIZATION CERVIX WITH BIOPSY;  Surgeon: Woodroe Mode, MD;  Location: [facility];  Service: Gynecology;  Laterality: N/A;   DIAGNOSTIC LAPAROSCOPY  2007  ectopic   ECTOPIC PREGNANCY SURGERY     ESSURE TUBAL LIGATION  2010   INSERTION OF SUPRAPUBIC CATHETER  05/03/2012   Procedure: INSERTION OF SUPRAPUBIC CATHETER;  Surgeon: Alvino Chapel, MD;  Location: [facility];  Service: Gynecology;;   LEEP     LUMBAR LAMINECTOMY/DECOMPRESSION MICRODISCECTOMY N/A 04/20/2018   Procedure: LUMBAR FIVE TO SACRAL ONE DECOMPRESSION AND FUSION, POSSIBLE LUMBAR FOUR TO  SACRAL ONE; SCREW AND CAGE INSTRUMENTATION;  Surgeon: Melina Schools, MD;  Location: [facility];  Service: Orthopedics;  Laterality: N/A;  5 hrs   LYMPHADENECTOMY  05/03/2012   Procedure: LYMPHADENECTOMY;  Surgeon: Alvino Chapel, MD;  Location: [facility];  Service: Gynecology;  Laterality: Bilateral;  pelvic   RADICAL HYSTERECTOMY  05/03/2012   Procedure: RADICAL HYSTERECTOMY;  Surgeon: Alvino Chapel, MD;  [facility]: [facility];  Service: Gynecology;;  transposition of the ovaries   SPINE SURGERY     TUBAL LIGATION       Current Meds  Medication Sig   albuterol (VENTOLIN HFA) 108 (90 Base) MCG/ACT inhaler Inhale 2 puffs into the lungs every 4 (four) hours as needed for wheezing or shortness of breath.   amitriptyline (ELAVIL) 75 MG tablet TAKE 1 TABLET BY MOUTH AT BEDTIME   buPROPion (WELLBUTRIN SR) 150 MG 12 hr tablet Take 1 tablet (150 mg total) by mouth 2 (two) times daily.   busPIRone (BUSPAR) 7.5 MG tablet Take 1 tablet (7.5 mg total) by mouth 2 (two) times daily.   cyclobenzaprine (FLEXERIL) 10 MG tablet Take 10 mg by mouth 3 (three) times daily.    fluticasone (FLONASE) 50 MCG/ACT nasal spray Place 2 sprays into both nostrils daily.   fluticasone furoate-vilanterol (BREO ELLIPTA) 200-25 MCG/INH AEPB Inhale 1 puff into the lungs daily.   Galcanezumab-gnlm (EMGALITY) 120 MG/ML SOAJ INJECT 120 MG INTO THE SKIN EVERY 30 DAYS   meclizine (ANTIVERT) 25 MG tablet Take 1 tablet (25 mg total) by mouth 3 (three) times daily as needed for dizziness.   naproxen (NAPROSYN) 500 MG tablet Take 1 tablet (500 mg total) by mouth 2 (two) times daily with a meal.   omeprazole (PRILOSEC) 20 MG capsule Take 1 capsule (20 mg total) by mouth daily.   pregabalin (LYRICA) 150 MG capsule Take 150 mg by mouth 2 (two) times daily.   rizatriptan (MAXALT-MLT) 10 MG disintegrating tablet Take 1 tablet (10 mg total) by mouth as needed for migraine. May repeat in 2 hours if needed   rosuvastatin (CRESTOR) 5 MG  tablet Take 1 tablet (5 mg total) by mouth daily.     Allergies:   Patient has no known allergies.   ROS:   Please see the history of present illness.     All other systems reviewed and are negative.   Labs/Other Tests and Data Reviewed:    Recent Labs: 06/29/2020: Magnesium 1.9 06/30/2020: B Natriuretic Peptide 120.0 09/10/2020: ALT 21; TSH 1.220 04/17/2021: BUN 8; Creatinine, Ser 0.89; Hemoglobin 16.1; Platelets 461; Potassium 3.4; Sodium 137   Recent Lipid Panel Lab Results  Component Value Date/Time   CHOL 135 09/10/2020 11:41 AM   TRIG 102 09/10/2020 11:41 AM   HDL 49 09/10/2020 11:41 AM   CHOLHDL 2.8 09/10/2020 11:41 AM   CHOLHDL 4.4 09/29/2019 11:05 AM   LDLCALC 67 09/10/2020 11:41 AM   LDLCALC 122 (H) 09/29/2019 11:05 AM    Wt Readings from Last 3 Encounters:  05/21/21 224 lb 9.6 oz (101.9 kg)  05/20/21 228 lb 6.4 oz (103.6 kg)  03/17/21 234 lb 1.9 oz (106.2 kg)     ASSESSMENT & PLAN:    Atypical chest pain ER chart reviewed, including blood tests and EKG EKG showed sinus tachycardia without any acute signs of ischemia No recent episode of chest pain Unclear etiology, but considering her smoking history and hyperlipidemia, will refer to cardiology for possible stress test  Tobacco abuse Smokes about 3 to 4 cigarettes/day Continue Wellbutrin for depression with anxiety Started Chantix   Time:   Today, I have spent 13 minutes reviewing the chart, including problem list, medications, and with the patient with telehealth technology discussing the above problems.   Medication Adjustments/Labs and Tests Ordered: Current medicines are reviewed at length with the patient today.  Concerns regarding medicines are outlined above.   Tests Ordered: No orders of the defined types were placed in this encounter.   Medication Changes: No orders of the defined types were placed in this encounter.    Note: This dictation was prepared with Dragon dictation along  with smaller phrase technology. Similar sounding words can be transcribed inadequately or may not be corrected upon review. Any transcriptional errors that result from this process are unintentional.      Disposition:  Follow up  Signed, Lindell Spar, MD  06/02/2021 3:35 PM     [facility]

## 2021-06-02 NOTE — Patient Instructions (Signed)
You are being referred to cardiology for evaluation of chest pain.  Please start taking Chantix as prescribed for smoking cessation.

## 2021-06-07 ENCOUNTER — Telehealth: Payer: Medicaid Other | Admitting: Emergency Medicine

## 2021-06-07 ENCOUNTER — Encounter: Payer: Self-pay | Admitting: Internal Medicine

## 2021-06-07 DIAGNOSIS — B001 Herpesviral vesicular dermatitis: Secondary | ICD-10-CM

## 2021-06-07 DIAGNOSIS — R300 Dysuria: Secondary | ICD-10-CM

## 2021-06-07 MED ORDER — VALACYCLOVIR HCL 1 G PO TABS: 2000.0000 mg | ORAL_TABLET | Freq: Two times a day (BID) | ORAL | 0 refills | Status: AC

## 2021-06-07 MED ORDER — NITROFURANTOIN MONOHYD MACRO 100 MG PO CAPS: 100.0000 mg | ORAL_CAPSULE | Freq: Two times a day (BID) | ORAL | 0 refills | Status: AC

## 2021-06-07 NOTE — Progress Notes (Signed)

## 2021-06-07 NOTE — Progress Notes (Signed)

## 2021-06-07 NOTE — Progress Notes (Signed)
I have spent 5 minutes in review of e-visit questionnaire, review and updating patient chart, medical decision making and response to patient.   Marshun Duva, PA-C    

## 2021-06-07 NOTE — Progress Notes (Signed)
I have spent 5 minutes in review of e-visit questionnaire, review and updating patient chart, medical decision making and response to patient.   Syrena Burges, PA-C    

## 2021-06-09 ENCOUNTER — Other Ambulatory Visit: Payer: Self-pay | Admitting: *Deleted

## 2021-06-09 DIAGNOSIS — E7841 Elevated Lipoprotein(a): Secondary | ICD-10-CM

## 2021-06-09 MED ORDER — ROSUVASTATIN CALCIUM 5 MG PO TABS: 5.0000 mg | ORAL_TABLET | Freq: Every day | ORAL | 1 refills | Status: DC

## 2021-06-13 ENCOUNTER — Other Ambulatory Visit: Payer: Self-pay | Admitting: Neurology

## 2021-06-13 ENCOUNTER — Encounter: Payer: Self-pay | Admitting: Internal Medicine

## 2021-06-13 ENCOUNTER — Other Ambulatory Visit: Payer: Self-pay | Admitting: Nurse Practitioner

## 2021-06-13 ENCOUNTER — Other Ambulatory Visit: Payer: Self-pay | Admitting: Internal Medicine

## 2021-06-13 DIAGNOSIS — G43809 Other migraine, not intractable, without status migrainosus: Secondary | ICD-10-CM

## 2021-06-13 DIAGNOSIS — F419 Anxiety disorder, unspecified: Secondary | ICD-10-CM

## 2021-06-13 DIAGNOSIS — G43709 Chronic migraine without aura, not intractable, without status migrainosus: Secondary | ICD-10-CM

## 2021-06-14 ENCOUNTER — Other Ambulatory Visit: Payer: Self-pay | Admitting: *Deleted

## 2021-06-14 MED ORDER — OMEPRAZOLE 20 MG PO CPDR: 20.0000 mg | DELAYED_RELEASE_CAPSULE | Freq: Every day | ORAL | 3 refills | Status: DC

## 2021-06-16 ENCOUNTER — Telehealth: Payer: Medicaid Other | Admitting: Family

## 2021-06-16 DIAGNOSIS — R509 Fever, unspecified: Secondary | ICD-10-CM

## 2021-06-16 DIAGNOSIS — H532 Diplopia: Secondary | ICD-10-CM

## 2021-06-16 MED ORDER — AMITRIPTYLINE HCL 75 MG PO TABS: 75.0000 mg | ORAL_TABLET | Freq: Every day | ORAL | 0 refills | Status: DC

## 2021-06-16 NOTE — Progress Notes (Signed)
Based on what you shared with me, I feel your condition warrants further evaluation and I recommend that you be seen in a face to face visit.  Given your symptoms of double vision and fever you need to be seen in person to be evaluated.    NOTE: There will be NO CHARGE for this eVisit   If you are having a true medical emergency please call 911.      For an urgent face to face visit, [facility] has six urgent care centers for your convenience:     [facility] [place] Get Driving Directions [phone] [place], [place] [address]    [facility]) Get Driving Directions [phone] [place], [place] [address]  [facility] [place] ([place]) Get Driving Directions [phone] [address] [place],  [place]  [address]  [facility] Get Driving Directions [phone] [place], [place], [place] [address]   [facility] Get Driving Directions  [phone] [address] [place], [place] [address]    [facility] at [facility] Get Driving Directions [phone] [address], [place], [place] [address]  Your MyChart E-visit questionnaire answers were reviewed by a board certified advanced clinical practitioner to complete your personal care plan based on your specific symptoms.  Thank you for using e-Visits.

## 2021-06-19 ENCOUNTER — Encounter: Payer: Self-pay | Admitting: Internal Medicine

## 2021-06-19 ENCOUNTER — Institutional Professional Consult (permissible substitution): Payer: Medicaid Other | Admitting: Plastic Surgery

## 2021-06-20 ENCOUNTER — Other Ambulatory Visit: Payer: Self-pay | Admitting: *Deleted

## 2021-06-20 DIAGNOSIS — Z720 Tobacco use: Secondary | ICD-10-CM

## 2021-06-20 DIAGNOSIS — F418 Other specified anxiety disorders: Secondary | ICD-10-CM

## 2021-06-20 MED ORDER — MECLIZINE HCL 25 MG PO TABS: 25.0000 mg | ORAL_TABLET | Freq: Three times a day (TID) | ORAL | 0 refills | Status: DC | PRN

## 2021-06-20 MED ORDER — BUPROPION HCL ER (SR) 150 MG PO TB12: 150.0000 mg | ORAL_TABLET | Freq: Two times a day (BID) | ORAL | 2 refills | Status: DC

## 2021-07-01 ENCOUNTER — Telehealth: Payer: Medicaid Other | Admitting: Emergency Medicine

## 2021-07-01 DIAGNOSIS — R0981 Nasal congestion: Secondary | ICD-10-CM | POA: Diagnosis not present

## 2021-07-01 DIAGNOSIS — J84116 Cryptogenic organizing pneumonia: Secondary | ICD-10-CM | POA: Diagnosis not present

## 2021-07-01 DIAGNOSIS — J9691 Respiratory failure, unspecified with hypoxia: Secondary | ICD-10-CM | POA: Diagnosis not present

## 2021-07-01 MED ORDER — IPRATROPIUM BROMIDE 0.03 % NA SOLN: 2.0000 | Freq: Two times a day (BID) | NASAL | 0 refills | Status: DC

## 2021-07-01 NOTE — Progress Notes (Incomplete)
CARDIOLOGY CONSULT NOTE       Patient ID: April Ayala MRN: [id number] DOB/AGE: 43-Jan-1977 43 y.o.  Admit date: (Not on file) Referring Physician: Posey Pronto Primary Physician: Lindell Spar, MD Primary Cardiologist: New Reason for Consultation: Chest pain  Active Problems:   * No active hospital problems. *   HPI:  45 y.o. referred by Dr Posey Pronto for chest pain Office visit 05/27/21 complained of chest pain in center of chest Sharp Seen in ER 04/17/21 R/O ECG with no acute changes Occurs with emotional stress Mild exertional dyspnea Actively smoking Welbutrin helps with anxiety but not smoking cessation has tried Chantix as well Recent bladder infection and herpes simplex oral mucosa History of HLD  On crestor LDL 67   ***  ROS All other systems reviewed and negative except as noted above  Past Medical History:  Diagnosis Date   Angular cheilitis 07/17/2020   Anxiety    BOOP (bronchiolitis obliterans with organizing pneumonia) ([facility]) 07/09/2020   Cervical cancer ([place]) 04/2011   Stage 1B squamous cell   Chronic left shoulder pain 10/07/2017   Chronic tension-type headache, not intractable 10/07/2017   Class 2 obesity 09/29/2019   Cold sore 04/11/2020   Depression    Encounter for screening mammogram for malignant neoplasm of breast 09/29/2019   Encounter for support and coordination of transition of care 07/09/2020   Family history of diabetes mellitus 09/29/2019   Family history of hyperlipidemia 09/29/2019   Family history of hypothyroidism 09/29/2019   High cholesterol    History of cervical cancer 10/27/2019   History of kidney stones    passed   Prehypertension 06/26/2020   Respiratory failure with hypoxia ([facility] [place]) 06/30/2020   S/P lumbar fusion 04/20/2018   Screening for colorectal cancer 12/18/2019   Soreness of tongue 04/23/2020    Family History  Problem Relation Age of Onset   Diabetes Mother    Hypertension Father    Diabetes Sister     Migraines Sister    Hypothyroidism Sister    Migraines Sister    Migraines Sister     Social History   Socioeconomic History   Marital status: Significant Other    Spouse name: Not on file   Number of children: 4   Years of education: Not on file   Highest education level: 12th grade  Occupational History   Occupation: Paediatric nurse  Tobacco Use   Smoking status: Every Day    Packs/day: 0.25    Years: 1.00    Pack years: 0.25    Types: Cigarettes    Start date: 11/03/2018   Smokeless tobacco: Never   Tobacco comments:    smoking cessation information given  Vaping Use   Vaping Use: Never used  Substance and Sexual Activity   Alcohol use: Never   Drug use: Never   Sexual activity: Yes    Birth control/protection: Surgical    Comment: hyst  Other Topics Concern   Not on file  Social History Narrative   Lives with fiance and 1 daughter      1 son is with grandmother    Oldest daughter is 67 lives with grandfather      Works as a Aeronautical engineer at [employer].     Lives in [place], [place].      6 dogs, a  beard dragon, a snake, and a hamster and a cat      Enjoys: taking care of her pets      Diet: avoids lunch due  to work, eats all food groups   Caffeine: monsters in the [employer]: limited      Wears seat belt    Does not use phone while driving    Smoke detectors at home       Right handed   Social Determinants of Health   Financial Resource Strain: Low Risk    Difficulty of Paying Living Expenses: Not hard at all  Food Insecurity: No Food Insecurity   Worried About Charity fundraiser in the Last Year: Never true   Arboriculturist in the Last Year: Never true  Transportation Needs: No Transportation Needs   Lack of Transportation (Medical): No   Lack of Transportation (Non-Medical): No  Physical Activity: Inactive   Days of Exercise per Week: 0 days   Minutes of Exercise per Session: 0 min  Stress: No Stress Concern Present    Feeling of Stress : Only a little  Social Connections: Socially Isolated   Frequency of Communication with Friends and Family: Once a week   Frequency of Social Gatherings with Friends and Family: Once a week   Attends Religious Services: 1 to 4 times per year   Active Member of [employer] or Organizations: No   Attends Archivist Meetings: Never   Marital Status: Divorced  Human resources officer Violence: Not At Risk   Fear of Current or Ex-Partner: No   Emotionally Abused: No   Physically Abused: No   Sexually Abused: No    Past Surgical History:  Procedure Laterality Date   ABDOMINAL HYSTERECTOMY     BREAST BIOPSY Right    CERVICAL CONIZATION W/BX  03/24/2012   Procedure: CONIZATION CERVIX WITH BIOPSY;  Surgeon: Woodroe Mode, MD;  Location: [facility];  Service: Gynecology;  Laterality: N/A;   DIAGNOSTIC LAPAROSCOPY  2007   ectopic   ECTOPIC PREGNANCY SURGERY     ESSURE TUBAL LIGATION  2010   INSERTION OF SUPRAPUBIC CATHETER  05/03/2012   Procedure: INSERTION OF SUPRAPUBIC CATHETER;  Surgeon: Alvino Chapel, MD;  [facility];  Service: Gynecology;;   LEEP     LUMBAR LAMINECTOMY/DECOMPRESSION MICRODISCECTOMY N/A 04/20/2018   Procedure: LUMBAR FIVE TO SACRAL ONE DECOMPRESSION AND FUSION, POSSIBLE LUMBAR FOUR TO SACRAL ONE; SCREW AND CAGE INSTRUMENTATION;  Surgeon: Melina Schools, MD;  Location: [facility];  Service: Orthopedics;  Laterality: N/A;  5 hrs   LYMPHADENECTOMY  05/03/2012   Procedure: LYMPHADENECTOMY;  Surgeon: Alvino Chapel, MD;  [facility];  Service: Gynecology;  Laterality: Bilateral;  pelvic   RADICAL HYSTERECTOMY  05/03/2012   Procedure: RADICAL HYSTERECTOMY;  Surgeon: Alvino Chapel, MD;  [facility];  Service: Gynecology;;  transposition of the ovaries   SPINE SURGERY     TUBAL LIGATION        Current Outpatient Medications:    albuterol (VENTOLIN HFA) 108 (90 Base) MCG/ACT inhaler, Inhale 2 puffs  into the lungs every 4 (four) hours as needed for wheezing or shortness of breath., Disp: 18 g, Rfl: 2   amitriptyline (ELAVIL) 75 MG tablet, Take 1 tablet (75 mg total) by mouth at bedtime., Disp: 30 tablet, Rfl: 0   buPROPion (WELLBUTRIN SR) 150 MG 12 hr tablet, Take 1 tablet (150 mg total) by mouth 2 (two) times daily., Disp: 60 tablet, Rfl: 2   busPIRone (BUSPAR) 7.5 MG tablet, Take 1 tablet (7.5 mg total) by mouth 2 (two) times daily., Disp: 60 tablet, Rfl: 5   cyclobenzaprine (FLEXERIL) 10  MG tablet, Take 10 mg by mouth 3 (three) times daily. , Disp: , Rfl:    fluticasone (FLONASE) 50 MCG/ACT nasal spray, Place 2 sprays into both nostrils daily., Disp: 16 g, Rfl: 2   fluticasone furoate-vilanterol (BREO ELLIPTA) 200-25 MCG/INH AEPB, Inhale 1 puff into the lungs daily., Disp: 30 each, Rfl: 2   Galcanezumab-gnlm (EMGALITY) 120 MG/ML SOAJ, INJECT 120 MG SUBCUTANEOUSLY EVERY 30 DAYS, Disp: 1 mL, Rfl: 11   meclizine (ANTIVERT) 25 MG tablet, Take 1 tablet (25 mg total) by mouth 3 (three) times daily as needed for dizziness., Disp: 21 tablet, Rfl: 0   naproxen (NAPROSYN) 500 MG tablet, Take 1 tablet (500 mg total) by mouth 2 (two) times daily with a meal., Disp: 10 tablet, Rfl: 0   omeprazole (PRILOSEC) 20 MG capsule, Take 1 capsule (20 mg total) by mouth daily., Disp: 30 capsule, Rfl: 3   pregabalin (LYRICA) 150 MG capsule, Take 150 mg by mouth 2 (two) times daily., Disp: , Rfl:    rizatriptan (MAXALT-MLT) 10 MG disintegrating tablet, Take 1 tablet (10 mg total) by mouth as needed for migraine. May repeat in 2 hours if needed, Disp: 9 tablet, Rfl: 5   rosuvastatin (CRESTOR) 5 MG tablet, Take 1 tablet (5 mg total) by mouth daily., Disp: 30 tablet, Rfl: 1   varenicline (CHANTIX CONTINUING MONTH PAK) 1 MG tablet, Take 1 tablet (1 mg total) by mouth 2 (two) times daily. From second week., Disp: 60 tablet, Rfl: 2    Physical Exam: Last menstrual period 04/11/2012.   Affect  appropriate Healthy:  appears stated age 55: normal Neck supple with no adenopathy JVP normal no bruits no thyromegaly Lungs clear with no wheezing and good diaphragmatic motion Heart:  S1/S2 no murmur, no rub, gallop or click PMI normal Abdomen: benighn, BS positve, no tenderness, no AAA no bruit.  No HSM or HJR Distal pulses intact with no bruits No edema Neuro non-focal Skin warm and dry No muscular weakness  Labs:   Lab Results  Component Value Date   WBC 9.6 04/17/2021   HGB 16.1 (H) 04/17/2021   HCT 47.3 (H) 04/17/2021   MCV 91.0 04/17/2021   PLT 461 (H) 04/17/2021   No results for input(s): NA, K, CL, CO2, BUN, CREATININE, CALCIUM, PROT, BILITOT, ALKPHOS, ALT, AST, GLUCOSE in the last 168 hours.  Invalid input(s): LABALBU Lab Results  Component Value Date   CKTOTAL 53 03/25/2011   CKMB 1.7 03/25/2011   TROPONINI <0.03 09/01/2015    Lab Results  Component Value Date   CHOL 135 09/10/2020   CHOL 189 09/29/2019   CHOL 174 07/20/2017   Lab Results  Component Value Date   HDL 49 09/10/2020   HDL 43 (L) 09/29/2019   HDL 52 07/20/2017   Lab Results  Component Value Date   LDLCALC 67 09/10/2020   LDLCALC 122 (H) 09/29/2019   LDLCALC 102 (H) 07/20/2017   Lab Results  Component Value Date   TRIG 102 09/10/2020   TRIG 126 09/29/2019   TRIG 106 07/20/2017   Lab Results  Component Value Date   CHOLHDL 2.8 09/10/2020   CHOLHDL 4.4 09/29/2019   CHOLHDL 3.3 07/20/2017   No results found for: LDLDIRECT    Radiology: No results found.  EKG: ST rate 120 bpm normal ST segments    ASSESSMENT AND PLAN:   Chest pain:  atypical R/O normal ECG calcium score and ETT recommended  HLD:  continue crestor Anxiety:  likely contributing factor on  Elavil and welbutrin  Smoking: counseled on cessation <10 minutes Chantix CXR 04/17/21 NAD   Calcium score ETT  F/U PRN   Signed: Jenkins Rouge 07/01/2021, 8:09 AM

## 2021-07-01 NOTE — Progress Notes (Signed)
E-Visit for Sinus Problems  We are sorry that you are not feeling well.  Here is how we plan to help!  Based on what you have shared with me it looks like you have sinusitis.  Sinusitis is inflammation and infection in the sinus cavities of the head.  Based on your presentation I believe you most likely have Acute Viral Sinusitis.This is an infection most likely caused by a virus. There is not specific treatment for viral sinusitis other than to help you with the symptoms until the infection runs its course.  You may use an oral decongestant such as Mucinex D or if you have glaucoma or high blood pressure use plain Mucinex. Saline nasal spray help and can safely be used as often as needed for congestion, I have prescribed: Ipratropium Bromide nasal spray 0.03% 2 sprays in eah nostril 2-3 times a day  Some authorities believe that zinc sprays or the use of Echinacea may shorten the course of your symptoms.  Sinus infections are not as easily transmitted as other respiratory infection, however we still recommend that you avoid close contact with loved ones, especially the very young and elderly.  Remember to wash your hands thoroughly throughout the day as this is the number one way to prevent the spread of infection!  Providers prescribe antibiotics to treat infections caused by bacteria. Antibiotics are very powerful in treating bacterial infections when they are used properly. To maintain their effectiveness, they should be used only when necessary. Overuse of antibiotics has resulted in the development of superbugs that are resistant to treatment!    After careful review of your answers, I would not recommend an antibiotic for your condition.  Antibiotics are not effective against viruses and therefore should not be used to treat them. Common examples of infections caused by viruses include colds and flu   Home Care: Only take medications as instructed by your medical team. Do not take these  medications with alcohol. A steam or ultrasonic humidifier can help congestion.  You can place a towel over your head and breathe in the steam from hot water coming from a faucet. Avoid close contacts especially the very young and the elderly. Cover your mouth when you cough or sneeze. Always remember to wash your hands.  Get Help Right Away If: You develop worsening fever or sinus pain. You develop a severe head ache or visual changes. Your symptoms persist after you have completed your treatment plan.  Make sure you Understand these instructions. Will watch your condition. Will get help right away if you are not doing well or get worse.   Thank you for choosing an e-visit.  Your e-visit answers were reviewed by a board certified advanced clinical practitioner to complete your personal care plan. Depending upon the condition, your plan could have included both over the counter or prescription medications.  Please review your pharmacy choice. Make sure the pharmacy is open so you can pick up prescription now. If there is a problem, you may contact your provider through MyChart messaging and have the prescription routed to another pharmacy.  Your safety is important to us. If you have drug allergies check your prescription carefully.   For the next 24 hours you can use MyChart to ask questions about today's visit, request a non-urgent call back, or ask for a work or school excuse. You will get an email in the next two days asking about your experience. I hope that your e-visit has been valuable and will   speed your recovery.   Approximately 5 minutes was used in reviewing the patient's chart, questionnaire, prescribing medications, and documentation.  

## 2021-07-02 ENCOUNTER — Encounter: Payer: Self-pay | Admitting: Orthopedic Surgery

## 2021-07-02 ENCOUNTER — Ambulatory Visit: Payer: Medicaid Other | Admitting: Orthopedic Surgery

## 2021-07-04 ENCOUNTER — Ambulatory Visit: Payer: Medicaid Other | Admitting: Cardiovascular Disease

## 2021-07-08 DIAGNOSIS — M5416 Radiculopathy, lumbar region: Secondary | ICD-10-CM | POA: Diagnosis not present

## 2021-07-15 ENCOUNTER — Encounter: Payer: Self-pay | Admitting: Internal Medicine

## 2021-07-15 ENCOUNTER — Other Ambulatory Visit: Payer: Self-pay | Admitting: *Deleted

## 2021-07-15 DIAGNOSIS — E7841 Elevated Lipoprotein(a): Secondary | ICD-10-CM

## 2021-07-15 MED ORDER — ROSUVASTATIN CALCIUM 5 MG PO TABS: 5.0000 mg | ORAL_TABLET | Freq: Every day | ORAL | 1 refills | Status: DC

## 2021-07-17 ENCOUNTER — Other Ambulatory Visit: Payer: Self-pay | Admitting: Internal Medicine

## 2021-07-17 DIAGNOSIS — G43809 Other migraine, not intractable, without status migrainosus: Secondary | ICD-10-CM

## 2021-07-17 DIAGNOSIS — F419 Anxiety disorder, unspecified: Secondary | ICD-10-CM

## 2021-07-23 ENCOUNTER — Institutional Professional Consult (permissible substitution): Payer: Medicaid Other | Admitting: Plastic Surgery

## 2021-07-24 ENCOUNTER — Ambulatory Visit: Payer: Medicaid Other | Admitting: Internal Medicine

## 2021-07-26 ENCOUNTER — Telehealth: Payer: Medicaid Other | Admitting: Family

## 2021-07-26 DIAGNOSIS — J069 Acute upper respiratory infection, unspecified: Secondary | ICD-10-CM | POA: Diagnosis not present

## 2021-07-27 MED ORDER — FLUTICASONE PROPIONATE 50 MCG/ACT NA SUSP
2.0000 | Freq: Every day | NASAL | 6 refills | Status: DC
Start: 2021-07-27 — End: 2022-08-12

## 2021-07-27 NOTE — Progress Notes (Signed)
E-Visit for Upper Respiratory Infection   We are sorry you are not feeling well.  Here is how we plan to help!  Based on what you have shared with me, it looks like you may have a viral upper respiratory infection.  Upper respiratory infections are caused by a large number of viruses; however, rhinovirus is the most common cause.   Symptoms vary from person to person, with common symptoms including sore throat, cough, fatigue or lack of energy and feeling of general discomfort.  A low-grade fever of up to 100.4 may present, but is often uncommon.  Symptoms vary however, and are closely related to a person's age or underlying illnesses.  The most common symptoms associated with an upper respiratory infection are nasal discharge or congestion, cough, sneezing, headache and pressure in the ears and face.  These symptoms usually persist for about 3 to 10 days, but can last up to 2 weeks.  It is important to know that upper respiratory infections do not cause serious illness or complications in most cases.    Upper respiratory infections can be transmitted from person to person, with the most common method of transmission being a person's hands.  The virus is able to live on the skin and can infect other persons for up to 2 hours after direct contact.  Also, these can be transmitted when someone coughs or sneezes; thus, it is important to cover the mouth to reduce this risk.  To keep the spread of the illness at [place], good hand hygiene is very important.  This is an infection that is most likely caused by a virus. There are no specific treatments other than to help you with the symptoms until the infection runs its course.  We are sorry you are not feeling well.  Here is how we plan to help!   For nasal congestion, you may use an oral decongestants such as Mucinex D or if you have glaucoma or high blood pressure use plain Mucinex.  Saline nasal spray or nasal drops can help and can safely be used as often as  needed for congestion.  For your congestion, I have prescribed Fluticasone nasal spray one spray in each nostril twice a day  If you do not have a history of heart disease, hypertension, diabetes or thyroid disease, prostate/bladder issues or glaucoma, you may also use Sudafed to treat nasal congestion.  It is highly recommended that you consult with a pharmacist or your primary care physician to ensure this medication is safe for you to take.     If you have a cough, you may use cough suppressants such as Delsym and Robitussin.  If you have glaucoma or high blood pressure, you can also use Coricidin HBP.     If you have a sore or scratchy throat, use a saltwater gargle-  to  teaspoon of salt dissolved in a 4-ounce to 8-ounce glass of warm water.  Gargle the solution for approximately 15-30 seconds and then spit.  It is important not to swallow the solution.  You can also use throat lozenges/cough drops and Chloraseptic spray to help with throat pain or discomfort.  Warm or cold liquids can also be helpful in relieving throat pain.  For headache, pain or general discomfort, you can use Ibuprofen or Tylenol as directed.   Some authorities believe that zinc sprays or the use of Echinacea may shorten the course of your symptoms.   HOME CARE Only take medications as instructed by your medical  Be sure to drink plenty of fluids. Water is fine as well as fruit juices, sodas and electrolyte beverages. You may want to stay away from caffeine or alcohol. If you are nauseated, try taking small sips of liquids. How do you know if you are getting enough fluid? Your urine should be a pale yellow or almost colorless. Get rest. Taking a steamy shower or using a humidifier may help nasal congestion and ease sore throat pain. You can place a towel over your head and breathe in the steam from hot water coming from a faucet. Using a saline nasal spray works much the same way. Cough drops, hard candies and sore  throat lozenges may ease your cough. Avoid close contacts especially the very young and the elderly Cover your mouth if you cough or sneeze Always remember to wash your hands.   GET HELP RIGHT AWAY IF: You develop worsening fever. If your symptoms do not improve within 10 days You develop yellow or green discharge from your nose over 3 days. You have coughing fits You develop a severe head ache or visual changes. You develop shortness of breath, difficulty breathing or start having chest pain Your symptoms persist after you have completed your treatment plan  MAKE SURE YOU  Understand these instructions. Will watch your condition. Will get help right away if you are not doing well or get worse.  Thank you for choosing an e-visit.  Your e-visit answers were reviewed by a board certified advanced clinical practitioner to complete your personal care plan. Depending upon the condition, your plan could have included both over the counter or prescription medications.  Please review your pharmacy choice. Make sure the pharmacy is open so you can pick up prescription now. If there is a problem, you may contact your provider through MyChart messaging and have the prescription routed to another pharmacy.  Your safety is important to us. If you have drug allergies check your prescription carefully.   For the next 24 hours you can use MyChart to ask questions about today's visit, request a non-urgent call back, or ask for a work or school excuse. You will get an email in the next two days asking about your experience. I hope that your e-visit has been valuable and will speed your recovery.   Approximately 5 minutes was spent documenting and reviewing patient's chart.    

## 2021-07-29 ENCOUNTER — Institutional Professional Consult (permissible substitution): Payer: Medicaid Other | Admitting: Plastic Surgery

## 2021-08-01 DIAGNOSIS — J84116 Cryptogenic organizing pneumonia: Secondary | ICD-10-CM | POA: Diagnosis not present

## 2021-08-01 DIAGNOSIS — J9691 Respiratory failure, unspecified with hypoxia: Secondary | ICD-10-CM | POA: Diagnosis not present

## 2021-08-03 ENCOUNTER — Emergency Department (HOSPITAL_COMMUNITY)
Admission: EM | Admit: 2021-08-03 | Discharge: 2021-08-03 | Disposition: A | Discharge status: Home / Self Care | Payer: Medicaid Other | Attending: Emergency Medicine | Admitting: Emergency Medicine

## 2021-08-03 ENCOUNTER — Emergency Department (HOSPITAL_COMMUNITY): Payer: Medicaid Other

## 2021-08-03 ENCOUNTER — Other Ambulatory Visit: Payer: Self-pay

## 2021-08-03 ENCOUNTER — Encounter (HOSPITAL_COMMUNITY): Payer: Self-pay | Admitting: Emergency Medicine

## 2021-08-03 DIAGNOSIS — S060X9A Concussion with loss of consciousness of unspecified duration, initial encounter: Secondary | ICD-10-CM | POA: Insufficient documentation

## 2021-08-03 DIAGNOSIS — Z20822 Contact with and (suspected) exposure to covid-19: Secondary | ICD-10-CM | POA: Diagnosis not present

## 2021-08-03 DIAGNOSIS — Z720 Tobacco use: Secondary | ICD-10-CM | POA: Diagnosis present

## 2021-08-03 DIAGNOSIS — K8020 Calculus of gallbladder without cholecystitis without obstruction: Secondary | ICD-10-CM | POA: Diagnosis not present

## 2021-08-03 DIAGNOSIS — Y92410 Unspecified street and highway as the place of occurrence of the external cause: Secondary | ICD-10-CM | POA: Diagnosis not present

## 2021-08-03 DIAGNOSIS — R000 Tachycardia, unspecified: Secondary | ICD-10-CM | POA: Insufficient documentation

## 2021-08-03 DIAGNOSIS — Z79899 Other long term (current) drug therapy: Secondary | ICD-10-CM | POA: Insufficient documentation

## 2021-08-03 DIAGNOSIS — M4317 Spondylolisthesis, lumbosacral region: Secondary | ICD-10-CM | POA: Diagnosis not present

## 2021-08-03 DIAGNOSIS — M545 Low back pain, unspecified: Secondary | ICD-10-CM

## 2021-08-03 DIAGNOSIS — S0990XA Unspecified injury of head, initial encounter: Secondary | ICD-10-CM | POA: Diagnosis not present

## 2021-08-03 DIAGNOSIS — Z7951 Long term (current) use of inhaled steroids: Secondary | ICD-10-CM | POA: Diagnosis not present

## 2021-08-03 DIAGNOSIS — S3992XA Unspecified injury of lower back, initial encounter: Secondary | ICD-10-CM | POA: Diagnosis present

## 2021-08-03 DIAGNOSIS — R55 Syncope and collapse: Secondary | ICD-10-CM

## 2021-08-03 DIAGNOSIS — S22080A Wedge compression fracture of T11-T12 vertebra, initial encounter for closed fracture: Secondary | ICD-10-CM | POA: Diagnosis not present

## 2021-08-03 DIAGNOSIS — G894 Chronic pain syndrome: Secondary | ICD-10-CM | POA: Diagnosis present

## 2021-08-03 DIAGNOSIS — S22089A Unspecified fracture of T11-T12 vertebra, initial encounter for closed fracture: Secondary | ICD-10-CM | POA: Diagnosis not present

## 2021-08-03 DIAGNOSIS — Z041 Encounter for examination and observation following transport accident: Secondary | ICD-10-CM | POA: Diagnosis not present

## 2021-08-03 LAB — URINALYSIS, ROUTINE W REFLEX MICROSCOPIC
Bilirubin Urine: NEGATIVE
Glucose, UA: NEGATIVE mg/dL
Hgb urine dipstick: NEGATIVE
Ketones, ur: NEGATIVE mg/dL
Nitrite: NEGATIVE
Protein, ur: NEGATIVE mg/dL
Specific Gravity, Urine: 1.002 — ABNORMAL LOW (ref 1.005–1.030)
pH: 7 (ref 5.0–8.0)

## 2021-08-03 LAB — CBC WITH DIFFERENTIAL/PLATELET
Abs Immature Granulocytes: 0.11 10*3/uL — ABNORMAL HIGH (ref 0.00–0.07)
Basophils Absolute: 0.1 10*3/uL (ref 0.0–0.1)
Basophils Relative: 1 %
Eosinophils Absolute: 0.2 10*3/uL (ref 0.0–0.5)
Eosinophils Relative: 2 %
HCT: 43.7 % (ref 36.0–46.0)
Hemoglobin: 15 g/dL (ref 12.0–15.0)
Immature Granulocytes: 1 %
Lymphocytes Relative: 20 %
Lymphs Abs: 2.7 10*3/uL (ref 0.7–4.0)
MCH: 30.9 pg (ref 26.0–34.0)
MCHC: 34.3 g/dL (ref 30.0–36.0)
MCV: 90.1 fL (ref 80.0–100.0)
Monocytes Absolute: 0.8 10*3/uL (ref 0.1–1.0)
Monocytes Relative: 6 %
Neutro Abs: 9.4 10*3/uL — ABNORMAL HIGH (ref 1.7–7.7)
Neutrophils Relative %: 70 %
Platelets: 343 10*3/uL (ref 150–400)
RBC: 4.85 MIL/uL (ref 3.87–5.11)
RDW: 13.9 % (ref 11.5–15.5)
WBC: 13.3 10*3/uL — ABNORMAL HIGH (ref 4.0–10.5)
nRBC: 0 % (ref 0.0–0.2)

## 2021-08-03 LAB — COMPREHENSIVE METABOLIC PANEL
ALT: 26 U/L (ref 0–44)
AST: 32 U/L (ref 15–41)
Albumin: 3.7 g/dL (ref 3.5–5.0)
Alkaline Phosphatase: 77 U/L (ref 38–126)
Anion gap: 10 (ref 5–15)
BUN: 5 mg/dL — ABNORMAL LOW (ref 6–20)
CO2: 26 mmol/L (ref 22–32)
Calcium: 9.1 mg/dL (ref 8.9–10.3)
Chloride: 101 mmol/L (ref 98–111)
Creatinine, Ser: 1.06 mg/dL — ABNORMAL HIGH (ref 0.44–1.00)
GFR, Estimated: >60 mL/min (ref >60)
Glucose, Bld: 106 mg/dL — ABNORMAL HIGH (ref 70–99)
Potassium: 3.7 mmol/L (ref 3.5–5.1)
Sodium: 137 mmol/L (ref 135–145)
Total Bilirubin: 0.5 mg/dL (ref 0.3–1.2)
Total Protein: 6.4 g/dL — ABNORMAL LOW (ref 6.5–8.1)

## 2021-08-03 LAB — RESP PANEL BY RT-PCR (FLU A&B, COVID) ARPGX2
Influenza A by PCR: NEGATIVE
Influenza B by PCR: NEGATIVE
SARS Coronavirus 2 by RT PCR: NEGATIVE

## 2021-08-03 LAB — RAPID URINE DRUG SCREEN, HOSP PERFORMED
Amphetamines: NOT DETECTED
Barbiturates: NOT DETECTED
Benzodiazepines: NOT DETECTED
Cocaine: NOT DETECTED
Opiates: NOT DETECTED
Tetrahydrocannabinol: NOT DETECTED

## 2021-08-03 LAB — TROPONIN I (HIGH SENSITIVITY)
Troponin I (High Sensitivity): 6 ng/L (ref <18)
Troponin I (High Sensitivity): 5 ng/L (ref <18)

## 2021-08-03 LAB — I-STAT BETA HCG BLOOD, ED (MC, WL, AP ONLY): I-stat hCG, quantitative: <5 m[IU]/mL (ref <5)

## 2021-08-03 LAB — ETHANOL: Alcohol, Ethyl (B): <10 mg/dL (ref <10)

## 2021-08-03 MED ORDER — LIDOCAINE 5 % EX PTCH: 1.0000 | MEDICATED_PATCH | CUTANEOUS | 0 refills | Status: DC

## 2021-08-03 MED ORDER — FENTANYL CITRATE PF 50 MCG/ML IJ SOSY
50.0000 ug | PREFILLED_SYRINGE | Freq: Once | INTRAMUSCULAR | Status: AC
  Administered 2021-08-03: 50 ug via INTRAVENOUS
  Filled 2021-08-03: qty 1

## 2021-08-03 MED ORDER — IOHEXOL 350 MG/ML SOLN
100.0000 mL | Freq: Once | INTRAVENOUS | Status: AC | PRN
  Administered 2021-08-03: 100 mL via INTRAVENOUS

## 2021-08-03 MED ORDER — LIDOCAINE 5 % EX PTCH
1.0000 | MEDICATED_PATCH | CUTANEOUS | Status: DC
  Administered 2021-08-03: 1 via TRANSDERMAL
  Filled 2021-08-03: qty 1

## 2021-08-03 MED ORDER — OXYCODONE-ACETAMINOPHEN 5-325 MG PO TABS
1.0000 | ORAL_TABLET | Freq: Once | ORAL | Status: AC
  Administered 2021-08-03: 1 via ORAL
  Filled 2021-08-03: qty 1

## 2021-08-03 MED ORDER — ONDANSETRON HCL 4 MG/2ML IJ SOLN
4.0000 mg | Freq: Once | INTRAMUSCULAR | Status: AC
Start: 2021-08-03 — End: 2021-08-03
  Administered 2021-08-03: 4 mg via INTRAVENOUS
  Filled 2021-08-03: qty 2
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
## 2021-08-03 NOTE — ED Notes (Signed)
Assisted pt with putting clothes on.

## 2021-08-03 NOTE — Assessment & Plan Note (Addendum)
-  Cessation encouraged -Continue wellbutrin
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
## 2021-08-03 NOTE — ED Notes (Signed)
Pt stated she is in a lot of pain and unable to get comfortable. Pt is crying. RN notified EDP

## 2021-08-03 NOTE — ED Notes (Signed)
RN notified ortho tech to place TLSO brace

## 2021-08-03 NOTE — ED Notes (Signed)
Provided pt warm blanket 

## 2021-08-03 NOTE — Discharge Instructions (Addendum)
Follow up with your orthopedic doctor and outpatient specialist as planned

## 2021-08-03 NOTE — ED Notes (Signed)
Ortho tech made aware of the need for TLSO brace.

## 2021-08-03 NOTE — ED Provider Notes (Signed)
Pt was seen by Dr Lorin Mercy.  Does not feel that admission is necessary.  TLSO brace has been ordered.  Dc after the brace.   Dorie Rank, MD 08/03/21 [phone]

## 2021-08-03 NOTE — ED Notes (Signed)
Applied warm blanket to pt back

## 2021-08-03 NOTE — Assessment & Plan Note (Signed)
-  Body mass index is 41.81 kg/m..  -Weight loss should be encouraged -Outpatient PCP/bariatric medicine/bariatric surgery f/u encouraged
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
## 2021-08-03 NOTE — Progress Notes (Signed)
Orthopedic Tech Progress Note Patient Details:  April Ayala 1975/08/18 [id number]  Ortho Devices Type of Ortho Device: Other (comment) Ortho Device/Splint Location: TLSO Brace / Back Ortho Device/Splint Interventions: Ordered  Dropped brace off to patient room.    Berenice Primas Antwion Carpenter 08/03/2021, 8:18 AM

## 2021-08-03 NOTE — ED Notes (Signed)
Ortho Tech notified RN that a stat order for a larger TLSO brace has been ordered.

## 2021-08-03 NOTE — ED Notes (Signed)
Ortho Tech at the bedside
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
## 2021-08-05 ENCOUNTER — Telehealth: Payer: Self-pay

## 2021-08-05 DIAGNOSIS — M4854XA Collapsed vertebra, not elsewhere classified, thoracic region, initial encounter for fracture: Secondary | ICD-10-CM | POA: Diagnosis not present

## 2021-08-05 NOTE — Telephone Encounter (Signed)
Transition Care Management Unsuccessful Follow-up Telephone Call  Date of discharge and from where:  08/03/2021-[place]   Attempts:  1st Attempt  Reason for unsuccessful TCM follow-up call:  Left voice message

## 2021-08-06 NOTE — Telephone Encounter (Signed)
Transition Care Management Unsuccessful Follow-up Telephone Call  Date of discharge and from where:  08/03/2021-[place]   Attempts:  2nd Attempt  Reason for unsuccessful TCM follow-up call:  Left voice message

## 2021-08-06 NOTE — Telephone Encounter (Signed)
Transition Care Management Unsuccessful Follow-up Telephone Call  Date of discharge and from where:  08/03/2021-[place]   Attempts:  3rd Attempt  Reason for unsuccessful TCM follow-up call:  Left voice message

## 2021-08-07 ENCOUNTER — Ambulatory Visit: Payer: Medicaid Other | Admitting: Internal Medicine

## 2021-08-07 ENCOUNTER — Institutional Professional Consult (permissible substitution): Payer: Medicaid Other | Admitting: Plastic Surgery

## 2021-08-08 ENCOUNTER — Ambulatory Visit: Payer: Self-pay | Admitting: Orthopedic Surgery

## 2021-08-08 DIAGNOSIS — Z01818 Encounter for other preprocedural examination: Secondary | ICD-10-CM

## 2021-08-11 ENCOUNTER — Ambulatory Visit (INDEPENDENT_AMBULATORY_CARE_PROVIDER_SITE_OTHER): Payer: Medicaid Other | Admitting: Internal Medicine

## 2021-08-11 ENCOUNTER — Other Ambulatory Visit: Payer: Self-pay | Admitting: *Deleted

## 2021-08-11 ENCOUNTER — Encounter: Payer: Self-pay | Admitting: Internal Medicine

## 2021-08-11 ENCOUNTER — Other Ambulatory Visit: Payer: Self-pay

## 2021-08-11 VITALS — BP 118/84 | HR 96 | Resp 18 | Ht 63.0 in | Wt 233.0 lb

## 2021-08-11 DIAGNOSIS — S22080D Wedge compression fracture of T11-T12 vertebra, subsequent encounter for fracture with routine healing: Secondary | ICD-10-CM | POA: Diagnosis not present

## 2021-08-11 DIAGNOSIS — F418 Other specified anxiety disorders: Secondary | ICD-10-CM

## 2021-08-11 DIAGNOSIS — Z01818 Encounter for other preprocedural examination: Secondary | ICD-10-CM | POA: Diagnosis not present

## 2021-08-11 DIAGNOSIS — Z09 Encounter for follow-up examination after completed treatment for conditions other than malignant neoplasm: Secondary | ICD-10-CM | POA: Diagnosis not present

## 2021-08-11 MED ORDER — OMEPRAZOLE 20 MG PO CPDR: 20.0000 mg | DELAYED_RELEASE_CAPSULE | Freq: Every day | ORAL | 3 refills | Status: DC

## 2021-08-11 NOTE — Patient Instructions (Signed)
Please apply heating pad around thumb area.  Please continue current medications as prescribed.  Please contact to be seen after you surgery.

## 2021-08-14 ENCOUNTER — Telehealth: Payer: Self-pay

## 2021-08-14 NOTE — Telephone Encounter (Signed)
[employer] medical forms  Copied Noted Sleeved

## 2021-08-15 ENCOUNTER — Ambulatory Visit: Payer: Medicaid Other | Admitting: Cardiology

## 2021-08-15 DIAGNOSIS — Z01818 Encounter for other preprocedural examination: Secondary | ICD-10-CM | POA: Insufficient documentation

## 2021-08-15 DIAGNOSIS — Z09 Encounter for follow-up examination after completed treatment for conditions other than malignant neoplasm: Secondary | ICD-10-CM | POA: Insufficient documentation

## 2021-08-15 NOTE — Assessment & Plan Note (Signed)
On BuSpar and Wellbutrin currently On amitriptyline for insomnia, will decrease dose after surgery as she also takes chronic opioids
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
## 2021-08-15 NOTE — Assessment & Plan Note (Signed)
BP wnl EKG from ER visit showed sinus rhythm. No signs of active ischemia.  Medically optimized with moderate risk for T12 kyphoplasty
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
## 2021-08-15 NOTE — Progress Notes (Incomplete)
Clinical Summary April Ayala is a 46 y.o.female seen as a new consult, referred by Dr Posey Pronto for the following medical problems.  1.Chest pain   Past Medical History:  Diagnosis Date   Angular cheilitis 07/17/2020   Anxiety    BOOP (bronchiolitis obliterans with organizing pneumonia) ([place]) 07/09/2020   Cervical cancer ([place]) 04/2011   Stage 1B squamous cell   Chronic left shoulder pain 10/07/2017   Chronic tension-type headache, not intractable 10/07/2017   Depression    High cholesterol    History of kidney stones    passed   Morbid obesity with BMI of 40.0-44.9, adult ([place]) 09/29/2019   Prehypertension 06/26/2020   S/P lumbar fusion 04/20/2018     No Known Allergies   Current Outpatient Medications  Medication Sig Dispense Refill   albuterol (VENTOLIN HFA) 108 (90 Base) MCG/ACT inhaler Inhale 2 puffs into the lungs every 4 (four) hours as needed for wheezing or shortness of breath. 18 g 2   amitriptyline (ELAVIL) 75 MG tablet TAKE 1 TABLET BY MOUTH AT BEDTIME (Patient taking differently: Take 75 mg by mouth at bedtime.) 30 tablet 0   buPROPion (WELLBUTRIN SR) 150 MG 12 hr tablet Take 1 tablet (150 mg total) by mouth 2 (two) times daily. 60 tablet 2   busPIRone (BUSPAR) 7.5 MG tablet Take 1 tablet (7.5 mg total) by mouth 2 (two) times daily. 60 tablet 5   cyclobenzaprine (FLEXERIL) 10 MG tablet Take 10 mg by mouth 3 (three) times daily.      fluticasone (FLONASE) 50 MCG/ACT nasal spray Place 2 sprays into both nostrils daily. 16 g 6   fluticasone furoate-vilanterol (BREO ELLIPTA) 200-25 MCG/INH AEPB Inhale 1 puff into the lungs daily. 30 each 2   gabapentin (NEURONTIN) 300 MG capsule Take 600 mg by mouth 3 (three) times daily.     Galcanezumab-gnlm (EMGALITY) 120 MG/ML SOAJ INJECT 120 MG SUBCUTANEOUSLY EVERY 30 DAYS (Patient taking differently: Inject 120 mg into the skin every 30 (thirty) days. INJECT 120 MG SUBCUTANEOUSLY EVERY 30 DAYS) 1 mL 11   ipratropium (ATROVENT)  0.03 % nasal spray Place 2 sprays into both nostrils every 12 (twelve) hours. 30 mL 0   lidocaine (LIDODERM) 5 % Place 1 patch onto the skin daily. Remove & Discard patch within 12 hours or as directed by MD 7 patch 0   meclizine (ANTIVERT) 25 MG tablet TAKE 1 TABLET BY MOUTH THREE TIMES DAILY AS NEEDED FOR DIZZINESS (Patient taking differently: Take 25 mg by mouth 3 (three) times daily as needed for dizziness.) 21 tablet 0   naproxen (NAPROSYN) 500 MG tablet Take 1 tablet (500 mg total) by mouth 2 (two) times daily with a meal. 10 tablet 0   omeprazole (PRILOSEC) 20 MG capsule Take 1 capsule (20 mg total) by mouth daily. 30 capsule 3   oxyCODONE-acetaminophen (PERCOCET) 10-325 MG tablet Take 1 tablet by mouth every 4 (four) hours as needed.     oxymorphone (OPANA ER) 15 MG 12 hr tablet Take 15 mg by mouth 2 (two) times daily as needed for pain.     pregabalin (LYRICA) 150 MG capsule Take 150 mg by mouth 2 (two) times daily.     rizatriptan (MAXALT-MLT) 10 MG disintegrating tablet Take 1 tablet (10 mg total) by mouth as needed for migraine. May repeat in 2 hours if needed 9 tablet 5   rosuvastatin (CRESTOR) 5 MG tablet Take 1 tablet (5 mg total) by mouth daily. 30 tablet 1  varenicline (CHANTIX CONTINUING MONTH PAK) 1 MG tablet Take 1 tablet (1 mg total) by mouth 2 (two) times daily. From second week. 60 tablet 2   No current facility-administered medications for this visit.     Past Surgical History:  Procedure Laterality Date   ABDOMINAL HYSTERECTOMY     BREAST BIOPSY Right    CERVICAL CONIZATION W/BX  03/24/2012   Procedure: CONIZATION CERVIX WITH BIOPSY;  Surgeon: Woodroe Mode, MD;  Location: [facility];  Service: Gynecology;  Laterality: N/A;   DIAGNOSTIC LAPAROSCOPY  2007   ectopic   ECTOPIC PREGNANCY SURGERY     ESSURE TUBAL LIGATION  2010   INSERTION OF SUPRAPUBIC CATHETER  05/03/2012   Procedure: INSERTION OF SUPRAPUBIC CATHETER;  Surgeon: Alvino Chapel, MD;  Location: [facility];  Service: Gynecology;;   LEEP     LUMBAR LAMINECTOMY/DECOMPRESSION MICRODISCECTOMY N/A 04/20/2018   Procedure: LUMBAR FIVE TO SACRAL ONE DECOMPRESSION AND FUSION, POSSIBLE LUMBAR FOUR TO SACRAL ONE; SCREW AND CAGE INSTRUMENTATION;  Surgeon: Melina Schools, MD;  Location: [facility];  Service: Orthopedics;  Laterality: N/A;  5 hrs   LYMPHADENECTOMY  05/03/2012   Procedure: LYMPHADENECTOMY;  Surgeon: Alvino Chapel, MD;  Location: [facility];  Service: Gynecology;  Laterality: Bilateral;  pelvic   RADICAL HYSTERECTOMY  05/03/2012   Procedure: RADICAL HYSTERECTOMY;  Surgeon: Alvino Chapel, MD;  Location: [facility];  Service: Gynecology;;  transposition of the ovaries   SPINE SURGERY     TUBAL LIGATION       No Known Allergies    Family History  Problem Relation Age of Onset   Diabetes Mother    Hypertension Father    Diabetes Sister    Migraines Sister    Hypothyroidism Sister    Migraines Sister    Migraines Sister      Social History April Ayala reports that she quit smoking about 5 weeks ago. Her smoking use included cigarettes. She started smoking about 2 years ago. She has a 10.00 pack-year smoking history. She has never used smokeless tobacco. April Ayala reports no history of alcohol use.   Review of Systems CONSTITUTIONAL: No weight loss, fever, chills, weakness or fatigue.  HEENT: Eyes: No visual loss, blurred vision, double vision or yellow sclerae.No hearing loss, sneezing, congestion, runny nose or sore throat.  SKIN: No rash or itching.  CARDIOVASCULAR:  RESPIRATORY: No shortness of breath, cough or sputum.  GASTROINTESTINAL: No anorexia, nausea, vomiting or diarrhea. No abdominal pain or blood.  GENITOURINARY: No burning on urination, no polyuria NEUROLOGICAL: No headache, dizziness, syncope, paralysis, ataxia, numbness or tingling in the extremities. No change in bowel or bladder control.  MUSCULOSKELETAL: No muscle, back pain, joint pain or  stiffness.  LYMPHATICS: No enlarged nodes. No history of splenectomy.  PSYCHIATRIC: No history of depression or anxiety.  ENDOCRINOLOGIC: No reports of sweating, cold or heat intolerance. No polyuria or polydipsia.  Marland Kitchen   Physical Examination There were no vitals filed for this visit. There were no vitals filed for this visit.  Gen: resting comfortably, no acute distress HEENT: no scleral icterus, pupils equal round and reactive, no palptable cervical adenopathy,  CV Resp: Clear to auscultation bilaterally GI: abdomen is soft, non-tender, non-distended, normal bowel sounds, no hepatosplenomegaly MSK: extremities are warm, no edema.  Skin: warm, no rash Neuro:  no focal deficits Psych: appropriate affect   Diagnostic Studies     Assessment and Plan        Arnoldo Lenis, M.D., F.A.C.C.
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
## 2021-08-19 ENCOUNTER — Ambulatory Visit: Payer: Self-pay | Admitting: Orthopedic Surgery

## 2021-08-19 ENCOUNTER — Telehealth: Payer: Self-pay

## 2021-08-19 NOTE — Telephone Encounter (Signed)
Estill Bamberg advised with verbal understanding

## 2021-08-19 NOTE — Telephone Encounter (Signed)
April Ayala from [facility] called received surgery clearance forms from our office for up coming surgery, Patient did not see the Cardiologist at her last appt. Is it okay to proceed with the procedure or postpone until the patient sees her cardiologist?  Please contact Amanda back at # [phone].
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
## 2021-08-20 ENCOUNTER — Other Ambulatory Visit: Payer: Self-pay | Admitting: *Deleted

## 2021-08-20 ENCOUNTER — Encounter: Payer: Self-pay | Admitting: Internal Medicine

## 2021-08-20 ENCOUNTER — Other Ambulatory Visit: Payer: Self-pay | Admitting: Internal Medicine

## 2021-08-20 DIAGNOSIS — Z720 Tobacco use: Secondary | ICD-10-CM

## 2021-08-20 DIAGNOSIS — F418 Other specified anxiety disorders: Secondary | ICD-10-CM

## 2021-08-20 MED ORDER — VARENICLINE TARTRATE 1 MG PO TABS: 1.0000 mg | ORAL_TABLET | Freq: Two times a day (BID) | ORAL | 2 refills | Status: DC

## 2021-08-20 NOTE — Pre-Procedure Instructions (Signed)
Surgical Instructions    Your procedure is scheduled on Thursday, February 23rd.  Report to [facility] Main Entrance "A" at 10:00 A.M., then check in with the Admitting office.  Call this number if you have problems the morning of surgery:  [phone]   If you have any questions prior to your surgery date call [phone]: Open Monday-Friday 8am-4pm    Remember:  Do not eat after midnight the night before your surgery  You may drink clear liquids until 09:00 AM the morning of your surgery.   Clear liquids allowed are: Water, Non-Citrus Juices (without pulp), Carbonated Beverages, Clear Tea, Black Coffee Only (NO MILK, CREAM OR POWDERED CREAMER of any kind), and Gatorade.    Take these medicines the morning of surgery with A SIP OF WATER  buPROPion (WELLBUTRIN SR) busPIRone (BUSPAR)  cyclobenzaprine (FLEXERIL) fluticasone (FLONASE)  fluticasone furoate-vilanterol (BREO ELLIPTA) gabapentin (NEURONTIN) ipratropium (ATROVENT) 0.03 % nasal spray omeprazole (PRILOSEC)  pregabalin (LYRICA)  rosuvastatin (CRESTOR)   If needed: albuterol (VENTOLIN HFA) inhaler- if needed, bring with you on the day of surgery meclizine (ANTIVERT)  oxyCODONE-acetaminophen (PERCOCET) oxymorphone (OPANA ER)  rizatriptan (MAXALT-MLT)   As of today, STOP taking any Aspirin (unless otherwise instructed by your surgeon) Aleve, Naproxen, Ibuprofen, Motrin, Advil, Goody's, BC's, all herbal medications, fish oil, and all vitamins.                     Do NOT Smoke (Tobacco/Vaping) for 24 hours prior to your procedure.  If you use a CPAP at night, you may bring your mask/headgear for your overnight stay.   Contacts, glasses, piercing's, hearing aid's, dentures or partials may not be worn into surgery, please bring cases for these belongings.    For patients admitted to the hospital, discharge time will be determined by your treatment team.   Patients discharged the day of surgery will not be allowed to  drive home, and someone needs to stay with them for 24 hours.  NO VISITORS WILL BE ALLOWED IN PRE-OP WHERE PATIENTS ARE PREPPED FOR SURGERY.  ONLY 1 SUPPORT PERSON MAY BE PRESENT IN THE WAITING ROOM WHILE YOU ARE IN SURGERY.  IF YOU ARE TO BE ADMITTED, ONCE YOU ARE IN YOUR ROOM YOU WILL BE ALLOWED TWO (2) VISITORS. (1) VISITOR MAY STAY OVERNIGHT BUT MUST ARRIVE TO THE ROOM BY 8pm.  Minor children may have two parents present. Special consideration for safety and communication needs will be reviewed on a case by case basis.   Special instructions:   [place]- Preparing For Surgery  Before surgery, you can play an important role. Because skin is not sterile, your skin needs to be as free of germs as possible. You can reduce the number of germs on your skin by washing with CHG (chlorahexidine gluconate) Soap before surgery.  CHG is an antiseptic cleaner which kills germs and bonds with the skin to continue killing germs even after washing.    Oral Hygiene is also important to reduce your risk of infection.  Remember - BRUSH YOUR TEETH THE MORNING OF SURGERY WITH YOUR REGULAR TOOTHPASTE  Please do not use if you have an allergy to CHG or antibacterial soaps. If your skin becomes reddened/irritated stop using the CHG.  Do not shave (including legs and underarms) for at least 48 hours prior to first CHG shower. It is OK to shave your face.  Please follow these instructions carefully.   Shower the NIGHT BEFORE SURGERY and the MORNING OF SURGERY  If you chose to wash your hair, wash your hair first as usual with your normal shampoo.  After you shampoo, rinse your hair and body thoroughly to remove the shampoo.  Use CHG Soap as you would any other liquid soap. You can apply CHG directly to the skin and wash gently with a scrungie or a clean washcloth.   Apply the CHG Soap to your body ONLY FROM THE NECK DOWN.  Do not use on open wounds or open sores. Avoid contact with your eyes, ears, mouth and  genitals (private parts). Wash Face and genitals (private parts)  with your normal soap.   Wash thoroughly, paying special attention to the area where your surgery will be performed.  Thoroughly rinse your body with warm water from the neck down.  DO NOT shower/wash with your normal soap after using and rinsing off the CHG Soap.  Pat yourself dry with a CLEAN TOWEL.  Wear CLEAN PAJAMAS to bed the night before surgery  Place CLEAN SHEETS on your bed the night before your surgery  DO NOT SLEEP WITH PETS.   Day of Surgery: Shower with CHG soap. Do not wear jewelry, make up, nail polish, gel polish, artificial nails, or any other type of covering on natural nails including finger and toenails. If patients have artificial nails, gel coating, etc. that need to be removed by a nail salon please have this removed prior to surgery. Surgery may need to be canceled/delayed if the surgeon/anesthesiologist feels like the patient is unable to be adequately monitored. Do not wear lotions, powders, perfumes, or deodorant. Do not shave 48 hours prior to surgery.   Do not bring valuables to the hospital. [facility] is not responsible for any belongings or valuables. Wear Clean/Comfortable clothing the morning of surgery Remember to brush your teeth WITH YOUR REGULAR TOOTHPASTE.   Please read over the following fact sheets that you were given.   3 days prior to your procedure or After your COVID test   You are not required to quarantine however you are required to wear a well-fitting mask when you are out and around people not in your household. If your mask becomes wet or soiled, replace with a new one.   Wash your hands often with soap and water for 20 seconds or clean your hands with an alcohol-based hand sanitizer that contains at least 60% alcohol.   Do not share personal items.   Notify your provider:  o if you are in close contact with someone who has COVID  o or if you develop a  fever of 100.4 or greater, sneezing, cough, sore throat, shortness of breath or body aches.

## 2021-08-21 ENCOUNTER — Inpatient Hospital Stay (HOSPITAL_COMMUNITY)
Admission: RE | Admit: 2021-08-21 | Discharge: 2021-08-21 | Disposition: A | Discharge status: Home / Self Care | Payer: Medicaid Other | Source: Ambulatory Visit

## 2021-08-21 NOTE — Progress Notes (Signed)
Attempted to call pt twice. LVM for pt to return call to [facility] regarding missed PAT appt. Called number listed for father and the person that answered said that was the wrong number.

## 2021-08-21 NOTE — Telephone Encounter (Signed)
Per Dr Posey Pronto, the pt has completed portions of the form that was for the provider.  I have called the pt to advise that we need new forms to be sent to out office, via mail, or Fax.  Pt understood.

## 2021-08-26 ENCOUNTER — Inpatient Hospital Stay (HOSPITAL_COMMUNITY)
Admission: RE | Admit: 2021-08-26 | Discharge: 2021-08-26 | Disposition: A | Discharge status: Home / Self Care | Payer: Medicaid Other | Source: Ambulatory Visit

## 2021-08-26 NOTE — Pre-Procedure Instructions (Signed)
Surgical Instructions    Your procedure is scheduled on Thursday, February 23rd.  Report to [facility] Main Entrance "A" at 10:00 A.M., then check in with the Admitting office.  Call this number if you have problems the morning of surgery:  [phone]   If you have any questions prior to your surgery date call [phone]: Open Monday-Friday 8am-4pm    Remember:  Do not eat after midnight the night before your surgery  You may drink clear liquids until 09:00 AM the morning of your surgery.   Clear liquids allowed are: Water, Non-Citrus Juices (without pulp), Carbonated Beverages, Clear Tea, Black Coffee Only (NO MILK, CREAM OR POWDERED CREAMER of any kind), and Gatorade.    Take these medicines the morning of surgery with A SIP OF WATER   buPROPion (WELLBUTRIN SR) busPIRone (BUSPAR)  cyclobenzaprine (FLEXERIL) fluticasone (FLONASE)  fluticasone furoate-vilanterol (BREO ELLIPTA) gabapentin (NEURONTIN) ipratropium (ATROVENT) 0.03 % nasal spray omeprazole (PRILOSEC)  pregabalin (LYRICA)  rosuvastatin (CRESTOR)   If needed: albuterol (VENTOLIN HFA) inhaler- if needed, bring with you on the day of surgery Carboxymethylcellul-Glycerin (LUBRICATING EYE DROPS OP) meclizine (ANTIVERT)  oxyCODONE-acetaminophen (PERCOCET) oxymorphone (OPANA ER)  rizatriptan (MAXALT-MLT)   As of today, STOP taking any Aspirin (unless otherwise instructed by your surgeon) Aleve, Naproxen, Ibuprofen, Motrin, Advil, Goody's, BC's, all herbal medications, fish oil, and all vitamins.                     Do NOT Smoke (Tobacco/Vaping) for 24 hours prior to your procedure.  If you use a CPAP at night, you may bring your mask/headgear for your overnight stay.   Contacts, glasses, piercing's, hearing aid's, dentures or partials may not be worn into surgery, please bring cases for these belongings.    For patients admitted to the hospital, discharge time will be determined by your treatment team.    Patients discharged the day of surgery will not be allowed to drive home, and someone needs to stay with them for 24 hours.  NO VISITORS WILL BE ALLOWED IN PRE-OP WHERE PATIENTS ARE PREPPED FOR SURGERY.  ONLY 1 SUPPORT PERSON MAY BE PRESENT IN THE WAITING ROOM WHILE YOU ARE IN SURGERY.  IF YOU ARE TO BE ADMITTED, ONCE YOU ARE IN YOUR ROOM YOU WILL BE ALLOWED TWO (2) VISITORS. (1) VISITOR MAY STAY OVERNIGHT BUT MUST ARRIVE TO THE ROOM BY 8pm.  Minor children may have two parents present. Special consideration for safety and communication needs will be reviewed on a case by case basis.   Special instructions:   [place]- Preparing For Surgery  Before surgery, you can play an important role. Because skin is not sterile, your skin needs to be as free of germs as possible. You can reduce the number of germs on your skin by washing with CHG (chlorahexidine gluconate) Soap before surgery.  CHG is an antiseptic cleaner which kills germs and bonds with the skin to continue killing germs even after washing.    Oral Hygiene is also important to reduce your risk of infection.  Remember - BRUSH YOUR TEETH THE MORNING OF SURGERY WITH YOUR REGULAR TOOTHPASTE  Please do not use if you have an allergy to CHG or antibacterial soaps. If your skin becomes reddened/irritated stop using the CHG.  Do not shave (including legs and underarms) for at least 48 hours prior to first CHG shower. It is OK to shave your face.  Please follow these instructions carefully.   Shower the [employer] BEFORE SURGERY  and the MORNING OF SURGERY  If you chose to wash your hair, wash your hair first as usual with your normal shampoo.  After you shampoo, rinse your hair and body thoroughly to remove the shampoo.  Use CHG Soap as you would any other liquid soap. You can apply CHG directly to the skin and wash gently with a scrungie or a clean washcloth.   Apply the CHG Soap to your body ONLY FROM THE NECK DOWN.  Do not use on open  wounds or open sores. Avoid contact with your eyes, ears, mouth and genitals (private parts). Wash Face and genitals (private parts)  with your normal soap.   Wash thoroughly, paying special attention to the area where your surgery will be performed.  Thoroughly rinse your body with warm water from the neck down.  DO NOT shower/wash with your normal soap after using and rinsing off the CHG Soap.  Pat yourself dry with a CLEAN TOWEL.  Wear CLEAN PAJAMAS to bed the night before surgery  Place CLEAN SHEETS on your bed the night before your surgery  DO NOT SLEEP WITH PETS.   Day of Surgery: Shower with CHG soap. Do not wear jewelry, make up, nail polish, gel polish, artificial nails, or any other type of covering on natural nails including finger and toenails. If patients have artificial nails, gel coating, etc. that need to be removed by a nail salon please have this removed prior to surgery. Surgery may need to be canceled/delayed if the surgeon/anesthesiologist feels like the patient is unable to be adequately monitored. Do not wear lotions, powders, perfumes, or deodorant. Do not shave 48 hours prior to surgery.   Do not bring valuables to the hospital. [facility] is not responsible for any belongings or valuables. Wear Clean/Comfortable clothing the morning of surgery Remember to brush your teeth WITH YOUR REGULAR TOOTHPASTE.   Please read over the following fact sheets that you were given.   3 days prior to your procedure or After your COVID test   You are not required to quarantine however you are required to wear a well-fitting mask when you are out and around people not in your household. If your mask becomes wet or soiled, replace with a new one.   Wash your hands often with soap and water for 20 seconds or clean your hands with an alcohol-based hand sanitizer that contains at least 60% alcohol.   Do not share personal items.   Notify your provider:  o if you are in  close contact with someone who has COVID  o or if you develop a fever of 100.4 or greater, sneezing, cough, sore throat, shortness of breath or body aches.

## 2021-08-28 ENCOUNTER — Ambulatory Visit (HOSPITAL_COMMUNITY): Admission: RE | Admit: 2021-08-28 | Payer: Medicaid Other | Source: Home / Self Care | Admitting: Orthopedic Surgery

## 2021-08-28 ENCOUNTER — Encounter (HOSPITAL_COMMUNITY): Admission: RE | Payer: Self-pay | Source: Home / Self Care

## 2021-08-28 DIAGNOSIS — M5136 Other intervertebral disc degeneration, lumbar region: Secondary | ICD-10-CM | POA: Insufficient documentation

## 2021-08-28 SURGERY — KYPHOPLASTY
Anesthesia: Regional

## 2021-09-01 NOTE — Telephone Encounter (Signed)
Patient came by the office and dropped off more DMV medical forms.  Copied Noted Sleeved

## 2021-09-03 ENCOUNTER — Encounter: Payer: Self-pay | Admitting: Internal Medicine

## 2021-09-04 ENCOUNTER — Other Ambulatory Visit: Payer: Self-pay | Admitting: *Deleted

## 2021-09-04 DIAGNOSIS — F419 Anxiety disorder, unspecified: Secondary | ICD-10-CM

## 2021-09-04 DIAGNOSIS — G43809 Other migraine, not intractable, without status migrainosus: Secondary | ICD-10-CM

## 2021-09-04 MED ORDER — AMITRIPTYLINE HCL 75 MG PO TABS: 75.0000 mg | ORAL_TABLET | Freq: Every day | ORAL | 0 refills | Status: DC

## 2021-09-06 ENCOUNTER — Other Ambulatory Visit: Payer: Self-pay | Admitting: Internal Medicine

## 2021-09-06 DIAGNOSIS — E7841 Elevated Lipoprotein(a): Secondary | ICD-10-CM

## 2021-09-07 ENCOUNTER — Other Ambulatory Visit: Payer: Self-pay | Admitting: Internal Medicine

## 2021-09-07 DIAGNOSIS — F418 Other specified anxiety disorders: Secondary | ICD-10-CM

## 2021-09-15 ENCOUNTER — Encounter: Payer: Medicaid Other | Admitting: Internal Medicine

## 2021-09-17 ENCOUNTER — Telehealth: Payer: Medicaid Other | Admitting: Nurse Practitioner

## 2021-09-17 DIAGNOSIS — M545 Low back pain, unspecified: Secondary | ICD-10-CM

## 2021-09-17 DIAGNOSIS — R399 Unspecified symptoms and signs involving the genitourinary system: Secondary | ICD-10-CM

## 2021-09-17 NOTE — Progress Notes (Signed)
Based on what you shared with me it looks like you have uti symptoms with back pain,that should be evaluated in a face to face office visit. Due to the associating back pain you will need a urinalysis and urine culture for proper treatment. NOTE: There will be NO CHARGE for this eVisit   If you are having a true medical emergency please call 911.      For an urgent face to face visit, [facility] has six urgent care centers for your convenience:     [facility] [place] Get Driving Directions [phone] [address] [place], [place] [address]    [facility] ([place]) Get Driving Directions [phone] [address] [place], [place] [address]  [facility] ([place]) Get Driving Directions [phone] [address] [place],  [place]  [address]  [facility] Get Driving Directions [phone] [address] [place], [place] [address]   [facility] [place] Get Driving Directions  [phone] [address] [place], [place] [address]    [facility] at [facility] Get Driving Directions [phone] [address] [place],  [address]  Your MyChart E-visit questionnaire answers were reviewed by a board certified advanced clinical practitioner to complete your personal care plan based on your specific symptoms.  Thank you for using e-Visits.         

## 2021-09-24 ENCOUNTER — Encounter: Payer: Self-pay | Admitting: Internal Medicine

## 2021-09-24 ENCOUNTER — Ambulatory Visit (INDEPENDENT_AMBULATORY_CARE_PROVIDER_SITE_OTHER): Payer: Medicaid Other | Admitting: Internal Medicine

## 2021-09-24 ENCOUNTER — Other Ambulatory Visit: Payer: Self-pay

## 2021-09-24 VITALS — BP 96/68 | HR 91 | Ht 60.0 in | Wt 241.6 lb

## 2021-09-24 DIAGNOSIS — R079 Chest pain, unspecified: Secondary | ICD-10-CM | POA: Diagnosis not present

## 2021-09-24 NOTE — Progress Notes (Signed)
? ?Cardiology Office Note ? ? ?Date:  09/24/2021  ? ?ID:  April Ayala, DOB [birth year], MRN [id number] ? ?PCP:  Lindell Spar, MD  ?Cardiologist:   Dorris Carnes, MD  ? ?Pt referred for CP  ? ?  ?History of Present Illness: ?April Ayala is a 46 y.o. female with a history of L and R sided CP   Went to ED in Oct 2022  AT that time occurred with mental stress ? ?Still having a CP a  couple times per week  Episdoes lasst 30 min to 1hour    Relaxing and clearing mind can make it bteerr  ?Patient admits to not being too active due to back problems  ?Different from acid   reflux ? ?Diet:    ?Breakfast   Ham and egg croissant    [employer] or coffe ?Lunch    Apples during day      Fruit ?Dinner   Veggie/starch/meat   SOda   Dr pepper   ? ? ?Current Meds  ?Medication Sig  ? albuterol (VENTOLIN HFA) 108 (90 Base) MCG/ACT inhaler Inhale 2 puffs into the lungs every 4 (four) hours as needed for wheezing or shortness of breath.  ? amitriptyline (ELAVIL) 75 MG tablet Take 1 tablet (75 mg total) by mouth at bedtime.  ? buPROPion (WELLBUTRIN SR) 150 MG 12 hr tablet Take 1 tablet by mouth twice daily  ? busPIRone (BUSPAR) 7.5 MG tablet Take 1 tablet by mouth twice daily  ? Carboxymethylcellul-Glycerin (LUBRICATING EYE DROPS OP) Place 1 drop into both eyes daily as needed (dry eyes).  ? Cholecalciferol (DIALYVITE VITAMIN D 5000) 125 MCG (5000 UT) capsule Take 10,000 Units by mouth daily.  ? fluticasone (FLONASE) 50 MCG/ACT nasal spray Place 2 sprays into both nostrils daily. (Patient taking differently: Place 2 sprays into both nostrils daily as needed for allergies.)  ? fluticasone furoate-vilanterol (BREO ELLIPTA) 200-25 MCG/INH AEPB Inhale 1 puff into the lungs daily. (Patient taking differently: Inhale 1 puff into the lungs daily as needed (shortness of breath).)  ? gabapentin (NEURONTIN) 300 MG capsule Take 600 mg by mouth 3 (three) times daily.  ? Galcanezumab-gnlm (EMGALITY) 120 MG/ML SOAJ INJECT 120 MG  SUBCUTANEOUSLY EVERY 30 DAYS (Patient taking differently: Inject 120 mg into the skin every 30 (thirty) days. INJECT 120 MG SUBCUTANEOUSLY EVERY 30 DAYS)  ? ipratropium (ATROVENT) 0.03 % nasal spray Place 2 sprays into both nostrils every 12 (twelve) hours.  ? lidocaine (LIDODERM) 5 % Place 1 patch onto the skin daily. Remove & Discard patch within 12 hours or as directed by MD  ? meclizine (ANTIVERT) 25 MG tablet TAKE 1 TABLET BY MOUTH THREE TIMES DAILY AS NEEDED FOR DIZZINESS  ? omeprazole (PRILOSEC) 20 MG capsule Take 1 capsule (20 mg total) by mouth daily.  ? oxyCODONE-acetaminophen (PERCOCET) 10-325 MG tablet Take 1 tablet by mouth every 4 (four) hours as needed for pain.  ? oxymorphone (OPANA ER) 15 MG 12 hr tablet Take 15 mg by mouth 2 (two) times daily as needed for pain.  ? rizatriptan (MAXALT-MLT) 10 MG disintegrating tablet Take 1 tablet (10 mg total) by mouth as needed for migraine. May repeat in 2 hours if needed  ? rosuvastatin (CRESTOR) 5 MG tablet Take 1 tablet by mouth once daily  ? varenicline (CHANTIX CONTINUING MONTH PAK) 1 MG tablet Take 1 tablet (1 mg total) by mouth 2 (two) times daily. From second week.  ? ? ? ?Allergies:   Patient has no  known allergies.  ? ?Past Medical History:  ?Diagnosis Date  ? Angular cheilitis 07/17/2020  ? Anxiety   ? BOOP (bronchiolitis obliterans with organizing pneumonia) ([place]) 07/09/2020  ? Cervical cancer ([place]) 04/2011  ? Stage 1B squamous cell  ? Chronic left shoulder pain 10/07/2017  ? Chronic tension-type headache, not intractable 10/07/2017  ? Depression   ? High cholesterol   ? History of kidney stones   ? passed  ? Morbid obesity with BMI of 40.0-44.9, adult ([place]) 09/29/2019  ? Prehypertension 06/26/2020  ? S/P lumbar fusion 04/20/2018  ? ? ?Past Surgical History:  ?Procedure Laterality Date  ? ABDOMINAL HYSTERECTOMY    ? BREAST BIOPSY Right   ? CERVICAL CONIZATION W/BX  03/24/2012  ? Procedure: CONIZATION CERVIX WITH BIOPSY;  Surgeon: Woodroe Mode, MD;   Location: [facility];  Service: Gynecology;  Laterality: N/A;  ? DIAGNOSTIC LAPAROSCOPY  2007  ? ectopic  ? ECTOPIC PREGNANCY SURGERY    ? ESSURE TUBAL LIGATION  2010  ? INSERTION OF SUPRAPUBIC CATHETER  05/03/2012  ? Procedure: INSERTION OF SUPRAPUBIC CATHETER;  Surgeon: Alvino Chapel, MD;  Location: [facility];  Service: Gynecology;;  ? LEEP    ? LUMBAR LAMINECTOMY/DECOMPRESSION MICRODISCECTOMY N/A 04/20/2018  ? Procedure: LUMBAR FIVE TO SACRAL ONE DECOMPRESSION AND FUSION, POSSIBLE LUMBAR FOUR TO SACRAL ONE; SCREW AND CAGE INSTRUMENTATION;  Surgeon: Melina Schools, MD;  Location: [facility];  Service: Orthopedics;  Laterality: N/A;  5 hrs  ? LYMPHADENECTOMY  05/03/2012  ? Procedure: LYMPHADENECTOMY;  Surgeon: Alvino Chapel, MD;  Location: [facility];  Service: Gynecology;  Laterality: Bilateral;  pelvic  ? RADICAL HYSTERECTOMY  05/03/2012  ? Procedure: RADICAL HYSTERECTOMY;  Surgeon: Alvino Chapel, MD;  Location: [facility];  Service: Gynecology;;  transposition of the ovaries  ? SPINE SURGERY    ? TUBAL LIGATION    ? ? ? ?Social History:  The patient  reports that she quit smoking about 2 months ago. Her smoking use included cigarettes. She started smoking about 2 years ago. She has a 10.00 pack-year smoking history. She has never used smokeless tobacco. She reports that she does not drink alcohol and does not use drugs.  ? ?Family History:  The patient's family history includes Diabetes in her mother and sister; Hypertension in her father; Hypothyroidism in her sister; Migraines in her sister, sister, and sister.  ? ? ?ROS:  Please see the history of present illness. All other systems are reviewed and  Negative to the above problem except as noted.  ? ? ?PHYSICAL EXAM: ?VS:  BP 96/68   Pulse 91   Ht 5' (1.524 m)   Wt 241 lb 9.6 oz (109.6 kg)   LMP 04/11/2012   SpO2 98%   BMI 47.18 kg/m?   ?GEN: R in no acute distress  ?HEENT: normal  ?Neck: no JVD, carotid bruits ?Cardiac: RRR; no murmurs, rubs,  or gallops,no edema  ?Chest   Tender to palpation   L chest   Brings on pain she is experiencing  ?Respiratory:  clear to auscultation bilaterally ?GI: soft, nontender, nondistended, + BS  No hepatomegaly  ?MS: no deformity Moving all extremities   ?Skin: warm and dry, no rash ?Neuro:  Strength and sensation are intact ?Psych: euthymic mood, full affect ? ? ?EKG:  EKG is not  ordered today On 1/30 /23   SR    ? ? ?Lipid Panel ?   ?Component Value Date/Time  ? CHOL 135 09/10/2020 1141  ?  TRIG 102 09/10/2020 1141  ? HDL 49 09/10/2020 1141  ? CHOLHDL 2.8 09/10/2020 1141  ? CHOLHDL 4.4 09/29/2019 1105  ? Denning 67 09/10/2020 1141  ? LDLCALC 122 (H) 09/29/2019 1105  ? ?  ? ?Wt Readings from Last 3 Encounters:  ?09/24/21 241 lb 9.6 oz (109.6 kg)  ?08/11/21 233 lb (105.7 kg)  ?08/03/21 236 lb (107 kg)  ?  ? ? ?ASSESSMENT AND PLAN: ? ?1  Chest pain   APpears musculoskeletal   I do not think cardiac     ? ?2  Morbid obesity   Discussed diet   LImit carbs  mediterranean style    ? ?3   HL   Pt on crestor    LDL 67  HDL 49 (2022)   keep on same regimen  ? ?F/U if symptoms change    ? ?Current medicines are reviewed at length with the patient today.  The patient does not have concerns regarding medicines. ? ?Signed, ?Dorris Carnes, MD  ?09/24/2021 4:45 PM    ?[address], [place], [place]  [address] ?Phone: [phone]; Fax: [phone]  ? ? ?

## 2021-09-24 NOTE — Patient Instructions (Signed)
Medication Instructions:  ?Your physician recommends that you continue on your current medications as directed. Please refer to the Current Medication list given to you today. ? ?*If you need a refill on your cardiac medications before your next appointment, please call your pharmacy* ? ? ?Lab Work: ?NONE ? ?If you have labs (blood work) drawn today and your tests are completely normal, you will receive your results only by: ?MyChart Message (if you have MyChart) OR ?A paper copy in the mail ?If you have any lab test that is abnormal or we need to change your treatment, we will call you to review the results. ? ? ?Testing/Procedures: ?NONE  ? ? ?Follow-Up: ?At [facility], you and your health needs are our priority.  As part of our continuing mission to provide you with exceptional heart care, we have created designated Provider Care Teams.  These Care Teams include your primary Cardiologist (physician) and Advanced Practice Providers (APPs -  Physician Assistants and Nurse Practitioners) who all work together to provide you with the care you need, when you need it. ? ?We recommend signing up for the patient portal called "MyChart".  Sign up information is provided on this After Visit Summary.  MyChart is used to connect with patients for Virtual Visits (Telemedicine).  Patients are able to view lab/test results, encounter notes, upcoming appointments, etc.  Non-urgent messages can be sent to your provider as well.   ?To learn more about what you can do with MyChart, go to [email or web address].   ? ?Your next appointment:   ? As Needed  ? ?The format for your next appointment:   ?In Person ? ?Provider:   ?Dorris Carnes, MD  ? ? ?Other Instructions ?Thank you for choosing [facility]! ? ? ? ?

## 2021-09-27 ENCOUNTER — Other Ambulatory Visit: Payer: Self-pay | Admitting: Internal Medicine

## 2021-09-27 DIAGNOSIS — F419 Anxiety disorder, unspecified: Secondary | ICD-10-CM

## 2021-09-27 DIAGNOSIS — G43809 Other migraine, not intractable, without status migrainosus: Secondary | ICD-10-CM

## 2021-09-30 ENCOUNTER — Other Ambulatory Visit: Payer: Self-pay | Admitting: Internal Medicine

## 2021-09-30 DIAGNOSIS — E7841 Elevated Lipoprotein(a): Secondary | ICD-10-CM

## 2021-10-03 ENCOUNTER — Encounter: Payer: Medicaid Other | Admitting: Physician Assistant

## 2021-10-03 ENCOUNTER — Telehealth: Payer: Medicaid Other | Admitting: Physician Assistant

## 2021-10-03 DIAGNOSIS — R399 Unspecified symptoms and signs involving the genitourinary system: Secondary | ICD-10-CM

## 2021-10-03 MED ORDER — CEPHALEXIN 500 MG PO CAPS: 500.0000 mg | ORAL_CAPSULE | Freq: Two times a day (BID) | ORAL | 0 refills | Status: DC

## 2021-10-03 NOTE — Progress Notes (Signed)
Error. Needed appt for daughter ?

## 2021-10-03 NOTE — Progress Notes (Signed)
Based on what you shared with me it looks like you have urinary symptoms. ,that should be evaluated in a face to face office visit. You did an evisit on 09/17/21 with same symptoms with back pain. You were asked to do a face to face visit at that time. If you are still having sympotms you need a urinalysis and urine culture for proper treatment. With nausea and vomiting youc ould be becoming septic. ? ?NOTE: There will be NO CHARGE for this eVisit ?  ?If you are having a true medical emergency please call 911.   ?  ? For an urgent face to face visit, [facility] has six urgent care centers for your convenience:  ?  ? [facility] ?Get Driving Directions ?[phone] ?[address] ?[place], [place] [address] ?  ? [facility]) ?Get Driving Directions ?[phone] ?[address] ?[place], [place] [address] ? ?[facility] [place] [facility]) ?Get Driving Directions ?[address],  [place]  [address] ? ?[facility] ?Get Driving Directions ?[phone] ?[address] ?[place], [place] [address] ?  ?[facility] [place] ?Get Driving Directions  ?[phone] ?[address] ?[address] ?[place], [place] [address] ?  ?[facility] ?Get Driving Directions ?[phone] ?[address] ?[place], [place] [address] ? ?Your MyChart E-visit questionnaire answers were reviewed by a board certified advanced clinical practitioner to complete your personal care plan based on your specific symptoms.  Thank you for using e-Visits. ?  ? ?

## 2021-10-06 DIAGNOSIS — M5136 Other intervertebral disc degeneration, lumbar region: Secondary | ICD-10-CM | POA: Diagnosis not present

## 2021-10-06 DIAGNOSIS — Z79899 Other long term (current) drug therapy: Secondary | ICD-10-CM | POA: Diagnosis not present

## 2021-10-06 DIAGNOSIS — Z981 Arthrodesis status: Secondary | ICD-10-CM | POA: Diagnosis not present

## 2021-10-06 DIAGNOSIS — Z5181 Encounter for therapeutic drug level monitoring: Secondary | ICD-10-CM | POA: Diagnosis not present

## 2021-10-06 DIAGNOSIS — M4310 Spondylolisthesis, site unspecified: Secondary | ICD-10-CM | POA: Diagnosis not present

## 2021-10-06 DIAGNOSIS — G8929 Other chronic pain: Secondary | ICD-10-CM | POA: Diagnosis not present

## 2021-10-08 ENCOUNTER — Encounter: Payer: Self-pay | Admitting: Internal Medicine

## 2021-10-09 ENCOUNTER — Telehealth: Payer: Medicaid Other | Admitting: Physician Assistant

## 2021-10-09 DIAGNOSIS — B001 Herpesviral vesicular dermatitis: Secondary | ICD-10-CM | POA: Diagnosis not present

## 2021-10-09 MED ORDER — VALACYCLOVIR HCL 1 G PO TABS
2000.0000 mg | ORAL_TABLET | Freq: Two times a day (BID) | ORAL | 0 refills | Status: AC
Start: 2021-10-09 — End: 2021-10-10

## 2021-10-09 NOTE — Progress Notes (Signed)

## 2021-10-09 NOTE — Telephone Encounter (Signed)
Pt advised has appt 4-20 will keep to discuss surgery was cancelled due to insurance ?

## 2021-10-09 NOTE — Progress Notes (Signed)
I have spent 5 minutes in review of e-visit questionnaire, review and updating patient chart, medical decision making and response to patient.   Dulcy Sida Cody Melody Savidge, PA-C    

## 2021-10-11 ENCOUNTER — Other Ambulatory Visit: Payer: Self-pay | Admitting: Internal Medicine

## 2021-10-11 DIAGNOSIS — F418 Other specified anxiety disorders: Secondary | ICD-10-CM

## 2021-10-23 ENCOUNTER — Ambulatory Visit: Payer: Medicaid Other | Admitting: Internal Medicine

## 2021-10-23 ENCOUNTER — Encounter: Payer: Self-pay | Admitting: Internal Medicine

## 2021-10-23 VITALS — BP 118/84 | HR 95 | Resp 18 | Ht 63.0 in | Wt 233.4 lb

## 2021-10-23 DIAGNOSIS — G43809 Other migraine, not intractable, without status migrainosus: Secondary | ICD-10-CM | POA: Diagnosis not present

## 2021-10-23 DIAGNOSIS — Z0001 Encounter for general adult medical examination with abnormal findings: Secondary | ICD-10-CM | POA: Diagnosis not present

## 2021-10-23 DIAGNOSIS — Z720 Tobacco use: Secondary | ICD-10-CM | POA: Diagnosis not present

## 2021-10-23 DIAGNOSIS — G4733 Obstructive sleep apnea (adult) (pediatric): Secondary | ICD-10-CM

## 2021-10-23 DIAGNOSIS — F418 Other specified anxiety disorders: Secondary | ICD-10-CM | POA: Diagnosis not present

## 2021-10-23 DIAGNOSIS — F419 Anxiety disorder, unspecified: Secondary | ICD-10-CM

## 2021-10-23 DIAGNOSIS — G894 Chronic pain syndrome: Secondary | ICD-10-CM | POA: Diagnosis not present

## 2021-10-23 MED ORDER — AMITRIPTYLINE HCL 50 MG PO TABS: 50.0000 mg | ORAL_TABLET | Freq: Every day | ORAL | 2 refills | Status: DC

## 2021-10-23 NOTE — Assessment & Plan Note (Signed)
Has snoring at nighttime ?Has daytime fatigue ?Morbidly obese ?Referred to pulmonology for sleep study ?

## 2021-10-23 NOTE — Assessment & Plan Note (Signed)
Asked about quitting: confirms that she currently smokes cigarettes ?Advise to quit smoking: Educated about QUITTING to reduce the risk of cancer, cardio and cerebrovascular disease. ?Assess willingness: Unwilling to quit at this time, but is working on cutting back. ?Assist with counseling and pharmacotherapy: Counseled for 5 minutes and literature provided.  On Wellbutrin. Advised to use Nicotine patch or gum. ?Arrange for follow up: follow up in 3 months and continue to offer help. ?
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
## 2021-10-23 NOTE — Progress Notes (Signed)
? ?Established Patient Office Visit ? ?Subjective:  ?Patient ID: April Ayala, female    DOB: 1976-03-29  Age: 46 y.o. MRN: [id number] ? ?CC:  ?Chief Complaint  ?Patient presents with  ? Annual Exam  ?  Annual exam  ? ? ?HPI ?April Ayala is a 46 y.o. female with past medical history of HTN, migraine, chronic pain syndrome, depression with anxiety, morbid obesity and tobacco abuse who presents for annual physical. ? ?She has not had T12 kyphoplasty yet due to insurance coverage concern.  She still has acute on chronic low back pain, followed by Dr. Nelva Bush.  She is on Opana and Percocet in addition to gabapentin and Flexeril. ? ?She is willing to cut down dose of Elavil that she was taking for insomnia and migraine.  She is on Emgality and as needed Maxalt currently. ? ? ? ? ?Past Medical History:  ?Diagnosis Date  ? Angular cheilitis 07/17/2020  ? Anxiety   ? BOOP (bronchiolitis obliterans with organizing pneumonia) ([place]) 07/09/2020  ? Cervical cancer ([place]) 04/2011  ? Stage 1B squamous cell  ? Chronic left shoulder pain 10/07/2017  ? Chronic tension-type headache, not intractable 10/07/2017  ? Depression   ? High cholesterol   ? History of kidney stones   ? passed  ? Morbid obesity with BMI of 40.0-44.9, adult ([place]) 09/29/2019  ? Prehypertension 06/26/2020  ? S/P lumbar fusion 04/20/2018  ? ? ?Past Surgical History:  ?Procedure Laterality Date  ? ABDOMINAL HYSTERECTOMY    ? BREAST BIOPSY Right   ? CERVICAL CONIZATION W/BX  03/24/2012  ? Procedure: CONIZATION CERVIX WITH BIOPSY;  Surgeon: Woodroe Mode, MD;  Location: [facility];  Service: Gynecology;  Laterality: N/A;  ? DIAGNOSTIC LAPAROSCOPY  2007  ? ectopic  ? ECTOPIC PREGNANCY SURGERY    ? ESSURE TUBAL LIGATION  2010  ? INSERTION OF SUPRAPUBIC CATHETER  05/03/2012  ? Procedure: INSERTION OF SUPRAPUBIC CATHETER;  Surgeon: Alvino Chapel, MD;  Location: [facility];  Service: Gynecology;;  ? LEEP    ? LUMBAR LAMINECTOMY/DECOMPRESSION MICRODISCECTOMY  N/A 04/20/2018  ? Procedure: LUMBAR FIVE TO SACRAL ONE DECOMPRESSION AND FUSION, POSSIBLE LUMBAR FOUR TO SACRAL ONE; SCREW AND CAGE INSTRUMENTATION;  Surgeon: Melina Schools, MD;  Location: [facility];  Service: Orthopedics;  Laterality: N/A;  5 hrs  ? LYMPHADENECTOMY  05/03/2012  ? Procedure: LYMPHADENECTOMY;  Surgeon: Alvino Chapel, MD;  Location: [facility];  Service: Gynecology;  Laterality: Bilateral;  pelvic  ? RADICAL HYSTERECTOMY  05/03/2012  ? Procedure: RADICAL HYSTERECTOMY;  Surgeon: Alvino Chapel, MD;  Location: [facility];  Service: Gynecology;;  transposition of the ovaries  ? SPINE SURGERY    ? TUBAL LIGATION    ? ? ?Family History  ?Problem Relation Age of Onset  ? Diabetes Mother   ? Hypertension Father   ? Diabetes Sister   ? Migraines Sister   ? Hypothyroidism Sister   ? Migraines Sister   ? Migraines Sister   ? ? ?Social History  ? ?Socioeconomic History  ? Marital status: Significant Other  ?  Spouse name: Not on file  ? Number of children: 4  ? Years of education: Not on file  ? Highest education level: 12th grade  ?Occupational History  ? Occupation: [employer]  ?Tobacco Use  ? Smoking status: Former  ?  Packs/day: 0.50  ?  Years: 20.00  ?  Pack years: 10.00  ?  Types: Cigarettes  ?  Start date: 11/03/2018  ?  Quit date: 07/11/2021  ?  Years since quitting: 0.2  ? Smokeless tobacco: Never  ?Vaping Use  ? Vaping Use: Never used  ?Substance and Sexual Activity  ? Alcohol use: Never  ? Drug use: Never  ? Sexual activity: Yes  ?  Birth control/protection: Surgical  ?  Comment: hyst  ?Other Topics Concern  ? Not on file  ?Social History Narrative  ? Lives with fiance and 1 daughter  ?   ? 1 son is with grandmother   ? Oldest daughter is 6 lives with grandfather  ?   ? Works as a Aeronautical engineer at [employer].    ? Lives in [place], [place].  ?   ? 6 dogs, a  beard dragon, a snake, and a hamster and a cat  ?   ? Enjoys: taking care of her pets  ?   ? Diet: avoids lunch due to work, eats all food  groups  ? Caffeine: monsters in the morning-soda  ? Water: limited  ?   ? Wears seat belt   ? Does not use phone while driving   ? Smoke detectors at home   ?   ? Right handed  ? ?Social Determinants of Health  ? ?Financial Resource Strain: Low Risk   ? Difficulty of Paying Living Expenses: Not hard at all  ?Food Insecurity: No Food Insecurity  ? Worried About Charity fundraiser in the Last Year: Never true  ? Ran Out of Food in the Last Year: Never true  ?Transportation Needs: No Transportation Needs  ? Lack of Transportation (Medical): No  ? Lack of Transportation (Non-Medical): No  ?Physical Activity: Inactive  ? Days of Exercise per Week: 0 days  ? Minutes of Exercise per Session: 0 min  ?Stress: No Stress Concern Present  ? Feeling of Stress : Only a little  ?Social Connections: Socially Isolated  ? Frequency of Communication with Friends and Family: Once a week  ? Frequency of Social Gatherings with Friends and Family: Once a week  ? Attends Religious Services: 1 to 4 times per year  ? Active Member of Clubs or Organizations: No  ? Attends Archivist Meetings: Never  ? Marital Status: Divorced  ?Intimate Partner Violence: Not At Risk  ? Fear of Current or Ex-Partner: No  ? Emotionally Abused: No  ? Physically Abused: No  ? Sexually Abused: No  ? ? ?Outpatient Medications Prior to Visit  ?Medication Sig Dispense Refill  ? albuterol (VENTOLIN HFA) 108 (90 Base) MCG/ACT inhaler Inhale 2 puffs into the lungs every 4 (four) hours as needed for wheezing or shortness of breath. 18 g 2  ? buPROPion (WELLBUTRIN SR) 150 MG 12 hr tablet Take 1 tablet by mouth twice daily 60 tablet 5  ? busPIRone (BUSPAR) 7.5 MG tablet Take 1 tablet by mouth twice daily 60 tablet 0  ? Carboxymethylcellul-Glycerin (LUBRICATING EYE DROPS OP) Place 1 drop into both eyes daily as needed (dry eyes).    ? Cholecalciferol (DIALYVITE VITAMIN D 5000) 125 MCG (5000 UT) capsule Take 10,000 Units by mouth daily.    ? cyclobenzaprine  (FLEXERIL) 10 MG tablet Take 10 mg by mouth 4 (four) times daily.    ? fluticasone (FLONASE) 50 MCG/ACT nasal spray Place 2 sprays into both nostrils daily. (Patient taking differently: Place 2 sprays into both nostrils daily as needed for allergies.) 16 g 6  ? fluticasone furoate-vilanterol (BREO ELLIPTA) 200-25 MCG/INH AEPB Inhale 1 puff into the lungs  daily. (Patient taking differently: Inhale 1 puff into the lungs daily as needed (shortness of breath).) 30 each 2  ? gabapentin (NEURONTIN) 300 MG capsule Take 600 mg by mouth 3 (three) times daily.    ? Galcanezumab-gnlm (EMGALITY) 120 MG/ML SOAJ INJECT 120 MG SUBCUTANEOUSLY EVERY 30 DAYS (Patient taking differently: Inject 120 mg into the skin every 30 (thirty) days. INJECT 120 MG SUBCUTANEOUSLY EVERY 30 DAYS) 1 mL 11  ? ipratropium (ATROVENT) 0.03 % nasal spray Place 2 sprays into both nostrils every 12 (twelve) hours. 30 mL 0  ? lidocaine (LIDODERM) 5 % Place 1 patch onto the skin daily. Remove & Discard patch within 12 hours or as directed by MD 7 patch 0  ? Lidocaine 4 % PTCH Apply 1 patch topically daily as needed (pain).    ? meclizine (ANTIVERT) 25 MG tablet TAKE 1 TABLET BY MOUTH THREE TIMES DAILY AS NEEDED FOR DIZZINESS 21 tablet 0  ? omeprazole (PRILOSEC) 20 MG capsule Take 1 capsule (20 mg total) by mouth daily. 30 capsule 3  ? oxyCODONE-acetaminophen (PERCOCET) 10-325 MG tablet Take 1 tablet by mouth every 4 (four) hours as needed for pain.    ? oxymorphone (OPANA ER) 15 MG 12 hr tablet Take 15 mg by mouth 2 (two) times daily as needed for pain.    ? rizatriptan (MAXALT-MLT) 10 MG disintegrating tablet Take 1 tablet (10 mg total) by mouth as needed for migraine. May repeat in 2 hours if needed 9 tablet 5  ? rosuvastatin (CRESTOR) 5 MG tablet Take 1 tablet by mouth once daily 30 tablet 5  ? varenicline (CHANTIX CONTINUING MONTH PAK) 1 MG tablet Take 1 tablet (1 mg total) by mouth 2 (two) times daily. From second week. 60 tablet 2  ? amitriptyline  (ELAVIL) 75 MG tablet TAKE 1 TABLET BY MOUTH AT BEDTIME 30 tablet 0  ? cephALEXin (KEFLEX) 500 MG capsule Take 1 capsule (500 mg total) by mouth 2 (two) times daily. 14 capsule 0  ? naproxen (NAPROSYN) 500 MG tablet Ta

## 2021-10-23 NOTE — Assessment & Plan Note (Signed)
Diet modification and moderate exercise as tolerated 

## 2021-10-23 NOTE — Assessment & Plan Note (Signed)
On BuSpar and Wellbutrin currently ?On amitriptyline for insomnia, will decrease dose to 50 mg nightly ?She has multiple sedating agents for chronic pain, will try to titrate down amitriptyline slowly ?

## 2021-10-23 NOTE — Patient Instructions (Signed)
Please take Amitryptiline 50 mg instead of 75 mg now. ? ?Please continue taking medications as prescribed. ? ?You are being referred to Pulmonology for sleep apnea evaluation. ?

## 2021-10-23 NOTE — Assessment & Plan Note (Signed)

## 2021-10-23 NOTE — Assessment & Plan Note (Signed)
On Emgality and Maxalt (PRN) ?Followed by Neurology ?

## 2021-10-28 ENCOUNTER — Telehealth: Payer: Self-pay

## 2021-10-28 DIAGNOSIS — Z0279 Encounter for issue of other medical certificate: Secondary | ICD-10-CM

## 2021-10-28 NOTE — Telephone Encounter (Signed)
Called patient to pick up forms 

## 2021-10-28 NOTE — Telephone Encounter (Signed)
?  [employer]V Medical forms ? ?Copied ?Noted ?sleeved ?

## 2021-11-01 ENCOUNTER — Other Ambulatory Visit: Payer: Self-pay | Admitting: Internal Medicine

## 2021-11-01 DIAGNOSIS — F418 Other specified anxiety disorders: Secondary | ICD-10-CM

## 2021-11-03 ENCOUNTER — Telehealth: Payer: Self-pay

## 2021-11-03 NOTE — Telephone Encounter (Signed)
Received PA request for Emgality via CMM. Completed and sent to [facility]. Key: [id number]. PA was approved. PA Case: [id number], Status: Approved, Coverage Starts on: 11/03/2021 12:00:00 AM, Coverage Ends on: 11/03/2022 12:00:00 AM. ?

## 2021-11-09 ENCOUNTER — Other Ambulatory Visit: Payer: Self-pay | Admitting: Internal Medicine

## 2021-11-09 ENCOUNTER — Other Ambulatory Visit: Payer: Self-pay | Admitting: Family Medicine

## 2021-11-09 DIAGNOSIS — F419 Anxiety disorder, unspecified: Secondary | ICD-10-CM

## 2021-11-10 ENCOUNTER — Ambulatory Visit (INDEPENDENT_AMBULATORY_CARE_PROVIDER_SITE_OTHER): Payer: Medicaid Other | Admitting: Adult Health

## 2021-11-10 ENCOUNTER — Encounter: Payer: Self-pay | Admitting: Adult Health

## 2021-11-10 VITALS — BP 100/70 | HR 87 | Temp 98.1°F | Ht 60.0 in | Wt 229.2 lb

## 2021-11-10 DIAGNOSIS — G4719 Other hypersomnia: Secondary | ICD-10-CM | POA: Insufficient documentation

## 2021-11-10 DIAGNOSIS — R0683 Snoring: Secondary | ICD-10-CM | POA: Diagnosis not present

## 2021-11-10 NOTE — Assessment & Plan Note (Signed)
Snoring, restless sleep, daytime sleepiness, BMI 44 all suspicious for underlying sleep apnea.  Patient is on multiple sedating medications including Percocet, Opana, Neurontin, Robaxin, BuSpar and Elavil.  Suspect patient's sleepiness is multifactorial.  Recommend that patient have an in lab sleep study.  We will set up for split-night sleep evaluation. ?Patient education on use of sedating medications.  Advised against driving if sleepy. ?Healthy sleep regimen discussed. ? ?- discussed how weight can impact sleep and risk for sleep disordered breathing ?- discussed options to assist with weight loss: combination of diet modification, cardiovascular and strength training exercises ?  ?- had an extensive discussion regarding the adverse health consequences related to untreated sleep disordered breathing ?- specifically discussed the risks for hypertension, coronary artery disease, cardiac dysrhythmias, cerebrovascular disease, and diabetes ?- lifestyle modification discussed ?  ?- discussed how sleep disruption can increase risk of accidents, particularly when driving ?- safe driving practices were discussed ?  ?Plan  ?Patient Instructions  ?Set up for Split night sleep study  ?Do not drive if sleepy  ?Work on healthy weight  ?Use caution with sedating medications  ?Follow up with in 6-8 weeks to discuss results and treatment plan  ? ?  ? ?
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
## 2021-11-10 NOTE — Patient Instructions (Signed)
Set up for Split night sleep study  ?Do not drive if sleepy  ?Work on healthy weight  ?Use caution with sedating medications  ?Follow up with in 6-8 weeks to discuss results and treatment plan  ? ?

## 2021-11-10 NOTE — Assessment & Plan Note (Signed)
Healthy weight loss discussed 

## 2021-11-10 NOTE — Assessment & Plan Note (Signed)
Excessive daytime sleepiness-concern with patient is on high risk medications with multiple sedating medications.  We will set patient up for sleep study to rule out underlying sleep apnea.  However long discussion with patient regarding her multiple medications that have sedating side effects.  Advised her extensively about avoiding driving if sleepy. ? ?

## 2021-11-12 ENCOUNTER — Ambulatory Visit: Payer: Medicaid Other | Admitting: Internal Medicine

## 2021-11-12 ENCOUNTER — Other Ambulatory Visit: Payer: Self-pay | Admitting: Family Medicine

## 2021-11-14 DIAGNOSIS — M545 Low back pain, unspecified: Secondary | ICD-10-CM | POA: Diagnosis not present

## 2021-11-15 ENCOUNTER — Telehealth: Payer: Medicaid Other | Admitting: Nurse Practitioner

## 2021-11-15 DIAGNOSIS — R21 Rash and other nonspecific skin eruption: Secondary | ICD-10-CM

## 2021-11-15 MED ORDER — PREDNISONE 10 MG (21) PO TBPK: ORAL_TABLET | ORAL | 0 refills | Status: DC

## 2021-11-15 NOTE — Progress Notes (Signed)
E Visit for Rash ? ?We are sorry that you are not feeling well. Here is how we plan to help! ? ?Prednisone 10 mg daily for 6 days (see taper instructions below) ?You can also take benadryl by mouth for itching.  ? ?Directions for 6 day taper: ?Day 1: 2 tablets before breakfast, 1 after both lunch & dinner and 2 at bedtime ?Day 2: 1 tab before breakfast, 1 after both lunch & dinner and 2 at bedtime ?Day 3: 1 tab at each meal & 1 at bedtime ?Day 4: 1 tab at breakfast, 1 at lunch, 1 at bedtime ?Day 5: 1 tab at breakfast & 1 tab at bedtime ?Day 6: 1 tab at breakfast ? ? ? ?HOME CARE: ? ?Take cool showers and avoid direct sunlight. ?Apply cool compress or wet dressings. ?Take a bath in an oatmeal bath.  Sprinkle content of one Aveeno packet under running faucet with comfortably warm water.  Bathe for 15-20 minutes, 1-2 times daily.  Pat dry with a towel. Do not rub the rash. ?Use hydrocortisone cream. ?Take an antihistamine like Benadryl for widespread rashes that itch.  The adult dose of Benadryl is 25-50 mg by mouth 4 times daily. ?Caution:  This type of medication may cause sleepiness.  Do not drink alcohol, drive, or operate dangerous machinery while taking antihistamines.  Do not take these medications if you have prostate enlargement.  Read package instructions thoroughly on all medications that you take. ? ?GET HELP RIGHT AWAY IF: ? ?Symptoms don't go away after treatment. ?Severe itching that persists. ?If you rash spreads or swells. ?If you rash begins to smell. ?If it blisters and opens or develops a yellow-brown crust. ?You develop a fever. ?You have a sore throat. ?You become short of breath. ? ?MAKE SURE YOU: ? ?Understand these instructions. ?Will watch your condition. ?Will get help right away if you are not doing well or get worse. ? ?Thank you for choosing an e-visit. ? ?Your e-visit answers were reviewed by a board certified advanced clinical practitioner to complete your personal care plan. Depending  upon the condition, your plan could have included both over the counter or prescription medications. ? ?Please review your pharmacy choice. Make sure the pharmacy is open so you can pick up prescription now. If there is a problem, you may contact your provider through [employer] and have the prescription routed to another pharmacy.  Your safety is important to [place]. If you have drug allergies check your prescription carefully.  ? ?For the next 24 hours you can use MyChart to ask questions about today's visit, request a non-urgent call back, or ask for a work or school excuse. ?You will get an email in the next two days asking about your experience. I hope that your e-visit has been valuable and will speed your recovery.  ?

## 2021-11-15 NOTE — Progress Notes (Signed)
I have spent 5 minutes in review of e-visit questionnaire, review and updating patient chart, medical decision making and response to patient.  ° °Kenzo Ozment W Majkowski, NP ° °  °

## 2021-11-17 DIAGNOSIS — J84116 Cryptogenic organizing pneumonia: Secondary | ICD-10-CM | POA: Diagnosis not present

## 2021-11-17 DIAGNOSIS — J9691 Respiratory failure, unspecified with hypoxia: Secondary | ICD-10-CM | POA: Diagnosis not present

## 2021-11-27 ENCOUNTER — Telehealth: Payer: Medicaid Other | Admitting: Physician Assistant

## 2021-11-27 DIAGNOSIS — B001 Herpesviral vesicular dermatitis: Secondary | ICD-10-CM | POA: Diagnosis not present

## 2021-11-27 MED ORDER — VALACYCLOVIR HCL 1 G PO TABS: 2000.0000 mg | ORAL_TABLET | Freq: Two times a day (BID) | ORAL | 0 refills | Status: AC

## 2021-11-27 NOTE — Progress Notes (Signed)
I have spent 5 minutes in review of e-visit questionnaire, review and updating patient chart, medical decision making and response to patient.   Dinnis Rog Cody Monicia Tse, PA-C    

## 2021-11-27 NOTE — Progress Notes (Signed)

## 2021-12-09 ENCOUNTER — Encounter: Payer: Self-pay | Admitting: Adult Health

## 2021-12-18 DIAGNOSIS — J84116 Cryptogenic organizing pneumonia: Secondary | ICD-10-CM | POA: Diagnosis not present

## 2021-12-18 DIAGNOSIS — J9691 Respiratory failure, unspecified with hypoxia: Secondary | ICD-10-CM | POA: Diagnosis not present

## 2021-12-19 ENCOUNTER — Telehealth: Payer: Self-pay | Admitting: *Deleted

## 2021-12-19 NOTE — Telephone Encounter (Signed)
ATC patient x1, LVM for patient to return call.  I do not see any results of the split night sleep study in Epic.  I left a detailed vm that if she has not had her Split night sleep study that there is no reason to have her f/u on 6/19.  Will await her return call.

## 2021-12-22 ENCOUNTER — Ambulatory Visit: Payer: Medicaid Other | Admitting: Adult Health

## 2021-12-25 ENCOUNTER — Other Ambulatory Visit: Payer: Self-pay | Admitting: Internal Medicine

## 2021-12-25 NOTE — Telephone Encounter (Signed)
Called patient and informed her that since she has not got the sleep study yet she did not need her follow up at this time and to call the office back to cancel this appointment

## 2021-12-26 ENCOUNTER — Ambulatory Visit: Payer: Medicaid Other | Admitting: Acute Care

## 2021-12-29 DIAGNOSIS — M546 Pain in thoracic spine: Secondary | ICD-10-CM | POA: Diagnosis not present

## 2022-01-01 ENCOUNTER — Telehealth: Payer: Medicaid Other | Admitting: Physician Assistant

## 2022-01-01 DIAGNOSIS — B001 Herpesviral vesicular dermatitis: Secondary | ICD-10-CM | POA: Diagnosis not present

## 2022-01-01 MED ORDER — ACYCLOVIR 5 % EX CREA: 1.0000 | TOPICAL_CREAM | Freq: Every day | CUTANEOUS | 0 refills | Status: DC

## 2022-01-01 NOTE — Progress Notes (Signed)
We are sorry that you are not feeling well.  Here is how we plan to help!  Based on what you have shared with me it does look like you have a viral infection.    Most cold sores or fever blisters are small fluid filled blisters around the mouth caused by herpes simplex virus.  The most common strain of the virus causing cold sores is herpes simplex virus 1.  It can be spread by skin contact, sharing eating utensils, or even sharing towels.  Cold sores are contagious to other people until dry. (Approximately 5-7 days).  Wash your hands. You can spread the virus to your eyes through handling your contact lenses after touching the lesions.  Most people experience pain at the sight or tingling sensations in their lips that may begin before the ulcers erupt.  Herpes simplex is treatable but not curable.  It may lie dormant for a long time and then reappear due to stress or prolonged sun exposure.  Many patients have success in treating their cold sores with an over the counter topical called Abreva.  You may apply the cream up to 5 times daily (maximum 10 days) until healing occurs.  I will send in Zovirax cream to use as directed.  HOME CARE:  Wash your hands frequently. Do not pick at or rub the sore. Don't open the blisters. Avoid kissing other people during this time. Avoid sharing drinking glasses, eating utensils, or razors. Do not handle contact lenses unless you have thoroughly washed your hands with soap and warm water! Avoid oral sex during this time.  Herpes from sores on your mouth can spread to your partner's genital area. Avoid contact with anyone who has eczema or a weakened immune system. Cold sores are often triggered by exposure to intense sunlight, use a lip balm containing a sunscreen (SPF 30 or higher).  GET HELP RIGHT AWAY IF:  Blisters look infected. Blisters occur near or in the eye. Symptoms last longer than 10 days. Your symptoms become worse.  MAKE SURE  YOU:  Understand these instructions. Will watch your condition. Will get help right away if you are not doing well or get worse.    Your e-visit answers were reviewed by a board certified advanced clinical practitioner to complete your personal care plan.  Depending upon the condition, your plan could have  Included both over the counter or prescription medications.    Please review your pharmacy choice.  Be sure that the pharmacy you have chosen is open so that you can pick up your prescription now.  If there is a problem you can message your provider in [place] to have the prescription routed to another pharmacy.    Your safety is important to [place].  If you have drug allergies check our prescription carefully.  For the next 24 hours you can use MyChart to ask questions about today's visit, request a non-urgent call back, or ask for a work or school excuse from your e-visit provider.  You will get an email in the next two days asking about your experience.  I hope that your e-visit has been valuable and will speed your recovery.

## 2022-01-01 NOTE — Progress Notes (Signed)
I have spent 5 minutes in review of e-visit questionnaire, review and updating patient chart, medical decision making and response to patient.   Geraldy Akridge Cody Carlisle Torgeson, PA-C    

## 2022-01-12 ENCOUNTER — Telehealth: Payer: Self-pay | Admitting: Adult Health

## 2022-01-12 NOTE — Telephone Encounter (Signed)
Pt's OV notes have been faxed to provided fax number. Nothing further needed. 

## 2022-01-14 ENCOUNTER — Telehealth: Payer: Self-pay | Admitting: Adult Health

## 2022-01-14 DIAGNOSIS — G4719 Other hypersomnia: Secondary | ICD-10-CM

## 2022-01-14 DIAGNOSIS — R0683 Snoring: Secondary | ICD-10-CM

## 2022-01-17 DIAGNOSIS — J9691 Respiratory failure, unspecified with hypoxia: Secondary | ICD-10-CM | POA: Diagnosis not present

## 2022-01-17 DIAGNOSIS — J84116 Cryptogenic organizing pneumonia: Secondary | ICD-10-CM | POA: Diagnosis not present

## 2022-01-20 ENCOUNTER — Ambulatory Visit (HOSPITAL_COMMUNITY): Payer: Medicaid Other | Attending: Orthopedic Surgery

## 2022-01-25 ENCOUNTER — Other Ambulatory Visit: Payer: Self-pay | Admitting: Internal Medicine

## 2022-01-27 ENCOUNTER — Other Ambulatory Visit: Payer: Self-pay | Admitting: Internal Medicine

## 2022-01-27 DIAGNOSIS — F419 Anxiety disorder, unspecified: Secondary | ICD-10-CM

## 2022-01-27 DIAGNOSIS — G43809 Other migraine, not intractable, without status migrainosus: Secondary | ICD-10-CM

## 2022-01-30 ENCOUNTER — Other Ambulatory Visit: Payer: Self-pay | Admitting: Internal Medicine

## 2022-02-04 ENCOUNTER — Other Ambulatory Visit: Payer: Self-pay | Admitting: Internal Medicine

## 2022-02-04 DIAGNOSIS — E7841 Elevated Lipoprotein(a): Secondary | ICD-10-CM

## 2022-02-06 NOTE — Telephone Encounter (Signed)
PCC's please advise. Thanks 

## 2022-02-08 ENCOUNTER — Telehealth: Payer: Medicaid Other | Admitting: Nurse Practitioner

## 2022-02-08 ENCOUNTER — Encounter: Payer: Medicaid Other | Admitting: Nurse Practitioner

## 2022-02-08 ENCOUNTER — Other Ambulatory Visit: Payer: Self-pay | Admitting: Internal Medicine

## 2022-02-08 DIAGNOSIS — H60331 Swimmer's ear, right ear: Secondary | ICD-10-CM | POA: Diagnosis not present

## 2022-02-08 MED ORDER — CIPROFLOXACIN-HYDROCORTISONE 0.2-1 % OT SUSP: 3.0000 [drp] | Freq: Two times a day (BID) | OTIC | 0 refills | Status: DC

## 2022-02-08 MED ORDER — NEOMYCIN-POLYMYXIN-HC 3.5-10000-1 OT SOLN: 3.0000 [drp] | Freq: Four times a day (QID) | OTIC | 0 refills | Status: DC

## 2022-02-08 NOTE — Progress Notes (Signed)
I have spent 5 minutes in review of e-visit questionnaire, review and updating patient chart, medical decision making and response to patient.  ° °Yosef Krogh W [place]ango, NP ° °  °

## 2022-02-08 NOTE — Progress Notes (Signed)
Duplicate evisit 

## 2022-02-08 NOTE — Addendum Note (Signed)
Addended by: Geryl Rankins on: 02/08/2022 05:54 PM   Modules accepted: Orders

## 2022-02-08 NOTE — Progress Notes (Signed)
E Visit for Swimmer's Ear   We are sorry that you are not feeling well. Here is how we plan to help!   I have prescribed: Ciprofloxin 0.2% and hydrocortisone 1% otic suspension 3 drops in affected ears twice daily for 7 days     In certain cases swimmer's ear may progress to a more serious bacterial infection of the middle or inner ear.  If you have a fever 102 and up and significantly worsening symptoms, this could indicate a more serious infection moving to the middle/inner and needs face to face evaluation in an office by a provider.   Your symptoms should improve over the next 3 days and should resolve in about 7 days.   HOME CARE:   Wash your hands frequently. Do not place the tip of the bottle on your ear or touch it with your fingers. You can take Acetominophen 650 mg every 4-6 hours as needed for pain.  If pain is severe or moderate, you can apply a heating pad (set on low) or hot water bottle (wrapped in a towel) to outer ear for 20 minutes.  This will also increase drainage. Avoid ear plugs Do not use Q-tips After showers, help the water run out by tilting your head to one side.   GET HELP RIGHT AWAY IF:   Fever is over 102.2 degrees. You develop progressive ear pain or hearing loss. Ear symptoms persist longer than 3 days after treatment.   MAKE SURE YOU:   Understand these instructions. Will watch your condition. Will get help right away if you are not doing well or get worse.   TO PREVENT SWIMMER'S EAR: Use a bathing cap or custom fitted swim molds to keep your ears dry. Towel off after swimming to dry your ears. Tilt your head or pull your earlobes to allow the water to escape your ear canal. If there is still water in your ears, consider using a hairdryer on the lowest setting.     Thank you for choosing an e-visit.   Your e-visit answers were reviewed by a board certified advanced clinical practitioner to complete your personal care plan. Depending upon the  condition, your plan could have included both over the counter or prescription medications.   Please review your pharmacy choice. Make sure the pharmacy is open so you can pick up prescription now. If there is a problem, you may contact your provider through [employer] and have the prescription routed to another pharmacy.  Your safety is important to [place]. If you have drug allergies check your prescription carefully.    For the next 24 hours you can use MyChart to ask questions about today's visit, request a non-urgent call back, or ask for a work or school excuse. You will get an email in the next two days asking about your experience. I hope that your e-visit has been valuable and will speed your recovery.

## 2022-02-09 ENCOUNTER — Encounter: Payer: Self-pay | Admitting: Adult Health

## 2022-02-09 NOTE — Telephone Encounter (Signed)
PCCs are you able to look into pt's sleep study denial? Thanks.

## 2022-02-11 ENCOUNTER — Encounter (INDEPENDENT_AMBULATORY_CARE_PROVIDER_SITE_OTHER): Payer: Self-pay

## 2022-02-13 NOTE — Telephone Encounter (Signed)
Attempted to call pt but unable to reach. Left message for her to return call. 

## 2022-02-13 NOTE — Telephone Encounter (Signed)
Pt's insurance will not approve an in-lab study without a HST being completed.  Please order HST if patient is agreeable.

## 2022-02-16 NOTE — Progress Notes (Deleted)
Established patient visit   Patient: April Ayala   DOB: 06-23-1976   46 y.o. Female  MRN: [id number] Visit Date: 02/18/2022  Today's healthcare provider: [name], FNP   No chief complaint on file.  Subjective    HPI  ***  Medications: Outpatient Medications Prior to Visit  Medication Sig   acyclovir cream (ZOVIRAX) 5 % Apply 1 Application topically 5 (five) times daily. For 5-7 days   albuterol (VENTOLIN HFA) 108 (90 Base) MCG/ACT inhaler Inhale 2 puffs into the lungs every 4 (four) hours as needed for wheezing or shortness of breath.   amitriptyline (ELAVIL) 50 MG tablet TAKE 1 TABLET BY MOUTH AT BEDTIME   buPROPion (WELLBUTRIN SR) 150 MG 12 hr tablet Take 1 tablet by mouth twice daily   busPIRone (BUSPAR) 7.5 MG tablet Take 1 tablet by mouth twice daily   Carboxymethylcellul-Glycerin (LUBRICATING EYE DROPS OP) Place 1 drop into both eyes daily as needed (dry eyes).   Cholecalciferol (DIALYVITE VITAMIN D 5000) 125 MCG (5000 UT) capsule Take 10,000 Units by mouth daily.   fluticasone (FLONASE) 50 MCG/ACT nasal spray Place 2 sprays into both nostrils daily. (Patient taking differently: Place 2 sprays into both nostrils daily as needed for allergies.)   fluticasone furoate-vilanterol (BREO ELLIPTA) 200-25 MCG/INH AEPB Inhale 1 puff into the lungs daily. (Patient taking differently: Inhale 1 puff into the lungs daily as needed (shortness of breath).)   gabapentin (NEURONTIN) 300 MG capsule Take 600 mg by mouth 3 (three) times daily.   Galcanezumab-gnlm (EMGALITY) 120 MG/ML SOAJ INJECT 120 MG SUBCUTANEOUSLY EVERY 30 DAYS (Patient taking differently: Inject 120 mg into the skin every 30 (thirty) days. INJECT 120 MG SUBCUTANEOUSLY EVERY 30 DAYS)   ipratropium (ATROVENT) 0.03 % nasal spray Place 2 sprays into both nostrils every 12 (twelve) hours.   lidocaine (LIDODERM) 5 % Place 1 patch onto the skin daily. Remove & Discard patch within 12 hours or as directed by MD  (Patient not taking: Reported on 11/10/2021)   meclizine (ANTIVERT) 25 MG tablet TAKE 1 TABLET BY MOUTH THREE TIMES DAILY AS NEEDED FOR DIZZINESS (Patient not taking: Reported on 11/10/2021)   methocarbamol (ROBAXIN) 750 MG tablet Take 750 mg by mouth 3 (three) times daily as needed.   neomycin-polymyxin-hydrocortisone (CORTISPORIN) OTIC solution Place 3 drops into the right ear 4 (four) times daily.   omeprazole (PRILOSEC) 20 MG capsule Take 1 capsule by mouth once daily   oxyCODONE-acetaminophen (PERCOCET) 10-325 MG tablet Take 1 tablet by mouth every 4 (four) hours as needed for pain.   oxymorphone (OPANA ER) 15 MG 12 hr tablet Take 15 mg by mouth 2 (two) times daily as needed for pain.   predniSONE (STERAPRED UNI-PAK 21 TAB) 10 MG (21) TBPK tablet Directions for 6 day taper: Day 1: 2 tablets before breakfast, 1 after both lunch & dinner and 2 at bedtime Day 2: 1 tab before breakfast, 1 after both lunch & dinner and 2 at bedtime Day 3: 1 tab at each meal & 1 at bedtime Day 4: 1 tab at breakfast, 1 at lunch, 1 at bedtime Day 5: 1 tab at breakfast & 1 tab at bedtime Day 6: 1 tab at breakfast   rizatriptan (MAXALT-MLT) 10 MG disintegrating tablet Take 1 tablet (10 mg total) by mouth as needed for migraine. May repeat in 2 hours if needed (Patient not taking: Reported on 11/10/2021)   rosuvastatin (CRESTOR) 5 MG tablet Take 1 tablet by mouth once  daily   varenicline (CHANTIX CONTINUING MONTH PAK) 1 MG tablet Take 1 tablet (1 mg total) by mouth 2 (two) times daily. From second week.   No facility-administered medications prior to visit.    Review of Systems  {Labs  Heme  Chem  Endocrine  Serology  Results Review (optional):23779}   Objective    LMP 04/11/2012  {Show previous vital signs (optional):23777}  Physical Exam  ***  No results found for any visits on 02/18/22.  Assessment & Plan     ***  No follow-ups on file.      {provider attestation***:1}   [name], [place] [phone] (phone) [phone] (fax)  [place]ine

## 2022-02-17 DIAGNOSIS — J9691 Respiratory failure, unspecified with hypoxia: Secondary | ICD-10-CM | POA: Diagnosis not present

## 2022-02-17 DIAGNOSIS — J84116 Cryptogenic organizing pneumonia: Secondary | ICD-10-CM | POA: Diagnosis not present

## 2022-02-18 ENCOUNTER — Ambulatory Visit: Payer: Medicaid Other | Admitting: Family Medicine
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
## 2022-02-19 ENCOUNTER — Telehealth: Payer: Self-pay | Admitting: Adult Health

## 2022-02-19 NOTE — Telephone Encounter (Signed)
Spoke with PCCs and they were needing to have HST order placed to get it processed. Order has been placed.

## 2022-02-19 NOTE — Telephone Encounter (Signed)
That is fine if you want to send a letter that says we are currently working up for possible underlying sleep apnea.  Has a home sleep study been ordered?

## 2022-02-19 NOTE — Telephone Encounter (Signed)
Home sleep study was ordered but then a message was received from [place], [facility] that due to a medication pt was on, pt would not be able to have a HST performed and would have to have an in lab study. Raven found this out when trying to process the order.  She said that they were able to get the in lab study scheduled so the HST order was then cancelled. Nothing further needed.

## 2022-02-20 ENCOUNTER — Encounter (HOSPITAL_BASED_OUTPATIENT_CLINIC_OR_DEPARTMENT_OTHER): Payer: Medicaid Other | Admitting: Pulmonary Disease

## 2022-02-20 IMAGING — DX DG CHEST 1V PORT
1 series · 1 of 1 positions shown · non-contrast
Comparison: May 27, 2020

CLINICAL DATA: Shortness of breath and fatigue.

EXAM:
PORTABLE CHEST 1 VIEW

[chest ap]
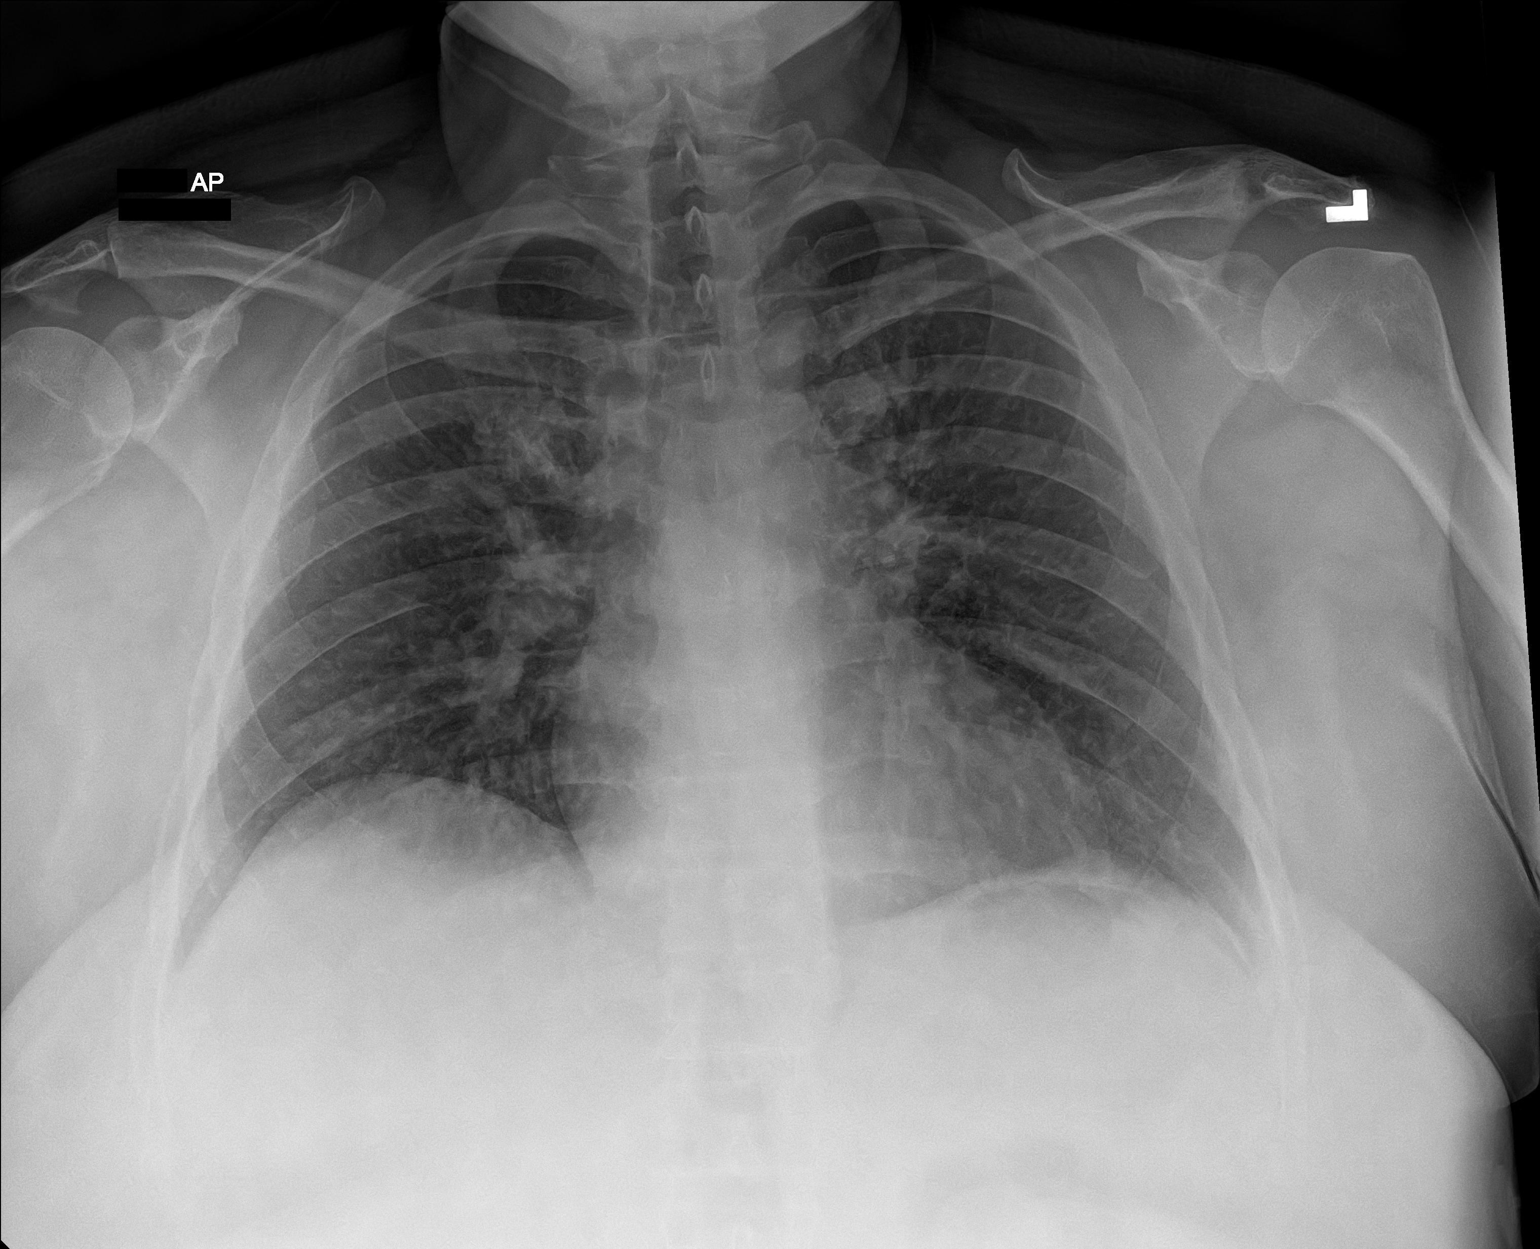

[1 of 1 positions shown; findings below may reference images not displayed]

FINDINGS: Decreased lung volumes are seen which is likely secondary to the
degree of patient inspiration. Mild pulmonary vascular crowding is
seen without evidence of focal consolidation, pleural effusion or
pneumothorax. The heart size and mediastinal contours are within
normal limits. The visualized skeletal structures are unremarkable.
IMPRESSION: Low lung volumes without acute or active cardiopulmonary disease.

## 2022-02-21 IMAGING — CT CT ANGIO CHEST
2 of 6 series · 19 of 46 positions shown · IV contrast (Omnipaque or Isovue)
Comparison: May 27, 2020

CLINICAL DATA: Worsening shortness of breath.

EXAM:
CT ANGIOGRAPHY CHEST WITH CONTRAST
TECHNIQUE: Multidetector CT imaging of the chest was performed using the
standard protocol during bolus administration of intravenous
contrast. Multiplanar CT image reconstructions and MIPs were
obtained to evaluate the vascular anatomy.
CONTRAST:  100mL OMNIPAQUE IOHEXOL 350 MG/ML SOLN

[Series 5: pe axial thins · axial · 0.70mm/px · z∈[+945,+1213]mm · 16 of 294 slices shown]
[im 13/294  lung]
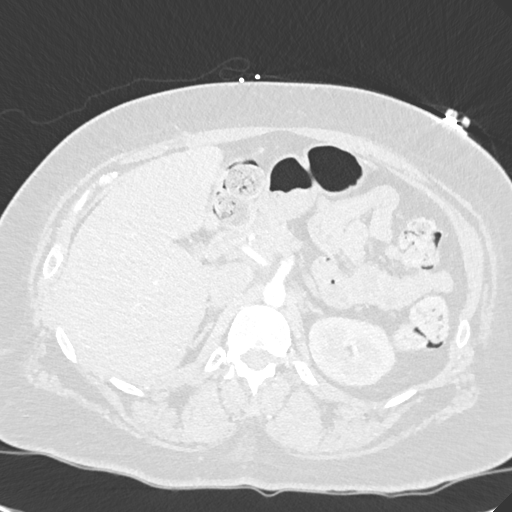
[im 39/294  soft-tissue]
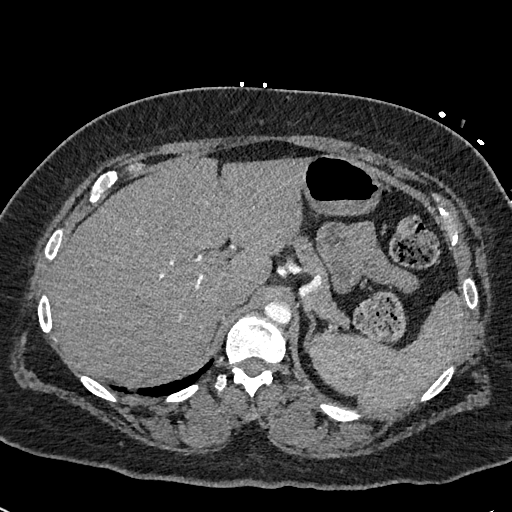
[im 51/294  lung]
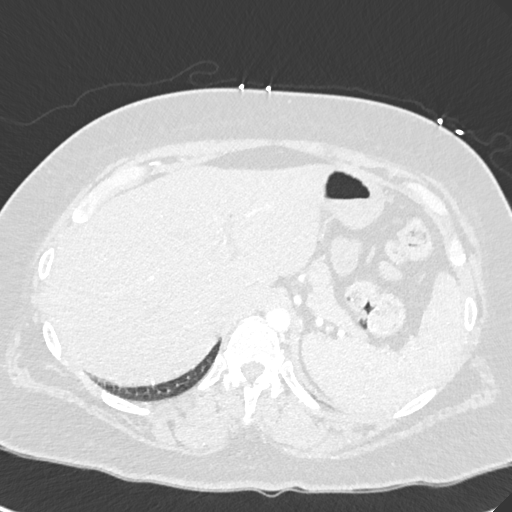
[im 64/294  soft-tissue]
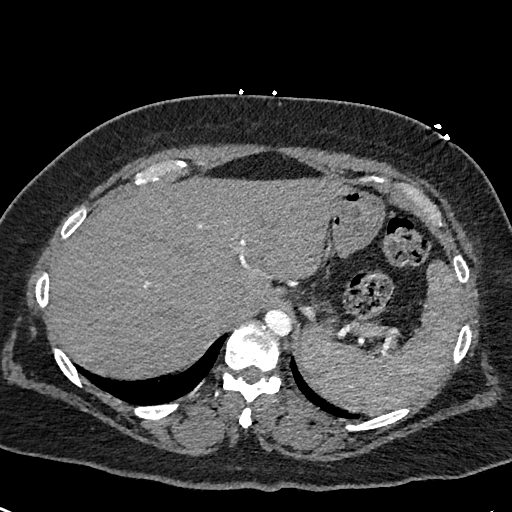
[im 90/294  lung]
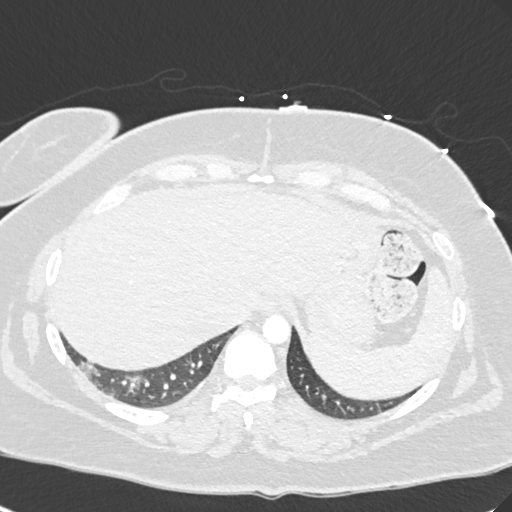
[im 102/294  soft-tissue]
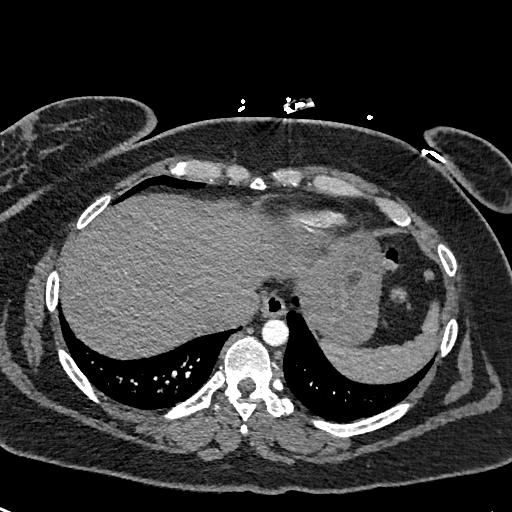
[im 115/294  lung]
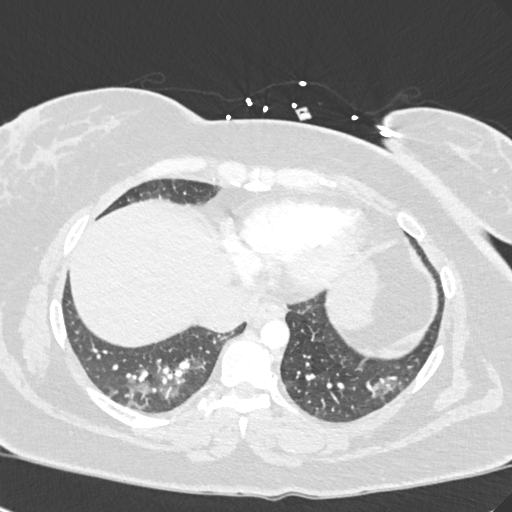
[im 141/294  soft-tissue]
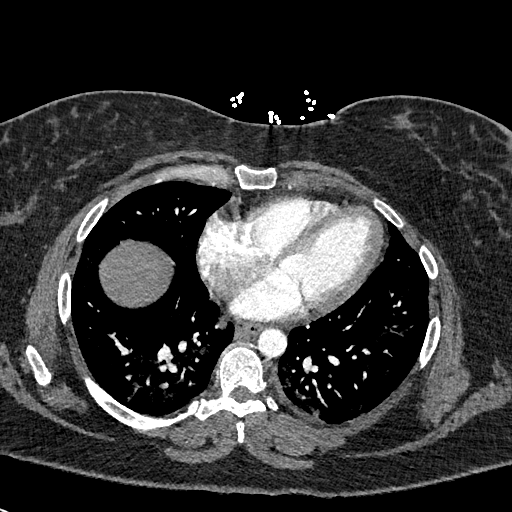
[im 153/294  lung]
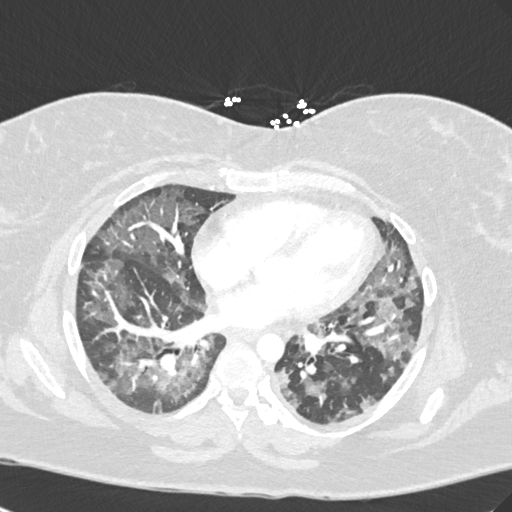
[im 179/294  soft-tissue]
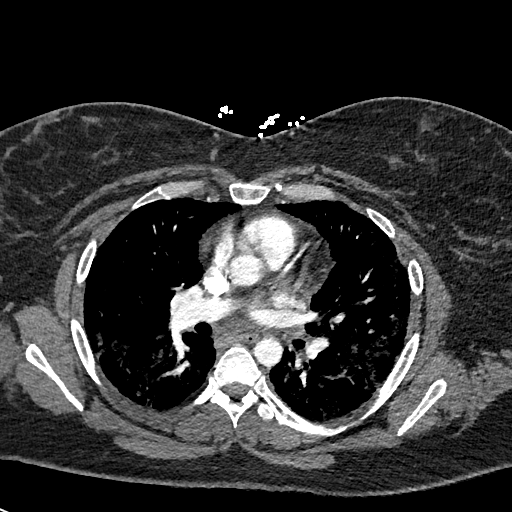
[im 192/294  lung]
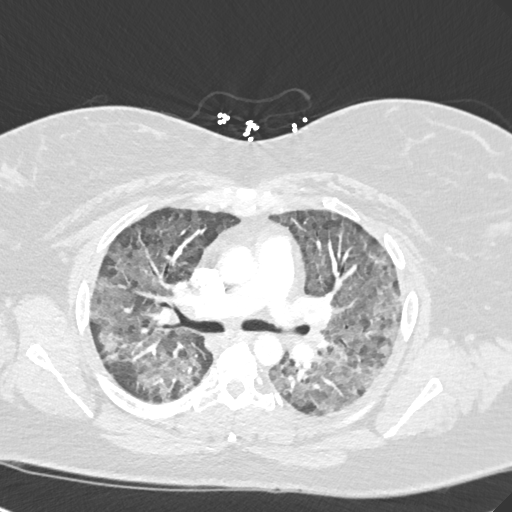
[im 204/294  soft-tissue]
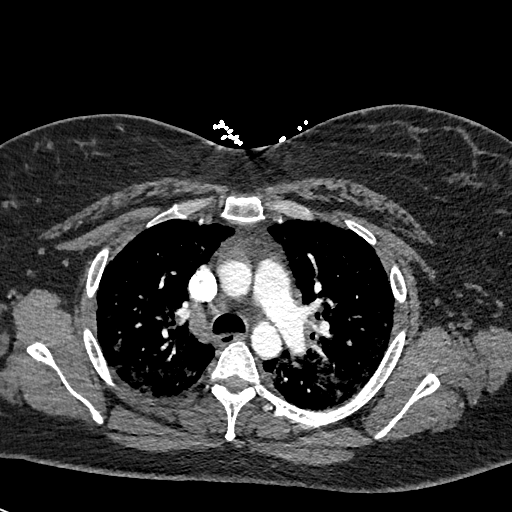
[im 230/294  lung]
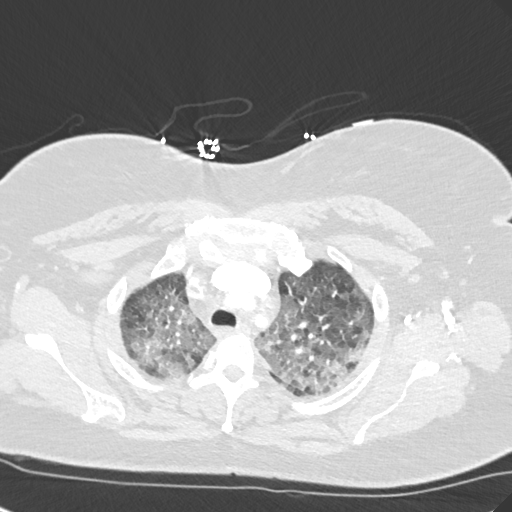
[im 243/294  soft-tissue]
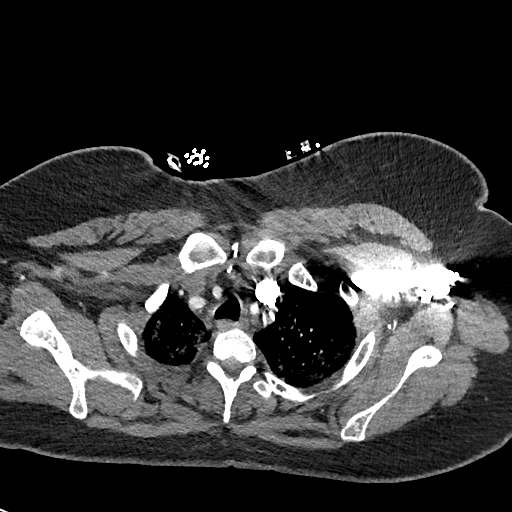
[im 255/294  lung]
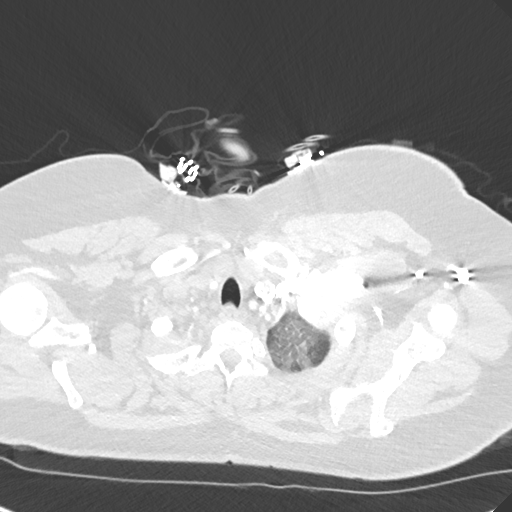
[im 281/294  soft-tissue]
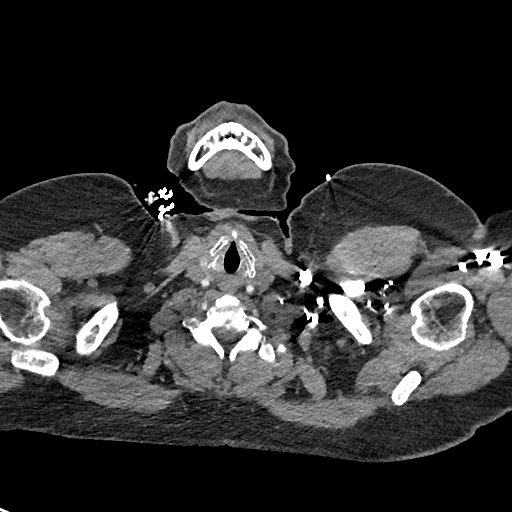

[Series 7: cor soft · coronal · 0.58mm/px · 3 of 132 slices shown]
[im 33/132  soft-tissue]
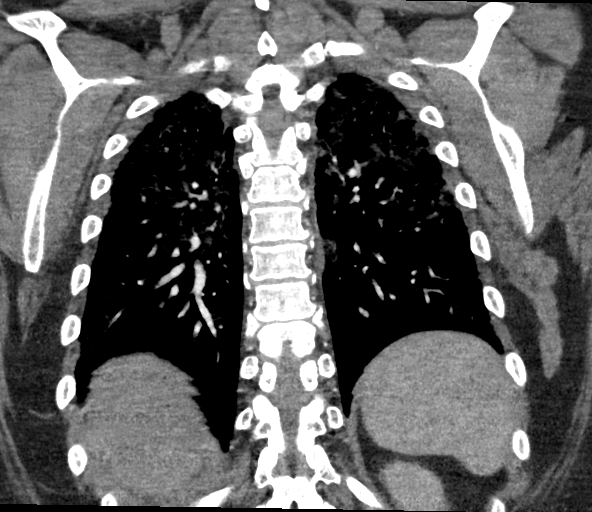
[im 66/132  soft-tissue]
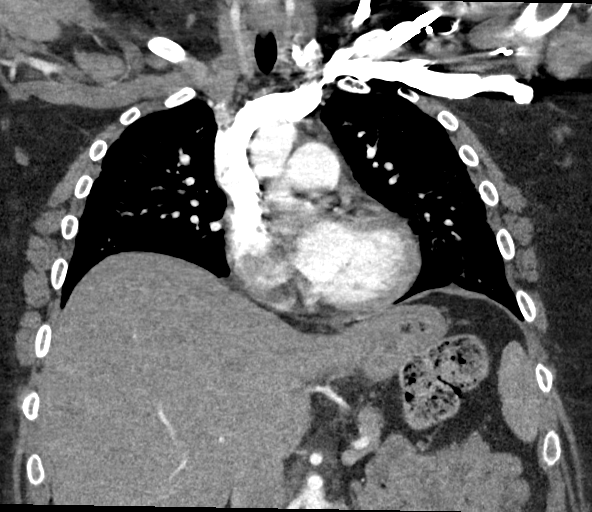
[im 99/132  soft-tissue]
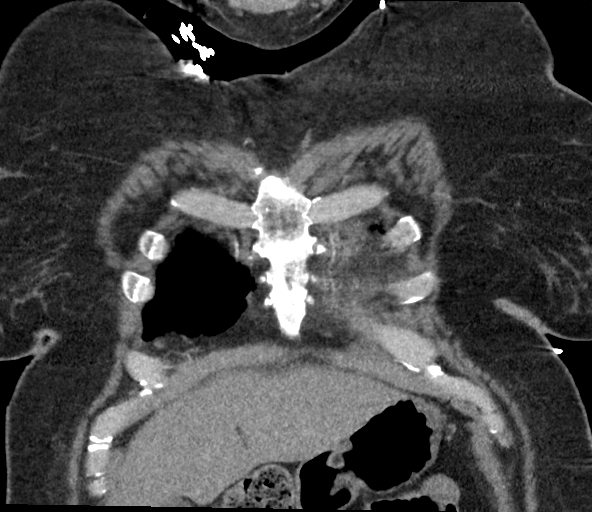

[19 of 46 positions shown; findings below may reference images not displayed]

FINDINGS: Cardiovascular: Satisfactory opacification of the pulmonary arteries
to the segmental level. No evidence of pulmonary embolism. Normal
heart size. No pericardial effusion.

Mediastinum/Nodes: There is mild pretracheal and AP window
lymphadenopathy. Thyroid gland, trachea, and esophagus demonstrate
no significant findings.

Lungs/Pleura: Marked severity diffuse, predominantly ground-glass
appearing infiltrates are seen throughout both lungs.

There is no evidence of a pleural effusion or pneumothorax.

Upper Abdomen: Ill-defined gallstones are seen within the
gallbladder lumen.

Musculoskeletal: No chest wall abnormality. No acute or significant
osseous findings.

Review of the MIP images confirms the above findings.
IMPRESSION: 1. Marked severity diffuse bilateral infiltrates.
2. Cholelithiasis.

## 2022-02-24 ENCOUNTER — Ambulatory Visit: Payer: Medicaid Other | Admitting: Internal Medicine

## 2022-02-26 ENCOUNTER — Other Ambulatory Visit: Payer: Self-pay | Admitting: Internal Medicine

## 2022-02-26 DIAGNOSIS — G43809 Other migraine, not intractable, without status migrainosus: Secondary | ICD-10-CM

## 2022-02-26 DIAGNOSIS — F419 Anxiety disorder, unspecified: Secondary | ICD-10-CM

## 2022-02-27 DIAGNOSIS — G8929 Other chronic pain: Secondary | ICD-10-CM | POA: Diagnosis not present

## 2022-02-27 DIAGNOSIS — M5416 Radiculopathy, lumbar region: Secondary | ICD-10-CM | POA: Diagnosis not present

## 2022-03-05 ENCOUNTER — Other Ambulatory Visit: Payer: Self-pay | Admitting: Internal Medicine

## 2022-03-08 ENCOUNTER — Telehealth: Payer: Medicaid Other | Admitting: Nurse Practitioner

## 2022-03-08 DIAGNOSIS — N390 Urinary tract infection, site not specified: Secondary | ICD-10-CM | POA: Diagnosis not present

## 2022-03-08 MED ORDER — NITROFURANTOIN MONOHYD MACRO 100 MG PO CAPS: 100.0000 mg | ORAL_CAPSULE | Freq: Two times a day (BID) | ORAL | 0 refills | Status: AC

## 2022-03-08 NOTE — Progress Notes (Signed)

## 2022-03-08 NOTE — Progress Notes (Signed)
I have spent 5 minutes in review of e-visit questionnaire, review and updating patient chart, medical decision making and response to patient.  ° °Ilene Witcher W Sarracino, NP ° °  °

## 2022-03-14 ENCOUNTER — Other Ambulatory Visit: Payer: Self-pay | Admitting: Internal Medicine

## 2022-03-14 ENCOUNTER — Other Ambulatory Visit: Payer: Self-pay | Admitting: Adult Health

## 2022-03-14 DIAGNOSIS — E7841 Elevated Lipoprotein(a): Secondary | ICD-10-CM

## 2022-03-16 ENCOUNTER — Telehealth: Payer: Medicaid Other | Admitting: Physician Assistant

## 2022-03-16 DIAGNOSIS — K219 Gastro-esophageal reflux disease without esophagitis: Secondary | ICD-10-CM | POA: Diagnosis not present

## 2022-03-16 MED ORDER — OMEPRAZOLE 40 MG PO CPDR: 40.0000 mg | DELAYED_RELEASE_CAPSULE | Freq: Every day | ORAL | 0 refills | Status: DC

## 2022-03-16 NOTE — Progress Notes (Signed)
E-Visit for Heartburn  We are sorry that you are not feeling well.  Here is how we plan to help!  Based on what you shared with me it looks like you most likely have Gastroesophageal Reflux Disease (GERD)  Gastroesophageal reflux disease (GERD) happens when acid from your stomach flows up into the esophagus.  When acid comes in contact with the esophagus, the acid causes sorenss (inflammation) in the esophagus.  Over time, GERD may create small holes (ulcers) in the lining of the esophagus.  I have prescribed Omeprazole 40 mg one by mouth daily for 14 days.  Your symptoms should improve in the next day or two.  You can use antacids as needed until symptoms resolve.  Call us if your heartburn worsens, you have trouble swallowing, weight loss, spitting up blood or recurrent vomiting.  Home Care: May include lifestyle changes such as weight loss, quitting smoking and alcohol consumption Avoid foods and drinks that make your symptoms worse, such as: Caffeine or alcoholic drinks Chocolate Peppermint or mint flavorings Garlic and onions Spicy foods Citrus fruits, such as oranges, lemons, or limes Tomato-based foods such as sauce, chili, salsa and pizza Fried and fatty foods Avoid lying down for 3 hours prior to your bedtime or prior to taking a nap Eat small, frequent meals instead of a large meals Wear loose-fitting clothing.  Do not wear anything tight around your waist that causes pressure on your stomach. Raise the head of your bed 6 to 8 inches with wood blocks to help you sleep.  Extra pillows will not help.  Seek Help Right Away If: You have pain in your arms, neck, jaw, teeth or back Your pain increases or changes in intensity or duration You develop nausea, vomiting or sweating (diaphoresis) You develop shortness of breath or you faint Your vomit is green, yellow, black or looks like coffee grounds or blood Your stool is red, bloody or black  These symptoms could be signs of  other problems, such as heart disease, gastric bleeding or esophageal bleeding.  Make sure you : Understand these instructions. Will watch your condition. Will get help right away if you are not doing well or get worse.  Your e-visit answers were reviewed by a board certified advanced clinical practitioner to complete your personal care plan.  Depending on the condition, your plan could have included both over the counter or prescription medications.  If there is a problem please reply  once you have received a response from your provider.  Your safety is important to [place].  If you have drug allergies check your prescription carefully.    You can use MyChart to ask questions about today's visit, request a non-urgent call back, or ask for a work or school excuse for 24 hours related to this e-Visit. If it has been greater than 24 hours you will need to follow up with your provider, or enter a new e-Visit to address those concerns.  You will get an e-mail in the next two days asking about your experience.  I hope that your e-visit has been valuable and will speed your recovery. Thank you for using e-visits.  I provided 5 minutes of non face-to-face time during this encounter for chart review and documentation.

## 2022-03-20 ENCOUNTER — Ambulatory Visit: Payer: Self-pay | Admitting: Family Medicine

## 2022-03-20 DIAGNOSIS — J9691 Respiratory failure, unspecified with hypoxia: Secondary | ICD-10-CM | POA: Diagnosis not present

## 2022-03-20 DIAGNOSIS — J84116 Cryptogenic organizing pneumonia: Secondary | ICD-10-CM | POA: Diagnosis not present

## 2022-03-25 ENCOUNTER — Other Ambulatory Visit: Payer: Self-pay | Admitting: Internal Medicine

## 2022-03-25 DIAGNOSIS — E7841 Elevated Lipoprotein(a): Secondary | ICD-10-CM

## 2022-03-31 ENCOUNTER — Telehealth: Payer: Medicaid Other | Admitting: Physician Assistant

## 2022-03-31 DIAGNOSIS — K219 Gastro-esophageal reflux disease without esophagitis: Secondary | ICD-10-CM

## 2022-03-31 NOTE — Progress Notes (Signed)
Because of ongoing symptoms and prior evaluation that was to start trial of medication until you could follow-up with a provider in person, I feel your condition warrants further evaluation and I recommend that you be seen in a face to face visit.   NOTE: There will be NO CHARGE for this eVisit   If you are having a true medical emergency please call 911.      For an urgent face to face visit, [facility] has seven urgent care centers for your convenience:     [facility] [place] Get Driving Directions [phone] [address] [place], [place] [address]    [facility]) Get Driving Directions [phone] [place], [place] [address]  [facility] [place] ([place]) Get Driving Directions [phone] [address] [place] [employer],  [place]  [address]  [facility] - at [address] Get Driving Directions  [phone] [address] [place],  [place] [address]   [facility] Get Driving Directions [phone] [address], [place], [place] [address]   [facility] Get Driving Directions  [phone] [address] [place], [place] [address]   [facility] [place] Get Driving Directions [phone] [address], [place], [place] [address]  Your MyChart E-visit questionnaire answers were reviewed by a board certified advanced clinical practitioner to complete your personal care plan based on your specific symptoms.  Thank you for using e-Visits.

## 2022-04-01 DIAGNOSIS — M545 Low back pain, unspecified: Secondary | ICD-10-CM | POA: Diagnosis not present

## 2022-04-02 ENCOUNTER — Encounter: Payer: Self-pay | Admitting: Internal Medicine

## 2022-04-03 ENCOUNTER — Encounter: Payer: Self-pay | Admitting: Family Medicine

## 2022-04-03 ENCOUNTER — Other Ambulatory Visit: Payer: Self-pay | Admitting: Internal Medicine

## 2022-04-03 ENCOUNTER — Telehealth: Payer: Self-pay

## 2022-04-03 DIAGNOSIS — K219 Gastro-esophageal reflux disease without esophagitis: Secondary | ICD-10-CM

## 2022-04-03 MED ORDER — OMEPRAZOLE 40 MG PO CPDR: 40.0000 mg | DELAYED_RELEASE_CAPSULE | Freq: Every day | ORAL | 1 refills | Status: DC

## 2022-04-03 NOTE — Telephone Encounter (Signed)
I called patient to let her know she is no longer a patient at [facility].  She requested to move to another office and we removed PCP from her [facility] file.  She is no longer a patient at [facility].

## 2022-04-07 ENCOUNTER — Other Ambulatory Visit: Payer: Self-pay | Admitting: Internal Medicine

## 2022-04-07 DIAGNOSIS — G43809 Other migraine, not intractable, without status migrainosus: Secondary | ICD-10-CM

## 2022-04-07 DIAGNOSIS — F419 Anxiety disorder, unspecified: Secondary | ICD-10-CM

## 2022-04-09 NOTE — Telephone Encounter (Signed)
Called patient to let her know she needs an office visit before having meds filled. LMTCB. Okay for PEC to advise.

## 2022-04-13 NOTE — Telephone Encounter (Signed)
Will close encounter

## 2022-04-19 DIAGNOSIS — J9691 Respiratory failure, unspecified with hypoxia: Secondary | ICD-10-CM | POA: Diagnosis not present

## 2022-04-19 DIAGNOSIS — J84116 Cryptogenic organizing pneumonia: Secondary | ICD-10-CM | POA: Diagnosis not present

## 2022-04-24 ENCOUNTER — Ambulatory Visit: Payer: Medicaid Other | Attending: Adult Health | Admitting: Pulmonary Disease

## 2022-04-24 ENCOUNTER — Telehealth: Payer: Medicaid Other | Admitting: Family Medicine

## 2022-04-24 DIAGNOSIS — R0683 Snoring: Secondary | ICD-10-CM | POA: Insufficient documentation

## 2022-04-24 DIAGNOSIS — R399 Unspecified symptoms and signs involving the genitourinary system: Secondary | ICD-10-CM

## 2022-04-24 DIAGNOSIS — G4719 Other hypersomnia: Secondary | ICD-10-CM | POA: Insufficient documentation

## 2022-04-24 DIAGNOSIS — G4733 Obstructive sleep apnea (adult) (pediatric): Secondary | ICD-10-CM

## 2022-04-24 MED ORDER — CEPHALEXIN 500 MG PO CAPS: 500.0000 mg | ORAL_CAPSULE | Freq: Two times a day (BID) | ORAL | 0 refills | Status: AC

## 2022-04-24 NOTE — Progress Notes (Signed)

## 2022-05-07 ENCOUNTER — Telehealth: Payer: Self-pay | Admitting: Adult Health

## 2022-05-07 DIAGNOSIS — G4733 Obstructive sleep apnea (adult) (pediatric): Secondary | ICD-10-CM | POA: Diagnosis not present

## 2022-05-07 NOTE — Telephone Encounter (Signed)
NPSG October 2023 showed Mild obstructive sleep apnea occurred during this study (AHI = 7.3/h). - No significant central sleep apnea occurred during this study (CAI = 4.7/h). - Mild oxygen desaturation was noted during this study (Min O2 = 85.00%).  Please set off  up an office visit or virtual visit to discuss sleep study results and go over treatment plan

## 2022-05-07 NOTE — Procedures (Signed)
Patient Name: April Ayala, April Ayala Date: 04/24/2022 Gender: Female D.O.B: [birth year] Age (years): 46 Referring Provider: Tammy Parrett Height (inches): 60 Interpreting Physician: Kara Mead MD, ABSM Weight (lbs): 229 RPSGT: Peak, Robert BMI: 45 MRN: [id number] Neck Size: <br> <br> <br> CLINICAL INFORMATION Sleep Study Type: NPSG    Indication for sleep study: sleepiness, snoring    Epworth Sleepiness Score: 10    SLEEP STUDY TECHNIQUE As per the AASM Manual for the Scoring of Sleep and Associated Events v2.3 (April 2016) with a hypopnea requiring 4% desaturations.  The channels recorded and monitored were frontal, central and occipital EEG, electrooculogram (EOG), submentalis EMG (chin), nasal and oral airflow, thoracic and abdominal wall motion, anterior tibialis EMG, snore microphone, electrocardiogram, and pulse oximetry.  MEDICATIONS Medications self-administered by patient taken the night of the study : N/A  SLEEP ARCHITECTURE The study was initiated at 9:36:22 PM and ended at 5:22:21 AM.  Sleep onset time was 29.9 minutes and the sleep efficiency was 88.3%. The total sleep time was 411.5 minutes.  Stage REM latency was 53.5 minutes.  The patient spent 0.97% of the night in stage N1 sleep, 68.53% in stage N2 sleep, 3.52% in stage N3 and 27% in REM.  Alpha intrusion was absent.  Supine sleep was 0.00%.  RESPIRATORY PARAMETERS The overall apnea/hypopnea index (AHI) was 7.3 per hour. There were 32 total apneas, including 0 obstructive, 32 central and 0 mixed apneas. There were 18 hypopneas and 1 RERAs.  The AHI during Stage REM sleep was 6.5 per hour.  AHI while supine was N/A per hour.  The mean oxygen saturation was 95.00%. The minimum SpO2 during sleep was 85.00%.  moderate snoring was noted during this study.  CARDIAC DATA The 2 lead EKG demonstrated sinus rhythm. The mean heart rate was 64.38 beats per minute. Other EKG findings include:  None.  LEG MOVEMENT DATA The total PLMS were 0 with a resulting PLMS index of 0.00. Associated arousal with leg movement index was 0.0 .  IMPRESSIONS - Mild obstructive sleep apnea occurred during this study (AHI = 7.3/h). - No significant central sleep apnea occurred during this study (CAI = 4.7/h). - Mild oxygen desaturation was noted during this study (Min O2 = 85.00%). - The patient snored with moderate snoring volume. - No cardiac abnormalities were noted during this study. - Clinically significant periodic limb movements did not occur during sleep. No significant associated arousals.  DIAGNOSIS - Mild Obstructive Sleep Apnea (G47.33)   RECOMMENDATIONS - Very mild obstructive sleep apnea. Return to discuss treatment options. Investigate other causes of somnolence - Avoid alcohol, sedatives and other CNS depressants that may worsen sleep apnea and disrupt normal sleep architecture. - Sleep hygiene should be reviewed to assess factors that may improve sleep quality. - Weight management and regular exercise should be initiated or continued if appropriate.    Kara Mead MD Board Certified in [place]

## 2022-05-08 ENCOUNTER — Other Ambulatory Visit: Payer: Self-pay | Admitting: Adult Health

## 2022-05-08 DIAGNOSIS — G43709 Chronic migraine without aura, not intractable, without status migrainosus: Secondary | ICD-10-CM

## 2022-05-08 NOTE — Telephone Encounter (Signed)
Called patient but she did not answer. Left message for her to call back.  

## 2022-05-11 NOTE — Telephone Encounter (Signed)
ATC x2.  LVM to return call.

## 2022-05-14 ENCOUNTER — Encounter: Payer: Self-pay | Admitting: *Deleted

## 2022-05-14 NOTE — Telephone Encounter (Signed)
ATC x3.  LVM to return call.  Unable to reach letter sent.

## 2022-05-20 DIAGNOSIS — J84116 Cryptogenic organizing pneumonia: Secondary | ICD-10-CM | POA: Diagnosis not present

## 2022-05-20 DIAGNOSIS — J9691 Respiratory failure, unspecified with hypoxia: Secondary | ICD-10-CM | POA: Diagnosis not present

## 2022-06-01 ENCOUNTER — Other Ambulatory Visit: Payer: Self-pay | Admitting: Internal Medicine

## 2022-06-01 DIAGNOSIS — E7841 Elevated Lipoprotein(a): Secondary | ICD-10-CM

## 2022-06-09 NOTE — Progress Notes (Deleted)
New patient visit   Patient: April Ayala   DOB: [birth year]   46 y.o. Female  MRN: [id number] Visit Date: 06/10/2022  Today's healthcare provider: [name], FNP   No chief complaint on file.  Subjective    April Ayala is a 46 y.o. female who presents today as a new patient to establish care.  HPI  ***  Past Medical History:  Diagnosis Date   Angular cheilitis 07/17/2020   Anxiety    BOOP (bronchiolitis obliterans with organizing pneumonia) ([place]) 07/09/2020   Cervical cancer ([place]) 04/2011   Stage 1B squamous cell   Chronic left shoulder pain 10/07/2017   Chronic tension-type headache, not intractable 10/07/2017   Depression    High cholesterol    History of kidney stones    passed   Morbid obesity with BMI of 40.0-44.9, adult ([place]) 09/29/2019   Prehypertension 06/26/2020   S/P lumbar fusion 04/20/2018   Past Surgical History:  Procedure Laterality Date   ABDOMINAL HYSTERECTOMY     BREAST BIOPSY Right    CERVICAL CONIZATION W/BX  03/24/2012   Procedure: CONIZATION CERVIX WITH BIOPSY;  Surgeon: Woodroe Mode, MD;  Location: [facility];  Service: Gynecology;  Laterality: N/A;   DIAGNOSTIC LAPAROSCOPY  2007   ectopic   ECTOPIC PREGNANCY SURGERY     ESSURE TUBAL LIGATION  2010   INSERTION OF SUPRAPUBIC CATHETER  05/03/2012   Procedure: INSERTION OF SUPRAPUBIC CATHETER;  Surgeon: Alvino Chapel, MD;  Location: [facility];  Service: Gynecology;;   LEEP     LUMBAR LAMINECTOMY/DECOMPRESSION MICRODISCECTOMY N/A 04/20/2018   Procedure: LUMBAR FIVE TO SACRAL ONE DECOMPRESSION AND FUSION, POSSIBLE LUMBAR FOUR TO SACRAL ONE; SCREW AND CAGE INSTRUMENTATION;  Surgeon: Melina Schools, MD;  Location: [facility];  Service: Orthopedics;  Laterality: N/A;  5 hrs   LYMPHADENECTOMY  05/03/2012   Procedure: LYMPHADENECTOMY;  Surgeon: Alvino Chapel, MD;  Location: [facility];  Service: Gynecology;  Laterality: Bilateral;  pelvic   RADICAL HYSTERECTOMY  05/03/2012    Procedure: RADICAL HYSTERECTOMY;  Surgeon: Alvino Chapel, MD;  Location: [facility];  Service: Gynecology;;  transposition of the ovaries   SPINE SURGERY     TUBAL LIGATION     Family Status  Relation Name Status   Mother  Deceased   Father  Alive   Sister  Alive   Sister  Alive   Daughter  Alive   Son  [employer]   Daughter  Alive   Daughter  Alive   Sister  Alive   Family History  Problem Relation Age of Onset   Diabetes Mother    Hypertension Father    Diabetes Sister    Migraines Sister    Hypothyroidism Sister    Migraines Sister    Migraines Sister    Social History   Socioeconomic History   Marital status: Significant Other    Spouse name: Not on file   Number of children: 4   Years of education: Not on file   Highest education level: 12th grade  Occupational History   Occupation: Paediatric nurse  Tobacco Use   Smoking status: Former    Packs/day: 0.50    Years: 20.00    Total pack years: 10.00    Types: Cigarettes    Start date: 11/03/2018    Quit date: 07/11/2021    Years since quitting: 0.9   Smokeless tobacco: Never  Vaping Use   Vaping Use: Never used  Substance and Sexual Activity  Alcohol use: Never   Drug use: Never   Sexual activity: Yes    Birth control/protection: Surgical    Comment: hyst  Other Topics Concern   Not on file  Social History Narrative   Lives with fiance and 1 daughter      1 son is with grandmother    Oldest daughter is 29 lives with grandfather      Works as a Aeronautical engineer at [employer].     Lives in [place], [place].      6 dogs, a  beard dragon, a snake, and a hamster and a cat      Enjoys: taking care of her pets      Diet: avoids lunch due to work, eats all food groups   Caffeine: monsters in the [employer]: limited      Wears seat belt    Does not use phone while driving    [place] at home       Right handed   Social Determinants of Health   Financial Resource Strain: Low Risk   (03/05/2021)   Overall Financial Resource Strain (CARDIA)    Difficulty of Paying Living Expenses: Not hard at all  Food Insecurity: No Food Insecurity (03/05/2021)   Hunger Vital Sign    Worried About Running Out of Food in the Last Year: Never true    [place] in the Last Year: Never true  Transportation Needs: No Transportation Needs (03/05/2021)   PRAPARE - Hydrologist (Medical): No    Lack of Transportation (Non-Medical): No  Physical Activity: Inactive (03/05/2021)   Exercise Vital Sign    Days of Exercise per Week: 0 days    Minutes of Exercise per Session: 0 min  Stress: No Stress Concern Present (03/05/2021)   [place]    Feeling of Stress : Only a little  Social Connections: Socially Isolated (03/05/2021)   Social Connection and Isolation Panel [NHANES]    Frequency of Communication with Friends and Family: Once a week    Frequency of Social Gatherings with Friends and Family: Once a week    Attends Religious Services: 1 to 4 times per year    Active Member of [employer] or Organizations: No    Attends Archivist Meetings: Never    Marital Status: Divorced   Outpatient Medications Prior to Visit  Medication Sig   acyclovir cream (ZOVIRAX) 5 % Apply 1 Application topically 5 (five) times daily. For 5-7 days   albuterol (VENTOLIN HFA) 108 (90 Base) MCG/ACT inhaler Inhale 2 puffs into the lungs every 4 (four) hours as needed for wheezing or shortness of breath.   amitriptyline (ELAVIL) 50 MG tablet TAKE 1 TABLET BY MOUTH AT BEDTIME   buPROPion (WELLBUTRIN SR) 150 MG 12 hr tablet Take 1 tablet by mouth twice daily   busPIRone (BUSPAR) 7.5 MG tablet Take 1 tablet by mouth twice daily   Carboxymethylcellul-Glycerin (LUBRICATING EYE DROPS OP) Place 1 drop into both eyes daily as needed (dry eyes).   Cholecalciferol (DIALYVITE VITAMIN D 5000) 125 MCG (5000 UT) capsule Take  10,000 Units by mouth daily.   fluticasone (FLONASE) 50 MCG/ACT nasal spray Place 2 sprays into both nostrils daily. (Patient taking differently: Place 2 sprays into both nostrils daily as needed for allergies.)   fluticasone furoate-vilanterol (BREO ELLIPTA) 200-25 MCG/INH AEPB Inhale 1 puff into the lungs daily. (Patient taking differently: Inhale  1 puff into the lungs daily as needed (shortness of breath).)   gabapentin (NEURONTIN) 300 MG capsule Take 600 mg by mouth 3 (three) times daily.   Galcanezumab-gnlm (EMGALITY) 120 MG/ML SOAJ INJECT 120 MG SUBCUTANEOUSLY EVERY 30 DAYS (Patient taking differently: Inject 120 mg into the skin every 30 (thirty) days. INJECT 120 MG SUBCUTANEOUSLY EVERY 30 DAYS)   ipratropium (ATROVENT) 0.03 % nasal spray Place 2 sprays into both nostrils every 12 (twelve) hours.   lidocaine (LIDODERM) 5 % Place 1 patch onto the skin daily. Remove & Discard patch within 12 hours or as directed by MD (Patient not taking: Reported on 11/10/2021)   meclizine (ANTIVERT) 25 MG tablet TAKE 1 TABLET BY MOUTH THREE TIMES DAILY AS NEEDED FOR DIZZINESS (Patient not taking: Reported on 11/10/2021)   methocarbamol (ROBAXIN) 750 MG tablet Take 750 mg by mouth 3 (three) times daily as needed.   neomycin-polymyxin-hydrocortisone (CORTISPORIN) OTIC solution Place 3 drops into the right ear 4 (four) times daily.   omeprazole (PRILOSEC) 40 MG capsule Take 1 capsule (40 mg total) by mouth daily.   oxyCODONE-acetaminophen (PERCOCET) 10-325 MG tablet Take 1 tablet by mouth every 4 (four) hours as needed for pain.   oxymorphone (OPANA ER) 15 MG 12 hr tablet Take 15 mg by mouth 2 (two) times daily as needed for pain.   predniSONE (STERAPRED UNI-PAK 21 TAB) 10 MG (21) TBPK tablet Directions for 6 day taper: Day 1: 2 tablets before breakfast, 1 after both lunch & dinner and 2 at bedtime Day 2: 1 tab before breakfast, 1 after both lunch & dinner and 2 at bedtime Day 3: 1 tab at each meal & 1 at bedtime Day 4:  1 tab at breakfast, 1 at lunch, 1 at bedtime Day 5: 1 tab at breakfast & 1 tab at bedtime Day 6: 1 tab at breakfast   rizatriptan (MAXALT-MLT) 10 MG disintegrating tablet DISSOLVE 1 TABLET IN MOUTH AS NEEDED FOR MIGRAINE. MAY REPEAT IN 2 HOURS IF NEEDED   rosuvastatin (CRESTOR) 5 MG tablet Take 1 tablet by mouth once daily   varenicline (CHANTIX CONTINUING MONTH PAK) 1 MG tablet Take 1 tablet (1 mg total) by mouth 2 (two) times daily. From second week.   No facility-administered medications prior to visit.   No Known Allergies  Immunization History  Administered Date(s) Administered   Influenza,inj,Quad PF,6+ Mos 05/10/2017, 02/23/2018, 03/15/2020, 03/17/2021   Influenza,inj,quad, With Preservative 04/27/2019   Influenza-Unspecified 04/14/2017, 02/23/2018   PFIZER(Purple Top)SARS-COV-2 Vaccination 02/13/2020, 03/05/2020, 09/12/2020   Pneumococcal Conjugate-13 03/16/2018   Pneumococcal Polysaccharide-23 05/06/2012   Tdap 07/20/2017   Unspecified SARS-COV-2 Vaccination 04/07/2021    Health Maintenance  Topic Date Due   INFLUENZA VACCINE  02/03/2022   COVID-19 Vaccine (5 - 2023-24 season) 03/06/2022   MAMMOGRAM  05/21/2022   PAP SMEAR-Modifier  03/05/2024   DTaP/Tdap/Td (2 - Td or Tdap) 07/21/2027   Hepatitis C Screening  Completed   HIV Screening  Completed   HPV VACCINES  Aged Out    Patient Care Team: Patient, No Pcp Per as PCP - General (General Practice)  Review of Systems  {Labs  Heme  Chem  Endocrine  Serology  Results Review (optional):23779}   Objective    LMP 04/11/2012  {Show previous vital signs (optional):23777}  Physical Exam ***  Depression Screen    10/23/2021    2:36 PM 08/11/2021    9:01 AM 06/02/2021    1:30 PM 03/17/2021   11:18 AM  PHQ 2/9 Scores  PHQ - 2 Score 0 0 0 0  PHQ- 9 Score  0  0   No results found for any visits on 06/10/22.  Assessment & Plan     ***  No follow-ups on file.     {provider attestation***:1}   [name], [place] [phone] (phone) [phone] (fax)  [place]

## 2022-06-10 ENCOUNTER — Ambulatory Visit: Payer: Medicaid Other | Admitting: Family Medicine

## 2022-06-16 ENCOUNTER — Other Ambulatory Visit: Payer: Self-pay | Admitting: Adult Health

## 2022-06-16 DIAGNOSIS — G43709 Chronic migraine without aura, not intractable, without status migrainosus: Secondary | ICD-10-CM

## 2022-06-17 ENCOUNTER — Other Ambulatory Visit: Payer: Self-pay | Admitting: Adult Health

## 2022-06-17 DIAGNOSIS — G43709 Chronic migraine without aura, not intractable, without status migrainosus: Secondary | ICD-10-CM

## 2022-06-18 ENCOUNTER — Other Ambulatory Visit: Payer: Self-pay | Admitting: *Deleted

## 2022-06-18 DIAGNOSIS — G43709 Chronic migraine without aura, not intractable, without status migrainosus: Secondary | ICD-10-CM

## 2022-06-18 MED ORDER — EMGALITY 120 MG/ML ~~LOC~~ SOAJ: SUBCUTANEOUS | 0 refills | Status: DC

## 2022-06-19 DIAGNOSIS — J9691 Respiratory failure, unspecified with hypoxia: Secondary | ICD-10-CM | POA: Diagnosis not present

## 2022-06-19 DIAGNOSIS — J84116 Cryptogenic organizing pneumonia: Secondary | ICD-10-CM | POA: Diagnosis not present

## 2022-06-22 ENCOUNTER — Other Ambulatory Visit: Payer: Self-pay | Admitting: *Deleted

## 2022-06-22 DIAGNOSIS — M5416 Radiculopathy, lumbar region: Secondary | ICD-10-CM | POA: Diagnosis not present

## 2022-06-22 DIAGNOSIS — Z981 Arthrodesis status: Secondary | ICD-10-CM | POA: Diagnosis not present

## 2022-06-22 DIAGNOSIS — G8929 Other chronic pain: Secondary | ICD-10-CM | POA: Diagnosis not present

## 2022-06-22 DIAGNOSIS — G43709 Chronic migraine without aura, not intractable, without status migrainosus: Secondary | ICD-10-CM

## 2022-06-22 DIAGNOSIS — M5136 Other intervertebral disc degeneration, lumbar region: Secondary | ICD-10-CM | POA: Diagnosis not present

## 2022-06-22 MED ORDER — EMGALITY 120 MG/ML ~~LOC~~ SOAJ: SUBCUTANEOUS | 0 refills | Status: AC

## 2022-07-10 ENCOUNTER — Encounter (HOSPITAL_COMMUNITY): Payer: Self-pay

## 2022-07-10 ENCOUNTER — Inpatient Hospital Stay (HOSPITAL_COMMUNITY): Admission: RE | Admit: 2022-07-10 | Payer: Medicaid Other | Source: Ambulatory Visit

## 2022-07-10 DIAGNOSIS — Z1231 Encounter for screening mammogram for malignant neoplasm of breast: Secondary | ICD-10-CM

## 2022-07-26 ENCOUNTER — Other Ambulatory Visit: Payer: Self-pay | Admitting: Adult Health

## 2022-07-26 DIAGNOSIS — G43709 Chronic migraine without aura, not intractable, without status migrainosus: Secondary | ICD-10-CM

## 2022-08-06 ENCOUNTER — Telehealth: Payer: Self-pay

## 2022-08-06 NOTE — Telephone Encounter (Signed)
Copied from [place] [id number]. Topic: General - Inquiry >> Aug 05, 2022  2:32 PM Marcellus Scott wrote: Reason for CRM: Stanton Kid[facility] stated pt put FNP Elise,Payne as her primary care; however, they do see she hasn't actually seen Ms.Rollene Rotunda pt has an upcoming appointment to establish care on 02/07. Stanton Kidney is asking if it's okay for them to send the patient's mammogram report to Ms. Rollene Rotunda; they stated they want to ask first in case the patient may need additional orders.  Stanton Kidney stated she is leaving for the day; if the nurse would like to send her a message via the in basket that would be great.  Kathi Der  Please advise.

## 2022-08-07 ENCOUNTER — Inpatient Hospital Stay (HOSPITAL_COMMUNITY): Admission: RE | Admit: 2022-08-07 | Payer: Medicaid Other | Source: Ambulatory Visit

## 2022-08-07 ENCOUNTER — Encounter (HOSPITAL_COMMUNITY): Payer: Self-pay

## 2022-08-07 DIAGNOSIS — Z1231 Encounter for screening mammogram for malignant neoplasm of breast: Secondary | ICD-10-CM

## 2022-08-10 NOTE — Telephone Encounter (Signed)
Phone continued to wrong. Unable to leave voicemail. CRM created. Ok for [facility] to advise if call is returned

## 2022-08-12 ENCOUNTER — Ambulatory Visit
Admission: RE | Admit: 2022-08-12 | Discharge: 2022-08-12 | Disposition: A | Discharge status: Home / Self Care | Payer: Commercial Managed Care - HMO | Source: Ambulatory Visit | Attending: Family Medicine | Admitting: Family Medicine

## 2022-08-12 ENCOUNTER — Ambulatory Visit: Payer: Commercial Managed Care - HMO | Admitting: Family Medicine

## 2022-08-12 ENCOUNTER — Ambulatory Visit
Admission: RE | Admit: 2022-08-12 | Discharge: 2022-08-12 | Disposition: A | Discharge status: Home / Self Care | Payer: Commercial Managed Care - HMO | Attending: Family Medicine | Admitting: Family Medicine

## 2022-08-12 ENCOUNTER — Encounter: Payer: Self-pay | Admitting: Family Medicine

## 2022-08-12 VITALS — BP 110/69 | HR 82 | Temp 97.9°F | Ht 60.0 in | Wt 197.9 lb

## 2022-08-12 DIAGNOSIS — M25532 Pain in left wrist: Secondary | ICD-10-CM | POA: Insufficient documentation

## 2022-08-12 DIAGNOSIS — M5442 Lumbago with sciatica, left side: Secondary | ICD-10-CM

## 2022-08-12 DIAGNOSIS — G8929 Other chronic pain: Secondary | ICD-10-CM

## 2022-08-12 DIAGNOSIS — G4733 Obstructive sleep apnea (adult) (pediatric): Secondary | ICD-10-CM

## 2022-08-12 DIAGNOSIS — F11282 Opioid dependence with opioid-induced sleep disorder: Secondary | ICD-10-CM

## 2022-08-12 DIAGNOSIS — R739 Hyperglycemia, unspecified: Secondary | ICD-10-CM | POA: Insufficient documentation

## 2022-08-12 DIAGNOSIS — F411 Generalized anxiety disorder: Secondary | ICD-10-CM | POA: Insufficient documentation

## 2022-08-12 DIAGNOSIS — Z1231 Encounter for screening mammogram for malignant neoplasm of breast: Secondary | ICD-10-CM

## 2022-08-12 DIAGNOSIS — E7800 Pure hypercholesterolemia, unspecified: Secondary | ICD-10-CM

## 2022-08-12 DIAGNOSIS — M7989 Other specified soft tissue disorders: Secondary | ICD-10-CM | POA: Diagnosis not present

## 2022-08-12 DIAGNOSIS — G43009 Migraine without aura, not intractable, without status migrainosus: Secondary | ICD-10-CM

## 2022-08-12 MED ORDER — AMITRIPTYLINE HCL 25 MG PO TABS: ORAL_TABLET | ORAL | 0 refills | Status: DC

## 2022-08-12 MED ORDER — DULOXETINE HCL 30 MG PO CPEP: 30.0000 mg | ORAL_CAPSULE | Freq: Every day | ORAL | 1 refills | Status: DC

## 2022-08-12 NOTE — Assessment & Plan Note (Signed)
Chronic, improving Body mass index is 38.65 kg/m. Continue to recommend balanced, lower carb meals. Smaller meal size, adding snacks. Choosing water as drink of choice and increasing purposeful exercise.
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
## 2022-08-12 NOTE — Assessment & Plan Note (Signed)
Chronic, previously elevated in 2021 Repeat LP Previously controlled on crestor 5 mg; LDL goal of <100 recommend diet low in saturated fat and regular exercise - 30 min at least 5 times per week

## 2022-08-12 NOTE — Assessment & Plan Note (Signed)
Recommend DM screening; pt's daughter has gestational DM.

## 2022-08-12 NOTE — Assessment & Plan Note (Signed)
Due for screening for mammogram, denies breast concerns, provided with phone number to call and schedule appointment for mammogram. Encouraged to repeat breast cancer screening every year.

## 2022-08-12 NOTE — Assessment & Plan Note (Signed)
3 week hx; no known injury Endorses pain and difficulty with movement Denies bruising Endorses swelling Non dominant hand Has been wrapping with kerlix Pain with both dorsal and palmar hands together Pain along side of ulnar nerve side Recommend xrays and follow up with additional imaging/ specialist if needed

## 2022-08-12 NOTE — Assessment & Plan Note (Signed)
Chronic, stable Defer additional controlled substances at this time Pt denies drug or alcohol use/abuse

## 2022-08-12 NOTE — Assessment & Plan Note (Signed)
Referral back to neurology; wishes to re-establish elavil for assistance as well as restart emgality Endorses 5 migraines/week Pt did not ask for maxalt PRN

## 2022-08-12 NOTE — Telephone Encounter (Signed)
Patient seen in office today, 08/12/22
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
## 2022-08-13 LAB — COMPREHENSIVE METABOLIC PANEL
ALT: 13 IU/L (ref 0–32)
AST: 16 IU/L (ref 0–40)
Albumin/Globulin Ratio: 2 (ref 1.2–2.2)
Albumin: 4.1 g/dL (ref 3.9–4.9)
Alkaline Phosphatase: 86 IU/L (ref 44–121)
BUN/Creatinine Ratio: 15 (ref 9–23)
BUN: 13 mg/dL (ref 6–24)
Bilirubin Total: <0.2 mg/dL (ref 0.0–1.2)
CO2: 22 mmol/L (ref 20–29)
Calcium: 9.4 mg/dL (ref 8.7–10.2)
Chloride: 100 mmol/L (ref 96–106)
Creatinine, Ser: 0.87 mg/dL (ref 0.57–1.00)
Globulin, Total: 2.1 g/dL (ref 1.5–4.5)
Glucose: 91 mg/dL (ref 70–99)
Potassium: 4.2 mmol/L (ref 3.5–5.2)
Sodium: 138 mmol/L (ref 134–144)
Total Protein: 6.2 g/dL (ref 6.0–8.5)
eGFR: 84 mL/min/{1.73_m2} (ref >59)

## 2022-08-13 LAB — CBC WITH DIFFERENTIAL/PLATELET
Basophils Absolute: 0.1 10*3/uL (ref 0.0–0.2)
Basos: 1 %
EOS (ABSOLUTE): 0.3 10*3/uL (ref 0.0–0.4)
Eos: 3 %
Hematocrit: 44.6 % (ref 34.0–46.6)
Hemoglobin: 15.5 g/dL (ref 11.1–15.9)
Immature Grans (Abs): 0 10*3/uL (ref 0.0–0.1)
Immature Granulocytes: 0 %
Lymphocytes Absolute: 4.8 10*3/uL — ABNORMAL HIGH (ref 0.7–3.1)
Lymphs: 49 %
MCH: 31.1 pg (ref 26.6–33.0)
MCHC: 34.8 g/dL (ref 31.5–35.7)
MCV: 90 fL (ref 79–97)
Monocytes Absolute: 0.6 10*3/uL (ref 0.1–0.9)
Monocytes: 6 %
Neutrophils Absolute: 3.9 10*3/uL (ref 1.4–7.0)
Neutrophils: 41 %
Platelets: 349 10*3/uL (ref 150–450)
RBC: 4.98 x10E6/uL (ref 3.77–5.28)
RDW: 13.1 % (ref 11.7–15.4)
WBC: 9.7 10*3/uL (ref 3.4–10.8)

## 2022-08-13 LAB — LIPID PANEL
Chol/HDL Ratio: 5.8 ratio — ABNORMAL HIGH (ref 0.0–4.4)
Cholesterol, Total: 179 mg/dL (ref 100–199)
HDL: 31 mg/dL — ABNORMAL LOW (ref >39)
LDL Chol Calc (NIH): 84 mg/dL (ref 0–99)
Triglycerides: 393 mg/dL — ABNORMAL HIGH (ref 0–149)
VLDL Cholesterol Cal: 64 mg/dL — ABNORMAL HIGH (ref 5–40)

## 2022-08-13 LAB — HEMOGLOBIN A1C
Est. average glucose Bld gHb Est-mCnc: 111 mg/dL
Hgb A1c MFr Bld: 5.5 % (ref 4.8–5.6)

## 2022-08-13 NOTE — Progress Notes (Signed)
Hello,    Your lab results have returned. - stable total cholesterol and LDL; however, elevated fats and low HDL  - I continue to recommend diet low in saturated fat and regular exercise - 30 min at least 5 times per week given stable low ASCVD risk of 4.2% in 10 years. The 10-year ASCVD risk score (Arnett DK, et al., 2019) is: 4.2%   Values used to calculate the score:     Age: 47 years     Sex: Female     Is Non-Hispanic African American: No     Diabetic: No     Tobacco smoker: Yes     Systolic Blood Pressure: 749 mmHg     Is BP treated: No     HDL Cholesterol: 31 mg/dL     Total Cholesterol: 179 mg/dL Please let us know if you have any questions.  Thank you, [name], [address] [place], [place] [address] [phone] (phone) [phone] (fax) [place]

## 2022-08-14 ENCOUNTER — Telehealth: Payer: Self-pay | Admitting: Orthopaedic Surgery

## 2022-08-14 ENCOUNTER — Other Ambulatory Visit: Payer: Self-pay | Admitting: Internal Medicine

## 2022-08-14 ENCOUNTER — Telehealth: Payer: Self-pay | Admitting: Orthopedic Surgery

## 2022-08-14 ENCOUNTER — Other Ambulatory Visit: Payer: Self-pay | Admitting: Family Medicine

## 2022-08-14 DIAGNOSIS — E7841 Elevated Lipoprotein(a): Secondary | ICD-10-CM

## 2022-08-14 DIAGNOSIS — S62102A Fracture of unspecified carpal bone, left wrist, initial encounter for closed fracture: Secondary | ICD-10-CM

## 2022-08-14 NOTE — Telephone Encounter (Signed)
error 

## 2022-08-14 NOTE — Progress Notes (Signed)
Recommend follow up with sports medicine/orthopedics.  Minimal distal radioulnar joint space narrowing and peripheral degenerative spurring.  Minimal fifth DIP osteoarthritis. Tiny 1 mm ossicle at the dorsal base of the distal phalanx of the fifth finger, age indeterminate. This could represent an age indeterminate avulsion injury at the extensor tendon distal insertion. Recommend correlation for point tenderness.

## 2022-08-14 NOTE — Telephone Encounter (Signed)
April Ayala with [facility] called, stated that she has an urgent referral.  Advised that we wouldn't have a doctor in the office until next Tuesday.  The referral was sent to another office and that office has reached out to the patient.

## 2022-08-24 ENCOUNTER — Other Ambulatory Visit (HOSPITAL_COMMUNITY): Payer: Self-pay | Admitting: Family Medicine

## 2022-08-24 DIAGNOSIS — M25532 Pain in left wrist: Secondary | ICD-10-CM | POA: Diagnosis not present

## 2022-08-24 DIAGNOSIS — Z1231 Encounter for screening mammogram for malignant neoplasm of breast: Secondary | ICD-10-CM

## 2022-08-27 ENCOUNTER — Inpatient Hospital Stay (HOSPITAL_COMMUNITY): Admission: RE | Admit: 2022-08-27 | Payer: Medicaid Other | Source: Ambulatory Visit

## 2022-08-27 ENCOUNTER — Telehealth: Payer: Commercial Managed Care - HMO | Admitting: Nurse Practitioner

## 2022-08-27 DIAGNOSIS — B001 Herpesviral vesicular dermatitis: Secondary | ICD-10-CM

## 2022-08-27 DIAGNOSIS — Z1231 Encounter for screening mammogram for malignant neoplasm of breast: Secondary | ICD-10-CM

## 2022-08-27 MED ORDER — ACYCLOVIR 5 % EX CREA: 1.0000 | TOPICAL_CREAM | CUTANEOUS | 0 refills | Status: DC

## 2022-08-27 MED ORDER — VALACYCLOVIR HCL 1 G PO TABS: 2000.0000 mg | ORAL_TABLET | Freq: Two times a day (BID) | ORAL | 0 refills | Status: AC

## 2022-08-27 NOTE — Progress Notes (Signed)
We are sorry that you are not feeling well.  Here is how we plan to help!  Based on what you have shared with me it does look like you have a viral infection.    Most cold sores or fever blisters are small fluid filled blisters around the mouth caused by herpes simplex virus.  The most common strain of the virus causing cold sores is herpes simplex virus 1.  It can be spread by skin contact, sharing eating utensils, or even sharing towels.  Cold sores are contagious to other people until dry. (Approximately 5-7 days).  Wash your hands. You can spread the virus to your eyes through handling your contact lenses after touching the lesions.  Most people experience pain at the sight or tingling sensations in their lips that may begin before the ulcers erupt.  Herpes simplex is treatable but not curable.  It may lie dormant for a long time and then reappear due to stress or prolonged sun exposure.  Many patients have success in treating their cold sores with an over the counter topical called Abreva.  You may apply the cream up to 5 times daily (maximum 10 days) until healing occurs.  If you would like to use an oral antiviral medication to speed the healing of your cold sore, I have sent a prescription to your local pharmacy Valacyclovir 2 gm take by mouth twice a day for 1 day   We will also prescribe the cream though this is often not covered by insurance   Meds ordered this encounter  Medications   valACYclovir (VALTREX) 1000 MG tablet    Sig: Take 2 tablets (2,000 mg total) by mouth 2 (two) times daily for 1 day.    Dispense:  4 tablet    Refill:  0   acyclovir cream (ZOVIRAX) 5 %    Sig: Apply 1 Application topically every 4 (four) hours while awake.    Dispense:  15 g    Refill:  0     HOME CARE:  Wash your hands frequently. Do not pick at or rub the sore. Don't open the blisters. Avoid kissing other people during this time. Avoid sharing drinking glasses, eating utensils, or  razors. Do not handle contact lenses unless you have thoroughly washed your hands with soap and warm water! Avoid oral sex during this time.  Herpes from sores on your mouth can spread to your partner's genital area. Avoid contact with anyone who has eczema or a weakened immune system. Cold sores are often triggered by exposure to intense sunlight, use a lip balm containing a sunscreen (SPF 30 or higher).  GET HELP RIGHT AWAY IF:  Blisters look infected. Blisters occur near or in the eye. Symptoms last longer than 10 days. Your symptoms become worse.  MAKE SURE YOU:  Understand these instructions. Will watch your condition. Will get help right away if you are not doing well or get worse.    Your e-visit answers were reviewed by a board certified advanced clinical practitioner to complete your personal care plan.  Depending upon the condition, your plan could have  Included both over the counter or prescription medications.    Please review your pharmacy choice.  Be sure that the pharmacy you have chosen is open so that you can pick up your prescription now.  If there is a problem you can message your provider in [place] to have the prescription routed to another pharmacy.    Your safety is important to  [place].  If you have drug allergies check our prescription carefully.  For the next 24 hours you can use MyChart to ask questions about today's visit, request a non-urgent call back, or ask for a work or school excuse from your e-visit provider.  You will get an email in the next two days asking about your experience.  I hope that your e-visit has been valuable and will speed your recovery.   I spent approximately 5 minutes reviewing the patient's history, current symptoms and coordinating their care today.

## 2022-09-01 ENCOUNTER — Other Ambulatory Visit (HOSPITAL_COMMUNITY): Payer: Self-pay | Admitting: Family Medicine

## 2022-09-01 DIAGNOSIS — Z1231 Encounter for screening mammogram for malignant neoplasm of breast: Secondary | ICD-10-CM

## 2022-09-03 ENCOUNTER — Encounter: Payer: Self-pay | Admitting: Radiology

## 2022-09-03 DIAGNOSIS — M25532 Pain in left wrist: Secondary | ICD-10-CM | POA: Diagnosis not present

## 2022-09-04 ENCOUNTER — Encounter (HOSPITAL_COMMUNITY): Payer: Self-pay

## 2022-09-04 ENCOUNTER — Inpatient Hospital Stay (HOSPITAL_COMMUNITY): Admission: RE | Admit: 2022-09-04 | Payer: Medicaid Other | Source: Ambulatory Visit

## 2022-09-04 DIAGNOSIS — Z1231 Encounter for screening mammogram for malignant neoplasm of breast: Secondary | ICD-10-CM

## 2022-09-13 ENCOUNTER — Encounter: Payer: Self-pay | Admitting: Family Medicine

## 2022-09-15 ENCOUNTER — Telehealth: Payer: Self-pay | Admitting: Family Medicine

## 2022-09-15 NOTE — Telephone Encounter (Signed)
[place] faxed refill request for the following medications:   amitriptyline (ELAVIL) 25 MG tablet    Please advise

## 2022-09-17 ENCOUNTER — Telehealth: Payer: Commercial Managed Care - HMO | Admitting: Physician Assistant

## 2022-09-17 DIAGNOSIS — B001 Herpesviral vesicular dermatitis: Secondary | ICD-10-CM

## 2022-09-17 MED ORDER — ACYCLOVIR 5 % EX CREA: 1.0000 | TOPICAL_CREAM | CUTANEOUS | 0 refills | Status: DC

## 2022-09-17 NOTE — Progress Notes (Signed)
We are sorry that you are not feeling well.  Here is how we plan to help!  Based on what you have shared with me it does look like you have a viral infection.    Most cold sores or fever blisters are small fluid filled blisters around the mouth caused by herpes simplex virus.  The most common strain of the virus causing cold sores is herpes simplex virus 1.  It can be spread by skin contact, sharing eating utensils, or even sharing towels.  Cold sores are contagious to other people until dry. (Approximately 5-7 days).  Wash your hands. You can spread the virus to your eyes through handling your contact lenses after touching the lesions.  Most people experience pain at the sight or tingling sensations in their lips that may begin before the ulcers erupt.  Herpes simplex is treatable but not curable.  It may lie dormant for a long time and then reappear due to stress or prolonged sun exposure.  Many patients have success in treating their cold sores with an over the counter topical called Abreva.  You may apply the cream up to 5 times daily (maximum 10 days) until healing occurs.  I have prescribed the Zovirax cream for you to use as directed.  HOME CARE:  Wash your hands frequently. Do not pick at or rub the sore. Don't open the blisters. Avoid kissing other people during this time. Avoid sharing drinking glasses, eating utensils, or razors. Do not handle contact lenses unless you have thoroughly washed your hands with soap and warm water! Avoid oral sex during this time.  Herpes from sores on your mouth can spread to your partner's genital area. Avoid contact with anyone who has eczema or a weakened immune system. Cold sores are often triggered by exposure to intense sunlight, use a lip balm containing a sunscreen (SPF 30 or higher).  GET HELP RIGHT AWAY IF:  Blisters look infected. Blisters occur near or in the eye. Symptoms last longer than 10 days. Your symptoms become worse.  MAKE  SURE YOU:  Understand these instructions. Will watch your condition. Will get help right away if you are not doing well or get worse.    Your e-visit answers were reviewed by a board certified advanced clinical practitioner to complete your personal care plan.  Depending upon the condition, your plan could have  Included both over the counter or prescription medications.    Please review your pharmacy choice.  Be sure that the pharmacy you have chosen is open so that you can pick up your prescription now.  If there is a problem you can message your provider in [place] to have the prescription routed to another pharmacy.    Your safety is important to [place].  If you have drug allergies check our prescription carefully.  For the next 24 hours you can use MyChart to ask questions about today's visit, request a non-urgent call back, or ask for a work or school excuse from your e-visit provider.  You will get an email in the next two days asking about your experience.  I hope that your e-visit has been valuable and will speed your recovery.

## 2022-09-17 NOTE — Progress Notes (Signed)
I have spent 5 minutes in review of e-visit questionnaire, review and updating patient chart, medical decision making and response to patient.   Latavius Capizzi Cody Fergus Throne, PA-C    

## 2022-09-17 NOTE — Telephone Encounter (Signed)
Last OV/refill 08/12/22. Please advise

## 2022-09-18 ENCOUNTER — Other Ambulatory Visit: Payer: Self-pay | Admitting: Physician Assistant

## 2022-09-18 ENCOUNTER — Ambulatory Visit: Payer: Medicaid Other | Admitting: Family Medicine

## 2022-09-18 MED ORDER — ACYCLOVIR 5 % EX OINT: TOPICAL_OINTMENT | CUTANEOUS | 0 refills | Status: DC

## 2022-09-21 ENCOUNTER — Telehealth: Payer: Self-pay | Admitting: Family Medicine

## 2022-09-21 ENCOUNTER — Other Ambulatory Visit: Payer: Self-pay

## 2022-09-21 DIAGNOSIS — G43009 Migraine without aura, not intractable, without status migrainosus: Secondary | ICD-10-CM

## 2022-09-21 MED ORDER — AMITRIPTYLINE HCL 25 MG PO TABS: ORAL_TABLET | ORAL | 0 refills | Status: DC

## 2022-09-21 NOTE — Telephone Encounter (Signed)
April Ayala requesting prescription refill amitriptyline (ELAVIL) 25 MG tablet   Please advise

## 2022-09-21 NOTE — Telephone Encounter (Signed)
Called, left VM to call back with information from provider's questions.  Patient has ROI on file.

## 2022-09-23 ENCOUNTER — Other Ambulatory Visit (HOSPITAL_COMMUNITY): Payer: Self-pay | Admitting: Family Medicine

## 2022-09-23 DIAGNOSIS — Z1231 Encounter for screening mammogram for malignant neoplasm of breast: Secondary | ICD-10-CM

## 2022-09-24 ENCOUNTER — Ambulatory Visit: Payer: Medicaid Other | Admitting: Family Medicine

## 2022-10-02 NOTE — Telephone Encounter (Signed)
Prescription was sent in by 03/18

## 2022-10-05 ENCOUNTER — Encounter: Payer: Self-pay | Admitting: Family Medicine

## 2022-10-09 ENCOUNTER — Inpatient Hospital Stay (HOSPITAL_COMMUNITY): Admission: RE | Admit: 2022-10-09 | Payer: Commercial Managed Care - HMO | Source: Ambulatory Visit

## 2022-10-09 DIAGNOSIS — Z1231 Encounter for screening mammogram for malignant neoplasm of breast: Secondary | ICD-10-CM

## 2022-10-13 ENCOUNTER — Telehealth: Payer: Self-pay | Admitting: *Deleted

## 2022-10-13 NOTE — Telephone Encounter (Signed)
  ATC x1.  LVM to return call.  When she returns call, please schedule OV for f/u.   Dx: OSA (obstructive sleep apnea); Snorin...   1 Result Note Details  Procedure Note Patient Name: April, Ayala Date: 04/24/2022 Gender: Female D.O.B: 15-Oct-1975 Age (years): 46 Referring Provider: Tammy Parrett Height (inches): 60 Interpreting Physician: Cyril Mourning MD, ABSM Weight (lbs): 229 RPSGT: Peak, Robert BMI: 45 MRN: [id number] Neck Size: <br> <br> <br> CLINICAL INFORMATION Sleep Study Type: NPSG       Indication for sleep study: sleepiness, snoring       Epworth Sleepiness Score: 10       SLEEP STUDY TECHNIQUE As per the AASM Manual for the Scoring of Sleep and Associated Events v2.3 (April 2016) with a hypopnea requiring 4% desaturations.   The channels recorded and monitored were frontal, central and occipital EEG, electrooculogram (EOG), submentalis EMG (chin), nasal and oral airflow, thoracic and abdominal wall motion, anterior tibialis EMG, snore microphone, electrocardiogram, and pulse oximetry.   MEDICATIONS Medications self-administered by patient taken the night of the study : N/A   SLEEP ARCHITECTURE The study was initiated at 9:36:22 PM and ended at 5:22:21 AM.   Sleep onset time was 29.9 minutes and the sleep efficiency was 88.3%. The total sleep time was 411.5 minutes.   Stage REM latency was 53.5 minutes.   The patient spent 0.97% of the night in stage N1 sleep, 68.53% in stage N2 sleep, 3.52% in stage N3 and 27% in REM.   Alpha intrusion was absent.   Supine sleep was 0.00%.   RESPIRATORY PARAMETERS The overall apnea/hypopnea index (AHI) was 7.3 per hour. There were 32 total apneas, including 0 obstructive, 32 central and 0 mixed apneas. There were 18 hypopneas and 1 RERAs.   The AHI during Stage REM sleep was 6.5 per hour.   AHI while supine was N/A per hour.   The mean oxygen saturation was 95.00%. The minimum SpO2 during sleep was  85.00%.   moderate snoring was noted during this study.   CARDIAC DATA The 2 lead EKG demonstrated sinus rhythm. The mean heart rate was 64.38 beats per minute. Other EKG findings include: None.   LEG MOVEMENT DATA The total PLMS were 0 with a resulting PLMS index of 0.00. Associated arousal with leg movement index was 0.0 .   IMPRESSIONS - Mild obstructive sleep apnea occurred during this study (AHI = 7.3/h). - No significant central sleep apnea occurred during this study (CAI = 4.7/h). - Mild oxygen desaturation was noted during this study (Min O2 = 85.00%). - The patient snored with moderate snoring volume. - No cardiac abnormalities were noted during this study. - Clinically significant periodic limb movements did not occur during sleep. No significant associated arousals.   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 Return to discuss treatment options. Investigate other causes of somnolence - Avoid alcohol, sedatives and other CNS depressants that may worsen sleep apnea and disrupt normal sleep architecture. - Sleep hygiene should be reviewed to assess factors that may improve sleep quality. - Weight management and regular exercise should be initiated or continued if appropriate.       Cyril Mourning MD Board Certified in Sleep medicine  Per Rubye Oaks NP: Can we see if patient would like to come in to discuss results

## 2022-10-15 DIAGNOSIS — Z5181 Encounter for therapeutic drug level monitoring: Secondary | ICD-10-CM | POA: Diagnosis not present

## 2022-10-15 DIAGNOSIS — Z79899 Other long term (current) drug therapy: Secondary | ICD-10-CM | POA: Diagnosis not present

## 2022-10-16 ENCOUNTER — Telehealth: Payer: Self-pay | Admitting: Family Medicine

## 2022-10-16 ENCOUNTER — Other Ambulatory Visit: Payer: Self-pay

## 2022-10-16 DIAGNOSIS — G43009 Migraine without aura, not intractable, without status migrainosus: Secondary | ICD-10-CM

## 2022-10-16 NOTE — Telephone Encounter (Signed)
[facility] pharmacy requesting prescription refill amitriptyline (ELAVIL) 25 MG tablet   Please advise 

## 2022-10-19 NOTE — Telephone Encounter (Signed)
Requested medication (s) are due for refill today: routing for review  Requested medication (s) are on the active medication list: yes  Last refill:  09/21/22  Future visit scheduled: yes  Notes to clinic:  Unable to refill per protocol, waiting on patient to verify dosage of medication, routing for review.      Requested Prescriptions  Pending Prescriptions Disp Refills   amitriptyline (ELAVIL) 25 MG tablet 90 tablet 0    Sig: Take 1 tablet/nightly for migraine prevention for 1 week; increase to 2 tablets/nightly for 2 weeks; increase to 3 tablets on 3rd week. Hold titration if not tolerated.     Psychiatry:  Antidepressants - Heterocyclics (TCAs) Passed - 10/16/2022  2:08 PM      Passed - Completed PHQ-2 or PHQ-9 in the last 360 days      Passed - Valid encounter within last 6 months    Recent Outpatient Visits           2 months ago Encounter for screening mammogram for malignant neoplasm of breast   [facility] Merita Norton T, FNP   8 years ago Frequent urination   [facility] at [facility], Myrle Sheng, MD       Future Appointments             In 3 months Jacky Kindle, FNP [facility], [facility]

## 2022-10-19 NOTE — Telephone Encounter (Signed)
Patient called. LVMTCB regarding current dosage of amitriptyline (ELAVIL) 25 mg

## 2022-10-20 ENCOUNTER — Other Ambulatory Visit: Payer: Self-pay | Admitting: Family Medicine

## 2022-10-20 MED ORDER — AMITRIPTYLINE HCL 75 MG PO TABS: 75.0000 mg | ORAL_TABLET | Freq: Every day | ORAL | 3 refills | Status: DC

## 2022-11-16 ENCOUNTER — Encounter: Payer: Self-pay | Admitting: *Deleted

## 2022-11-16 NOTE — Telephone Encounter (Signed)
Lets try to set up OV if not please send my chart message to her .

## 2022-11-16 NOTE — Telephone Encounter (Signed)
Mychart message sent to patient to contact the office to schedule OV.

## 2022-11-29 ENCOUNTER — Other Ambulatory Visit (HOSPITAL_COMMUNITY): Payer: Self-pay

## 2022-12-17 ENCOUNTER — Encounter: Payer: Self-pay | Admitting: *Deleted

## 2022-12-17 NOTE — Progress Notes (Signed)
ATC patient x2.  LVM to return call and to schedule f/u in office.  Unable to reach letter sent.

## 2023-01-13 ENCOUNTER — Other Ambulatory Visit: Payer: Self-pay | Admitting: Family Medicine

## 2023-01-13 ENCOUNTER — Encounter: Payer: Self-pay | Admitting: Family Medicine

## 2023-01-13 MED ORDER — OMEPRAZOLE 40 MG PO CPDR: 40.0000 mg | DELAYED_RELEASE_CAPSULE | Freq: Every day | ORAL | 3 refills | Status: DC

## 2023-01-13 MED ORDER — ROSUVASTATIN CALCIUM 5 MG PO TABS: 5.0000 mg | ORAL_TABLET | Freq: Every day | ORAL | 3 refills | Status: DC

## 2023-02-02 ENCOUNTER — Other Ambulatory Visit: Payer: Self-pay | Admitting: Family Medicine

## 2023-02-02 DIAGNOSIS — G8929 Other chronic pain: Secondary | ICD-10-CM

## 2023-02-02 DIAGNOSIS — F411 Generalized anxiety disorder: Secondary | ICD-10-CM

## 2023-02-03 NOTE — Telephone Encounter (Signed)
Requested Prescriptions  Pending Prescriptions Disp Refills   DULoxetine (CYMBALTA) 30 MG capsule [Pharmacy Med Name: DULoxetine HCl 30 MG Oral Capsule Delayed Release Particles] 90 capsule 0    Sig: Take 1 capsule by mouth once daily     Psychiatry: Antidepressants - SNRI - duloxetine Failed - 02/02/2023 10:18 AM      Failed - Completed PHQ-2 or PHQ-9 in the last 360 days      Passed - Cr in normal range and within 360 days    Creat  Date Value Ref Range Status  04/24/2020 0.97 0.50 - 1.10 mg/dL Final   Creatinine, Ser  Date Value Ref Range Status  08/12/2022 0.87 0.57 - 1.00 mg/dL Final         Passed - eGFR is 30 or above and within 360 days    GFR, Est African American  Date Value Ref Range Status  04/24/2020 83 > OR = 60 mL/min/1.45m2 Final   GFR, Est Non African American  Date Value Ref Range Status  04/24/2020 72 > OR = 60 mL/min/1.54m2 Final   GFR, Estimated  Date Value Ref Range Status  08/03/2021 >60 >60 mL/min Final    Comment:    (NOTE) Calculated using the CKD-EPI Creatinine Equation (2021)    eGFR  Date Value Ref Range Status  08/12/2022 84 >59 mL/min/1.73 Final         Passed - Last BP in normal range    BP Readings from Last 1 Encounters:  08/12/22 110/69         Passed - Valid encounter within last 6 months    Recent Outpatient Visits           5 months ago Encounter for screening mammogram for malignant neoplasm of breast   [facility] Merita Norton T, FNP   9 years ago Frequent urination   [facility] at [facility], Myrle Sheng, MD       Future Appointments             In 1 week Jacky Kindle, FNP [facility], [facility]

## 2023-02-10 ENCOUNTER — Ambulatory Visit (INDEPENDENT_AMBULATORY_CARE_PROVIDER_SITE_OTHER): Payer: Medicaid Other | Admitting: Family Medicine

## 2023-02-10 DIAGNOSIS — Z91199 Patient's noncompliance with other medical treatment and regimen due to unspecified reason: Secondary | ICD-10-CM | POA: Insufficient documentation

## 2023-02-10 NOTE — Progress Notes (Signed)
Patient was not seen for appt d/t no call, no show, or late arrival >10 mins past appt time.    T , FNP  [facility] [address] [place], [place] [address] [phone] (phone) [phone] (fax) [facility]  

## 2023-02-18 ENCOUNTER — Encounter: Payer: Self-pay | Admitting: Family Medicine

## 2023-02-18 ENCOUNTER — Ambulatory Visit (INDEPENDENT_AMBULATORY_CARE_PROVIDER_SITE_OTHER): Payer: BLUE CROSS/BLUE SHIELD | Admitting: Family Medicine

## 2023-02-18 VITALS — BP 108/64 | HR 82 | Ht 60.0 in | Wt 195.5 lb

## 2023-02-18 DIAGNOSIS — K642 Third degree hemorrhoids: Secondary | ICD-10-CM | POA: Diagnosis not present

## 2023-02-18 DIAGNOSIS — Z720 Tobacco use: Secondary | ICD-10-CM

## 2023-02-18 DIAGNOSIS — Z6838 Body mass index (BMI) 38.0-38.9, adult: Secondary | ICD-10-CM

## 2023-02-18 DIAGNOSIS — Z1211 Encounter for screening for malignant neoplasm of colon: Secondary | ICD-10-CM | POA: Diagnosis not present

## 2023-02-18 DIAGNOSIS — Z0000 Encounter for general adult medical examination without abnormal findings: Secondary | ICD-10-CM

## 2023-02-18 MED ORDER — HYDROCORTISONE ACETATE 25 MG RE SUPP: 25.0000 mg | Freq: Two times a day (BID) | RECTAL | 0 refills | Status: DC

## 2023-02-18 NOTE — Assessment & Plan Note (Signed)
Declines tobacco cessation Things to do to keep yourself healthy  - Exercise at least 30-45 minutes a day, 3-4 days a week.  - Eat a low-fat diet with lots of fruits and vegetables, up to 7-9 servings per day.  - Seatbelts can save your life. Wear them always.  - Smoke detectors on every level of your home, check batteries every year.  - Eye Doctor - have an eye exam every 1-2 years  - Safe sex - if you may be exposed to STDs, use a condom.  - Alcohol -  If you drink, do it moderately, less than 2 drinks per day.  - Health Care Power of Attorney. Choose someone to speak for you if you are not able.  - Depression is common in our stressful world.If you're feeling down or losing interest in things you normally enjoy, please come in for a visit.  - Violence - If anyone is threatening or hurting you, please call immediately.

## 2023-02-18 NOTE — Assessment & Plan Note (Signed)
Due for initial colon cancer screening  ?

## 2023-02-18 NOTE — Assessment & Plan Note (Signed)
Body mass index is 38.18 kg/m. Discussed importance of healthy weight management Discussed diet and exercise

## 2023-02-18 NOTE — Assessment & Plan Note (Signed)
Chronic, stable Remains pre-contemplative at this time iso cessation efforts

## 2023-02-18 NOTE — Patient Instructions (Signed)
Please call and schedule your mammogram:  [facility]  [address], [address] [facility] [place],  [place]  [address] Get Driving Directions Main: [phone]  Sunday:Closed Monday:7:20 AM - 5:00 PM Tuesday:7:20 AM - 5:00 PM Wednesday:7:20 AM - 5:00 PM Thursday:7:20 AM - 5:00 PM Friday:7:20 AM - 4:30 PM Saturday:Closed  

## 2023-02-18 NOTE — Progress Notes (Signed)
Complete physical exam   Patient: April Ayala   DOB: 1976/07/01   47 y.o. Female  MRN: [id number] Visit Date: 02/18/2023  Today's healthcare provider: Jacky Kindle, FNP  Introduced to nurse practitioner role and practice setting.  All questions answered.  Discussed provider/patient relationship and expectations.  Pt presents with her spouse, Designer, jewellery Complaint  Patient presents with   Annual Exam    Possible spot on rectum, has been an issue for two weeks, no prior treatment    Subjective    April Ayala is a 47 y.o. female who presents today for a complete physical exam.  She reports consuming a general diet. The patient has a physically strenuous job, but has no regular exercise apart from work.  She generally feels fairly well. She reports sleeping fairly well. She does have additional problems to discuss today.   HPI HPI     Annual Exam    Additional comments: Possible spot on rectum, has been an issue for two weeks, no prior treatment       Last edited by Rolly Salter, CMA on 02/18/2023  1:05 PM.      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   Cervical cancer (HCC) 04/2011   Stage 1B squamous cell   Chronic left shoulder pain 10/07/2017   Chronic tension-type headache, not intractable 10/07/2017   Depression    High cholesterol    History of kidney stones    passed   Morbid obesity with BMI of 40.0-44.9, adult (HCC) 09/29/2019   Prehypertension 06/26/2020   S/P lumbar fusion 04/20/2018   Past Surgical History:  Procedure Laterality Date   ABDOMINAL HYSTERECTOMY     BREAST BIOPSY Right    CERVICAL CONIZATION W/BX  03/24/2012   Procedure: CONIZATION CERVIX WITH BIOPSY;  Surgeon: Adam Phenix, MD;  Location: [facility];  Service: Gynecology;  Laterality: N/A;   DIAGNOSTIC LAPAROSCOPY  2007   ectopic   ECTOPIC  PREGNANCY SURGERY     ESSURE TUBAL LIGATION  2010   INSERTION OF SUPRAPUBIC CATHETER  05/03/2012   Procedure: INSERTION OF SUPRAPUBIC CATHETER;  Surgeon: Jeannette Corpus, MD;  Location: [facility];  Service: Gynecology;;   LEEP     LUMBAR LAMINECTOMY/DECOMPRESSION MICRODISCECTOMY N/A 04/20/2018   Procedure: LUMBAR FIVE TO SACRAL ONE DECOMPRESSION AND FUSION, POSSIBLE LUMBAR FOUR TO SACRAL ONE; SCREW AND CAGE INSTRUMENTATION;  Surgeon: Venita Lick, MD;  Location: [facility];  Service: Orthopedics;  Laterality: N/A;  5 hrs   LYMPHADENECTOMY  05/03/2012   Procedure: LYMPHADENECTOMY;  Surgeon: Jeannette Corpus, MD;  Location: [facility];  Service: Gynecology;  Laterality: Bilateral;  pelvic   RADICAL HYSTERECTOMY  05/03/2012   Procedure: RADICAL HYSTERECTOMY;  Surgeon: Jeannette Corpus, MD;  Location: [facility];  Service: Gynecology;;  transposition of the ovaries   SPINE SURGERY     TUBAL LIGATION     Social History   Socioeconomic History   Marital status: Significant Other    Spouse name: Not on file   Number of children: 4   Years of education: Not on file   Highest education level: GED or equivalent  Occupational History   Occupation: [employer]  Tobacco Use   Smoking status: Every Day    Current packs/day: 0.50    Average packs/day: 0.5 packs/day for 29.3 years (14.6 ttl pk-yrs)  Types: Cigarettes    Start date: 11/02/1993   Smokeless tobacco: Never   Tobacco comments:    1 pack/3 days  Vaping Use   Vaping status: Never Used  Substance and Sexual Activity   Alcohol use: Never   Drug use: Never   Sexual activity: Yes    Birth control/protection: Surgical    Comment: hyst  Other Topics Concern   Not on file  Social History Narrative   Lives with fiance and 1 daughter      1 son is with grandmother    Oldest daughter is 5 lives with grandfather      Works as a Merchandiser, retail at [employer].     Lives in [place], [place].      6 dogs, a  beard dragon, a snake,  and a hamster and a cat      Enjoys: taking care of her pets      Diet: avoids lunch due to work, eats all food groups   Caffeine: monsters in the [employer]: limited      Wears seat belt    Does not use phone while driving    Smoke detectors at home       Right handed   Social Determinants of Health   Financial Resource Strain: Low Risk  (02/17/2023)   Overall Financial Resource Strain (CARDIA)    Difficulty of Paying Living Expenses: Not hard at all  Food Insecurity: No Food Insecurity (02/17/2023)   Hunger Vital Sign    Worried About Running Out of Food in the Last Year: Never true    Ran Out of Food in the Last Year: Never true  Transportation Needs: No Transportation Needs (02/17/2023)   PRAPARE - Administrator, Civil Service (Medical): No    Lack of Transportation (Non-Medical): No  Physical Activity: Inactive (03/05/2021)   Exercise Vital Sign    Days of Exercise per Week: 0 days    Minutes of Exercise per Session: 0 min  Stress: No Stress Concern Present (03/05/2021)   [employer] of Occupational Health - Occupational Stress Questionnaire    Feeling of Stress : Only a little  Social Connections: Socially Isolated (02/17/2023)   Social Connection and Isolation Panel [NHANES]    Frequency of Communication with Friends and Family: Once a week    Frequency of Social Gatherings with Friends and Family: Once a week    Attends Religious Services: Never    Database administrator or Organizations: No    Attends Engineer, structural: Not on file    Marital Status: Married  Intimate Partner Violence: Unknown (10/08/2021)   Received from [employer] [facility], [facility]   HITS    Physically Hurt: Not on file    Insult or Talk Down To: Not on file    Threaten Physical Harm: Not on file    Scream or Curse: Not on file   Family Status  Relation Name Status   Mother  Deceased   Father  Alive   Sister  Alive   Sister  Alive   Daughter  Alive    Son  Alive   Daughter  Alive   Daughter  Alive   Sister  Alive  No partnership data on file   Family History  Problem Relation Age of Onset   Diabetes Mother    Hypertension Father    Diabetes Sister    Migraines Sister    Hypothyroidism Sister  Migraines Sister    Migraines Sister    No Known Allergies  Patient Care Team: Jacky Kindle, FNP as PCP - General (Family Medicine)   Medications: Outpatient Medications Prior to Visit  Medication Sig   acyclovir ointment (ZOVIRAX) 5 % Apply three times daily to cold sore until resolution   amitriptyline (ELAVIL) 75 MG tablet Take 1 tablet (75 mg total) by mouth at bedtime.   DULoxetine (CYMBALTA) 30 MG capsule Take 1 capsule by mouth once daily   Galcanezumab-gnlm (EMGALITY) 120 MG/ML SOAJ INJECT 120 MG SUBCUTANEOUSLY EVERY 30 DAYS. MUST BE SEEN FOR FURTHER REFILLS. CALL [phone].   methocarbamol (ROBAXIN) 750 MG tablet Take 750 mg by mouth 3 (three) times daily as needed.   omeprazole (PRILOSEC) 40 MG capsule Take 1 capsule (40 mg total) by mouth daily.   oxyCODONE-acetaminophen (PERCOCET) 10-325 MG tablet Take 1 tablet by mouth every 4 (four) hours as needed for pain.   predniSONE (DELTASONE) 5 MG tablet Take 5 mg by mouth daily with breakfast.   rosuvastatin (CRESTOR) 5 MG tablet Take 1 tablet (5 mg total) by mouth daily.   [DISCONTINUED] oxymorphone (OPANA ER) 15 MG 12 hr tablet Take 15 mg by mouth 2 (two) times daily as needed for pain.   No facility-administered medications prior to visit.    Objective    BP 108/64 (BP Location: Left Arm, Patient Position: Sitting, Cuff Size: Large)   Pulse 82   Ht 5' (1.524 m)   Wt 195 lb 8 oz (88.7 kg)   LMP 04/11/2012   SpO2 98%   BMI 38.18 kg/m   Physical Exam Vitals and nursing note reviewed.  Constitutional:      General: She is awake. She is not in acute distress.    Appearance: Normal appearance. She is well-developed and well-groomed. She is obese. She is not  ill-appearing, toxic-appearing or diaphoretic.  HENT:     Head: Normocephalic and atraumatic.     Jaw: There is normal jaw occlusion. No trismus, tenderness, swelling or pain on movement.     Right Ear: Hearing, tympanic membrane, ear canal and external ear normal. There is no impacted cerumen.     Left Ear: Hearing, tympanic membrane, ear canal and external ear normal. There is no impacted cerumen.     Nose: Nose normal. No congestion or rhinorrhea.     Right Turbinates: Not enlarged, swollen or pale.     Left Turbinates: Not enlarged, swollen or pale.     Right Sinus: No maxillary sinus tenderness or frontal sinus tenderness.     Left Sinus: No maxillary sinus tenderness or frontal sinus tenderness.     Mouth/Throat:     Lips: Pink.     Mouth: Mucous membranes are moist. No injury.     Tongue: No lesions.     Pharynx: Oropharynx is clear. Uvula midline. No pharyngeal swelling, oropharyngeal exudate, posterior oropharyngeal erythema or uvula swelling.     Tonsils: No tonsillar exudate or tonsillar abscesses.  Eyes:     General: Lids are normal. Lids are everted, no foreign bodies appreciated. Vision grossly intact. Gaze aligned appropriately. No allergic shiner or visual field deficit.       Right eye: No discharge.        Left eye: No discharge.     Extraocular Movements: Extraocular movements intact.     Conjunctiva/sclera: Conjunctivae normal.     Right eye: Right conjunctiva is not injected. No exudate.    Left eye: Left conjunctiva  is not injected. No exudate.    Pupils: Pupils are equal, round, and reactive to light.  Neck:     Thyroid: No thyroid mass, thyromegaly or thyroid tenderness.     Vascular: No carotid bruit.     Trachea: Trachea normal.  Cardiovascular:     Rate and Rhythm: Normal rate and regular rhythm.     Pulses: Normal pulses.          Carotid pulses are 2+ on the right side and 2+ on the left side.      Radial pulses are 2+ on the right side and 2+ on the  left side.       Dorsalis pedis pulses are 2+ on the right side and 2+ on the left side.       Posterior tibial pulses are 2+ on the right side and 2+ on the left side.     Heart sounds: Normal heart sounds, S1 normal and S2 normal. No murmur heard.    No friction rub. No gallop.  Pulmonary:     Effort: Pulmonary effort is normal. No respiratory distress.     Breath sounds: Normal breath sounds and air entry. No stridor. No wheezing, rhonchi or rales.  Chest:     Chest wall: No tenderness.  Abdominal:     General: Abdomen is flat. Bowel sounds are normal. There is no distension.     Palpations: Abdomen is soft. There is no mass.     Tenderness: There is no abdominal tenderness. There is no right CVA tenderness, left CVA tenderness, guarding or rebound.     Hernia: No hernia is present.  Genitourinary:    Rectum: Tenderness and external hemorrhoid present.       Comments: Grade 2-3 hemorrhoids noted; with associated tenderness  No bleeding noted or reported  Musculoskeletal:        General: No swelling, tenderness, deformity or signs of injury. Normal range of motion.     Cervical back: Full passive range of motion without pain, normal range of motion and neck supple. No edema, rigidity or tenderness. No muscular tenderness.     Right lower leg: No edema.     Left lower leg: No edema.  Lymphadenopathy:     Cervical: No cervical adenopathy.     Right cervical: No superficial, deep or posterior cervical adenopathy.    Left cervical: No superficial, deep or posterior cervical adenopathy.  Skin:    General: Skin is warm and dry.     Capillary Refill: Capillary refill takes less than 2 seconds.     Coloration: Skin is not jaundiced or pale.     Findings: No bruising, erythema, lesion or rash.  Neurological:     General: No focal deficit present.     Mental Status: She is alert and oriented to person, place, and time. Mental status is at baseline.     GCS: GCS eye subscore is 4. GCS  verbal subscore is 5. GCS motor subscore is 6.     Sensory: Sensation is intact. No sensory deficit.     Motor: Motor function is intact. No weakness.     Coordination: Coordination is intact. Coordination normal.     Gait: Gait is intact. Gait normal.  Psychiatric:        Attention and Perception: Attention and perception normal.        Mood and Affect: Mood and affect normal.        Speech: Speech normal.  Behavior: Behavior normal. Behavior is cooperative.        Thought Content: Thought content normal.        Cognition and Memory: Cognition and memory normal.        Judgment: Judgment normal.     Last depression screening scores    10/23/2021    2:36 PM 08/11/2021    9:01 AM 06/02/2021    1:30 PM  PHQ 2/9 Scores  PHQ - 2 Score 0 0 0  PHQ- 9 Score  0    Last fall risk screening    02/17/2023    8:00 PM  Fall Risk   Falls in the past year? 0   Last Audit-C alcohol use screening    02/17/2023    8:01 PM  Alcohol Use Disorder Test (AUDIT)  1. How often do you have a drink containing alcohol? 0  3. How often do you have six or more drinks on one occasion? 0   A score of 3 or more in women, and 4 or more in men indicates increased risk for alcohol abuse, EXCEPT if all of the points are from question 1   No results found for any visits on 02/18/23.  Assessment & Plan    Routine Health Maintenance and Physical Exam  Exercise Activities and Dietary recommendations  Goals   None     Immunization History  Administered Date(s) Administered   Influenza,inj,Quad PF,6+ Mos 05/10/2017, 02/23/2018, 03/15/2020, 03/17/2021   Influenza,inj,quad, With Preservative 04/27/2019   Influenza-Unspecified 04/14/2017, 02/23/2018   PFIZER(Purple Top)SARS-COV-2 Vaccination 02/13/2020, 03/05/2020, 09/12/2020   Pneumococcal Conjugate-13 03/16/2018   Pneumococcal Polysaccharide-23 05/06/2012   Tdap 07/20/2017   Unspecified SARS-COV-2 Vaccination 04/07/2021    Health Maintenance   Topic Date Due   COVID-19 Vaccine (5 - 2023-24 season) 03/06/2022   MAMMOGRAM  05/21/2022   Colonoscopy  Never done   INFLUENZA VACCINE  10/04/2023 (Originally 02/04/2023)   PAP SMEAR-Modifier  03/05/2024   DTaP/Tdap/Td (2 - Td or Tdap) 07/21/2027   Hepatitis C Screening  Completed   HIV Screening  Completed   HPV VACCINES  Aged Out    Discussed health benefits of physical activity, and encouraged her to engage in regular exercise appropriate for her age and condition.  Problem List Items Addressed This Visit       Cardiovascular and Mediastinum   Grade III hemorrhoids    Acute, painful Samples of metamucil provided Suppositories ordered F/u as needed  Literature attached       Relevant Medications   hydrocortisone (ANUSOL-HC) 25 MG suppository   Other Relevant Orders   Ambulatory referral to Gastroenterology     Other   Annual physical exam - Primary    Declines tobacco cessation Things to do to keep yourself healthy  - Exercise at least 30-45 minutes a day, 3-4 days a week.  - Eat a low-fat diet with lots of fruits and vegetables, up to 7-9 servings per day.  - Seatbelts can save your life. Wear them always.  - Smoke detectors on every level of your home, check batteries every year.  - Eye Doctor - have an eye exam every 1-2 years  - Safe sex - if you may be exposed to STDs, use a condom.  - Alcohol -  If you drink, do it moderately, less than 2 drinks per day.  - Health Care Power of Attorney. Choose someone to speak for you if you are not able.  - Depression is common in our stressful world.If  you're feeling down or losing interest in things you normally enjoy, please come in for a visit.  - Violence - If anyone is threatening or hurting you, please call immediately.       Relevant Orders   CBC with Differential/Platelet   Comprehensive Metabolic Panel (CMET)   TSH + free T4   Lipid panel   Hemoglobin A1c   BMI 38.0-38.9,adult    Body mass index is 38.18  kg/m. Discussed importance of healthy weight management Discussed diet and exercise       Colon cancer screening    Due for initial colon cancer screening       Relevant Orders   Ambulatory referral to Gastroenterology   Tobacco abuse    Chronic, stable Remains pre-contemplative at this time iso cessation efforts       Return if symptoms worsen or fail to improve.    Leilani Merl, FNP, have reviewed all documentation for this visit. The documentation on 02/18/23 for the exam, diagnosis, procedures, and orders are all accurate and complete.  Jacky Kindle, FNP  [facility] [phone] (phone) [phone] (fax)  [facility]

## 2023-02-18 NOTE — Assessment & Plan Note (Signed)
Acute, painful Samples of metamucil provided Suppositories ordered F/u as needed  Literature attached

## 2023-02-19 ENCOUNTER — Other Ambulatory Visit: Payer: Self-pay

## 2023-02-19 ENCOUNTER — Telehealth: Payer: Self-pay

## 2023-02-19 DIAGNOSIS — Z1211 Encounter for screening for malignant neoplasm of colon: Secondary | ICD-10-CM

## 2023-02-19 LAB — COMPREHENSIVE METABOLIC PANEL
ALT: 16 IU/L (ref 0–32)
AST: 16 IU/L (ref 0–40)
Albumin: 3.9 g/dL (ref 3.9–4.9)
Alkaline Phosphatase: 71 IU/L (ref 44–121)
BUN/Creatinine Ratio: 14 (ref 9–23)
BUN: 11 mg/dL (ref 6–24)
Bilirubin Total: 0.3 mg/dL (ref 0.0–1.2)
CO2: 24 mmol/L (ref 20–29)
Calcium: 8.8 mg/dL (ref 8.7–10.2)
Chloride: 104 mmol/L (ref 96–106)
Creatinine, Ser: 0.77 mg/dL (ref 0.57–1.00)
Globulin, Total: 1.8 g/dL (ref 1.5–4.5)
Glucose: 70 mg/dL (ref 70–99)
Potassium: 3.6 mmol/L (ref 3.5–5.2)
Sodium: 141 mmol/L (ref 134–144)
Total Protein: 5.7 g/dL — ABNORMAL LOW (ref 6.0–8.5)
eGFR: 97 mL/min/{1.73_m2} (ref >59)

## 2023-02-19 LAB — CBC WITH DIFFERENTIAL/PLATELET
Basophils Absolute: 0.1 10*3/uL (ref 0.0–0.2)
Basos: 1 %
EOS (ABSOLUTE): 0.1 10*3/uL (ref 0.0–0.4)
Eos: 1 %
Hematocrit: 40.6 % (ref 34.0–46.6)
Hemoglobin: 13.5 g/dL (ref 11.1–15.9)
Immature Grans (Abs): 0.2 10*3/uL — ABNORMAL HIGH (ref 0.0–0.1)
Immature Granulocytes: 1 %
Lymphocytes Absolute: 4.5 10*3/uL — ABNORMAL HIGH (ref 0.7–3.1)
Lymphs: 31 %
MCH: 30.8 pg (ref 26.6–33.0)
MCHC: 33.3 g/dL (ref 31.5–35.7)
MCV: 93 fL (ref 79–97)
Monocytes Absolute: 1.2 10*3/uL — ABNORMAL HIGH (ref 0.1–0.9)
Monocytes: 8 %
Neutrophils Absolute: 8.7 10*3/uL — ABNORMAL HIGH (ref 1.4–7.0)
Neutrophils: 58 %
Platelets: 347 10*3/uL (ref 150–450)
RBC: 4.39 x10E6/uL (ref 3.77–5.28)
RDW: 13.5 % (ref 11.7–15.4)
WBC: 14.7 10*3/uL — ABNORMAL HIGH (ref 3.4–10.8)

## 2023-02-19 LAB — TSH+FREE T4
Free T4: 0.84 ng/dL (ref 0.82–1.77)
TSH: 1.81 u[IU]/mL (ref 0.450–4.500)

## 2023-02-19 LAB — HEMOGLOBIN A1C
Est. average glucose Bld gHb Est-mCnc: 111 mg/dL
Hgb A1c MFr Bld: 5.5 % (ref 4.8–5.6)

## 2023-02-19 LAB — LIPID PANEL
Chol/HDL Ratio: 2.4 ratio (ref 0.0–4.4)
Cholesterol, Total: 115 mg/dL (ref 100–199)
HDL: 47 mg/dL (ref >39)
LDL Chol Calc (NIH): 45 mg/dL (ref 0–99)
Triglycerides: 129 mg/dL (ref 0–149)
VLDL Cholesterol Cal: 23 mg/dL (ref 5–40)

## 2023-02-19 MED ORDER — NA SULFATE-K SULFATE-MG SULF 17.5-3.13-1.6 GM/177ML PO SOLN: 1.0000 | Freq: Once | ORAL | 0 refills | Status: AC

## 2023-02-19 NOTE — Telephone Encounter (Signed)
Gastroenterology Pre-Procedure Review  Request Date: 04/09/23 Requesting Physician: Dr. Allegra Lai  PATIENT REVIEW QUESTIONS: The patient responded to the following health history questions as indicated:    1. Are you having any GI issues? yes (referral notes Grade III hemorrhoids.  Screening colonoscopy 1st then office visit to be scheduled) 2. Do you have a personal history of Polyps? no 3. Do you have a family history of Colon Cancer or Polyps? no 4. Diabetes Mellitus? no 5. Joint replacements in the past 12 months?no 6. Major health problems in the past 3 months?no 7. Any artificial heart valves, MVP, or defibrillator?no    MEDICATIONS & ALLERGIES:    Patient reports the following regarding taking any anticoagulation/antiplatelet therapy:   Plavix, Coumadin, Eliquis, Xarelto, Lovenox, Pradaxa, Brilinta, or Effient? no Aspirin? no  Patient confirms/reports the following medications:  Current Outpatient Medications  Medication Sig Dispense Refill   acyclovir ointment (ZOVIRAX) 5 % Apply three times daily to cold sore until resolution 15 g 0   amitriptyline (ELAVIL) 75 MG tablet Take 1 tablet (75 mg total) by mouth at bedtime. 90 tablet 3   DULoxetine (CYMBALTA) 30 MG capsule Take 1 capsule by mouth once daily 90 capsule 0   Galcanezumab-gnlm (EMGALITY) 120 MG/ML SOAJ INJECT 120 MG SUBCUTANEOUSLY EVERY 30 DAYS. MUST BE SEEN FOR FURTHER REFILLS. CALL [phone]. 1 mL 0   hydrocortisone (ANUSOL-HC) 25 MG suppository Place 1 suppository (25 mg total) rectally 2 (two) times daily. 12 suppository 0   methocarbamol (ROBAXIN) 750 MG tablet Take 750 mg by mouth 3 (three) times daily as needed.     omeprazole (PRILOSEC) 40 MG capsule Take 1 capsule (40 mg total) by mouth daily. 90 capsule 3   oxyCODONE-acetaminophen (PERCOCET) 10-325 MG tablet Take 1 tablet by mouth every 4 (four) hours as needed for pain.     predniSONE (DELTASONE) 5 MG tablet Take 5 mg by mouth daily with breakfast.      rosuvastatin (CRESTOR) 5 MG tablet Take 1 tablet (5 mg total) by mouth daily. 90 tablet 3   No current facility-administered medications for this visit.    Patient confirms/reports the following allergies:  No Known Allergies  No orders of the defined types were placed in this encounter.   AUTHORIZATION INFORMATION Primary Insurance: 1D#: Group #:  Secondary Insurance: 1D#: Group #:  SCHEDULE INFORMATION: Date: 04/09/23 Time: Location: [facility]

## 2023-03-04 DIAGNOSIS — Z981 Arthrodesis status: Secondary | ICD-10-CM | POA: Diagnosis not present

## 2023-03-04 DIAGNOSIS — G8929 Other chronic pain: Secondary | ICD-10-CM | POA: Diagnosis not present

## 2023-03-04 DIAGNOSIS — M5416 Radiculopathy, lumbar region: Secondary | ICD-10-CM | POA: Diagnosis not present

## 2023-03-21 ENCOUNTER — Telehealth: Payer: Commercial Managed Care - HMO | Admitting: Nurse Practitioner

## 2023-03-21 DIAGNOSIS — B001 Herpesviral vesicular dermatitis: Secondary | ICD-10-CM | POA: Diagnosis not present

## 2023-03-21 MED ORDER — ACYCLOVIR 5 % EX OINT: TOPICAL_OINTMENT | CUTANEOUS | 0 refills | Status: AC

## 2023-03-21 NOTE — Progress Notes (Signed)
I have spent 5 minutes in review of e-visit questionnaire, review and updating patient chart, medical decision making and response to patient.  ° °Jerrell Mangel W Secilia Apps, NP ° °  °

## 2023-03-21 NOTE — Progress Notes (Signed)
We are sorry that you are not feeling well.  Here is how we plan to help!  Based on what you have shared with me it does look like you have a viral infection.    Most cold sores or fever blisters are small fluid filled blisters around the mouth caused by herpes simplex virus.  The most common strain of the virus causing cold sores is herpes simplex virus 1.  It can be spread by skin contact, sharing eating utensils, or even sharing towels.  Cold sores are contagious to other people until dry. (Approximately 5-7 days).  Wash your hands. You can spread the virus to your eyes through handling your contact lenses after touching the lesions.  Most people experience pain at the sight or tingling sensations in their lips that may begin before the ulcers erupt.  Herpes simplex is treatable but not curable.  It may lie dormant for a long time and then reappear due to stress or prolonged sun exposure.  Many patients have success in treating their cold sores with an over the counter topical called Abreva.  You may apply the cream up to 5 times daily (maximum 10 days) until healing occurs.  I have sent the cream as requested. I would also recommend following up with your PCP to make sure she is aware and you have refills on hand.     HOME CARE:  Wash your hands frequently. Do not pick at or rub the sore. Don't open the blisters. Avoid kissing other people during this time. Avoid sharing drinking glasses, eating utensils, or razors. Do not handle contact lenses unless you have thoroughly washed your hands with soap and warm water! Avoid oral sex during this time.  Herpes from sores on your mouth can spread to your partner's genital area. Avoid contact with anyone who has eczema or a weakened immune system. Cold sores are often triggered by exposure to intense sunlight, use a lip balm containing a sunscreen (SPF 30 or higher).  GET HELP RIGHT AWAY IF:  Blisters look infected. Blisters occur near or in  the eye. Symptoms last longer than 10 days. Your symptoms become worse.  MAKE SURE YOU:  Understand these instructions. Will watch your condition. Will get help right away if you are not doing well or get worse.    Your e-visit answers were reviewed by a board certified advanced clinical practitioner to complete your personal care plan.  Depending upon the condition, your plan could have  Included both over the counter or prescription medications.    Please review your pharmacy choice.  Be sure that the pharmacy you have chosen is open so that you can pick up your prescription now.  If there is a problem you can message your provider in MyChart to have the prescription routed to another pharmacy.    Your safety is important to [place].  If you have drug allergies check our prescription carefully.  For the next 24 hours you can use MyChart to ask questions about today's visit, request a non-urgent call back, or ask for a work or school excuse from your e-visit provider.  You will get an email in the next two days asking about your experience.  I hope that your e-visit has been valuable and will speed your recovery.

## 2023-04-09 ENCOUNTER — Other Ambulatory Visit: Payer: Self-pay

## 2023-04-09 ENCOUNTER — Encounter: Payer: Self-pay | Admitting: Certified Registered"

## 2023-04-09 ENCOUNTER — Ambulatory Visit
Admission: RE | Admit: 2023-04-09 | Payer: BLUE CROSS/BLUE SHIELD | Source: Ambulatory Visit | Admitting: Gastroenterology

## 2023-04-09 ENCOUNTER — Emergency Department (HOSPITAL_COMMUNITY): Payer: BLUE CROSS/BLUE SHIELD

## 2023-04-09 ENCOUNTER — Emergency Department (HOSPITAL_COMMUNITY)
Admission: EM | Admit: 2023-04-09 | Discharge: 2023-04-10 | Disposition: A | Discharge status: Home / Self Care | Payer: BLUE CROSS/BLUE SHIELD | Attending: Emergency Medicine | Admitting: Emergency Medicine

## 2023-04-09 DIAGNOSIS — F17210 Nicotine dependence, cigarettes, uncomplicated: Secondary | ICD-10-CM | POA: Diagnosis not present

## 2023-04-09 DIAGNOSIS — R059 Cough, unspecified: Secondary | ICD-10-CM | POA: Diagnosis present

## 2023-04-09 DIAGNOSIS — J168 Pneumonia due to other specified infectious organisms: Secondary | ICD-10-CM | POA: Diagnosis not present

## 2023-04-09 DIAGNOSIS — E876 Hypokalemia: Secondary | ICD-10-CM | POA: Diagnosis not present

## 2023-04-09 DIAGNOSIS — D72829 Elevated white blood cell count, unspecified: Secondary | ICD-10-CM | POA: Diagnosis not present

## 2023-04-09 DIAGNOSIS — Z20822 Contact with and (suspected) exposure to covid-19: Secondary | ICD-10-CM | POA: Diagnosis not present

## 2023-04-09 DIAGNOSIS — Z720 Tobacco use: Secondary | ICD-10-CM

## 2023-04-09 DIAGNOSIS — J189 Pneumonia, unspecified organism: Secondary | ICD-10-CM

## 2023-04-09 LAB — CBC
HCT: 37.2 % (ref 36.0–46.0)
Hemoglobin: 13.1 g/dL (ref 12.0–15.0)
MCH: 30.5 pg (ref 26.0–34.0)
MCHC: 35.2 g/dL (ref 30.0–36.0)
MCV: 86.5 fL (ref 80.0–100.0)
Platelets: 373 10*3/uL (ref 150–400)
RBC: 4.3 MIL/uL (ref 3.87–5.11)
RDW: 13.2 % (ref 11.5–15.5)
WBC: 14.6 10*3/uL — ABNORMAL HIGH (ref 4.0–10.5)
nRBC: 0 % (ref 0.0–0.2)

## 2023-04-09 LAB — BASIC METABOLIC PANEL
Anion gap: 11 (ref 5–15)
BUN: 8 mg/dL (ref 6–20)
CO2: 29 mmol/L (ref 22–32)
Calcium: 8.3 mg/dL — ABNORMAL LOW (ref 8.9–10.3)
Chloride: 95 mmol/L — ABNORMAL LOW (ref 98–111)
Creatinine, Ser: 0.72 mg/dL (ref 0.44–1.00)
GFR, Estimated: >60 mL/min (ref >60)
Glucose, Bld: 111 mg/dL — ABNORMAL HIGH (ref 70–99)
Potassium: 2.6 mmol/L — CL (ref 3.5–5.1)
Sodium: 135 mmol/L (ref 135–145)

## 2023-04-09 LAB — RESP PANEL BY RT-PCR (RSV, FLU A&B, COVID)  RVPGX2
Influenza A by PCR: NEGATIVE
Influenza B by PCR: NEGATIVE
Resp Syncytial Virus by PCR: NEGATIVE
SARS Coronavirus 2 by RT PCR: NEGATIVE

## 2023-04-09 SURGERY — COLONOSCOPY WITH PROPOFOL
Anesthesia: General

## 2023-04-09 MED ORDER — POTASSIUM CHLORIDE 20 MEQ PO PACK
40.0000 meq | PACK | Freq: Once | ORAL | Status: AC
  Administered 2023-04-09: 40 meq via ORAL
  Filled 2023-04-09: qty 2

## 2023-04-09 MED ORDER — SODIUM CHLORIDE 0.9 % IV SOLN
1.0000 g | Freq: Once | INTRAVENOUS | Status: AC
  Administered 2023-04-09: 1 g via INTRAVENOUS
  Filled 2023-04-09: qty 10

## 2023-04-09 MED ORDER — ALBUTEROL SULFATE HFA 108 (90 BASE) MCG/ACT IN AERS
1.0000 | INHALATION_SPRAY | Freq: Once | RESPIRATORY_TRACT | Status: AC
  Administered 2023-04-09: 1 via RESPIRATORY_TRACT
  Filled 2023-04-09: qty 6.7

## 2023-04-09 MED ORDER — POTASSIUM CHLORIDE CRYS ER 20 MEQ PO TBCR: 20.0000 meq | EXTENDED_RELEASE_TABLET | Freq: Two times a day (BID) | ORAL | 0 refills | Status: AC

## 2023-04-09 MED ORDER — SODIUM CHLORIDE 0.9 % IV SOLN: 500.0000 mg | INTRAVENOUS | Status: DC

## 2023-04-09 MED ORDER — ACETAMINOPHEN 325 MG PO TABS
650.0000 mg | ORAL_TABLET | Freq: Once | ORAL | Status: AC
  Administered 2023-04-09: 650 mg via ORAL
  Filled 2023-04-09: qty 2

## 2023-04-09 MED ORDER — POTASSIUM CHLORIDE 10 MEQ/100ML IV SOLN
10.0000 meq | INTRAVENOUS | Status: AC
  Administered 2023-04-09 (×2): 10 meq via INTRAVENOUS
  Filled 2023-04-09 (×2): qty 100

## 2023-04-09 MED ORDER — DOXYCYCLINE HYCLATE 100 MG PO CAPS: 100.0000 mg | ORAL_CAPSULE | Freq: Two times a day (BID) | ORAL | 0 refills | Status: DC

## 2023-04-09 NOTE — Discharge Instructions (Addendum)
Please take the entire course of antibiotics that I prescribed, as well as the potassium I prescribed twice daily until the prescription runs out.  Please follow-up with your primary care doctor for recheck of your pneumonia as well as potassium at your earliest convenience.  As we discussed I really recommended that you be admitted to the hospital, since you are requesting discharge at this time, please return at any sign of worsening breathing, fever not responding to ibuprofen, Tylenol, dizziness, lightheadedness or other worsening symptoms.

## 2023-04-09 NOTE — ED Notes (Signed)
Critical value-  Potassium 2.6 reported to EDP and primary RN at this time. See orders

## 2023-04-09 NOTE — ED Provider Notes (Signed)
[place] EMERGENCY DEPARTMENT AT [facility] Provider Note   CSN: [id number] Arrival date & time: 04/09/23  1943     History  Chief Complaint  Patient presents with   Cough   Generalized Body Aches   Fever    Pt presents to ED complaining of cough, fever, body aches, SOB. Pt states temp of 103 @ home PTA    April Ayala is a 47 y.o. female past medical history significant for chronic pain, tobacco abuse, she reports 1 pack of cigarettes per day, obstructive sleep apnea, anxiety, depression, obesity who presents concern for cough, fever, body aches, shortness of breath that started yesterday.  Patient reports that she was recently exposed to COVID.   Cough Associated symptoms: fever   Fever Associated symptoms: cough        Home Medications Prior to Admission medications   Medication Sig Start Date End Date Taking? Authorizing Provider  doxycycline (VIBRAMYCIN) 100 MG capsule Take 1 capsule (100 mg total) by mouth 2 (two) times daily. 04/09/23  Yes Tarrie Mcmichen H, PA-C  potassium chloride SA (KLOR-CON M) 20 MEQ tablet Take 1 tablet (20 mEq total) by mouth 2 (two) times daily. 04/09/23  Yes Levis Nazir H, PA-C  acyclovir ointment (ZOVIRAX) 5 % Apply three times daily to cold sore until resolution 03/21/23   Claiborne Rigg, NP  amitriptyline (ELAVIL) 75 MG tablet Take 1 tablet (75 mg total) by mouth at bedtime. 10/20/22   Jacky Kindle, FNP  DULoxetine (CYMBALTA) 30 MG capsule Take 1 capsule by mouth once daily 02/03/23   Merita Norton T, FNP  Galcanezumab-gnlm (EMGALITY) 120 MG/ML SOAJ INJECT 120 MG SUBCUTANEOUSLY EVERY 30 DAYS. MUST BE SEEN FOR FURTHER REFILLS. CALL [phone]. 06/22/22   Butch Penny, NP  hydrocortisone (ANUSOL-HC) 25 MG suppository Place 1 suppository (25 mg total) rectally 2 (two) times daily. 02/18/23   Jacky Kindle, FNP  methocarbamol (ROBAXIN) 750 MG tablet Take 750 mg by mouth 3 (three) times daily as needed. 11/02/21    [provider]  omeprazole (PRILOSEC) 40 MG capsule Take 1 capsule (40 mg total) by mouth daily. 01/13/23   Jacky Kindle, FNP  oxyCODONE-acetaminophen (PERCOCET) 10-325 MG tablet Take 1 tablet by mouth every 4 (four) hours as needed for pain. 07/07/21   [provider]  predniSONE (DELTASONE) 5 MG tablet Take 5 mg by mouth daily with breakfast.    [provider]  rosuvastatin (CRESTOR) 5 MG tablet Take 1 tablet (5 mg total) by mouth daily. 01/13/23   Jacky Kindle, FNP      Allergies    Patient has no known allergies.    Review of Systems   Review of Systems  Constitutional:  Positive for fever.  Respiratory:  Positive for cough.   All other systems reviewed and are negative.   Physical Exam Updated Vital Signs BP 113/65   Pulse 94   Temp 99.6 F (37.6 C) (Oral)   Resp (!) 25   Ht 5' (1.524 m)   Wt 88.7 kg   LMP 04/11/2012   SpO2 90%   BMI 38.19 kg/m  Physical Exam Vitals and nursing note reviewed.  Constitutional:      General: She is not in acute distress.    Appearance: Normal appearance.  HENT:     Head: Normocephalic and atraumatic.  Eyes:     General:        Right eye: No discharge.  Left eye: No discharge.  Cardiovascular:     Rate and Rhythm: Normal rate and regular rhythm.     Heart sounds: No murmur heard.    No friction rub. No gallop.  Pulmonary:     Effort: Pulmonary effort is normal.     Breath sounds: Normal breath sounds.     Comments: Coarse rhonchi throughout bilateral lung fields, no significant focal consolidation noted, mild wheezing throughout, increased work of breathing, shallow respiration Abdominal:     General: Bowel sounds are normal.     Palpations: Abdomen is soft.  Skin:    General: Skin is warm and dry.     Capillary Refill: Capillary refill takes less than 2 seconds.  Neurological:     Mental Status: She is alert and oriented to person, place, and time.  Psychiatric:        Mood and Affect:  Mood normal.        Behavior: Behavior normal.     ED Results / Procedures / Treatments   Labs (all labs ordered are listed, but only abnormal results are displayed) Labs Reviewed  CBC - Abnormal; Notable for the following components:      Result Value   WBC 14.6 (*)    All other components within normal limits  BASIC METABOLIC PANEL - Abnormal; Notable for the following components:   Potassium 2.6 (*)    Chloride 95 (*)    Glucose, Bld 111 (*)    Calcium 8.3 (*)    All other components within normal limits  RESP PANEL BY RT-PCR (RSV, FLU A&B, COVID)  [id number]    EKG None  Radiology No results found.  Procedures Procedures    Medications Ordered in ED Medications  potassium chloride 10 mEq in 100 mL IVPB (10 mEq Intravenous New Bag/Given 04/09/23 2321)  cefTRIAXone (ROCEPHIN) 1 g in sodium chloride 0.9 % 100 mL IVPB (1 g Intravenous New Bag/Given 04/09/23 2324)  azithromycin (ZITHROMAX) 500 mg in sodium chloride 0.9 % 250 mL IVPB (has no administration in time range)  albuterol (VENTOLIN HFA) 108 (90 Base) MCG/ACT inhaler 1 puff (1 puff Inhalation Given 04/09/23 2126)  acetaminophen (TYLENOL) tablet 650 mg (650 mg Oral Given 04/09/23 2125)  potassium chloride (KLOR-CON) packet 40 mEq (40 mEq Oral Given 04/09/23 2220)    ED Course/ Medical Decision Making/ A&P                                 Medical Decision Making Amount and/or Complexity of Data Reviewed Labs: ordered. Radiology: ordered.  Risk OTC drugs. Prescription drug management.   This patient is a 47 y.o. female  who presents to the ED for concern of shortness of breath, cough, fever, body aches.   Differential diagnoses prior to evaluation: The emergent differential diagnosis includes, but is not limited to, viral illness asthma exacerbation, COPD exacerbation, acute upper respiratory infection, acute bronchitis, chronic bronchitis, interstitial lung disease, ARDS, PE, pneumonia, atypical ACS, carbon  monoxide poisoning, spontaneous pneumothorax, new CHF vs CHF exacerbation, versus other. This is not an exhaustive differential.   Past Medical History / Co-morbidities / Social History: chronic pain, tobacco abuse, she reports 1 pack of cigarettes per day, obstructive sleep apnea, anxiety, depression, obesity  Additional history: Chart reviewed. Pertinent results include: Reviewed lab work, imaging from outpatient family medicine, gastroenterology visits, no recent emergency department visits on file  Physical Exam: Physical exam performed. The pertinent findings  include: Patient with some coarse rhonchi, and mild wheezing.  She arrives with elevated temperature, 99.6.  Initially hypoxic, oxygen saturation 89% on room air with 24 respirations per minute.  Some increased work of breathing without overt respiratory distress  Lab Tests/Imaging studies: I personally interpreted labs/imaging and the pertinent results include: Significant leukocytosis, white blood cells 14.6, BMP notable for fairly severe hypokalemia, potassium 2.6, otherwise unremarkable, RVP negative for COVID, flu, RSV.  I dependently interpreted plain film chest x-ray, suspicion for atypical pneumonia, versus malignancy, versus other abnormality with some congestion noted especially around the hila bilaterally, left greater than right..  Radiologist interpretation pending at this time.  Medications: I ordered medication including oral and IV potassium for significant hypokalemia, albuterol for some wheezing, rhonchi, Tylenol for fever, Rocephin, azithromycin due to suspected pneumonia, hypoxia.  I have reviewed the patients home medicines and have made adjustments as needed.  Discussed smoking cessation with patient and was they were offerred resources to help stop.  Total time was 5 min CPT code 16109.    Initially consulted with the hospitalist for admission for acute hypoxic respiratory failure with pneumonia, as well as  significant hypokalemia, however patient is requesting discharge.  Her oxygen saturation has improved on reevaluation she is able to maintain greater than 92% on room air.  Discussed the risks of leaving including worsening pneumonia, hypokalemia.  Will discharge with potassium, antibiotics for pneumonia, and encourage close follow-up.  Discussed extensive return precautions.  Final Clinical Impression(s) / ED Diagnoses Final diagnoses:  Pneumonia of both lungs due to infectious organism, unspecified part of lung  Hypokalemia  Tobacco abuse    Rx / DC Orders ED Discharge Orders          Ordered    potassium chloride SA (KLOR-CON M) 20 MEQ tablet  2 times daily        04/09/23 2330    doxycycline (VIBRAMYCIN) 100 MG capsule  2 times daily        04/09/23 2330              Damesha Lawler, Edyth Gunnels 04/09/23 2332    Terrilee Files, MD 04/10/23 1009

## 2023-04-09 NOTE — ED Triage Notes (Addendum)
Pt presents to ED complaining of cough, fever, body aches, SOB. Pt states temp of 103 @ home PTA. Also endorses lightheaded when walking.

## 2023-04-11 ENCOUNTER — Emergency Department (HOSPITAL_COMMUNITY)
Admission: EM | Admit: 2023-04-11 | Discharge: 2023-04-11 | Disposition: A | Discharge status: Home / Self Care | Payer: BLUE CROSS/BLUE SHIELD | Attending: Emergency Medicine | Admitting: Emergency Medicine

## 2023-04-11 ENCOUNTER — Encounter (HOSPITAL_COMMUNITY): Payer: Self-pay | Admitting: Emergency Medicine

## 2023-04-11 ENCOUNTER — Other Ambulatory Visit: Payer: Self-pay

## 2023-04-11 DIAGNOSIS — J189 Pneumonia, unspecified organism: Secondary | ICD-10-CM | POA: Insufficient documentation

## 2023-04-11 DIAGNOSIS — R0602 Shortness of breath: Secondary | ICD-10-CM | POA: Diagnosis present

## 2023-04-11 MED ORDER — DOXYCYCLINE HYCLATE 100 MG PO TABS
100.0000 mg | ORAL_TABLET | Freq: Once | ORAL | Status: AC
  Administered 2023-04-11: 100 mg via ORAL
  Filled 2023-04-11: qty 1

## 2023-04-11 NOTE — ED Provider Notes (Signed)
[place] EMERGENCY DEPARTMENT AT [facility] Provider Note   CSN: [id number] Arrival date & time: 04/11/23  0410     History  Chief Complaint  Patient presents with   Shortness of Breath    April Ayala is a 47 y.o. female.  49-year-old female who presents ER today with feeling abnormal.  She told nursing that she was short of breath.  Patient was here yesterday secondary to shortness of breath, fever and cough.  She is mildly hypoxic and tachypneic and is found to have pneumonia.  She was given antibiotics and was still relatively hypoxic and had plan for admission but patient wanted to leave.  She was subsequently discharged after shared decision making between her and the previous provider.  Did not get her antibiotics or other medications filled yesterday as she did have elevated They come up or give her a ride.  She states that she was okay today then when she would lay down she felt kind of abnormal like she might be short of breath.  She called EMS but by time he got there she felt back to her baseline.  She is felt her baseline whole ride here.  She now states that she is not sure why she came as she feels fine.  She states that she thinks that she should build to get her sister to give her a ride to get her medicines today.   Shortness of Breath      Home Medications Prior to Admission medications   Medication Sig Start Date End Date Taking? Authorizing Provider  acyclovir ointment (ZOVIRAX) 5 % Apply three times daily to cold sore until resolution 03/21/23   Claiborne Rigg, NP  amitriptyline (ELAVIL) 75 MG tablet Take 1 tablet (75 mg total) by mouth at bedtime. 10/20/22   Jacky Kindle, FNP  doxycycline (VIBRAMYCIN) 100 MG capsule Take 1 capsule (100 mg total) by mouth 2 (two) times daily. 04/09/23   Prosperi, Christian H, PA-C  DULoxetine (CYMBALTA) 30 MG capsule Take 1 capsule by mouth once daily 02/03/23   Merita Norton T, FNP  Galcanezumab-gnlm (EMGALITY)  120 MG/ML SOAJ INJECT 120 MG SUBCUTANEOUSLY EVERY 30 DAYS. MUST BE SEEN FOR FURTHER REFILLS. CALL [phone]. 06/22/22   Butch Penny, NP  hydrocortisone (ANUSOL-HC) 25 MG suppository Place 1 suppository (25 mg total) rectally 2 (two) times daily. 02/18/23   Jacky Kindle, FNP  methocarbamol (ROBAXIN) 750 MG tablet Take 750 mg by mouth 3 (three) times daily as needed. 11/02/21   [provider]  omeprazole (PRILOSEC) 40 MG capsule Take 1 capsule (40 mg total) by mouth daily. 01/13/23   Jacky Kindle, FNP  oxyCODONE-acetaminophen (PERCOCET) 10-325 MG tablet Take 1 tablet by mouth every 4 (four) hours as needed for pain. 07/07/21   [provider]  potassium chloride SA (KLOR-CON M) 20 MEQ tablet Take 1 tablet (20 mEq total) by mouth 2 (two) times daily. 04/09/23   Prosperi, Christian H, PA-C  predniSONE (DELTASONE) 5 MG tablet Take 5 mg by mouth daily with breakfast.    [provider]  rosuvastatin (CRESTOR) 5 MG tablet Take 1 tablet (5 mg total) by mouth daily. 01/13/23   Jacky Kindle, FNP      Allergies    Patient has no known allergies.    Review of Systems   Review of Systems  Respiratory:  Positive for shortness of breath.     Physical Exam Updated Vital Signs BP 113/74 (BP Location:  Left Arm)   Pulse 89   Temp 98.9 F (37.2 C) (Oral)   Resp 19   Ht 5' (1.524 m)   Wt 89 kg   LMP 04/11/2012   SpO2 97%   BMI 38.32 kg/m  Physical Exam Vitals and nursing note reviewed.  Constitutional:      Appearance: She is well-developed.  HENT:     Head: Normocephalic and atraumatic.  Cardiovascular:     Rate and Rhythm: Normal rate and regular rhythm.  Pulmonary:     Effort: No respiratory distress.     Breath sounds: No stridor. Wheezing present. No decreased breath sounds or rhonchi.  Abdominal:     General: There is no distension.  Musculoskeletal:     Cervical back: Normal range of motion.     Right lower leg: No edema.     Left lower leg: No  edema.  Skin:    General: Skin is warm and dry.  Neurological:     Mental Status: She is alert.     ED Results / Procedures / Treatments   Labs (all labs ordered are listed, but only abnormal results are displayed) Labs Reviewed - No data to display  EKG None  Radiology DG Chest 2 View  Result Date: 04/09/2023 CLINICAL DATA:  Shortness of breath, cough. EXAM: CHEST - 2 VIEW COMPARISON:  04/17/2021 FINDINGS: The heart is normal in size. Mild ill-defined and interstitial opacities in both lungs, left greater than right. No pleural effusion or pneumothorax. No acute osseous findings. IMPRESSION: Mild ill-defined and interstitial opacities in both lungs, left greater than right, suspicious for multifocal pneumonia, including atypical infection. Electronically Signed   By: Narda Rutherford M.D.   On: 04/09/2023 23:30    Procedures Procedures    Medications Ordered in ED Medications  doxycycline (VIBRA-TABS) tablet 100 mg (100 mg Oral Given 04/11/23 0445)    ED Course/ Medical Decision Making/ A&P                                 Medical Decision Making Risk Prescription drug management.   Vital signs better than yesterday.  Patient feels the same as she did yesterday.  Give her dose of antibiotics.  She has a way to get her antibiotics today and we will see any indication to repeat any workup as she feels okay.  Final Clinical Impression(s) / ED Diagnoses Final diagnoses:  Community acquired pneumonia, unspecified laterality    Rx / DC Orders ED Discharge Orders     None         Duan Scharnhorst, Barbara Cower, MD 04/11/23 [phone]

## 2023-04-11 NOTE — ED Triage Notes (Signed)
Pt bib EMS for c/o sob. States she was seen here on Friday and discharged home with dx of PNA.

## 2023-06-02 DIAGNOSIS — G8929 Other chronic pain: Secondary | ICD-10-CM | POA: Diagnosis not present

## 2023-06-02 DIAGNOSIS — M7062 Trochanteric bursitis, left hip: Secondary | ICD-10-CM | POA: Diagnosis not present

## 2023-06-02 DIAGNOSIS — Z981 Arthrodesis status: Secondary | ICD-10-CM | POA: Diagnosis not present

## 2023-07-09 ENCOUNTER — Telehealth: Payer: Commercial Managed Care - HMO | Admitting: Family Medicine

## 2023-07-09 DIAGNOSIS — Z202 Contact with and (suspected) exposure to infections with a predominantly sexual mode of transmission: Secondary | ICD-10-CM

## 2023-07-09 NOTE — Progress Notes (Signed)
 April Ayala,  You will need to be seen in person for STI testing and treatment.    I feel your condition warrants further evaluation and I recommend that you be seen for a face to face visit.  Please contact your primary care physician practice to be seen. Many offices offer virtual options to be seen via video if you are not comfortable going in person to a medical facility at this time.  NOTE: You will NOT be charged for this eVisit.  If you do not have a PCP, [facility] offers a free physician referral service available at [phone]. Our trained staff has the experience, knowledge and resources to put you in touch with a physician who is right for you.    If you are having a true medical emergency please call 911.   Your e-visit answers were reviewed by a board certified advanced clinical practitioner to complete your personal care plan.  Thank you for using e-Visits.

## 2023-07-12 ENCOUNTER — Emergency Department (HOSPITAL_COMMUNITY): Payer: Commercial Managed Care - HMO

## 2023-07-12 ENCOUNTER — Other Ambulatory Visit: Payer: Self-pay

## 2023-07-12 ENCOUNTER — Emergency Department (HOSPITAL_COMMUNITY)
Admission: EM | Admit: 2023-07-12 | Discharge: 2023-07-12 | Disposition: A | Discharge status: Home / Self Care | Payer: Commercial Managed Care - HMO | Attending: Emergency Medicine | Admitting: Emergency Medicine

## 2023-07-12 ENCOUNTER — Encounter (HOSPITAL_COMMUNITY): Payer: Self-pay | Admitting: *Deleted

## 2023-07-12 DIAGNOSIS — Z202 Contact with and (suspected) exposure to infections with a predominantly sexual mode of transmission: Secondary | ICD-10-CM | POA: Diagnosis not present

## 2023-07-12 DIAGNOSIS — R079 Chest pain, unspecified: Secondary | ICD-10-CM | POA: Diagnosis not present

## 2023-07-12 DIAGNOSIS — R55 Syncope and collapse: Secondary | ICD-10-CM | POA: Diagnosis not present

## 2023-07-12 DIAGNOSIS — R0789 Other chest pain: Secondary | ICD-10-CM | POA: Diagnosis not present

## 2023-07-12 LAB — CBC
HCT: 42.1 % (ref 36.0–46.0)
Hemoglobin: 14 g/dL (ref 12.0–15.0)
MCH: 29.8 pg (ref 26.0–34.0)
MCHC: 33.3 g/dL (ref 30.0–36.0)
MCV: 89.6 fL (ref 80.0–100.0)
Platelets: 400 10*3/uL (ref 150–400)
RBC: 4.7 MIL/uL (ref 3.87–5.11)
RDW: 13.8 % (ref 11.5–15.5)
WBC: 10 10*3/uL (ref 4.0–10.5)
nRBC: 0 % (ref 0.0–0.2)

## 2023-07-12 LAB — BASIC METABOLIC PANEL
Anion gap: 10 (ref 5–15)
BUN: 12 mg/dL (ref 6–20)
CO2: 24 mmol/L (ref 22–32)
Calcium: 8.7 mg/dL — ABNORMAL LOW (ref 8.9–10.3)
Chloride: 104 mmol/L (ref 98–111)
Creatinine, Ser: 0.94 mg/dL (ref 0.44–1.00)
GFR, Estimated: >60 mL/min (ref >60)
Glucose, Bld: 107 mg/dL — ABNORMAL HIGH (ref 70–99)
Potassium: 3.4 mmol/L — ABNORMAL LOW (ref 3.5–5.1)
Sodium: 138 mmol/L (ref 135–145)

## 2023-07-12 LAB — URINALYSIS, ROUTINE W REFLEX MICROSCOPIC
Bilirubin Urine: NEGATIVE
Glucose, UA: NEGATIVE mg/dL
Hgb urine dipstick: NEGATIVE
Ketones, ur: NEGATIVE mg/dL
Leukocytes,Ua: NEGATIVE
Nitrite: NEGATIVE
Protein, ur: NEGATIVE mg/dL
Specific Gravity, Urine: 1.009 (ref 1.005–1.030)
pH: 6 (ref 5.0–8.0)

## 2023-07-12 LAB — D-DIMER, QUANTITATIVE: D-Dimer, Quant: 0.3 ug{FEU}/mL (ref 0.00–0.50)

## 2023-07-12 LAB — TROPONIN I (HIGH SENSITIVITY)
Troponin I (High Sensitivity): 3 ng/L (ref <18)
Troponin I (High Sensitivity): 3 ng/L (ref <18)

## 2023-07-12 LAB — PREGNANCY, URINE: Preg Test, Ur: NEGATIVE

## 2023-07-12 MED ORDER — ACETAMINOPHEN 325 MG PO TABS
650.0000 mg | ORAL_TABLET | Freq: Once | ORAL | Status: AC
  Administered 2023-07-12: 650 mg via ORAL
  Filled 2023-07-12: qty 2

## 2023-07-12 MED ORDER — DOXYCYCLINE HYCLATE 100 MG PO CAPS: 100.0000 mg | ORAL_CAPSULE | Freq: Two times a day (BID) | ORAL | 0 refills | Status: DC

## 2023-07-12 MED ORDER — STERILE WATER FOR INJECTION IJ SOLN
INTRAMUSCULAR | Status: AC
  Filled 2023-07-12: qty 10

## 2023-07-12 MED ORDER — CEFTRIAXONE SODIUM 500 MG IJ SOLR
500.0000 mg | Freq: Once | INTRAMUSCULAR | Status: AC
  Administered 2023-07-12: 500 mg via INTRAMUSCULAR
  Filled 2023-07-12: qty 500

## 2023-07-12 NOTE — ED Provider Notes (Addendum)
 [place] EMERGENCY DEPARTMENT AT [facility] Provider Note   CSN: [id number] Arrival date & time: 07/12/23  1623     History  Chief Complaint  Patient presents with   Chest Pain    April Ayala is a 48 y.o. female.   Chest Pain Associated symptoms: headache and nausea   Associated symptoms: no abdominal pain, no back pain, no cough, no dizziness, no fever, no numbness, no shortness of breath, no vomiting and no weakness         April Ayala is a 48 y.o. female chronic headaches, depression, anxiety who presents to the Emergency Department, brought in by EMS for syncopal episode and chest pain.  States that she did not feel well yesterday woke up today having continued fatigue.  Had a brief episode while doing housework earlier today of syncope.  Unable to determine specific duration but states episode was brief.  Began having sharp to dull stabbing left-sided chest pain shortly after the syncopal episode.  She has had intermittent episodes where the pain would radiate down her left arm.  No history of cardiac disease.  Also patient states that she has been stressed recently, and is requesting to be tested for STI.  States she had recent unprotected intercourse with partner who has something and she would like to be tested for STI's.  Increased vaginal discharge and burning with urination. No abnormal vaginal bleeding   Home Medications Prior to Admission medications   Medication Sig Start Date End Date Taking? Authorizing Provider  acyclovir  ointment (ZOVIRAX ) 5 % Apply three times daily to cold sore until resolution 03/21/23   Freelove, Zelda W, NP  amitriptyline  (ELAVIL ) 75 MG tablet Take 1 tablet (75 mg total) by mouth at bedtime. 10/20/22   Emilio Kelly DASEN, FNP  doxycycline  (VIBRAMYCIN ) 100 MG capsule Take 1 capsule (100 mg total) by mouth 2 (two) times daily. 04/09/23   Prosperi, Christian H, PA-C  DULoxetine  (CYMBALTA ) 30 MG capsule Take 1 capsule by  mouth once daily 02/03/23   Emilio Kelly T, FNP  Galcanezumab -gnlm (EMGALITY ) 120 MG/ML SOAJ INJECT 120 MG SUBCUTANEOUSLY EVERY 30 DAYS. MUST BE SEEN FOR FURTHER REFILLS. CALL [phone]. 06/22/22   Sherryl Bouchard, NP  hydrocortisone  (ANUSOL -HC) 25 MG suppository Place 1 suppository (25 mg total) rectally 2 (two) times daily. 02/18/23   Emilio Kelly DASEN, FNP  methocarbamol  (ROBAXIN ) 750 MG tablet Take 750 mg by mouth 3 (three) times daily as needed. 11/02/21   [provider]  omeprazole  (PRILOSEC) 40 MG capsule Take 1 capsule (40 mg total) by mouth daily. 01/13/23   Emilio Kelly DASEN, FNP  oxyCODONE -acetaminophen  (PERCOCET) 10-325 MG tablet Take 1 tablet by mouth every 4 (four) hours as needed for pain. 07/07/21   [provider]  potassium chloride  SA (KLOR-CON  M) 20 MEQ tablet Take 1 tablet (20 mEq total) by mouth 2 (two) times daily. 04/09/23   Prosperi, Christian H, PA-C  predniSONE  (DELTASONE ) 5 MG tablet Take 5 mg by mouth daily with breakfast.    [provider]  rosuvastatin  (CRESTOR ) 5 MG tablet Take 1 tablet (5 mg total) by mouth daily. 01/13/23   Emilio Kelly DASEN, FNP      Allergies    Patient has no known allergies.    Review of Systems   Review of Systems  Constitutional:  Negative for chills and fever.  HENT:  Negative for sore throat.   Respiratory:  Negative for cough, chest tightness and shortness of breath.  Cardiovascular:  Positive for chest pain.  Gastrointestinal:  Positive for nausea. Negative for abdominal pain and vomiting.  Genitourinary:  Positive for dysuria and vaginal discharge. Negative for decreased urine volume, difficulty urinating, flank pain, frequency, hematuria, urgency and vaginal bleeding.  Musculoskeletal:  Negative for back pain.  Skin:  Negative for rash.  Neurological:  Positive for syncope and headaches. Negative for dizziness, seizures, weakness and numbness.    Physical Exam Updated Vital Signs BP 117/80   Pulse (!) 101    Temp 98.2 F (36.8 C) (Oral)   Resp (!) 23   Ht 5' (1.524 m)   Wt 89 kg   LMP 04/11/2012   SpO2 94%   BMI 38.32 kg/m  Physical Exam Vitals and nursing note reviewed.  Constitutional:      General: She is not in acute distress.    Appearance: She is well-developed. She is not ill-appearing or toxic-appearing.  Cardiovascular:     Rate and Rhythm: Normal rate and regular rhythm.     Pulses: Normal pulses.  Pulmonary:     Effort: Pulmonary effort is normal.  Abdominal:     Palpations: Abdomen is soft.     Tenderness: There is no abdominal tenderness. There is no right CVA tenderness or left CVA tenderness.  Musculoskeletal:        General: Normal range of motion.     Right lower leg: No edema.     Left lower leg: No edema.  Lymphadenopathy:     Cervical: No cervical adenopathy.  Skin:    General: Skin is warm.     Capillary Refill: Capillary refill takes less than 2 seconds.  Neurological:     General: No focal deficit present.     Mental Status: She is alert.     Sensory: No sensory deficit.     Motor: No weakness.     ED Results / Procedures / Treatments   Labs (all labs ordered are listed, but only abnormal results are displayed) Labs Reviewed  BASIC METABOLIC PANEL - Abnormal; Notable for the following components:      Result Value   Potassium 3.4 (*)    Glucose, Bld 107 (*)    Calcium  8.7 (*)    All other components within normal limits  URINALYSIS, ROUTINE W REFLEX MICROSCOPIC - Abnormal; Notable for the following components:   APPearance CLOUDY (*)    All other components within normal limits  CBC  PREGNANCY, URINE  D-DIMER, QUANTITATIVE  GC/CHLAMYDIA PROBE AMP ([facility]) NOT AT [facility]  TROPONIN I (HIGH SENSITIVITY)  TROPONIN I (HIGH SENSITIVITY)    EKG EKG Interpretation Date/Time:  Monday July 12 2023 16:41:02 EST Ventricular Rate:  98 PR Interval:  120 QRS Duration:  86 QT Interval:  348 QTC Calculation: 445 R Axis:   78  Text  Interpretation: Sinus rhythm Right atrial enlargement Confirmed by Francesca Fallow ([id number]) on 07/12/2023 7:55:42 PM  Radiology DG Chest Portable 1 View Result Date: 07/12/2023 CLINICAL DATA:  Chest pain.  Near syncope. EXAM: PORTABLE CHEST 1 VIEW COMPARISON:  04/09/2023 and CT chest 08/03/2021. FINDINGS: Trachea is midline. Heart size within normal limits. Lungs are clear. No pleural fluid. IMPRESSION: No acute findings. Electronically Signed   By: Newell Eke M.D.   On: 07/12/2023 17:48    Procedures Procedures    Medications Ordered in ED Medications - No data to display  ED Course/ Medical Decision Making/ A&P Clinical Course as of 07/12/23 2235  Mon Jul 12, 2023  2226 Patient with syncope, cp afte syncope- waiting for d-dimer  [AH]    Clinical Course User Index [AH] Arloa Chroman, PA-C                                 Medical Decision Making Patient here for evaluation of chest pain and possible STI.  Notes single episode of brief syncope earlier today.  Had left-sided stabbing type chest pain shortly after.  Endorses increased stress recently.  No history of heart disease.  No shortness of breath  Patient well-appearing on my exam, typing on a laptop when I entered the room.  Vital signs reassuring.  Tachycardia tachypnea or hypoxia.  Differential would include but not limited to ACS, PE, musculoskeletal pain, anxiety reaction, also having dysuria symptoms and requesting evaluation for STI.  No abdominal pain on exam.  Amount and/or Complexity of Data Reviewed Labs: ordered.    Details: Labs interpreted by me, no evidence of leukocytosis, chemistries without significant derangement, urinalysis without evidence of infection.  Urine pregnancy test is negative.  Delta troponin reassuring.  D-dimer reassuring Radiology: ordered.    Details: Chest x-ray without acute finding ECG/medicine tests: ordered.    Details: EKG shows sinus rhythm with right atrial  enlargement Discussion of management or test interpretation with external provider(s): On recheck, patient resting comfortably.  Has been given Tylenol  here.  GC and chlamydia culture pending.  Will presumptively treat with Rocephin  here and prescription for doxycycline .  She has upcoming appointment with her OB/GYN.    Dimer reassuring.  Will also follow-up with PCP.     Risk OTC drugs. Prescription drug management.       Final Clinical Impression(s) / ED Diagnoses Final diagnoses:  Nonspecific chest pain  Possible exposure to STI    Rx / DC Orders ED Discharge Orders     None         Herlinda Milling, PA-C 07/12/23 2241    Herlinda Milling, PA-C 07/12/23 2243    Francesca Elsie CROME, MD 07/12/23 [phone]

## 2023-07-12 NOTE — Discharge Instructions (Signed)
 You have been treated here with antibiotics.  A prescription for another antibiotic has been sent to your pharmacy.  Please take as directed until finished.  Also follow-up with your OB/GYN and your primary care provider for recheck.  Return to the emergency department for any new or worsening symptoms.

## 2023-07-12 NOTE — ED Notes (Signed)
Pt ambulated to [facility]

## 2023-07-12 NOTE — ED Triage Notes (Addendum)
 Pt BIB [facility] for near syncope and CP, pt states left sided CP started today. Reported pt given 324 mg ASA, 4 of Zofran and NTG x 2 en route.

## 2023-07-14 LAB — GC/CHLAMYDIA PROBE AMP (~~LOC~~) NOT AT ARMC
Chlamydia: NEGATIVE
Comment: NEGATIVE
Comment: NORMAL
Neisseria Gonorrhea: NEGATIVE

## 2023-07-15 ENCOUNTER — Other Ambulatory Visit: Payer: Commercial Managed Care - HMO

## 2023-07-27 ENCOUNTER — Ambulatory Visit: Payer: Self-pay | Admitting: Family Medicine

## 2023-07-28 ENCOUNTER — Telehealth: Payer: Commercial Managed Care - HMO | Admitting: Physician Assistant

## 2023-07-28 DIAGNOSIS — G43009 Migraine without aura, not intractable, without status migrainosus: Secondary | ICD-10-CM | POA: Diagnosis not present

## 2023-07-28 DIAGNOSIS — R110 Nausea: Secondary | ICD-10-CM | POA: Diagnosis not present

## 2023-07-28 DIAGNOSIS — R519 Headache, unspecified: Secondary | ICD-10-CM

## 2023-07-28 MED ORDER — ONDANSETRON HCL 4 MG PO TABS
4.0000 mg | ORAL_TABLET | Freq: Three times a day (TID) | ORAL | 0 refills | Status: AC | PRN
Start: 2023-07-28 — End: ?

## 2023-07-28 MED ORDER — AMITRIPTYLINE HCL 75 MG PO TABS: 75.0000 mg | ORAL_TABLET | Freq: Every day | ORAL | 0 refills | Status: DC

## 2023-07-28 NOTE — Patient Instructions (Signed)
April Ayala, thank you for joining Gilberto Better, PA-C for today's virtual visit.  While this provider is not your primary care provider (PCP), if your PCP is located in our provider database this encounter information will be shared with them immediately following your visit.   A [facility] MyChart account gives you access to today's visit and all your visits, tests, and labs performed at [facility] " click here if you don't have a [facility] MyChart account or go to [email or web address]  Consent: (Patient) April Ayala provided verbal consent for this virtual visit at the beginning of the encounter.  Current Medications:  Current Outpatient Medications:    amitriptyline (ELAVIL) 75 MG tablet, Take 1 tablet (75 mg total) by mouth at bedtime., Disp: 30 tablet, Rfl: 0   ondansetron (ZOFRAN) 4 MG tablet, Take 1 tablet (4 mg total) by mouth every 8 (eight) hours as needed for nausea or vomiting., Disp: 20 tablet, Rfl: 0   acyclovir ointment (ZOVIRAX) 5 %, Apply three times daily to cold sore until resolution, Disp: 15 g, Rfl: 0   doxycycline (VIBRAMYCIN) 100 MG capsule, Take 1 capsule (100 mg total) by mouth 2 (two) times daily., Disp: 20 capsule, Rfl: 0   DULoxetine (CYMBALTA) 30 MG capsule, Take 1 capsule by mouth once daily, Disp: 90 capsule, Rfl: 0   Galcanezumab-gnlm (EMGALITY) 120 MG/ML SOAJ, INJECT 120 MG SUBCUTANEOUSLY EVERY 30 DAYS. MUST BE SEEN FOR FURTHER REFILLS. CALL [phone]., Disp: 1 mL, Rfl: 0   methocarbamol (ROBAXIN) 750 MG tablet, Take 750 mg by mouth in the morning and at bedtime., Disp: , Rfl:    omeprazole (PRILOSEC) 40 MG capsule, Take 1 capsule (40 mg total) by mouth daily., Disp: 90 capsule, Rfl: 3   oxyCODONE-acetaminophen (PERCOCET) 10-325 MG tablet, Take 1 tablet by mouth every 4 (four) hours as needed for pain., Disp: , Rfl:    potassium chloride SA (KLOR-CON M) 20 MEQ tablet, Take 1 tablet (20 mEq total) by mouth 2 (two) times  daily., Disp: 30 tablet, Rfl: 0   rosuvastatin (CRESTOR) 5 MG tablet, Take 1 tablet (5 mg total) by mouth daily., Disp: 90 tablet, Rfl: 3   Medications ordered in this encounter:  Meds ordered this encounter  Medications   amitriptyline (ELAVIL) 75 MG tablet    Sig: Take 1 tablet (75 mg total) by mouth at bedtime.    Dispense:  30 tablet    Refill:  0    Supervising Provider:   Merrilee Jansky [[id number]]   ondansetron (ZOFRAN) 4 MG tablet    Sig: Take 1 tablet (4 mg total) by mouth every 8 (eight) hours as needed for nausea or vomiting.    Dispense:  20 tablet    Refill:  0    Supervising Provider:   Merrilee Jansky [[id number]]     *If you need refills on other medications prior to your next appointment, please contact your pharmacy*  Follow-Up: Call back or seek an in-person evaluation if the symptoms worsen or if the condition fails to improve as anticipated.  [facility] [phone]  Other Instructions Start medicines as prescribed. Stay well hydrated. Follow up with PCP as scheduled or go to Urgent Care clinic if symptoms worsen. Pt verbalized understanding and in agreement.    If you have been instructed to have an in-person evaluation today at a local Urgent Care facility, please use the link below. It will take you to a list of all of our  available [facility], including address, phone number and hours of operation. Please do not delay care.  [facility]  If you or a family member do not have a primary care provider, use the link below to schedule a visit and establish care. When you choose a [facility] primary care physician or advanced practice provider, you gain a long-term partner in health. Find a Primary Care Provider  Learn more about [facility]'s in-office and virtual care options:  - Get Care Now

## 2023-07-28 NOTE — Progress Notes (Signed)
   Thank you for the details you included in the comment boxes. Those details are very helpful in determining the best course of treatment for you and help [place] to provide the best care. Because of having a bad headache, we recommend that you schedule a Virtual Urgent Care video visit in order for the provider to better assess what is going on.  The provider will be able to give you a more accurate diagnosis and treatment plan if we can more freely discuss your symptoms and with the addition of a virtual examination. If not already done, prior to your appointment it would be recommended to take an at home COvid 19 + Flu test from [facility].  If you change your visit to a video visit, we will bill your insurance (similar to an office visit) and you will not be charged for this e-Visit. You will be able to stay at home and speak with the first available [facility] advanced practice provider. The link to do a video visit is in the drop down Menu tab of your Welcome screen in MyChart.      I have spent 5 minutes in review of e-visit questionnaire, review and updating patient chart, medical decision making and response to patient.   Margaretann Loveless, PA-C
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
## 2023-08-06 ENCOUNTER — Ambulatory Visit: Payer: Commercial Managed Care - HMO | Admitting: Adult Health

## 2023-08-29 ENCOUNTER — Telehealth: Payer: Commercial Managed Care - HMO | Admitting: Physician Assistant

## 2023-08-29 DIAGNOSIS — B001 Herpesviral vesicular dermatitis: Secondary | ICD-10-CM

## 2023-08-29 MED ORDER — VALACYCLOVIR HCL 1 G PO TABS: 2000.0000 mg | ORAL_TABLET | Freq: Two times a day (BID) | ORAL | 0 refills | Status: AC

## 2023-08-29 NOTE — Progress Notes (Signed)
 I have spent 5 minutes in review of e-visit questionnaire, review and updating patient chart, medical decision making and response to patient.   Laure Kidney, PA-C

## 2023-08-29 NOTE — Progress Notes (Signed)

## 2023-09-10 ENCOUNTER — Telehealth: Payer: Self-pay | Admitting: Pharmacy Technician

## 2023-09-10 ENCOUNTER — Ambulatory Visit (INDEPENDENT_AMBULATORY_CARE_PROVIDER_SITE_OTHER): Payer: Self-pay | Admitting: Family Medicine

## 2023-09-10 ENCOUNTER — Other Ambulatory Visit (HOSPITAL_COMMUNITY): Payer: Self-pay

## 2023-09-10 ENCOUNTER — Encounter: Payer: Self-pay | Admitting: Family Medicine

## 2023-09-10 VITALS — BP 126/84 | HR 110 | Temp 98.2°F | Ht 60.0 in | Wt 192.4 lb

## 2023-09-10 DIAGNOSIS — R7301 Impaired fasting glucose: Secondary | ICD-10-CM | POA: Diagnosis not present

## 2023-09-10 DIAGNOSIS — B349 Viral infection, unspecified: Secondary | ICD-10-CM

## 2023-09-10 DIAGNOSIS — E7849 Other hyperlipidemia: Secondary | ICD-10-CM

## 2023-09-10 DIAGNOSIS — Z114 Encounter for screening for human immunodeficiency virus [HIV]: Secondary | ICD-10-CM

## 2023-09-10 DIAGNOSIS — G43009 Migraine without aura, not intractable, without status migrainosus: Secondary | ICD-10-CM

## 2023-09-10 DIAGNOSIS — E038 Other specified hypothyroidism: Secondary | ICD-10-CM

## 2023-09-10 DIAGNOSIS — E559 Vitamin D deficiency, unspecified: Secondary | ICD-10-CM | POA: Diagnosis not present

## 2023-09-10 DIAGNOSIS — Z1159 Encounter for screening for other viral diseases: Secondary | ICD-10-CM

## 2023-09-10 DIAGNOSIS — Z1211 Encounter for screening for malignant neoplasm of colon: Secondary | ICD-10-CM

## 2023-09-10 MED ORDER — PROMETHAZINE-DM 6.25-15 MG/5ML PO SYRP: 5.0000 mL | ORAL_SOLUTION | Freq: Four times a day (QID) | ORAL | 0 refills | Status: DC | PRN

## 2023-09-10 MED ORDER — NURTEC 75 MG PO TBDP: 75.0000 mg | ORAL_TABLET | ORAL | Status: AC

## 2023-09-10 MED ORDER — NURTEC 75 MG PO TBDP: ORAL_TABLET | ORAL | 0 refills | Status: DC

## 2023-09-10 NOTE — Progress Notes (Signed)
 New Patient Office Visit  Subjective:  Patient ID: [name], female    DOB: [birth year]  Age: 48 y.o. MRN: [id number]  CC:  Chief Complaint  Patient presents with   New Patient (Initial Visit)    HPI April Ayala is a 48 y.o. female  presents for establishing care. For the details of today's visit, please refer to the assessment and plan.      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   Cervical cancer (HCC) 04/2011   Stage 1B squamous cell   Chronic left shoulder pain 10/07/2017   Chronic tension-type headache, not intractable 10/07/2017   Depression    High cholesterol    History of kidney stones    passed   Morbid obesity with BMI of 40.0-44.9, adult (HCC) 09/29/2019   Prehypertension 06/26/2020   S/P lumbar fusion 04/20/2018    Past Surgical History:  Procedure Laterality Date   ABDOMINAL HYSTERECTOMY     BREAST BIOPSY Right    CERVICAL CONIZATION W/BX  03/24/2012   Procedure: CONIZATION CERVIX WITH BIOPSY;  Surgeon: Adam Phenix, MD;  Location: [facility];  Service: Gynecology;  Laterality: N/A;   DIAGNOSTIC LAPAROSCOPY  2007   ectopic   ECTOPIC PREGNANCY SURGERY     ESSURE TUBAL LIGATION  2010   INSERTION OF SUPRAPUBIC CATHETER  05/03/2012   Procedure: INSERTION OF SUPRAPUBIC CATHETER;  Surgeon: Jeannette Corpus, MD;  Location: [facility];  Service: Gynecology;;   LEEP     LUMBAR LAMINECTOMY/DECOMPRESSION MICRODISCECTOMY N/A 04/20/2018   Procedure: LUMBAR FIVE TO SACRAL ONE DECOMPRESSION AND FUSION, POSSIBLE LUMBAR FOUR TO SACRAL ONE; SCREW AND CAGE INSTRUMENTATION;  Surgeon: Venita Lick, MD;  Location: [facility];  Service: Orthopedics;  Laterality: N/A;  5 hrs   LYMPHADENECTOMY  05/03/2012   Procedure: LYMPHADENECTOMY;  Surgeon: Jeannette Corpus, MD;  Location: [facility];  Service: Gynecology;  Laterality: Bilateral;  pelvic    RADICAL HYSTERECTOMY  05/03/2012   Procedure: RADICAL HYSTERECTOMY;  Surgeon: Jeannette Corpus, MD;  Location: [facility];  Service: Gynecology;;  transposition of the ovaries   SPINE SURGERY     TUBAL LIGATION      Family History  Problem Relation Age of Onset   Diabetes Mother    Hypertension Father    Diabetes Sister    Migraines Sister    Hypothyroidism Sister    Migraines Sister    Migraines Sister     Social History   Socioeconomic History   Marital status: Significant Other    Spouse name: Not on file   Number of children: 4   Years of education: Not on file   Highest education level: GED or equivalent  Occupational History   Occupation: Statistician  Tobacco Use   Smoking status: Every Day    Current packs/day: 0.50    Average packs/day: 0.5 packs/day for 29.9 years (14.9 ttl pk-yrs)    Types: Cigarettes    Start date: 11/02/1993   Smokeless tobacco: Never   Tobacco comments:    1 pack/3 days  Vaping Use   Vaping status: Never Used  Substance and Sexual Activity   Alcohol use: Never   Drug use: Never   Sexual activity: Yes    Birth control/protection: Surgical    Comment: hyst  Other Topics Concern   Not on file  Social History Narrative   Lives with fiance  and 1 daughter      1 son is with grandmother    Oldest daughter is 74 lives with grandfather      Works as a Merchandiser, retail at [employer].     Lives in [place], [place].      6 dogs, a  beard dragon, a snake, and a hamster and a cat      Enjoys: taking care of her pets      Diet: avoids lunch due to work, eats all food groups   Caffeine: monsters in the [employer]: limited      Wears seat belt    Does not use phone while driving    Smoke detectors at home       Right handed   Social Drivers of Health   Financial Resource Strain: Low Risk  (02/17/2023)   Overall Financial Resource Strain (CARDIA)    Difficulty of Paying Living Expenses: Not hard at all  Food Insecurity: No  Food Insecurity (02/17/2023)   Hunger Vital Sign    Worried About Running Out of Food in the Last Year: Never true    Ran Out of Food in the Last Year: Never true  Transportation Needs: No Transportation Needs (02/17/2023)   PRAPARE - Administrator, Civil Service (Medical): No    Lack of Transportation (Non-Medical): No  Physical Activity: Inactive (03/05/2021)   Exercise Vital Sign    Days of Exercise per Week: 0 days    Minutes of Exercise per Session: 0 min  Stress: No Stress Concern Present (03/05/2021)   [employer] of Occupational Health - Occupational Stress Questionnaire    Feeling of Stress : Only a little  Social Connections: Socially Isolated (02/17/2023)   Social Connection and Isolation Panel [NHANES]    Frequency of Communication with Friends and Family: Once a week    Frequency of Social Gatherings with Friends and Family: Once a week    Attends Religious Services: Never    Database administrator or Organizations: No    Attends Engineer, structural: Not on file    Marital Status: Married  Intimate Partner Violence: Unknown (10/08/2021)   Received from [employer], [facility]   HITS    Physically Hurt: Not on file    Insult or Talk Down To: Not on file    Threaten Physical Harm: Not on file    Scream or Curse: Not on file    ROS Review of Systems  Objective:   Today's Vitals: BP 126/84   Pulse (!) 110   Temp 98.2 F (36.8 C)   Ht 5' (1.524 m)   Wt 192 lb 6.4 oz (87.3 kg)   LMP 04/11/2012   SpO2 94%   BMI 37.58 kg/m   Physical Exam   Assessment & Plan:   Migraine without aura and without status migrainosus, not intractable Assessment & Plan: The patient reports having two migraine episodes per week. She takes amitriptyline 75 mg with some relief. Will initiate therapy with Nurtec 75 mg for acute episodes and encourage her to continue taking amitriptyline. Preventive measures provided include: Decreasing or avoiding  caffeine and alcohol: Reducing intake can help minimize migraine triggers. Eating and sleeping on a regular schedule: Consistency in meal and sleep times can reduce the frequency of migraines. Exercising several times per week: Regular physical activity can help reduce migraine frequency and severity.   Orders: -     Nurtec; Take 1 tablet  PO  for acute migraines attacks. Max: 75mg /24h  Dispense: 16 tablet; Refill: 0 -     Nurtec; Take 1 tablet (75 mg total) by mouth 1 day or 1 dose for 1 dose.  Viral illness Assessment & Plan: The patient complains of a cough and sore throat for one day with no other complaints. Will treat symptomatically with Promethazine DM 5 mL by mouth every 4 hours as needed for cough and cold symptoms. Supportive Care Recommendations: Increase fluid intake and allow for plenty of rest. Take Tylenol as needed for pain, fever, or general discomfort. Perform warm saltwater gargles 3-4 times daily to help with throat pain or discomfort. (Mix  teaspoon of salt in a glass of warm water and gargle several times daily to reduce throat inflammation and soothe irritation.) Drink ginger tea to reduce throat irritation and help with nausea. Use sugar-free or honey-based throat lozenges to help with a sore throat. Use a humidifier at bedtime to help with cough and nasal congestion. For nasal congestion: Heated, humidified air is a safe and effective therapy. Saline nasal sprays may also help alleviate nasal symptoms of the common cold. For cough: Treatment with honey may reduce cough frequency and severity. Follow up if symptoms do not improve within 7 to 10 days.   Orders: -     Promethazine-DM; Take 5 mLs by mouth 4 (four) times daily as needed.  Dispense: 118 mL; Refill: 0  Colon cancer screening -     Ambulatory referral to Gastroenterology  IFG (impaired fasting glucose) -     Hemoglobin A1c  Vitamin D deficiency -     VITAMIN D 25 Hydroxy (Vit-D Deficiency,  Fractures)  Need for hepatitis C screening test -     Hepatitis C antibody  Encounter for screening for HIV -     HIV Antibody (routine testing w rflx)  TSH (thyroid-stimulating hormone deficiency) -     TSH + free T4  Other hyperlipidemia -     Lipid panel -     CMP14+EGFR -     CBC with Differential/Platelet  Note: This chart has been completed using Engineer, civil (consulting) software, and while attempts have been made to ensure accuracy, certain words and phrases may not be transcribed as intended.     Follow-up: Return in about 4 months (around 01/10/2024).   Gilmore Laroche, FNP

## 2023-09-10 NOTE — Telephone Encounter (Signed)
 Pharmacy Patient Advocate Encounter   Received notification from Patient Pharmacy that prior authorization for NURTEC ODT 75MG  TABLETS is required/requested.   Insurance verification completed.   The patient is insured through [facility] .   Per test claim: PA required; PA submitted to above mentioned insurance via CoverMyMeds Key/confirmation #/[facility] Status is pending

## 2023-09-10 NOTE — Patient Instructions (Addendum)
 I appreciate the opportunity to provide care to you today!    Follow up:  4 months  Fasting Labs: please stop by the lab today/ during the week to get your blood drawn (CBC, CMP, TSH, Lipid profile, HgA1c, Vit D)  Screening: HIV and Hep C    Migraine Prevention and Management  Lifestyle Changes: -Decrease or Avoid Caffeine and Alcohol: Reducing intake can help minimize migraine triggers. Eat and Sleep on a Regular Schedule: Consistency in meal and sleep times can reduce the frequency of migraines. Exercise Several Times per Week: Regular physical activity can help reduce migraine frequency and severity. Migraine Rescue: Nurtec: Take 75 mg at the onset of a migraine with a glass of liquid. The maximum dose is 75mg  per 24-hour period.   Viral Illness:  I recommend symptomatic treatments with the following: Start taking Promethazine DM 5 mL by mouth every 4 hours as needed for cough and cold symptoms. Increase fluid intake and allow for plenty of rest. Take Tylenol as needed for pain, fever, or general discomfort. Perform warm saltwater gargles 3-4 times daily to help with throat pain or discomfort. (Mix 1/2 teaspoon of salt in a glass of warm water and gargle several times daily to reduce throat inflammation and soothe irritation.) Ginger tea: Reduces throat irritation and can help with nausea. Look for sugar-free or honey-based throat lozenges to help with a sore throat. Use a humidifier at bedtime to help with cough and nasal congestion. For nasal congestion: The use of heated humidified air is a safe and effective therapy. Saline nasal sprays may also help alleviate nasal symptoms of the common cold. For cough: Treatment with honey may reduce cough frequency and severity. Follow up if your symptoms do not improve after 7 to 10 days of symptom onset.    Referral: GI for colon cancer screening   Please continue to a heart-healthy diet and increase your physical activities. Try to  exercise for at least five days a week.    It was a pleasure to see you and I look forward to continuing to work together on your health and well-being. Please do not hesitate to call the office if you need care or have questions about your care.  In case of emergency, please visit the Emergency Department for urgent care, or contact our clinic at [phone] to schedule an appointment. We're here to help you!   Have a wonderful day and week. With Gratitude, Gilmore Laroche MSN, FNP-BC

## 2023-09-13 ENCOUNTER — Other Ambulatory Visit (HOSPITAL_COMMUNITY): Payer: Self-pay

## 2023-09-13 DIAGNOSIS — B349 Viral infection, unspecified: Secondary | ICD-10-CM | POA: Insufficient documentation

## 2023-09-13 NOTE — Telephone Encounter (Signed)
 Pharmacy Patient Advocate Encounter  Received notification from [facility] that Prior Authorization for NURTEC ODT 75MG  TABLETS has been APPROVED from 09/13/2023 to 09/12/2024. Ran test claim, Copay is $4.00. This test claim was processed through [facility]- copay amounts may vary at other pharmacies due to pharmacy/plan contracts, or as the patient moves through the different stages of their insurance plan.   PA #/Case ID/Reference #: [id number]

## 2023-09-13 NOTE — Assessment & Plan Note (Signed)
 The patient reports having two migraine episodes per week. She takes amitriptyline 75 mg with some relief. Will initiate therapy with Nurtec 75 mg for acute episodes and encourage her to continue taking amitriptyline. Preventive measures provided include: Decreasing or avoiding caffeine and alcohol: Reducing intake can help minimize migraine triggers. Eating and sleeping on a regular schedule: Consistency in meal and sleep times can reduce the frequency of migraines. Exercising several times per week: Regular physical activity can help reduce migraine frequency and severity.

## 2023-09-13 NOTE — Assessment & Plan Note (Signed)
 The patient complains of a cough and sore throat for one day with no other complaints. Will treat symptomatically with Promethazine DM 5 mL by mouth every 4 hours as needed for cough and cold symptoms. Supportive Care Recommendations: Increase fluid intake and allow for plenty of rest. Take Tylenol as needed for pain, fever, or general discomfort. Perform warm saltwater gargles 3-4 times daily to help with throat pain or discomfort. (Mix  teaspoon of salt in a glass of warm water and gargle several times daily to reduce throat inflammation and soothe irritation.) Drink ginger tea to reduce throat irritation and help with nausea. Use sugar-free or honey-based throat lozenges to help with a sore throat. Use a humidifier at bedtime to help with cough and nasal congestion. For nasal congestion: Heated, humidified air is a safe and effective therapy. Saline nasal sprays may also help alleviate nasal symptoms of the common cold. For cough: Treatment with honey may reduce cough frequency and severity. Follow up if symptoms do not improve within 7 to 10 days.

## 2023-09-15 ENCOUNTER — Encounter: Payer: Self-pay | Admitting: *Deleted

## 2023-09-23 ENCOUNTER — Telehealth: Admitting: Physician Assistant

## 2023-09-23 DIAGNOSIS — R3989 Other symptoms and signs involving the genitourinary system: Secondary | ICD-10-CM | POA: Diagnosis not present

## 2023-09-23 MED ORDER — CEPHALEXIN 500 MG PO CAPS: 500.0000 mg | ORAL_CAPSULE | Freq: Two times a day (BID) | ORAL | 0 refills | Status: AC

## 2023-09-23 NOTE — Progress Notes (Signed)

## 2023-09-23 NOTE — Progress Notes (Signed)
 I have spent 5 minutes in review of e-visit questionnaire, review and updating patient chart, medical decision making and response to patient.   Piedad Climes, PA-C

## 2023-09-23 NOTE — Progress Notes (Signed)
 Message sent to patient requesting further input regarding current symptoms. Awaiting patient response.

## 2023-09-29 ENCOUNTER — Telehealth: Admitting: Physician Assistant

## 2023-09-29 DIAGNOSIS — H6991 Unspecified Eustachian tube disorder, right ear: Secondary | ICD-10-CM

## 2023-09-29 MED ORDER — FLUTICASONE PROPIONATE 50 MCG/ACT NA SUSP: 2.0000 | Freq: Every day | NASAL | 0 refills | Status: AC

## 2023-09-29 NOTE — Progress Notes (Signed)
E-Visit for Ear Pain - Eustachian Tube Dysfunction   We are sorry that you are not feeling well. Here is how we plan to help!  Based on what you have shared with me it looks like you have Eustachian Tube Dysfunction.  Eustachian Tube Dysfunction is a condition where the tubes that connect your middle ears to your upper throat become blocked. This can lead to discomfort, hearing difficulties and a feeling of fullness in your ear. Eustachian tube dysfunction usually resolves itself in a few days. The usual symptoms include: Hearing problems Tinnitus, or ringing in your ears Clicking or popping sounds A feeling of fullness in your ears Pain that mimics an ear infection Dizziness, vertigo or balance problems A "tickling" sensation in your ears  ?Eustachian tube dysfunction symptoms may get worse in higher altitudes. This is called barotrauma, and it can happen while scuba diving, flying in an airplane or driving in the mountains.   What causes eustachian tube dysfunction? Allergies and infections (like the common cold and the flu) are the most common causes of eustachian tube dysfunction. These conditions can cause inflammation and mucus buildup, leading to blockage. GERD, or chronic acid reflux, can also cause ETD. This is because stomach acid can back up into your throat and result in inflammation. As mentioned above, altitude changes can also cause ETD.   What are some common eustachian tube dysfunction treatments? In most cases, treatment isn't necessary because ETD often resolves on its own. However, you might need treatment if your symptoms linger for more than two weeks.    Eustachian tube dysfunction treatment depends on the cause and the severity of your condition. Treatments may include home remedies, medications or, in severe cases, surgery.     HOME CARE: Sometimes simple home remedies can help with mild cases of eustachian tube dysfunction. To try and clear the blockage, you  can: Chew gum. Yawn. Swallow. Try the Valsalva maneuver (breathing out forcefully while closing your mouth and pinching your nostrils). Use a saline spray to clear out nasal passages.  MEDICATIONS: Over-the-counter medications can help if allergies are causing eustachian tube dysfunction. Try antihistamines (like cetirizine or diphenhydramine) to ease your symptoms. If you have discomfort, pain relievers -- such as acetaminophen or ibuprofen -- can help.  Sometimes intranasal glucocorticosteroids (like Flonase or Nasacort) help.  I have prescribed Fluticasone 50 mcg/spray 2 sprays in each nostril daily for 10-14 days    GET HELP RIGHT AWAY IF: Fever is over 102.2 degrees. You develop progressive ear pain or hearing loss. Ear symptoms persist longer than 3 days after treatment.  MAKE SURE YOU: Understand these instructions. Will watch your condition. Will get help right away if you are not doing well or get worse.  Thank you for choosing an e-visit.  Your e-visit answers were reviewed by a board certified advanced clinical practitioner to complete your personal care plan. Depending upon the condition, your plan could have included both over the counter or prescription medications.  Please review your pharmacy choice. Make sure the pharmacy is open so you can pick up the prescription now. If there is a problem, you may contact your provider through MyChart messaging and have the prescription routed to another pharmacy.  Your safety is important to us. If you have drug allergies check your prescription carefully.   For the next 24 hours you can use MyChart to ask questions about today's visit, request a non-urgent call back, or ask for a work or school excuse. You will   get an email with a survey after your eVisit asking about your experience. We would appreciate your feedback. I hope that your e-visit has been valuable and will aid in your recovery.  I have spent 5 minutes in review of  e-visit questionnaire, review and updating patient chart, medical decision making and response to patient.   Robbye Dede M Jeramia Saleeby, PA-C  

## 2023-10-04 ENCOUNTER — Telehealth: Admitting: Physician Assistant

## 2023-10-04 DIAGNOSIS — H6691 Otitis media, unspecified, right ear: Secondary | ICD-10-CM

## 2023-10-05 MED ORDER — AMOXICILLIN 875 MG PO TABS: 875.0000 mg | ORAL_TABLET | Freq: Two times a day (BID) | ORAL | 0 refills | Status: AC

## 2023-10-05 NOTE — Progress Notes (Signed)
 E-Visit for Ear Pain - Acute Otitis Media   We are sorry that you are not feeling well. Here is how we plan to help!  Based on what you have shared with me it looks like you have Acute Otitis Media.  Acute Otitis Media is an infection of the middle or "inner" ear. This type of infection can cause redness, inflammation, and fluid buildup behind the tympanic membrane (ear drum).  The usual symptoms include: Earache/Pain Fever Upper respiratory symptoms Lack of energy/Fatigue/Malaise Slight hearing loss gradually worsening- if the inner ear fills with fluid What causes middle ear infections? Most middle ear infections occur when an infection such as a cold, leads to a build-up of mucus in the middle ear and causes the Eustachian tube (a thin tube that runs from the middle ear to the back of the nose) to become swollen or blocked.   This means mucus can't drain away properly, making it easier for an infection to spread into the middle ear.  How middle ear infections are treated: Most ear infections clear up within three to five days and don't need any specific treatment. If necessary, tylenol or ibuprofen should be used to relieve pain and a high temperature.  If you develop a fever higher than 102, or any significantly worsening symptoms, this could indicate a more serious infection moving to the middle/inner and needs face to face evaluation in an office by a provider.   Antibiotics aren't routinely used to treat middle ear infections, although they may occasionally be prescribed if symptoms persist or are particularly severe. Given your presentation,   I have prescribed Amoxicillin 875 mg one tablet twice daily for 10 days   Your symptoms should improve over the next 3 days and should resolve in about 7 days. Be sure to complete ALL of the prescription(s) given.  HOME CARE: Wash your hands frequently. If you are prescribed an ear drop, do not place the tip of the bottle on your ear or  touch it with your fingers. You can take Acetaminophen 650 mg every 4-6 hours as needed for pain.  If pain is severe or moderate, you can apply a heating pad (set on low) or hot water bottle (wrapped in a towel) to outer ear for 20 minutes.  This will also increase drainage.  GET HELP RIGHT AWAY IF: Fever is over 102.2 degrees. You develop progressive ear pain or hearing loss. Ear symptoms persist longer than 3 days after treatment.  MAKE SURE YOU: Understand these instructions. Will watch your condition. Will get help right away if you are not doing well or get worse.  Thank you for choosing an e-visit.  Your e-visit answers were reviewed by a board certified advanced clinical practitioner to complete your personal care plan. Depending upon the condition, your plan could have included both over the counter or prescription medications.  Please review your pharmacy choice. Make sure the pharmacy is open so you can pick up the prescription now. If there is a problem, you may contact your provider through [employer] and have the prescription routed to another pharmacy.  Your safety is important to [place]. If you have drug allergies check your prescription carefully.   For the next 24 hours you can use MyChart to ask questions about today's visit, request a non-urgent call back, or ask for a work or school excuse. You will get an email with a survey after your eVisit asking about your experience. We would appreciate your feedback. I hope  that your e-visit has been valuable and will aid in your recovery.

## 2023-10-05 NOTE — Progress Notes (Signed)
 I have spent 5 minutes in review of e-visit questionnaire, review and updating patient chart, medical decision making and response to patient.   Piedad Climes, PA-C

## 2023-11-02 DIAGNOSIS — G894 Chronic pain syndrome: Secondary | ICD-10-CM | POA: Diagnosis not present

## 2023-11-02 DIAGNOSIS — M5416 Radiculopathy, lumbar region: Secondary | ICD-10-CM | POA: Diagnosis not present

## 2023-11-02 DIAGNOSIS — R52 Pain, unspecified: Secondary | ICD-10-CM | POA: Diagnosis not present

## 2023-11-14 ENCOUNTER — Other Ambulatory Visit: Payer: Self-pay | Admitting: Family Medicine

## 2023-11-14 DIAGNOSIS — G43009 Migraine without aura, not intractable, without status migrainosus: Secondary | ICD-10-CM

## 2023-11-15 MED ORDER — NURTEC 75 MG PO TBDP
ORAL_TABLET | ORAL | 0 refills | Status: DC
Start: 2023-11-15 — End: 2024-01-17

## 2023-12-06 ENCOUNTER — Other Ambulatory Visit (HOSPITAL_COMMUNITY): Payer: Self-pay | Admitting: Family Medicine

## 2023-12-06 DIAGNOSIS — Z1231 Encounter for screening mammogram for malignant neoplasm of breast: Secondary | ICD-10-CM

## 2023-12-13 ENCOUNTER — Inpatient Hospital Stay (HOSPITAL_COMMUNITY): Admission: RE | Admit: 2023-12-13 | Source: Ambulatory Visit

## 2023-12-13 DIAGNOSIS — Z1231 Encounter for screening mammogram for malignant neoplasm of breast: Secondary | ICD-10-CM

## 2023-12-17 ENCOUNTER — Encounter: Payer: Self-pay | Admitting: Family Medicine

## 2023-12-23 ENCOUNTER — Other Ambulatory Visit: Payer: Self-pay | Admitting: Family Medicine

## 2023-12-23 DIAGNOSIS — G43009 Migraine without aura, not intractable, without status migrainosus: Secondary | ICD-10-CM

## 2023-12-23 DIAGNOSIS — J84116 Cryptogenic organizing pneumonia: Secondary | ICD-10-CM | POA: Diagnosis not present

## 2023-12-23 DIAGNOSIS — J9691 Respiratory failure, unspecified with hypoxia: Secondary | ICD-10-CM | POA: Diagnosis not present

## 2023-12-23 MED ORDER — AMITRIPTYLINE HCL 75 MG PO TABS: 75.0000 mg | ORAL_TABLET | Freq: Every day | ORAL | 0 refills | Status: DC

## 2023-12-31 DIAGNOSIS — M25512 Pain in left shoulder: Secondary | ICD-10-CM | POA: Diagnosis not present

## 2024-01-10 ENCOUNTER — Ambulatory Visit: Admitting: Family Medicine

## 2024-01-14 ENCOUNTER — Encounter: Payer: Self-pay | Admitting: Family Medicine

## 2024-01-17 ENCOUNTER — Other Ambulatory Visit: Payer: Self-pay

## 2024-01-17 DIAGNOSIS — G43009 Migraine without aura, not intractable, without status migrainosus: Secondary | ICD-10-CM

## 2024-01-17 MED ORDER — NURTEC 75 MG PO TBDP: ORAL_TABLET | ORAL | 0 refills | Status: DC

## 2024-01-17 MED ORDER — AMITRIPTYLINE HCL 75 MG PO TABS: 75.0000 mg | ORAL_TABLET | Freq: Every day | ORAL | 0 refills | Status: DC

## 2024-01-19 ENCOUNTER — Encounter: Payer: Self-pay | Admitting: Family Medicine

## 2024-01-22 DIAGNOSIS — J84116 Cryptogenic organizing pneumonia: Secondary | ICD-10-CM | POA: Diagnosis not present

## 2024-01-22 DIAGNOSIS — J9691 Respiratory failure, unspecified with hypoxia: Secondary | ICD-10-CM | POA: Diagnosis not present

## 2024-01-25 ENCOUNTER — Other Ambulatory Visit (HOSPITAL_COMMUNITY): Payer: Self-pay | Admitting: Family Medicine

## 2024-01-25 DIAGNOSIS — Z1231 Encounter for screening mammogram for malignant neoplasm of breast: Secondary | ICD-10-CM

## 2024-01-31 ENCOUNTER — Encounter (HOSPITAL_COMMUNITY)

## 2024-01-31 DIAGNOSIS — Z1231 Encounter for screening mammogram for malignant neoplasm of breast: Secondary | ICD-10-CM

## 2024-02-06 ENCOUNTER — Other Ambulatory Visit: Payer: Self-pay | Admitting: Medical Genetics

## 2024-02-09 ENCOUNTER — Encounter: Payer: Self-pay | Admitting: Emergency Medicine

## 2024-02-09 ENCOUNTER — Ambulatory Visit
Admission: EM | Admit: 2024-02-09 | Discharge: 2024-02-09 | Disposition: A | Discharge status: Home / Self Care | Attending: Nurse Practitioner | Admitting: Nurse Practitioner

## 2024-02-09 ENCOUNTER — Other Ambulatory Visit: Payer: Self-pay

## 2024-02-09 DIAGNOSIS — L03115 Cellulitis of right lower limb: Secondary | ICD-10-CM | POA: Diagnosis not present

## 2024-02-09 MED ORDER — CEFTRIAXONE SODIUM 1 G IJ SOLR
1.0000 g | Freq: Once | INTRAMUSCULAR | Status: AC
  Administered 2024-02-09: 1 g via INTRAMUSCULAR

## 2024-02-09 MED ORDER — AMOXICILLIN-POT CLAVULANATE 875-125 MG PO TABS: 1.0000 | ORAL_TABLET | Freq: Two times a day (BID) | ORAL | 0 refills | Status: DC

## 2024-02-09 NOTE — Discharge Instructions (Signed)
 You were given an injection of Rocephin  1 g today.  Start Augmentin  on 02/10/2024. You may take over-the-counter Tylenol  or ibuprofen  as needed for pain, fever, or general discomfort. Apply ice to the right foot to help with pain or swelling.  Apply for 20 minutes, remove for 1 hour, repeat as needed. Recommend warm Epsom salt soaks to help with pain, swelling, or discomfort. Seek care if you have increased redness, swelling, or if you develop fever, chills, or other concerns. Follow-up as needed.
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
## 2024-02-09 NOTE — ED Triage Notes (Addendum)
 Pt reports right foot swelling/redness/pain to third, fourth,fifth digit since yesterday. Denies any known injury, trauma, or bite. Pt reports applied powder to site.   Pt able to bear weight but with mild limp. Moderate swelling noted to toes on right foot and mild swelling noted to top of right foot.

## 2024-02-20 ENCOUNTER — Encounter: Payer: Self-pay | Admitting: Family Medicine

## 2024-02-21 ENCOUNTER — Other Ambulatory Visit: Payer: Self-pay

## 2024-02-21 DIAGNOSIS — G43009 Migraine without aura, not intractable, without status migrainosus: Secondary | ICD-10-CM

## 2024-02-21 MED ORDER — AMITRIPTYLINE HCL 75 MG PO TABS: 75.0000 mg | ORAL_TABLET | Freq: Every day | ORAL | 0 refills | Status: DC

## 2024-02-22 ENCOUNTER — Other Ambulatory Visit (HOSPITAL_COMMUNITY)

## 2024-02-22 DIAGNOSIS — J84116 Cryptogenic organizing pneumonia: Secondary | ICD-10-CM | POA: Diagnosis not present

## 2024-02-22 DIAGNOSIS — J9691 Respiratory failure, unspecified with hypoxia: Secondary | ICD-10-CM | POA: Diagnosis not present

## 2024-02-24 ENCOUNTER — Other Ambulatory Visit: Payer: Self-pay | Admitting: Family Medicine

## 2024-02-25 ENCOUNTER — Encounter: Payer: Self-pay | Admitting: Radiology

## 2024-03-02 DIAGNOSIS — Z79899 Other long term (current) drug therapy: Secondary | ICD-10-CM | POA: Diagnosis not present

## 2024-03-02 DIAGNOSIS — G894 Chronic pain syndrome: Secondary | ICD-10-CM | POA: Diagnosis not present

## 2024-03-02 DIAGNOSIS — M48062 Spinal stenosis, lumbar region with neurogenic claudication: Secondary | ICD-10-CM | POA: Diagnosis not present

## 2024-03-02 DIAGNOSIS — Z5181 Encounter for therapeutic drug level monitoring: Secondary | ICD-10-CM | POA: Diagnosis not present

## 2024-03-08 ENCOUNTER — Encounter: Payer: Self-pay | Admitting: Family Medicine

## 2024-03-09 NOTE — Telephone Encounter (Signed)
 noted

## 2024-03-17 ENCOUNTER — Encounter (INDEPENDENT_AMBULATORY_CARE_PROVIDER_SITE_OTHER): Payer: Self-pay | Admitting: *Deleted

## 2024-03-30 ENCOUNTER — Encounter: Admitting: Nurse Practitioner

## 2024-03-30 ENCOUNTER — Telehealth: Admitting: Physician Assistant

## 2024-03-30 DIAGNOSIS — H60391 Other infective otitis externa, right ear: Secondary | ICD-10-CM | POA: Diagnosis not present

## 2024-03-30 DIAGNOSIS — H9201 Otalgia, right ear: Secondary | ICD-10-CM

## 2024-03-30 MED ORDER — CIPROFLOXACIN-DEXAMETHASONE 0.3-0.1 % OT SUSP: 4.0000 [drp] | Freq: Two times a day (BID) | OTIC | 0 refills | Status: AC

## 2024-03-30 NOTE — Progress Notes (Signed)
 E Visit for Ear Pain   We are sorry that you are not feeling well. Here is how we plan to help!  Based on what you have shared with me it looks like you have Otitis Externa.  Otitis Externa is a redness or swelling, irritation, or infection of your outer ear canal. These symptoms usually occur within a few days of swimming, or getting water  in the ear. Your ear canal is a tube that goes from the opening of the ear to the eardrum.  When water  stays in your ear canal, germs can grow.  This is a painful condition that often happens to children and swimmers of all ages.  It is not contagious and oral antibiotics are not required to treat uncomplicated otitis externa.  The usual symptoms include:    Itchiness inside the ear  Redness or a sense of swelling in the ear  Pain when the ear is tugged on when pressure is placed on the ear  Pus draining from the infected ear   I have prescribed: Ciprofloxacin  0.3% and dexamethasone  0.1% otic suspension four drops in affected ears two times a day for 7 days  In certain cases, otitis externa may progress to a more serious bacterial infection of the middle or inner ear.  If you have a fever 102 and up and significantly worsening symptoms, this could indicate a more serious infection moving to the middle/inner and needs face to face evaluation in an office by a provider.  Your symptoms should improve over the next 3 days and should resolve in about 7 days.  Be sure to complete ALL of your prescription.  HOME CARE: Wash your hands frequently. If you are prescribed an ear drop, do not place the tip of the bottle on your ear or touch it with your fingers. You can take Acetaminophen  650 mg every 4-6 hours as needed for pain.  If pain is severe or moderate, you can apply a heating pad (set on low) or hot water  bottle (wrapped in a towel) to outer ear for 20 minutes.  This will also increase drainage. Avoid ear plugs Do not go swimming until the symptoms are  gone Do not use Q-tips After showers, help the water  run out by tilting your head to one side.   GET HELP RIGHT AWAY IF: Fever is over 102.2 degrees. You develop progressive ear pain or hearing loss. Ear symptoms persist longer than 3 days after treatment.  MAKE SURE YOU: Understand these instructions. Will watch your condition. Will get help right away if you are not doing well or get worse.  TO PREVENT SWIMMER'S EAR: Use a bathing cap or custom fitted swim molds to keep your ears dry. Towel off after swimming to dry your ears. Tilt your head or pull your earlobes to allow the water  to escape your ear canal. If there is still water  in your ears, consider using a hairdryer on the lowest setting.  Thank you for choosing an e-visit.  Your e-visit answers were reviewed by a board certified advanced clinical practitioner to complete your personal care plan. Depending upon the condition, your plan could have included both over the counter or prescription medications.  Please review your pharmacy choice. Make sure the pharmacy is open so you can pick up the prescription now. If there is a problem, you may contact your provider through [employer] and have the prescription routed to another pharmacy.  Your safety is important to us . If you have drug allergies  check your prescription carefully.   For the next 24 hours you can use MyChart to ask questions about today's visit, request a non-urgent call back, or ask for a work or school excuse. You will get an email with a survey after your eVisit asking about your experience. We would appreciate your feedback. I hope that your e-visit has been valuable and will aid in your recovery.     I have spent 5 minutes in review of e-visit questionnaire, review and updating patient chart, medical decision making and response to patient.   Delon CHRISTELLA Dickinson, PA-C

## 2024-04-06 ENCOUNTER — Encounter: Admitting: Family Medicine

## 2024-04-06 ENCOUNTER — Other Ambulatory Visit: Payer: Self-pay | Admitting: Family Medicine

## 2024-04-06 DIAGNOSIS — G43009 Migraine without aura, not intractable, without status migrainosus: Secondary | ICD-10-CM

## 2024-04-06 MED ORDER — AMITRIPTYLINE HCL 75 MG PO TABS: 75.0000 mg | ORAL_TABLET | Freq: Every day | ORAL | 0 refills | Status: AC

## 2024-04-06 NOTE — Telephone Encounter (Signed)
 Copied from CRM [id number]. Topic: Clinical - Medication Refill >> Apr 06, 2024  3:45 PM Darshell M wrote: Medication:  amitriptyline  (ELAVIL ) 75 MG tablet   Has the patient contacted their pharmacy? No (Agent: If no, request that the patient contact the pharmacy for the refill. If patient does not wish to contact the pharmacy document the reason why and proceed with request.) (Agent: If yes, when and what did the pharmacy advise?)  This is the patient's preferred pharmacy:   [facility] - [place], [place] - [address] [place] [address] Phone: [phone] Fax: [phone]  Is this the correct pharmacy for this prescription? Yes If no, delete pharmacy and type the correct one.   Has the prescription been filled recently? No  Is the patient out of the medication? Yes  Has the patient been seen for an appointment in the last year OR does the patient have an upcoming appointment? Yes  Can we respond through MyChart? Yes  Agent: Please be advised that Rx refills may take up to 3 business days. We ask that you follow-up with your pharmacy.

## 2024-04-08 NOTE — Progress Notes (Signed)
  Because we treated you a few weeks ago for the same ear pain and symptoms, I feel your condition warrants further evaluation and I recommend that you be seen in a face-to-face visit so someone can look in your ear and make sure it is not ruptured.    NOTE: There will be NO CHARGE for this E-Visit   If you are having a true medical emergency, please call 911.     For an urgent face to face visit, [facility] has multiple urgent care centers for your convenience.  Click the link below for the full list of locations and hours, walk-in wait times, appointment scheduling options and driving directions:  Urgent Care - [place], [place], [place], [place], [place], [place]  [place]     Your MyChart E-visit questionnaire answers were reviewed by a board certified advanced clinical practitioner to complete your personal care plan based on your specific symptoms.    Thank you for using e-Visits.

## 2024-04-23 DIAGNOSIS — J84116 Cryptogenic organizing pneumonia: Secondary | ICD-10-CM | POA: Diagnosis not present

## 2024-04-23 DIAGNOSIS — J9691 Respiratory failure, unspecified with hypoxia: Secondary | ICD-10-CM | POA: Diagnosis not present

## 2024-04-30 ENCOUNTER — Telehealth: Admitting: Nurse Practitioner

## 2024-04-30 DIAGNOSIS — B349 Viral infection, unspecified: Secondary | ICD-10-CM | POA: Diagnosis not present

## 2024-04-30 MED ORDER — PROMETHAZINE-DM 6.25-15 MG/5ML PO SYRP: 5.0000 mL | ORAL_SOLUTION | Freq: Four times a day (QID) | ORAL | 0 refills | Status: DC | PRN

## 2024-04-30 MED ORDER — LIDOCAINE VISCOUS HCL 2 % MT SOLN: 15.0000 mL | OROMUCOSAL | 0 refills | Status: AC | PRN

## 2024-04-30 NOTE — Progress Notes (Signed)
 E visit for Flu like symptoms   We are sorry that you are not feeling well.  Here is how we plan to help! Based on what you have shared with me it looks like you may have flu-like symptoms that should be watched but do not seem to indicate anti-viral treatment.  I do recommend you take a combined COVID/FLU test since you have a fever to see if you are positive.  Influenza or "the flu" is  an infection caused by a respiratory virus. The flu virus is highly contagious and persons who did not receive their yearly flu vaccination may "catch" the flu from close contact.  We have anti-viral medications to treat the viruses that cause this infection. They are not a "cure" and only shorten the course of the infection. These prescriptions are most effective when they are given within the first 2 days of "flu" symptoms. Antiviral medications are indicated if you have a high risk of complications from the flu. You should  also consider an antiviral medication if you are in close contact with someone who is at risk. These medications can help patients avoid complications from the flu but have side effects that you should know.   Possible side effects from Tamiflu or oseltamivir include nausea, vomiting, diarrhea, dizziness, headaches, eye redness, sleep problems or other respiratory symptoms. You should not take Tamiflu if you have an allergy to oseltamivir or any to the ingredients in Tamiflu.  Based upon your symptoms and potential risk factors I recommend motrin  and alternating tylenol  for your fever and sore throat. I have sent in a cough syrup for your cough.   For nasal congestion, you may use an oral decongestant such as Mucinex  D or if you have glaucoma or high blood pressure use plain Mucinex .  Saline nasal spray or nasal drops can help and can safely be used as often as needed for congestion.  If you have a sore or scratchy throat, use a saltwater gargle-  to  teaspoon of salt dissolved in a 4-ounce  to 8-ounce glass of warm water .  Gargle the solution for approximately 15-30 seconds and then spit.  It is important not to swallow the solution.  You can also use throat lozenges/cough drops and Chloraseptic spray to help with throat pain or discomfort.  Warm or cold liquids can also be helpful in relieving throat pain.  For headache, pain or general discomfort, you can use Ibuprofen  or Tylenol  as directed.   Some authorities believe that zinc sprays or the use of Echinacea may shorten the course of your symptoms.   You are to isolate at home until you have been fever-free for at least 24 hours without a fever-reducing medication, and symptoms have been steadily improving for 24 hours.  If you must be around other household members who do not have symptoms, you need to make sure that both you and the family members are masking consistently with a high-quality mask.  If you note any worsening of symptoms despite treatment, please seek an in-person evaluation ASAP. If you note any significant shortness of breath or any chest pain, please seek ED evaluation. Please do not delay care!  ANYONE WHO HAS FLU SYMPTOMS SHOULD: Stay home. The flu is highly contagious and going out or to work exposes others! Be sure to drink plenty of fluids. Water  is fine as well as fruit juices, sodas and electrolyte beverages. You may want to stay away from caffeine or alcohol. If you are nauseated, try  taking small sips of liquids. How do you know if you are getting enough fluid? Your urine should be a pale yellow or almost colorless. Get rest. Taking a steamy shower or using a humidifier may help nasal congestion and ease sore throat pain. Using a saline nasal spray works much the same way. Cough drops, hard candies and sore throat lozenges may ease your cough. Line up a caregiver. Have someone check on you regularly.  GET HELP RIGHT AWAY IF: You cannot keep down liquids or your medications. You become short of  breath Your fell like you are going to pass out or loose consciousness. Your symptoms persist after you have completed your treatment plan  MAKE SURE YOU  Understand these instructions. Will watch your condition. Will get help right away if you are not doing well or get worse.  Your e-visit answers were reviewed by a board certified advanced clinical practitioner to complete your personal care plan.  Depending on the condition, your plan could have included both over the counter or prescription medications.  If there is a problem please reply  once you have received a response from your provider.  Your safety is important to us .  If you have drug allergies check your prescription carefully.    You can use MyChart to ask questions about today's visit, request a non-urgent call back, or ask for a work or school excuse for 24 hours related to this e-Visit. If it has been greater than 24 hours you will need to follow up with your provider, or enter a new e-Visit to address those concerns.  You will get an e-mail in the next two days asking about your experience.  I hope that your e-visit has been valuable and will speed your recovery. Thank you for using e-visits.   I have spent 5 minutes in review of e-visit questionnaire, review and updating patient chart, medical decision making and response to patient.   Manie Bealer W Amdrew Oboyle, NP

## 2024-05-07 ENCOUNTER — Other Ambulatory Visit: Payer: Self-pay | Admitting: Medical Genetics

## 2024-05-07 DIAGNOSIS — Z006 Encounter for examination for normal comparison and control in clinical research program: Secondary | ICD-10-CM

## 2024-05-08 ENCOUNTER — Encounter: Payer: Self-pay | Admitting: Radiology

## 2024-05-22 ENCOUNTER — Encounter: Admitting: Internal Medicine

## 2024-05-24 ENCOUNTER — Telehealth: Admitting: Physician Assistant

## 2024-05-24 DIAGNOSIS — J84116 Cryptogenic organizing pneumonia: Secondary | ICD-10-CM | POA: Diagnosis not present

## 2024-05-24 DIAGNOSIS — J9691 Respiratory failure, unspecified with hypoxia: Secondary | ICD-10-CM | POA: Diagnosis not present

## 2024-05-24 DIAGNOSIS — R3989 Other symptoms and signs involving the genitourinary system: Secondary | ICD-10-CM | POA: Diagnosis not present

## 2024-05-24 MED ORDER — CEPHALEXIN 500 MG PO CAPS: 500.0000 mg | ORAL_CAPSULE | Freq: Two times a day (BID) | ORAL | 0 refills | Status: AC

## 2024-05-24 NOTE — Progress Notes (Signed)

## 2024-05-26 ENCOUNTER — Other Ambulatory Visit: Payer: Self-pay | Admitting: Family Medicine

## 2024-05-26 DIAGNOSIS — G43009 Migraine without aura, not intractable, without status migrainosus: Secondary | ICD-10-CM

## 2024-05-30 ENCOUNTER — Encounter: Payer: Self-pay | Admitting: Family Medicine

## 2024-07-04 DIAGNOSIS — M48062 Spinal stenosis, lumbar region with neurogenic claudication: Secondary | ICD-10-CM | POA: Diagnosis not present

## 2024-07-18 ENCOUNTER — Telehealth

## 2024-07-18 ENCOUNTER — Ambulatory Visit

## 2024-07-18 DIAGNOSIS — H9201 Otalgia, right ear: Secondary | ICD-10-CM

## 2024-07-18 DIAGNOSIS — M549 Dorsalgia, unspecified: Secondary | ICD-10-CM

## 2024-07-18 NOTE — Progress Notes (Unsigned)
 "   New patient visit   Patient: April Ayala   DOB: 05-Aug-1975   49 y.o. Female  MRN: [id number] Visit Date: 07/18/2024  Today's healthcare provider: Isaiah DELENA Pepper, MD   No chief complaint on file.  Subjective    April Ayala is a 49 y.o. female who presents today as a new patient to establish care.   Discussed the use of AI scribe software for clinical note transcription with the patient, who gave verbal consent to proceed.  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   Cervical cancer (HCC) 04/2011   Stage 1B squamous cell   Chronic left shoulder pain 10/07/2017   Chronic tension-type headache, not intractable 10/07/2017   Depression    High cholesterol    History of kidney stones    passed   Morbid obesity with BMI of 40.0-44.9, adult (HCC) 09/29/2019   Prehypertension 06/26/2020   S/P lumbar fusion 04/20/2018   Past Surgical History:  Procedure Laterality Date   ABDOMINAL HYSTERECTOMY     BREAST BIOPSY Right    CERVICAL CONIZATION W/BX  03/24/2012   Procedure: CONIZATION CERVIX WITH BIOPSY;  Surgeon: Lynwood KANDICE Solomons, MD;  Location: [facility];  Service: Gynecology;  Laterality: N/A;   DIAGNOSTIC LAPAROSCOPY  2007   ectopic   ECTOPIC PREGNANCY SURGERY     ESSURE TUBAL LIGATION  2010   INSERTION OF SUPRAPUBIC CATHETER  05/03/2012   Procedure: INSERTION OF SUPRAPUBIC CATHETER;  Surgeon: Toribio LITTIE Percy, MD;  Location: [facility];  Service: Gynecology;;   LEEP     LUMBAR LAMINECTOMY/DECOMPRESSION MICRODISCECTOMY N/A 04/20/2018   Procedure: LUMBAR FIVE TO SACRAL ONE DECOMPRESSION AND FUSION, POSSIBLE LUMBAR FOUR TO SACRAL ONE; SCREW AND CAGE INSTRUMENTATION;  Surgeon: Burnetta Aures, MD;  Location: [facility];  Service: Orthopedics;  Laterality: N/A;  5 hrs   LYMPHADENECTOMY  05/03/2012   Procedure:  LYMPHADENECTOMY;  Surgeon: Toribio LITTIE Percy, MD;  Location: [facility];  Service: Gynecology;  Laterality: Bilateral;  pelvic   RADICAL HYSTERECTOMY  05/03/2012   Procedure: RADICAL HYSTERECTOMY;  Surgeon: Toribio LITTIE Percy, MD;  Location: [facility];  Service: Gynecology;;  transposition of the ovaries   SPINE SURGERY     TUBAL LIGATION     Family Status  Relation Name Status   Mother  Deceased   Father  Alive   Sister  Alive   Sister  Alive   Daughter  Alive   Son  Alive   Daughter  Alive   Daughter  Alive   Sister  Alive  No partnership data on file   Family History  Problem Relation Age of Onset   Diabetes Mother    Hypertension Father    Diabetes Sister    Migraines Sister    Hypothyroidism Sister    Migraines Sister    Migraines Sister    Social History   Socioeconomic History   Marital status: Significant Other    Spouse name: Not on file   Number of children: 4   Years of education: Not on file   Highest education level: GED or equivalent  Occupational History   Occupation: statistician  Tobacco Use   Smoking status: Every Day    Current packs/day: 0.50    Average packs/day: 0.5 packs/day for 30.7 years (15.4 ttl pk-yrs)    Types: Cigarettes  Start date: 11/02/1993   Smokeless tobacco: Never   Tobacco comments:    1 pack/3 days  Vaping Use   Vaping status: Never Used  Substance and Sexual Activity   Alcohol use: Never   Drug use: Never   Sexual activity: Yes    Birth control/protection: Surgical    Comment: hyst  Other Topics Concern   Not on file  Social History Narrative   Lives with fiance and 1 daughter      1 son is with grandmother    Oldest daughter is 75 lives with grandfather      Works as a merchandiser, retail at the [employer].     Lives in [place], [place].      6 dogs, a  beard dragon, a snake, and a hamster and a cat      Enjoys: taking care of her pets      Diet: avoids lunch due to work, eats all food groups   Caffeine: monsters in  the morning-soda   Water : limited      Wears seat belt    Does not use phone while driving    Smoke detectors at home       Right handed   Social Drivers of Health   Tobacco Use: High Risk (02/09/2024)   Patient History    Smoking Tobacco Use: Every Day    Smokeless Tobacco Use: Never    Passive Exposure: Not on file  Financial Resource Strain: Low Risk (07/14/2024)   Overall Financial Resource Strain (CARDIA)    Difficulty of Paying Living Expenses: Not hard at all  Food Insecurity: No Food Insecurity (07/14/2024)   Epic    Worried About Radiation Protection Practitioner of Food in the Last Year: Never true    Ran Out of Food in the Last Year: Never true  Transportation Needs: No Transportation Needs (07/14/2024)   Epic    Lack of Transportation (Medical): No    Lack of Transportation (Non-Medical): No  Physical Activity: Inactive (07/14/2024)   Exercise Vital Sign    Days of Exercise per Week: 7 days    Minutes of Exercise per Session: 0 min  Stress: Stress Concern Present (07/14/2024)   [employer] of Occupational Health - Occupational Stress Questionnaire    Feeling of Stress: Rather much  Social Connections: Moderately Isolated (07/14/2024)   Social Connection and Isolation Panel    Frequency of Communication with Friends and Family: Never    Frequency of Social Gatherings with Friends and Family: More than three times a week    Attends Religious Services: Never    Database Administrator or Organizations: No    Attends Engineer, Structural: Not on file    Marital Status: Married  Depression (PHQ2-9): Low Risk (10/23/2021)   Depression (PHQ2-9)    PHQ-2 Score: 0  Alcohol Screen: Low Risk (07/14/2024)   Alcohol Screen    Last Alcohol Screening Score (AUDIT): 2  Housing: Unknown (07/14/2024)   Epic    Unable to Pay for Housing in the Last Year: No    Number of Times Moved in the Last Year: Not on file    Homeless in the Last Year: No  Utilities: Not At Risk (02/17/2023)   AHC Utilities     Threatened with loss of utilities: No  Health Literacy: Not on file   Show/hide medication list[1] Allergies[2]  Reviews of Systems as noted in HPI.  {Insert previous labs (optional):23779} {See past labs  Heme  Chem  Endocrine  Serology  Results Review (optional):1}   Objective    LMP 04/11/2012  {Insert last BP/Wt (optional):23777}{See vitals history (optional):1}   Physical Exam  Depression Screen    10/23/2021    2:36 PM 08/11/2021    9:01 AM 06/02/2021    1:30 PM 03/17/2021   11:18 AM  PHQ 2/9 Scores  PHQ - 2 Score 0 0 0 0  PHQ- 9 Score  0   0      Data saved with a previous flowsheet row definition   No results found for any visits on 07/18/24.  Assessment & Plan      Problem List Items Addressed This Visit   None   Assessment and Plan Assessment & Plan      No follow-ups on file.      Isaiah DELENA Pepper, MD  [facility] [phone] (phone) [phone] (fax)    [1]  Outpatient Medications Prior to Visit  Medication Sig   acyclovir  ointment (ZOVIRAX ) 5 % Apply three times daily to cold sore until resolution (Patient not taking: Reported on 09/10/2023)   amitriptyline  (ELAVIL ) 75 MG tablet Take 1 tablet (75 mg total) by mouth at bedtime.   DULoxetine  (CYMBALTA ) 30 MG capsule Take 1 capsule by mouth once daily   fluticasone  (FLONASE ) 50 MCG/ACT nasal spray Place 2 sprays into both nostrils daily.   Galcanezumab -gnlm (EMGALITY ) 120 MG/ML SOAJ INJECT 120 MG SUBCUTANEOUSLY EVERY 30 DAYS. MUST BE SEEN FOR FURTHER REFILLS. CALL [phone].   lidocaine  (XYLOCAINE ) 2 % solution Use as directed 15 mLs in the mouth or throat as needed for mouth pain.   methocarbamol  (ROBAXIN ) 750 MG tablet Take 750 mg by mouth in the morning and at bedtime.   omeprazole  (PRILOSEC) 40 MG capsule TAKE ONE CAPSULE BY MOUTH EVERY DAY   ondansetron  (ZOFRAN ) 4 MG tablet Take 1 tablet (4 mg total) by mouth every 8 (eight) hours as needed for  nausea or vomiting.   oxyCODONE -acetaminophen  (PERCOCET) 10-325 MG tablet Take 1 tablet by mouth every 4 (four) hours as needed for pain.   potassium chloride  SA (KLOR-CON  M) 20 MEQ tablet Take 1 tablet (20 mEq total) by mouth 2 (two) times daily.   Rimegepant Sulfate (NURTEC) 75 MG TBDP DISSOLVE 1 TABLET BY MOUTH FOR ACUTE MIGRAINE ATTACKS -  MAXIMUM  75  MG  PER  24  HOURS   rosuvastatin  (CRESTOR ) 5 MG tablet TAKE ONE TABLET BY MOUTH EVERY DAY   valACYclovir  (VALTREX ) 1000 MG tablet Take 4,000 mg by mouth once.   No facility-administered medications prior to visit.  [2]  Allergies Allergen Reactions   Bee Venom Anaphylaxis   Coconut Oil Hives, Itching and Swelling   "

## 2024-07-19 NOTE — Progress Notes (Signed)
" °  Because of having severe ear pain, I feel your condition warrants further evaluation and I recommend that you be seen in a face-to-face visit for a thorough ear exam.   NOTE: There will be NO CHARGE for this E-Visit   If you are having a true medical emergency, please call 911.     For an urgent face to face visit, [facility] has multiple urgent care centers for your convenience.  Click the link below for the full list of locations and hours, walk-in wait times, appointment scheduling options and driving directions:  Urgent Care - [facility], [place], [place], [place], [place], [place]       Your MyChart E-visit questionnaire answers were reviewed by a board certified advanced clinical practitioner to complete your personal care plan based on your specific symptoms.    Thank you for using e-Visits.    "

## 2024-07-19 NOTE — Progress Notes (Signed)
" °  Because you mention having 10/10 pain that is physically restricting you, I feel your condition warrants further evaluation and I recommend that you be seen in a face-to-face visit.   NOTE: There will be NO CHARGE for this E-Visit   If you are having a true medical emergency, please call 911.     For an urgent face to face visit, [facility] has multiple urgent care centers for your convenience.  Click the link below for the full list of locations and hours, walk-in wait times, appointment scheduling options and driving directions:  Urgent Care - [place], [place], [place], [place], [place], [place]  [place]     Your MyChart E-visit questionnaire answers were reviewed by a board certified advanced clinical practitioner to complete your personal care plan based on your specific symptoms.    Thank you for using e-Visits.    "

## 2024-08-02 ENCOUNTER — Telehealth: Admitting: Emergency Medicine

## 2024-08-02 DIAGNOSIS — K121 Other forms of stomatitis: Secondary | ICD-10-CM

## 2024-08-02 NOTE — Progress Notes (Signed)
 See if you can get the last appt for tonight quickly! At 7:45pm.   Thank you for the details you included in the comment boxes. Those details are very helpful in determining the best course of treatment for you and help us  to provide the best care.  Because your symptoms are a little different than typical ulcers, we recommend that you schedule a Virtual Urgent Care video visit in order for the provider to better assess what is going on.  The provider will be able to give you a more accurate diagnosis and treatment plan if we can more freely discuss your symptoms and with the addition of a virtual examination.   If you change your visit to a video visit, we will bill your insurance (similar to an office visit) and you will not be charged for this e-Visit. You will be able to stay at home and speak with the first available [facility] advanced practice provider. The link to do a video visit is in the drop down Menu tab of your Welcome screen in MyChart.

## 2024-09-21 ENCOUNTER — Ambulatory Visit
